# Patient Record
Sex: Female | Born: 1960
Health system: Southern US, Community
[De-identification: ages and names within clinical notes are randomized; demographics above are authoritative.]

## PROBLEM LIST (undated history)

## (undated) DIAGNOSIS — F419 Anxiety disorder, unspecified: Secondary | ICD-10-CM

## (undated) DIAGNOSIS — D219 Benign neoplasm of connective and other soft tissue, unspecified: Secondary | ICD-10-CM

## (undated) DIAGNOSIS — F329 Major depressive disorder, single episode, unspecified: Secondary | ICD-10-CM

## (undated) DIAGNOSIS — M1990 Unspecified osteoarthritis, unspecified site: Secondary | ICD-10-CM

## (undated) DIAGNOSIS — F32A Depression, unspecified: Secondary | ICD-10-CM

## (undated) DIAGNOSIS — R519 Headache, unspecified: Secondary | ICD-10-CM

## (undated) DIAGNOSIS — E669 Obesity, unspecified: Secondary | ICD-10-CM

## (undated) DIAGNOSIS — N189 Chronic kidney disease, unspecified: Secondary | ICD-10-CM

## (undated) DIAGNOSIS — E785 Hyperlipidemia, unspecified: Secondary | ICD-10-CM

## (undated) DIAGNOSIS — J45909 Unspecified asthma, uncomplicated: Secondary | ICD-10-CM

## (undated) DIAGNOSIS — K219 Gastro-esophageal reflux disease without esophagitis: Secondary | ICD-10-CM

## (undated) DIAGNOSIS — A599 Trichomoniasis, unspecified: Secondary | ICD-10-CM

## (undated) DIAGNOSIS — G629 Polyneuropathy, unspecified: Secondary | ICD-10-CM

## (undated) DIAGNOSIS — R109 Unspecified abdominal pain: Secondary | ICD-10-CM

## (undated) DIAGNOSIS — K589 Irritable bowel syndrome without diarrhea: Secondary | ICD-10-CM

## (undated) DIAGNOSIS — A749 Chlamydial infection, unspecified: Secondary | ICD-10-CM

## (undated) DIAGNOSIS — D126 Benign neoplasm of colon, unspecified: Secondary | ICD-10-CM

## (undated) DIAGNOSIS — E119 Type 2 diabetes mellitus without complications: Secondary | ICD-10-CM

## (undated) HISTORY — DX: Unspecified asthma, uncomplicated: J45.909

## (undated) HISTORY — PX: TUBAL LIGATION: SHX77

## (undated) HISTORY — DX: Gastro-esophageal reflux disease without esophagitis: K21.9

## (undated) HISTORY — DX: Morbid (severe) obesity due to excess calories: E66.01

## (undated) HISTORY — DX: Obesity, unspecified: E66.9

## (undated) HISTORY — DX: Irritable bowel syndrome, unspecified: K58.9

## (undated) HISTORY — DX: Hyperlipidemia, unspecified: E78.5

## (undated) HISTORY — DX: Trichomoniasis, unspecified: A59.9

## (undated) HISTORY — DX: Unspecified osteoarthritis, unspecified site: M19.90

## (undated) HISTORY — PX: UPPER GASTROINTESTINAL ENDOSCOPY: SHX188

## (undated) HISTORY — DX: Benign neoplasm of colon, unspecified: D12.6

## (undated) HISTORY — DX: Chlamydial infection, unspecified: A74.9

## (undated) HISTORY — DX: Unspecified abdominal pain: R10.9

---
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
## 1988-03-24 HISTORY — PX: COLPOSCOPY: SHX161

## 1997-03-24 DIAGNOSIS — K7689 Other specified diseases of liver: Secondary | ICD-10-CM

## 1997-03-24 HISTORY — DX: Other specified diseases of liver: K76.89

## 1998-01-24 ENCOUNTER — Inpatient Hospital Stay (HOSPITAL_COMMUNITY): Admission: AD | Admit: 1998-01-24 | Discharge: 1998-01-26 | Payer: Self-pay | Admitting: Specialist

## 1998-03-15 ENCOUNTER — Ambulatory Visit (HOSPITAL_COMMUNITY): Admission: RE | Admit: 1998-03-15 | Discharge: 1998-03-15 | Payer: Self-pay | Admitting: Gastroenterology

## 1998-03-15 ENCOUNTER — Encounter: Payer: Self-pay | Admitting: Gastroenterology

## 1998-06-27 ENCOUNTER — Other Ambulatory Visit: Admission: RE | Admit: 1998-06-27 | Discharge: 1998-06-27 | Payer: Self-pay | Admitting: Obstetrics and Gynecology

## 1998-07-19 ENCOUNTER — Encounter: Payer: Self-pay | Admitting: Gastroenterology

## 1998-07-19 ENCOUNTER — Ambulatory Visit (HOSPITAL_COMMUNITY): Admission: RE | Admit: 1998-07-19 | Discharge: 1998-07-19 | Payer: Self-pay | Admitting: Gastroenterology

## 1999-03-11 ENCOUNTER — Ambulatory Visit (HOSPITAL_COMMUNITY): Admission: RE | Admit: 1999-03-11 | Discharge: 1999-03-11 | Payer: Self-pay | Admitting: Gastroenterology

## 1999-03-11 ENCOUNTER — Encounter: Payer: Self-pay | Admitting: Gastroenterology

## 2000-04-13 ENCOUNTER — Ambulatory Visit (HOSPITAL_COMMUNITY): Admission: RE | Admit: 2000-04-13 | Discharge: 2000-04-13 | Payer: Self-pay | Admitting: Gastroenterology

## 2000-04-13 ENCOUNTER — Encounter: Payer: Self-pay | Admitting: Gastroenterology

## 2000-06-29 ENCOUNTER — Other Ambulatory Visit: Admission: RE | Admit: 2000-06-29 | Discharge: 2000-06-29 | Payer: Self-pay | Admitting: Obstetrics and Gynecology

## 2001-11-23 ENCOUNTER — Other Ambulatory Visit: Admission: RE | Admit: 2001-11-23 | Discharge: 2001-11-23 | Payer: Self-pay | Admitting: Obstetrics and Gynecology

## 2001-12-21 ENCOUNTER — Encounter: Payer: Self-pay | Admitting: Obstetrics and Gynecology

## 2001-12-21 ENCOUNTER — Ambulatory Visit (HOSPITAL_COMMUNITY): Admission: RE | Admit: 2001-12-21 | Discharge: 2001-12-21 | Payer: Self-pay | Admitting: Obstetrics and Gynecology

## 2002-08-02 ENCOUNTER — Ambulatory Visit: Admission: RE | Admit: 2002-08-02 | Discharge: 2002-08-02 | Payer: Self-pay | Admitting: Gastroenterology

## 2002-08-02 ENCOUNTER — Encounter: Payer: Self-pay | Admitting: Gastroenterology

## 2002-08-02 DIAGNOSIS — K769 Liver disease, unspecified: Secondary | ICD-10-CM | POA: Insufficient documentation

## 2004-01-17 ENCOUNTER — Ambulatory Visit: Payer: Self-pay | Admitting: Internal Medicine

## 2004-01-29 ENCOUNTER — Ambulatory Visit: Payer: Self-pay | Admitting: Internal Medicine

## 2004-02-27 ENCOUNTER — Ambulatory Visit: Payer: Self-pay | Admitting: Internal Medicine

## 2004-03-04 ENCOUNTER — Ambulatory Visit: Payer: Self-pay | Admitting: *Deleted

## 2004-04-04 ENCOUNTER — Ambulatory Visit: Payer: Self-pay | Admitting: Internal Medicine

## 2004-04-29 ENCOUNTER — Ambulatory Visit: Payer: Self-pay | Admitting: Internal Medicine

## 2004-05-08 ENCOUNTER — Ambulatory Visit: Payer: Self-pay | Admitting: Internal Medicine

## 2004-05-13 ENCOUNTER — Ambulatory Visit: Payer: Self-pay | Admitting: Internal Medicine

## 2004-05-20 ENCOUNTER — Ambulatory Visit: Payer: Self-pay | Admitting: Internal Medicine

## 2004-07-16 ENCOUNTER — Ambulatory Visit: Payer: Self-pay | Admitting: Internal Medicine

## 2004-10-23 ENCOUNTER — Ambulatory Visit: Payer: Self-pay | Admitting: Internal Medicine

## 2004-12-06 ENCOUNTER — Ambulatory Visit (HOSPITAL_COMMUNITY): Admission: RE | Admit: 2004-12-06 | Discharge: 2004-12-06 | Payer: Self-pay | Admitting: Internal Medicine

## 2005-01-30 ENCOUNTER — Ambulatory Visit: Payer: Self-pay | Admitting: Internal Medicine

## 2005-04-22 ENCOUNTER — Emergency Department (HOSPITAL_COMMUNITY): Admission: EM | Admit: 2005-04-22 | Discharge: 2005-04-22 | Payer: Self-pay | Admitting: Family Medicine

## 2005-07-08 ENCOUNTER — Encounter: Admission: RE | Admit: 2005-07-08 | Discharge: 2005-07-08 | Payer: Self-pay | Admitting: Occupational Medicine

## 2005-08-01 ENCOUNTER — Ambulatory Visit: Payer: Self-pay | Admitting: Internal Medicine

## 2005-08-12 ENCOUNTER — Ambulatory Visit: Payer: Self-pay | Admitting: Gastroenterology

## 2005-09-01 ENCOUNTER — Encounter (INDEPENDENT_AMBULATORY_CARE_PROVIDER_SITE_OTHER): Payer: Self-pay | Admitting: Internal Medicine

## 2005-09-01 ENCOUNTER — Ambulatory Visit: Payer: Self-pay | Admitting: Internal Medicine

## 2005-09-01 DIAGNOSIS — E7800 Pure hypercholesterolemia, unspecified: Secondary | ICD-10-CM | POA: Insufficient documentation

## 2005-09-01 LAB — CONVERTED CEMR LAB
Cholesterol: 202 mg/dL
Creatinine, Ser: 0.88 mg/dL
HDL: 65 mg/dL
Hemoglobin: 12.9 g/dL
LDL Cholesterol: 120 mg/dL
Triglycerides: 85 mg/dL

## 2005-09-17 ENCOUNTER — Ambulatory Visit: Payer: Self-pay | Admitting: Gastroenterology

## 2005-09-19 ENCOUNTER — Encounter: Payer: Self-pay | Admitting: Internal Medicine

## 2005-09-19 ENCOUNTER — Other Ambulatory Visit: Admission: RE | Admit: 2005-09-19 | Discharge: 2005-09-19 | Payer: Self-pay | Admitting: Internal Medicine

## 2005-09-19 ENCOUNTER — Ambulatory Visit: Payer: Self-pay | Admitting: Internal Medicine

## 2005-09-19 ENCOUNTER — Encounter (INDEPENDENT_AMBULATORY_CARE_PROVIDER_SITE_OTHER): Payer: Self-pay | Admitting: Internal Medicine

## 2005-09-19 LAB — CONVERTED CEMR LAB: Pap Smear: NORMAL

## 2005-10-13 ENCOUNTER — Ambulatory Visit: Payer: Self-pay | Admitting: Internal Medicine

## 2006-02-27 ENCOUNTER — Ambulatory Visit: Payer: Self-pay | Admitting: Internal Medicine

## 2006-05-02 ENCOUNTER — Emergency Department (HOSPITAL_COMMUNITY): Admission: EM | Admit: 2006-05-02 | Discharge: 2006-05-02 | Payer: Self-pay | Admitting: Family Medicine

## 2006-05-04 ENCOUNTER — Ambulatory Visit: Payer: Self-pay | Admitting: Internal Medicine

## 2006-08-25 ENCOUNTER — Ambulatory Visit: Payer: Self-pay | Admitting: Internal Medicine

## 2006-10-29 ENCOUNTER — Encounter (INDEPENDENT_AMBULATORY_CARE_PROVIDER_SITE_OTHER): Payer: Self-pay | Admitting: Internal Medicine

## 2006-10-29 DIAGNOSIS — F329 Major depressive disorder, single episode, unspecified: Secondary | ICD-10-CM | POA: Insufficient documentation

## 2006-10-29 DIAGNOSIS — F32A Depression, unspecified: Secondary | ICD-10-CM | POA: Insufficient documentation

## 2006-10-29 DIAGNOSIS — J309 Allergic rhinitis, unspecified: Secondary | ICD-10-CM | POA: Insufficient documentation

## 2006-10-29 DIAGNOSIS — K589 Irritable bowel syndrome without diarrhea: Secondary | ICD-10-CM | POA: Insufficient documentation

## 2006-10-29 DIAGNOSIS — F419 Anxiety disorder, unspecified: Secondary | ICD-10-CM | POA: Insufficient documentation

## 2006-10-29 DIAGNOSIS — F3289 Other specified depressive episodes: Secondary | ICD-10-CM | POA: Insufficient documentation

## 2006-11-30 ENCOUNTER — Telehealth (INDEPENDENT_AMBULATORY_CARE_PROVIDER_SITE_OTHER): Payer: Self-pay | Admitting: *Deleted

## 2006-12-09 ENCOUNTER — Encounter (INDEPENDENT_AMBULATORY_CARE_PROVIDER_SITE_OTHER): Payer: Self-pay | Admitting: *Deleted

## 2007-01-12 ENCOUNTER — Telehealth (INDEPENDENT_AMBULATORY_CARE_PROVIDER_SITE_OTHER): Payer: Self-pay | Admitting: *Deleted

## 2007-01-18 ENCOUNTER — Encounter (INDEPENDENT_AMBULATORY_CARE_PROVIDER_SITE_OTHER): Payer: Self-pay | Admitting: Internal Medicine

## 2007-02-02 ENCOUNTER — Ambulatory Visit: Payer: Self-pay | Admitting: Nurse Practitioner

## 2007-07-19 ENCOUNTER — Telehealth (INDEPENDENT_AMBULATORY_CARE_PROVIDER_SITE_OTHER): Payer: Self-pay | Admitting: Internal Medicine

## 2007-07-20 ENCOUNTER — Emergency Department (HOSPITAL_COMMUNITY): Admission: EM | Admit: 2007-07-20 | Discharge: 2007-07-20 | Payer: Self-pay | Admitting: Family Medicine

## 2007-07-27 ENCOUNTER — Ambulatory Visit: Payer: Self-pay | Admitting: Internal Medicine

## 2007-07-27 DIAGNOSIS — F319 Bipolar disorder, unspecified: Secondary | ICD-10-CM | POA: Insufficient documentation

## 2007-07-30 ENCOUNTER — Encounter (INDEPENDENT_AMBULATORY_CARE_PROVIDER_SITE_OTHER): Payer: Self-pay | Admitting: Internal Medicine

## 2007-08-03 ENCOUNTER — Telehealth (INDEPENDENT_AMBULATORY_CARE_PROVIDER_SITE_OTHER): Payer: Self-pay | Admitting: Internal Medicine

## 2007-09-03 ENCOUNTER — Ambulatory Visit: Payer: Self-pay | Admitting: Internal Medicine

## 2007-09-21 ENCOUNTER — Ambulatory Visit: Payer: Self-pay | Admitting: Internal Medicine

## 2008-08-30 ENCOUNTER — Ambulatory Visit: Payer: Self-pay | Admitting: Family Medicine

## 2008-08-30 DIAGNOSIS — K219 Gastro-esophageal reflux disease without esophagitis: Secondary | ICD-10-CM | POA: Insufficient documentation

## 2008-08-30 DIAGNOSIS — R42 Dizziness and giddiness: Secondary | ICD-10-CM | POA: Insufficient documentation

## 2008-10-13 ENCOUNTER — Ambulatory Visit: Payer: Self-pay | Admitting: Internal Medicine

## 2008-10-26 ENCOUNTER — Ambulatory Visit: Payer: Self-pay | Admitting: Internal Medicine

## 2008-10-26 LAB — CONVERTED CEMR LAB
ALT: <8 units/L (ref 0–35)
AST: 11 units/L (ref 0–37)
Albumin: 4.1 g/dL (ref 3.5–5.2)
Alkaline Phosphatase: 57 units/L (ref 39–117)
BUN: 13 mg/dL (ref 6–23)
Basophils Absolute: 0 10*3/uL (ref 0.0–0.1)
Basophils Relative: 0 % (ref 0–1)
CO2: 23 meq/L (ref 19–32)
Calcium: 9 mg/dL (ref 8.4–10.5)
Chloride: 104 meq/L (ref 96–112)
Cholesterol: 200 mg/dL (ref 0–200)
Creatinine, Ser: 0.77 mg/dL (ref 0.40–1.20)
Eosinophils Absolute: 0.1 10*3/uL (ref 0.0–0.7)
Eosinophils Relative: 2 % (ref 0–5)
Glucose, Bld: 99 mg/dL (ref 70–99)
HCT: 37.3 % (ref 36.0–46.0)
HDL: 61 mg/dL (ref >39)
Hemoglobin: 11.7 g/dL — ABNORMAL LOW (ref 12.0–15.0)
LDL Cholesterol: 119 mg/dL — ABNORMAL HIGH (ref 0–99)
Lymphocytes Relative: 46 % (ref 12–46)
Lymphs Abs: 2.3 10*3/uL (ref 0.7–4.0)
MCHC: 31.4 g/dL (ref 30.0–36.0)
MCV: 78.2 fL (ref 78.0–100.0)
Monocytes Absolute: 0.3 10*3/uL (ref 0.1–1.0)
Monocytes Relative: 5 % (ref 3–12)
Neutro Abs: 2.3 10*3/uL (ref 1.7–7.7)
Neutrophils Relative %: 47 % (ref 43–77)
Platelets: 233 10*3/uL (ref 150–400)
Potassium: 4 meq/L (ref 3.5–5.3)
RBC: 4.77 M/uL (ref 3.87–5.11)
RDW: 15.4 % (ref 11.5–15.5)
Sodium: 139 meq/L (ref 135–145)
TSH: 1.472 microintl units/mL (ref 0.350–4.500)
Total Bilirubin: 0.2 mg/dL — ABNORMAL LOW (ref 0.3–1.2)
Total CHOL/HDL Ratio: 3.3
Total Protein: 7.1 g/dL (ref 6.0–8.3)
Triglycerides: 98 mg/dL (ref <150)
VLDL: 20 mg/dL (ref 0–40)
WBC: 4.9 10*3/uL (ref 4.0–10.5)

## 2008-11-16 ENCOUNTER — Encounter (INDEPENDENT_AMBULATORY_CARE_PROVIDER_SITE_OTHER): Payer: Self-pay | Admitting: Internal Medicine

## 2008-11-16 DIAGNOSIS — D649 Anemia, unspecified: Secondary | ICD-10-CM | POA: Insufficient documentation

## 2009-02-06 ENCOUNTER — Ambulatory Visit: Payer: Self-pay | Admitting: Internal Medicine

## 2009-02-06 ENCOUNTER — Encounter (INDEPENDENT_AMBULATORY_CARE_PROVIDER_SITE_OTHER): Payer: Self-pay | Admitting: Internal Medicine

## 2009-02-06 DIAGNOSIS — G4700 Insomnia, unspecified: Secondary | ICD-10-CM | POA: Insufficient documentation

## 2009-02-06 DIAGNOSIS — A5901 Trichomonal vulvovaginitis: Secondary | ICD-10-CM | POA: Insufficient documentation

## 2009-02-06 LAB — CONVERTED CEMR LAB
Basophils Absolute: 0 10*3/uL (ref 0.0–0.1)
Basophils Relative: 1 % (ref 0–1)
Bilirubin Urine: NEGATIVE
Blood in Urine, dipstick: NEGATIVE
Chlamydia, DNA Probe: NEGATIVE
Eosinophils Absolute: 0.1 10*3/uL (ref 0.0–0.7)
Eosinophils Relative: 1 % (ref 0–5)
GC Probe Amp, Genital: NEGATIVE
Glucose, Urine, Semiquant: NEGATIVE
HCT: 37.9 % (ref 36.0–46.0)
Hemoglobin: 12.3 g/dL (ref 12.0–15.0)
Ketones, urine, test strip: NEGATIVE
Lymphocytes Relative: 46 % (ref 12–46)
Lymphs Abs: 3 10*3/uL (ref 0.7–4.0)
MCHC: 32.5 g/dL (ref 30.0–36.0)
MCV: 76.3 fL — ABNORMAL LOW (ref 78.0–100.0)
Monocytes Absolute: 0.5 10*3/uL (ref 0.1–1.0)
Monocytes Relative: 8 % (ref 3–12)
Neutro Abs: 2.9 10*3/uL (ref 1.7–7.7)
Neutrophils Relative %: 45 % (ref 43–77)
Nitrite: NEGATIVE
Platelets: 275 10*3/uL (ref 150–400)
RBC: 4.97 M/uL (ref 3.87–5.11)
RDW: 15.5 % (ref 11.5–15.5)
Rapid HIV Screen: NEGATIVE
Specific Gravity, Urine: 1.01
Urobilinogen, UA: 0.2
WBC: 6.4 10*3/uL (ref 4.0–10.5)
pH: 5.5

## 2009-02-17 ENCOUNTER — Emergency Department (HOSPITAL_COMMUNITY): Admission: EM | Admit: 2009-02-17 | Discharge: 2009-02-17 | Payer: Self-pay | Admitting: Emergency Medicine

## 2009-03-05 ENCOUNTER — Encounter (INDEPENDENT_AMBULATORY_CARE_PROVIDER_SITE_OTHER): Payer: Self-pay | Admitting: Internal Medicine

## 2009-03-08 ENCOUNTER — Telehealth (INDEPENDENT_AMBULATORY_CARE_PROVIDER_SITE_OTHER): Payer: Self-pay | Admitting: Internal Medicine

## 2009-03-19 ENCOUNTER — Telehealth (INDEPENDENT_AMBULATORY_CARE_PROVIDER_SITE_OTHER): Payer: Self-pay | Admitting: Internal Medicine

## 2009-06-19 ENCOUNTER — Encounter (INDEPENDENT_AMBULATORY_CARE_PROVIDER_SITE_OTHER): Payer: Self-pay | Admitting: Internal Medicine

## 2009-07-20 ENCOUNTER — Ambulatory Visit: Payer: Self-pay | Admitting: Internal Medicine

## 2009-07-20 ENCOUNTER — Telehealth (INDEPENDENT_AMBULATORY_CARE_PROVIDER_SITE_OTHER): Payer: Self-pay | Admitting: Internal Medicine

## 2009-07-20 DIAGNOSIS — R599 Enlarged lymph nodes, unspecified: Secondary | ICD-10-CM | POA: Insufficient documentation

## 2009-07-20 DIAGNOSIS — M25569 Pain in unspecified knee: Secondary | ICD-10-CM | POA: Insufficient documentation

## 2009-07-20 DIAGNOSIS — H6980 Other specified disorders of Eustachian tube, unspecified ear: Secondary | ICD-10-CM | POA: Insufficient documentation

## 2009-07-31 ENCOUNTER — Ambulatory Visit (HOSPITAL_COMMUNITY): Admission: RE | Admit: 2009-07-31 | Discharge: 2009-07-31 | Payer: Self-pay | Admitting: Internal Medicine

## 2009-08-06 ENCOUNTER — Encounter (INDEPENDENT_AMBULATORY_CARE_PROVIDER_SITE_OTHER): Payer: Self-pay | Admitting: Internal Medicine

## 2009-08-06 ENCOUNTER — Telehealth (INDEPENDENT_AMBULATORY_CARE_PROVIDER_SITE_OTHER): Payer: Self-pay | Admitting: Internal Medicine

## 2009-08-08 ENCOUNTER — Encounter (INDEPENDENT_AMBULATORY_CARE_PROVIDER_SITE_OTHER): Payer: Self-pay | Admitting: *Deleted

## 2009-08-15 ENCOUNTER — Telehealth (INDEPENDENT_AMBULATORY_CARE_PROVIDER_SITE_OTHER): Payer: Self-pay | Admitting: Internal Medicine

## 2009-10-19 ENCOUNTER — Encounter (INDEPENDENT_AMBULATORY_CARE_PROVIDER_SITE_OTHER): Payer: Self-pay | Admitting: Internal Medicine

## 2009-11-01 ENCOUNTER — Encounter (INDEPENDENT_AMBULATORY_CARE_PROVIDER_SITE_OTHER): Payer: Self-pay | Admitting: Internal Medicine

## 2009-12-20 ENCOUNTER — Telehealth (INDEPENDENT_AMBULATORY_CARE_PROVIDER_SITE_OTHER): Payer: Self-pay | Admitting: Internal Medicine

## 2010-01-04 ENCOUNTER — Ambulatory Visit: Payer: Self-pay | Admitting: Internal Medicine

## 2010-01-04 DIAGNOSIS — J019 Acute sinusitis, unspecified: Secondary | ICD-10-CM | POA: Insufficient documentation

## 2010-01-04 LAB — CONVERTED CEMR LAB
BUN: 11 mg/dL (ref 6–23)
CO2: 28 meq/L (ref 19–32)
Calcium: 9.8 mg/dL (ref 8.4–10.5)
Chloride: 103 meq/L (ref 96–112)
Creatinine, Ser: 0.85 mg/dL (ref 0.40–1.20)
Glucose, Bld: 106 mg/dL — ABNORMAL HIGH (ref 70–99)
Potassium: 4.4 meq/L (ref 3.5–5.3)
Sodium: 140 meq/L (ref 135–145)

## 2010-01-11 ENCOUNTER — Ambulatory Visit: Payer: Self-pay | Admitting: Internal Medicine

## 2010-01-11 ENCOUNTER — Telehealth (INDEPENDENT_AMBULATORY_CARE_PROVIDER_SITE_OTHER): Payer: Self-pay | Admitting: Internal Medicine

## 2010-01-11 DIAGNOSIS — L293 Anogenital pruritus, unspecified: Secondary | ICD-10-CM | POA: Insufficient documentation

## 2010-01-11 LAB — CONVERTED CEMR LAB
Bilirubin Urine: NEGATIVE
Blood in Urine, dipstick: NEGATIVE
Chlamydia, DNA Probe: NEGATIVE
GC Probe Amp, Genital: NEGATIVE
Glucose, Urine, Semiquant: NEGATIVE
KOH Prep: NEGATIVE
Nitrite: NEGATIVE
Specific Gravity, Urine: >=1.03
Urobilinogen, UA: 0.2
WBC Urine, dipstick: NEGATIVE
Whiff Test: NEGATIVE
pH: 5.5

## 2010-01-26 ENCOUNTER — Encounter (INDEPENDENT_AMBULATORY_CARE_PROVIDER_SITE_OTHER): Payer: Self-pay | Admitting: Internal Medicine

## 2010-02-11 ENCOUNTER — Ambulatory Visit: Payer: Self-pay | Admitting: Internal Medicine

## 2010-02-18 ENCOUNTER — Ambulatory Visit: Payer: Self-pay | Admitting: Obstetrics & Gynecology

## 2010-02-18 LAB — CONVERTED CEMR LAB
TSH: 2.231 microintl units/mL (ref 0.350–4.500)
Trich, Wet Prep: NONE SEEN

## 2010-03-01 ENCOUNTER — Ambulatory Visit: Payer: Self-pay | Admitting: Nurse Practitioner

## 2010-03-13 ENCOUNTER — Emergency Department (HOSPITAL_COMMUNITY)
Admission: EM | Admit: 2010-03-13 | Discharge: 2010-03-13 | Payer: Self-pay | Source: Home / Self Care | Admitting: Emergency Medicine

## 2010-03-15 ENCOUNTER — Ambulatory Visit (HOSPITAL_COMMUNITY)
Admission: RE | Admit: 2010-03-15 | Discharge: 2010-03-15 | Payer: Self-pay | Source: Home / Self Care | Attending: Family Medicine | Admitting: Family Medicine

## 2010-03-25 ENCOUNTER — Ambulatory Visit (HOSPITAL_COMMUNITY)
Admission: RE | Admit: 2010-03-25 | Discharge: 2010-03-25 | Payer: Self-pay | Source: Home / Self Care | Attending: Obstetrics & Gynecology | Admitting: Obstetrics & Gynecology

## 2010-04-04 ENCOUNTER — Observation Stay (HOSPITAL_COMMUNITY)
Admission: EM | Admit: 2010-04-04 | Discharge: 2010-04-04 | Payer: Self-pay | Source: Home / Self Care | Admitting: Emergency Medicine

## 2010-04-04 ENCOUNTER — Encounter (INDEPENDENT_AMBULATORY_CARE_PROVIDER_SITE_OTHER): Payer: Self-pay | Admitting: Emergency Medicine

## 2010-04-08 LAB — HEPATIC FUNCTION PANEL
ALT: 10 U/L (ref 0–35)
AST: 21 U/L (ref 0–37)
Albumin: 3.7 g/dL (ref 3.5–5.2)
Alkaline Phosphatase: 58 U/L (ref 39–117)
Bilirubin, Direct: 0.2 mg/dL (ref 0.0–0.3)
Indirect Bilirubin: 0.4 mg/dL (ref 0.3–0.9)
Total Bilirubin: 0.6 mg/dL (ref 0.3–1.2)
Total Protein: 7.3 g/dL (ref 6.0–8.3)

## 2010-04-08 LAB — POCT CARDIAC MARKERS
CKMB, poc: <1 ng/mL — ABNORMAL LOW (ref 1.0–8.0)
Myoglobin, poc: 70 ng/mL (ref 12–200)
Troponin i, poc: <0.05 ng/mL (ref 0.00–0.09)

## 2010-04-08 LAB — BASIC METABOLIC PANEL
BUN: 6 mg/dL (ref 6–23)
CO2: 26 mEq/L (ref 19–32)
Calcium: 8.9 mg/dL (ref 8.4–10.5)
Chloride: 102 mEq/L (ref 96–112)
Creatinine, Ser: 0.93 mg/dL (ref 0.4–1.2)
GFR calc Af Amer: >60 mL/min (ref >60)
GFR calc non Af Amer: >60 mL/min (ref >60)
Glucose, Bld: 106 mg/dL — ABNORMAL HIGH (ref 70–99)
Potassium: 3.8 mEq/L (ref 3.5–5.1)
Sodium: 138 mEq/L (ref 135–145)

## 2010-04-08 LAB — CBC
HCT: 37.1 % (ref 36.0–46.0)
Hemoglobin: 12.3 g/dL (ref 12.0–15.0)
MCH: 25.7 pg — ABNORMAL LOW (ref 26.0–34.0)
MCHC: 33.2 g/dL (ref 30.0–36.0)
MCV: 77.6 fL — ABNORMAL LOW (ref 78.0–100.0)
Platelets: 250 10*3/uL (ref 150–400)
RBC: 4.78 MIL/uL (ref 3.87–5.11)
RDW: 14.3 % (ref 11.5–15.5)
WBC: 8.2 10*3/uL (ref 4.0–10.5)

## 2010-04-08 LAB — CK TOTAL AND CKMB (NOT AT ARMC)
CK, MB: 0.7 ng/mL (ref 0.3–4.0)
CK, MB: 0.7 ng/mL (ref 0.3–4.0)
Relative Index: INVALID (ref 0.0–2.5)
Relative Index: INVALID (ref 0.0–2.5)
Total CK: 64 U/L (ref 7–177)
Total CK: 65 U/L (ref 7–177)

## 2010-04-08 LAB — LIPASE, BLOOD: Lipase: 27 U/L (ref 11–59)

## 2010-04-08 LAB — TROPONIN I
Troponin I: 0.02 ng/mL (ref 0.00–0.06)
Troponin I: 0.03 ng/mL (ref 0.00–0.06)

## 2010-04-15 ENCOUNTER — Encounter: Payer: Self-pay | Admitting: Internal Medicine

## 2010-04-23 NOTE — Letter (Signed)
Summary: Generic Letter  [facility]  [address] [place], [place] [address]   Phone: [phone]  Fax: [phone]    08/08/2009  [name] [address] [place], [place]  [address]  Dear Ms. Bruening,  We have been unable to reach you by telephone.  In order to set up an appointment with the ENT, we need you to provide a working telephone number to our referral coordinator.    Please call Candi Leash at [phone].  Thank you for your attention to this matter.  Sincerely,   Dutch Quint RN

## 2010-04-23 NOTE — Assessment & Plan Note (Signed)
Summary: CPP EXAM///GK   Vital Signs:  Patient profile:   50 year old female Height:      69 inches Weight:      201 pounds BMI:     29.79 Temp:     97.8 degrees F oral Pulse rate:   101 / minute Pulse rhythm:   regular Resp:     22 per minute BP sitting:   115 / 83  (left arm) Cuff size:   regular  Vitals Entered By: Arm[place] Shannon (October 13, 2008 2:23 PM) CC: CPP....pt is concerned about dizzy spells she have been having..... Is Patient Diabetic? No Pain Assessment Patient in pain? no       Does patient need assistance? Functional Status Self care Ambulation Normal   CC:  CPP....pt is concerned about dizzy spells she have been having.....Marland Kitchen  History of Present Illness: 50 yo female here for CPP.   Concerns: 1.  Currently menstruating and flow is heavy.  2.  Concern for Diabetes.  Significant family history. Pt. states thirsty and dizzy a lot.    3.  Fibroids--having heavy periods--wonders if dizziness could be from this as well.  4.  Depression:  Hasn't come to terms with probable need to take meds for rest of life.  Discussed many health concerns need chronic medication.    Allergies (verified): No Known Drug Allergies  Past History:  Past Medical History: Reviewed history from 10/29/2006 and no changes required. HYPERCHOLESTEROLEMIA, MILD (ICD-272.0) DISORDER, LIVER NOS (ICD-573.9) ALLERGIC RHINITIS (ICD-477.9) IRRITABLE BOWEL SYNDROME (ICD-564.1) ANXIETY STATE NOS (ICD-300.00) DISORDER, DEPRESSIVE NEC (ICD-311)  Past Surgical History: Reviewed history from 10/29/2006 and no changes required. 1.  1989  Colposcopy 2.. 08/2005  Colonoscopy  Family History: Mother, died 32:  unknown primary adenocarcinoma--peritoneal metastases.  Hypertension, DM, PE Father, 83:  DM, Unknown brain tumor--benign Three 1/2 Sisters:  1 with DM, 3 with fibroids, 1 with Hypertension.  All younger. Son, 26:  Healthy Daughter, 18:  Healthy  Social History: Divorced Lives  at home alone. Daughter at [employer] Currently unemployed--in school on line--psychology. Tobacco Use:  none. Alcohol Use:  rare. Drug Use:  none  Review of Systems General:  Energy fair.. Eyes:  Bifocals/reading glasses. ENT:  Denies decreased hearing. CV:  Denies chest pain or discomfort and palpitations. Resp:  Complains of shortness of breath; Climbing stairs.Marland Kitchen GI:  Denies bloody stools and dark tarry stools; Lots of gas.. GU:  Complains of urinary frequency; denies discharge; periods always heavy. MS:  Sharp pain lateral upper thighs for years.. Derm:  Denies lesion(s) and rash. Neuro:  Denies weakness; Left hand gets numb recently--on computer a lot. Psych:  See HPI--followed by psych.  Physical Exam  General:  Well-developed,well-nourished,in no acute distress; alert,appropriate and cooperative throughout examination Head:  Normocephalic and atraumatic without obvious abnormalities. No apparent alopecia or balding. Eyes:  No corneal or conjunctival inflammation noted. EOMI. Perrla. Funduscopic exam benign, without hemorrhages, exudates or papilledema. Vision grossly normal. Ears:  External ear exam shows no significant lesions or deformities.  Otoscopic examination reveals clear canals, tympanic membranes are intact bilaterally without bulging, retraction, inflammation or discharge. Hearing is grossly normal bilaterally. Nose:  External nasal examination shows no deformity or inflammation. Nasal mucosa are pink and moist without lesions or exudates. Mouth:  Oral mucosa and oropharynx without lesions or exudates.  Teeth in good repair. Neck:  No deformities, masses, or tenderness noted. Breasts:  No mass, nodules, thickening, tenderness, bulging, retraction, inflamation, nipple discharge or skin changes  noted.   Lungs:  Normal respiratory effort, chest expands symmetrically. Lungs are clear to auscultation, no crackles or wheezes. Heart:  Normal rate and regular rhythm. S1  and S2 normal without gallop, murmur, click, rub or other extra sounds. Abdomen:  Bowel sounds positive,abdomen soft and non-tender without masses, organomegaly or hernias noted. Rectal:  To return for genital/rectal exam Genitalia:  To return for genital/rectal exam--currently menstruating heavily Msk:  No deformity or scoliosis noted of thoracic or lumbar spine.   Pulses:  R and L carotid,radial,femoral,dorsalis pedis and posterior tibial pulses are full and equal bilaterally Extremities:  No clubbing, cyanosis, edema, or deformity noted with normal full range of motion of all joints.   Neurologic:  No cranial nerve deficits noted. Station and gait are normal. Plantar reflexes are down-going bilaterally. DTRs are symmetrical throughout. Sensory, motor and coordinative functions appear intact. Skin:  Intact without suspicious lesions or rashes Cervical Nodes:  No lymphadenopathy noted Axillary Nodes:  No palpable lymphadenopathy Inguinal Nodes:  No significant adenopathy Psych:  Cognition and judgment appear intact. Alert and cooperative with normal attention span and concentration. No apparent delusions, illusions, hallucinations   Impression & Recommendations:  Problem # 1:  ROUTINE GYNECOLOGICAL EXAMINATION (ICD-V72.31) Schedule mammogram To return for pap/pelvic/rectal exam--heavy menstruation today.  Problem # 2:  HEALTH MAINTENANCE EXAM (ICD-V70.0) Guaiac Cards x 3 -- to return in 2 weeks  Problem # 3:  POSSIBLE EXERCISE INDUCED ASTHMA (ICD-493.81) Try albuterol MDI with dyspnea after exercise and also to try before--to call results. The following medications were removed from the medication list:    Singulair 10 Mg Tabs (Montelukast sodium) .Marland Kitchen... 1 tab by mouth daily Her updated medication list for this problem includes:    Proventil Hfa 108 (90 Base) Mcg/act Aers (Albuterol sulfate) .Marland Kitchen... 2 puffs every 4 hours as needed for dyspnea  Complete Medication List: 1)  Nasacort Aq  55 Mcg/act Aers (Triamcinolone acetonide(nasal)) .... 2 spray each nostril daily 2)  Protonix 40 Mg Tbec (Pantoprazole sodium) .Marland Kitchen.. 1 tab by mouth daily 3)  Antivert 25 Mg Tabs (Meclizine hcl) .... Take one half pill every 6 hours as needed for dizziness 4)  Cymbalta 60 Mg Cpep (Duloxetine hcl) .Marland Kitchen.. 1 cap with 30 mg cap daily 5)  Cymbalta 30 Mg Cpep (Duloxetine hcl) .Marland Kitchen.. 1 cap daily with 60 mg cap [facility] 6)  Clonazepam 0.5 Mg Tabs (Clonazepam) .Marland Kitchen.. 1 tab by mouth two times a day as needed anxiety--[facility] 7)  Proventil Hfa 108 (90 Base) Mcg/act Aers (Albuterol sulfate) .... 2 puffs every 4 hours as needed for dyspnea  Patient Instructions: 1)  Pap smear only with Dr. Delrae Alfred in next month 2)  Fasting lab appt. at least 2 days prior to Pap smear appt--FLP, CMET, CBC, TSH. 3)  Bring in Guaiac cards to lab appt.  Preventive Care Screening  Prior Values:    Pap Smear:  Normal (09/19/2005)    Colonoscopy:  [facility] - Dr. Claudette Head  Normal exam (09/17/2005)    Last Tetanus Booster:  Td (08/01/2005)     Last Mammogram:  2004-normal LMP:  started 10/11/08--still menstruating Osteoprevention:  One serving daily.  No exercise.   Guaiac Cards:  distant past.  Never positive.   Prescriptions: PROVENTIL HFA 108 (90 BASE) MCG/ACT AERS (ALBUTEROL SULFATE) 2 puffs every 4 hours as needed for dyspnea  #1 x 0   Entered and Authorized by:   Julieanne Manson MD   Signed by:   Julieanne Manson MD on  10/13/2008   Method used:   Print then Give to Patient   RxID:   [id number] NASACORT AQ 55 MCG/ACT  AERS (TRIAMCINOLONE ACETONIDE(NASAL)) 2 spray each nostril daily  #1 x 11   Entered and Authorized by:   Julieanne Manson MD   Signed by:   Julieanne Manson MD on 10/13/2008   Method used:   Print then Give to Patient   RxID:   [id number]

## 2010-04-23 NOTE — Progress Notes (Signed)
   Phone Note Call from Patient Call back at [facility] Phone [phone]   Summary of Call: The pt wants to know if the provider can give her som samplses of cymbalta (depression med) because the [facility] won't be open until tomorrow. Dejha King MD Initial call taken by: Manon Hilding,  March 19, 2009 11:34 AM  Follow-up for Phone Call        Pt aware we do not have samples and will just get her med filled tomorrow. Follow-up by: Vesta Mixer CMA,  March 19, 2009 11:48 AM

## 2010-04-23 NOTE — Assessment & Plan Note (Signed)
Summary: PAP ONLY//GK   Vital Signs:  Patient profile:   50 year old female Menstrual status:  regular LMP:     01/27/2009 Weight:      201.7 pounds Temp:     97.1 degrees F oral Pulse rate:   88 / minute Pulse rhythm:   regular Resp:     20 per minute BP sitting:   130 / 90  (left arm) Cuff size:   regular  Vitals Entered By: Gaylyn Cheers RN (February 06, 2009 11:56 AM) CC: pt here for PAP Is Patient Diabetic? No Pain Assessment Patient in pain? no       Does patient need assistance? Functional Status Self care Ambulation Normal Comments also take OTC Sleep aid has been prescribed something by mental health but as not gotten it filled yet LMP (date): 01/27/2009 LMP - Character: normal     Menstrual Status regular Enter LMP: 01/27/2009 Last PAP Result Normal   CC:  pt here for PAP.  History of Present Illness: 1.  Insomnia:  Has problems initiating and staying asleep.  Using generic "Sleep Aide" from [employer].   Was given Rx for Rozerem from [facility].  Does not have a set bedtime or wake time.  2.  Here today to complete physical--needs pap and pelvic.  Still needs to complete stool cards--has another packet.  No problems with periods.  Still regular--is having some hot flashes and night sweats.  Not interested in ERT.   3.  Pt. brings up that her mother died of what sounds like peritoneal adenocarinomatosis with unknown primary.  She wanted to know if she should get a CA 125.  Mother had normal ovaries on CT per pt's report, the description of which apparently was brought in to office in 2007--note from Dr. Reche Dixon stating path could have been consistent with breast, lung, ovarian or colon source.  Pt. has had a colonoscopy since and was told to follow up in 2012 with Dr. Russella Dar.  She still has not gone for Mammogram--cancelled the appt. we set up for her and then cancelled the remake.   Allergies (verified): No Known Drug Allergies  Physical Exam   Rectal:  No external abnormalities noted. Normal sphincter tone. No rectal masses or tenderness.  Heme negative light brown stool. Genitalia:  Pelvic Exam:        External: normal female genitalia without lesions or masses        Vagina: Inflammation of vaginal mucosa with frothy yellow discharge.        Cervix: Mildly inflammed without other lesions        Adnexa: normal bimanual exam without masses or fullness        Uterus: normal by palpation        Pap smear: performed   Impression & Recommendations:  Problem # 1:  INSOMNIA (ICD-780.52) Long discussion regarding better sleep hygiene, exercise, sunlight exposure--getting some sort of schedule to her day. To stop watching TV so much as well-especially at night. Encouraged her to try Rozerem  Problem # 2:  ROUTINE GYNECOLOGICAL EXAMINATION (ICD-V72.31) Discussed pt's concern for ovarian cancer.  She had an MRI of abdomen and pelvis in 2007 for other reasons that did not show any abnormality of her ovaries.  Discussed to continue having exams and how important getting her mammogram and getting stool cards in is as well.   Orders: T-Syphilis Test (RPR) [phone]) T-Pap Smear, Thin Prep (09811) Pap Smear, Thin Prep ( Collection of) (B1478) KOH/  WET Mount [id number]) T-HIV Antibody  (Reflex) [id number]) T- GC Chlamydia (93235)  Problem # 3:  TRICHOMONAL VAGINITIS (ICD-131.01) Metronidazole 2 g by mouth --to notify partner--pt. states she had not been with someone for over a month  Problem # 4:  Preventive Health Care (ICD-V70.0) Flu vaccine  Problem # 5:  ANEMIA, MILD (ICD-285.9)  Her updated medication list for this problem includes:    Ferrous Sulfate 325 (65 Fe) Mg Tabs (Ferrous sulfate) .Marland Kitchen... 1 tab by mouth daily  Orders: T-CBC w/Diff ([id number])  Complete Medication List: 1)  Nasacort Aq 55 Mcg/act Aers (Triamcinolone acetonide(nasal)) .... 2 spray each nostril daily 2)  Aciphex 20 Mg Tbec (Rabeprazole sodium) .Marland Kitchen.. 1  tab by mouth 1/2 hour before morning meal on empty stomach 3)  Antivert 25 Mg Tabs (Meclizine hcl) .... Take one half pill every 6 hours as needed for dizziness 4)  Cymbalta 60 Mg Cpep (Duloxetine hcl) .Marland Kitchen.. 1 cap with 30 mg cap daily 5)  Cymbalta 30 Mg Cpep (Duloxetine hcl) .Marland Kitchen.. 1 cap daily with 60 mg cap [facility] 6)  Clonazepam 0.5 Mg Tabs (Clonazepam) .Marland Kitchen.. 1 tab by mouth two times a day as needed anxiety--[facility] 7)  Proventil Hfa 108 (90 Base) Mcg/act Aers (Albuterol sulfate) .... 2 puffs every 4 hours as needed for dyspnea 8)  Ferrous Sulfate 325 (65 Fe) Mg Tabs (Ferrous sulfate) .Marland Kitchen.. 1 tab by mouth daily 9)  Metronidazole 500 Mg Tabs (Metronidazole) .... 4 tabs by mouth for one dose  Other Orders: Flu Vaccine 49yrs + (70623) Admin 1st Vaccine (76283) Admin 1st Vaccine [facility]) [phone])  Patient Instructions: 1)  Get stool cards in and Mammogram done. Prescriptions: METRONIDAZOLE 500 MG TABS (METRONIDAZOLE) 4 tabs by mouth for one dose  #4 x 0   Entered and Authorized by:   Julieanne Manson MD   Signed by:   Julieanne Manson MD on 02/06/2009   Method used:   Electronically to        [facility] [address] (retail)       [phone]       [place], [place]  [address]       Ph: [phone]       Fax: [phone]   RxID:   [id number]    Influenza Vaccine    Vaccine Type: Fluvax 3+    Site: left deltoid    Mfr: Sanofi Pasteur    Dose: 0.5 ml    Route: IM    Given by: Gaylyn Cheers RN    Exp. Date: 09/20/2008    Lot #: [id number]    VIS given: 10/15/06 version given February 06, 2009.  Flu Vaccine Consent Questions    Do you have a history of severe allergic reactions to this vaccine? no    Any prior history of allergic reactions to egg and/or gelatin? no    Do you have a sensitivity to the preservative Thimersol? no    Do you have a past history of Guillan-Barre Syndrome? no    Do you currently have an acute febrile  illness? no    Have you ever had a severe reaction to latex? no    Vaccine information given and explained to patient? no    Are you currently pregnant? no   Laboratory Results   Urine Tests  Date/Time Received: February 06, 2009 12:41 PM  Date/Time Reported: February 06, 2009 12:41 PM   Routine Urinalysis   Glucose: negative   (  Normal Range: Negative) Bilirubin: negative   (Normal Range: Negative) Ketone: negative   (Normal Range: Negative) Spec. Gravity: 1.010   (Normal Range: 1.003-1.035) Blood: negative   (Normal Range: Negative) pH: 5.5   (Normal Range: 5.0-8.0) Protein: trace   (Normal Range: Negative) Urobilinogen: 0.2   (Normal Range: 0-1) Nitrite: negative   (Normal Range: Negative) Leukocyte Esterace: trace   (Normal Range: Negative)      Other Tests  Rapid HIV: negative       Preventive Care Screening  Colonoscopy:    Next Due:  09/2010  Last Flu Shot:    Date:  02/06/2009    Results:  Fluvax 3+    Appended Document: PAP ONLY//GK     Allergies: No Known Drug Allergies   Complete Medication List: 1)  Nasacort Aq 55 Mcg/act Aers (Triamcinolone acetonide(nasal)) .... 2 spray each nostril daily 2)  Aciphex 20 Mg Tbec (Rabeprazole sodium) .Marland Kitchen.. 1 tab by mouth 1/2 hour before morning meal on empty stomach 3)  Antivert 25 Mg Tabs (Meclizine hcl) .... Take one half pill every 6 hours as needed for dizziness 4)  Cymbalta 60 Mg Cpep (Duloxetine hcl) .Marland Kitchen.. 1 cap with 30 mg cap daily 5)  Cymbalta 30 Mg Cpep (Duloxetine hcl) .Marland Kitchen.. 1 cap daily with 60 mg cap [facility] 6)  Clonazepam 0.5 Mg Tabs (Clonazepam) .Marland Kitchen.. 1 tab by mouth two times a day as needed anxiety--[facility] 7)  Proventil Hfa 108 (90 Base) Mcg/act Aers (Albuterol sulfate) .... 2 puffs every 4 hours as needed for dyspnea 8)  Ferrous Sulfate 325 (65 Fe) Mg Tabs (Ferrous sulfate) .Marland Kitchen.. 1 tab by mouth daily 9)  Metronidazole 500 Mg Tabs (Metronidazole) .... 4 tabs by mouth for one dose    Laboratory Results    Wet Mount/KOH Source: vaginal WBC/hpf: 5-10 Bacteria/hpf: 1+ Clue cells/hpf: few  Positive whiff Yeast/hpf: none Trichomonas/hpf: many

## 2010-04-23 NOTE — Letter (Signed)
Summary: LETTER/FROM PT//CONCERNING CARE  LETTER/FROM PT//CONCERNING CARE   Imported By: Arta Bruce 06/19/2009 11:59:56  _____________________________________________________________________  External Attachment:    Type:   Image     Comment:   External Document  Appended Document: LETTER/FROM PT//CONCERNING CARE/  hemmoccult results     Clinical Lists Changes  Medications: Rx of NASACORT AQ 55 MCG/ACT  AERS (TRIAMCINOLONE ACETONIDE(NASAL)) 2 spray each nostril daily;  #1 x 11;  Signed;  Entered by: Julieanne Manson MD;  Authorized by: Julieanne Manson MD;  Method used: Faxed to [facility], [address], [place], [place]  [address], Ph: [phone], Fax: [phone] Rx of ACIPHEX 20 MG TBEC (RABEPRAZOLE SODIUM) 1 tab by mouth 1/2 hour before morning meal on empty stomach;  #30 x 11;  Signed;  Entered by: Julieanne Manson MD;  Authorized by: Julieanne Manson MD;  Method used: Faxed to [facility], [address], [place], [place]  [address], Ph: [phone], Fax: [phone] Rx of FERROUS SULFATE 325 (65 FE) MG TABS (FERROUS SULFATE) 1 tab by mouth daily;  #30 x 11;  Signed;  Entered by: Julieanne Manson MD;  Authorized by: Julieanne Manson MD;  Method used: Faxed to [facility], [address] [place], [place]  [address], Ph: [phone], Fax: [phone] Observations: Added new observation of HEMOCCULT 2: negative (06/19/2009 17:48) Added new observation of HEMOCCULT 3: negative (06/19/2009 17:48) Added new observation of HEMOCCULT 1: negative (06/19/2009 17:48)  Called and spoke with pt.  Still has not rescheduled mammogram--having difficulty finding the time to go.  Apparently has rescheduled more than once.  Julieanne Manson MD  July 02, 2009 12:40 PM  Prescriptions: FERROUS SULFATE 325 (65 FE) MG TABS (FERROUS SULFATE) 1 tab by  mouth daily  #30 x 11   Entered and Authorized by:   Julieanne Manson MD   Signed by:   Julieanne Manson MD on 07/02/2009   Method used:   Faxed to ...       [facility] (retail)       [address] [place], [place]  [address]       Ph: [phone]       Fax: [phone]   RxID:   [id number] ACIPHEX 20 MG TBEC (RABEPRAZOLE SODIUM) 1 tab by mouth 1/2 hour before morning meal on empty stomach  #30 x 11   Entered and Authorized by:   Julieanne Manson MD   Signed by:   Julieanne Manson MD on 07/02/2009   Method used:   Faxed to ...       [facility] (retail)       [address] [place], [place]  [address]       Ph: [phone]       Fax: [phone]   RxID:   [id number] NASACORT AQ 55 MCG/ACT  AERS (TRIAMCINOLONE ACETONIDE(NASAL)) 2 spray each nostril daily  #1 x 11   Entered and Authorized by:   Julieanne Manson MD   Signed by:   Julieanne Manson MD on 07/02/2009   Method used:   Faxed to ...       [facility] (retail)       [address] [place], [place]  [address]       Ph: [phone]  Fax: [phone]   RxID:   [id number]    Laboratory Results  Date/Time Received: June 19, 2009 5:49 PM   Stool - Occult Blood Hemmoccult #1: negative Date: 06/19/2009 Hemoccult #2: negative Date: 06/19/2009 Hemoccult #3: negative Date: 06/19/2009

## 2010-04-23 NOTE — Assessment & Plan Note (Signed)
Summary: panic attacks /tmm   Vital Signs:  Patient Profile:   50 Years Old Female LMP:     07/08/2007 Height:     69 inches Weight:      201 pounds Temp:     97.9 degrees F oral Pulse rate:   84 / minute Pulse rhythm:   regular Resp:     20 per minute BP sitting:   130 / 76  (right arm) Cuff size:   regular  Pt. in pain?   no    Location:   head  Vitals Entered By: Verdis Prime SMA (Jul 27, 2007 3:07 PM)  Menstrual History: LMP (date): 07/08/2007 LMP - Character: normal              Is Patient Diabetic? No  Does patient need assistance? Functional Status Self care Ambulation Normal     Chief Complaint:  pt went to Urgent care last Tuesday, printed off report, pt would like to be checked for diabeties because her sister was just diagnoised, pt been experiencing panic attacks, and bad mood swings, pt hates her job and dreads going there, dreads going home nothing makes her happy, and .  History of Present Illness: 1.  Depression for 12 years.  Now getting in way of work and family.  Therapist--Kathy  Kirshner, through [employer],  feels she needs to go up on dose of Effexor.    Has been seeing therapist since last fall, though had several months without transportation, so did not go during that time.  Bad mood swings at work.  Last time saw a psychiatrist was in 2003--Dr. [name].  Did well with her.  Unable to find a psychiatrist that carries Medicaid.  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    Has been on Prozac and Wellbutrin in the past.  Has been on Wellbutrin in combination with Effexor or Prozac in past.  Seemed to work well for a while, but thinks she stopped WellbutrinHaving problems with anxiety attacks, but also wants to sleep all the time, despite adequate sleep.Does have episodes where is up and can go days without sleep.  Has had multiple episodes with big  plans that fall through--often involving money.  Denies active suicidal ideation.    Current Allergies: No known allergies       Physical Exam  General:     Makes eye contact.  Depressed appearing mood however.    Impression & Recommendations:  Problem # 1:  BIPOLAR DISORDER UNSPECIFIED (ICD-296.80) Referral to Darliss Ridgel work into schedule this Friday. Needs to get set up with [facility] most likely and to continue with counseling. To follow  up with me in 2 months. Orders: Psychiatric Referral (Psych)   Complete Medication List: 1)  Effexor Xr 75 Mg Cp24 (Venlafaxine hcl) .... 3 tablets by mouth daily for mood 2)  Hyoscyamine Sulfate 0.375 Mg Tb12 (Hyoscyamine sulfate) .Marland Kitchen.. 1 tab by mouth two times a day prn 3)  Allegra-d 12 Hour 60-120 Mg Tb12 (Fexofenadine-pseudoephedrine) .Marland Kitchen.. 1 tab by mouth two times a day as needed allergies 4)  Nasacort Aq 55 Mcg/act Aers (Triamcinolone acetonide(nasal)) .... 2 spray each nostril daily 5)  Singulair 10 Mg Tabs (Montelukast sodium) .Marland Kitchen.. 1 tab by mouth daily 6)  Protonix 40 Mg Tbec (Pantoprazole sodium) .Marland Kitchen.. 1 tab by mouth daily   Patient Instructions: 1)  Appt. with Aquilla Solian this Friday--book for 8:30 a.m., but  pt. to come at 9:30 (This per Ms. Alessandra Bevels)   ]  Appended Document: panic attacks /tmm Tiffany Pt. requested a glucose level at end of visit and I asked for a fingerstick--did that get done?  Appended Document: cbg  Laboratory Results   Blood Tests     CBG Random:: 104mg /dL

## 2010-04-23 NOTE — Letter (Signed)
Summary: *Referral Letter  [facility]  [address] [place], [place] [address]   Phone: [phone]  Fax: [phone]    07/20/2009  Thank you in advance for agreeing to see my patient:  Imaan Padgett [address] [place], [place]  [address]  Phone: [phone]  Reason for Referral: Chronic problems for past 2 years with ear plugging and popping.  Have tried allergy medication--not sure how consistently she was using meds.  Have switched them today to Xyzal and continues on Nasocort.  Has been on Singulair as well.  Does not feel any of the meds have helped with ear symptoms.  Procedures Requested: Evaluation for chronic eustachian tube dysfunction.  Current Medical Problems: 1)  KNEE PAIN, LEFT (ICD-719.46) 2)  EUSTACHIAN TUBE DYSFUNCTION, BILATERAL (ICD-381.81) 3)  LYMPHADENOPATHY (ICD-785.6) 4)  TRICHOMONAL VAGINITIS (ICD-131.01) 5)  INSOMNIA (ICD-780.52) 6)  ANEMIA, MILD (ICD-285.9) 7)  POSSIBLE EXERCISE INDUCED ASTHMA (ICD-493.81) 8)  HEALTH MAINTENANCE EXAM (ICD-V70.0) 9)  ROUTINE GYNECOLOGICAL EXAMINATION (ICD-V72.31) 10)  DIZZINESS (ICD-780.4) 11)  GERD (ICD-530.81) 12)  BIPOLAR DISORDER UNSPECIFIED (ICD-296.80) 13)  HYPERCHOLESTEROLEMIA, MILD (ICD-272.0) 14)  DISORDER, LIVER NOS (ICD-573.9) 15)  ALLERGIC RHINITIS (ICD-477.9) 16)  IRRITABLE BOWEL SYNDROME (ICD-564.1) 17)  ANXIETY STATE NOS (ICD-300.00) 18)  DISORDER, DEPRESSIVE NEC (ICD-311)   Current Medications: 1)  NASACORT AQ 55 MCG/ACT  AERS (TRIAMCINOLONE ACETONIDE(NASAL)) 2 spray each nostril daily 2)  ACIPHEX 20 MG TBEC (RABEPRAZOLE SODIUM) 1 tab by mouth 1/2 hour before morning meal on empty stomach 3)  ANTIVERT 25 MG TABS (MECLIZINE HCL) take one half pill every 6 hours as needed for dizziness 4)  CYMBALTA 60 MG CPEP (DULOXETINE HCL) 1 cap with 30 mg cap daily 5)  CYMBALTA 30 MG CPEP (DULOXETINE HCL) 1 cap daily with 60 mg cap [facility] 6)  CLONAZEPAM 0.5 MG  TABS (CLONAZEPAM) 1 tab by mouth two times a day as needed anxiety--[facility] 7)  PROVENTIL HFA 108 (90 BASE) MCG/ACT AERS (ALBUTEROL SULFATE) 2 puffs every 4 hours as needed for dyspnea 8)  FERROUS SULFATE 325 (65 FE) MG TABS (FERROUS SULFATE) 1 tab by mouth daily 9)  XYZAL 5 MG TABS (LEVOCETIRIZINE DIHYDROCHLORIDE) 1 tab by mouth daily 10)  DICLOFENAC POTASSIUM 50 MG TABS (DICLOFENAC POTASSIUM) 1 tab by mouth two times a day as needed knee pain   Past Medical History: 1)  HYPERCHOLESTEROLEMIA, MILD (ICD-272.0) 2)  DISORDER, LIVER NOS (ICD-573.9) 3)  ALLERGIC RHINITIS (ICD-477.9) 4)  IRRITABLE BOWEL SYNDROME (ICD-564.1) 5)  ANXIETY STATE NOS (ICD-300.00) 6)  DISORDER, DEPRESSIVE NEC (ICD-311)   Prior History of Blood Transfusions:   Pertinent Labs:    Thank you again for agreeing to see our patient; please contact us if you have any further questions or need additional information.  Sincerely,  Julieanne Manson MD

## 2010-04-23 NOTE — Progress Notes (Signed)
Summary: gyn referral  Phone Note Outgoing Call   Summary of Call: Nora--referral to Gynecology Initial call taken by: Julieanne Manson MD,  January 11, 2010 12:09 PM  Follow-up for Phone Call        I SEND A REFERRAL TO [facility] WAITING FOR AN APPT .Marland KitchenCheryll Dessert  January 11, 2010 12:57 PM *PT HAVE AN APPT 02-13-10 @ 2:15PM  LVM TO PT TO CALL ME BACK  Follow-up by: Cheryll Dessert,  January 14, 2010 8:37 AM

## 2010-04-23 NOTE — Assessment & Plan Note (Signed)
Summary: Depression  Medications Added EFFEXOR XR 75 MG  CP24 (VENLAFAXINE HCL) 3 tablets by mouth daily for mood        Vital Signs:  Patient Profile:   50 Years Old Female Height:     69 inches Weight:      200 pounds BMI:     29.64 BSA:     2.07 Temp:     98.0 degrees F oral Pulse rate:   84 / minute Pulse rhythm:   regular Resp:     22 per minute BP sitting:   138 / 82  (right arm)  Pt. in pain?   no                  Chief Complaint:  discuss depression medication and pt has been doubling doses / disability.  History of Present Illness:  Pt into the office for f/u.  She is usually a pt of Dr. Delrae Alfred. Pt notes that a couple of weeks ago she completed her supply of meds.  She is taking Effexor 150mg .  Pt admits that she has increased the effexor 300mg  daily.  She felt like she need to increase her dose because she felt like stressors of life were getting too hard.  Pt started a new job on last month but is is more stressfull.  Pt admits she has crying spells.  she is withdrawing from things that she likes.  She isolates herself.     She did have a Rx for Xanax but she did not get if filled. (chart notes that pt was only to get this filled once) she has a 86 year old daughter and her mood is affecting her child. Pt states that [facility] is [facility].  She has an appointment with [facility] [place]. She notes that their office needs authorization before she can be seen.  Pt has not been in any couseling in over 1 1/2 years.  She notes that her daughter is in grief couseling mourning the loss of her grandmother (the pts mother).   Current Allergies: No known allergies      Review of Systems  General      Denies chills, fatigue, and fever.  Eyes      Denies blurring, discharge, double vision, eye irritation, eye pain, halos, itching, light sensitivity, red eye, vision loss-1 eye, and vision loss-both eyes.  ENT      Denies decreased  hearing, difficulty swallowing, ear discharge, earache, hoarseness, nasal congestion, nosebleeds, postnasal drainage, ringing in ears, sinus pressure, and sore throat.  CV      Denies chest pain or discomfort, fatigue, and shortness of breath with exertion.  Resp      Denies cough and shortness of breath.  GI      Denies abdominal pain, nausea, and vomiting.  GU      Denies abnormal vaginal bleeding, decreased libido, discharge, dysuria, genital sores, hematuria, incontinence, nocturia, urinary frequency, and urinary hesitancy.  MS      Denies joint pain, joint redness, joint swelling, loss of strength, low back pain, mid back pain, muscle aches, muscle , cramps, muscle weakness, stiffness, and thoracic pain.  Neuro      depression  Endo      Denies cold intolerance, excessive hunger, excessive thirst, excessive urination, heat intolerance, polyuria, and weight change.   Physical Exam  General:     alert and well-developed.   Head:     normocephalic.  Eyes:     pupils round.   Ears:     R ear normal and L ear normal.   Mouth:     good dentition.   Neck:     supple.   Lungs:     normal respiratory effort, no intercostal retractions, no accessory muscle use, and normal breath sounds.   Heart:     normal rate, regular rhythm, no murmur, no gallop, and no rub.   Abdomen:     soft, non-tender, normal bowel sounds, and no distention.   Msk:     normal ROM.   Neurologic:     alert & oriented X3.   Skin:     color normal.   Psych:     Oriented X3.      Impression & Recommendations:  Problem # 1:  DISORDER, DEPRESSIVE NEC (ICD-311) Advised pt not to adjust meds without discussing with provider first.  She should take maximum dose of Effexor XL 225mg  daily.  Advised if this is still not effective her provider may consider adding another medication. Pt has made contact with [facility] [place]. this provider will call their office to give authorization as she  states it is needed before she can be seen. Her updated medication list for this problem includes:    Effexor Xr 75 Mg Cp24 (Venlafaxine hcl) .Marland KitchenMarland KitchenMarland KitchenMarland Kitchen 3 tablets by mouth daily for mood   Complete Medication List: 1)  Effexor Xr 75 Mg Cp24 (Venlafaxine hcl) .... 3 tablets by mouth daily for mood 2)  Hyoscyamine Sulfate 0.375 Mg Tb12 (Hyoscyamine sulfate) .Marland Kitchen.. 1 tab by mouth two times a day prn 3)  Allegra-d 12 Hour 60-120 Mg Tb12 (Fexofenadine-pseudoephedrine) .Marland Kitchen.. 1 tab by mouth two times a day as needed allergies 4)  Nasacort Aq 55 Mcg/act Aers (Triamcinolone acetonide(nasal)) .... 2 spray each nostril daily 5)  Singulair 10 Mg Tabs (Montelukast sodium) .Marland Kitchen.. 1 tab by mouth daily 6)  Protonix 40 Mg Tbec (Pantoprazole sodium) .Marland Kitchen.. 1 tab by mouth daily   Patient Instructions: 1)  Follow up in 4-6 weeks with Dr. Delrae Alfred.    Prescriptions: EFFEXOR XR 75 MG  CP24 (VENLAFAXINE HCL) 3 tablets by mouth daily for mood  #90 x 3   Entered and Authorized by:   Lehman Prom FNP   Signed by:   Lehman Prom FNP on 02/02/2007   Method used:   Print then Give to Patient   RxID:   [id number]  ]

## 2010-04-23 NOTE — Letter (Signed)
Summary: *HSN Results Follow up  [facility]  [address] [place], [place] [address]   Phone: [phone]  Fax: [phone]      08/06/2009   Gardiner Ramus A Appleyard [address] [place], [place]  [address]   Dear  Ms. Mayeli Benning,                            ____S.Drinkard,FNP   ____D. Gore,FNP       ____B. McPherson,MD   ____V. Rankins,MD    __X__E. Delpha Perko,MD    ____N. Daphine Deutscher, FNP  ____D. Reche Dixon, MD    ____K. Philipp Deputy, MD    ____Other     This letter is to inform you that your recent test(s):  _______Pap Smear    _______Lab Test     ___X____X-ray    ___X____ is within acceptable limits  _______ requires a medication change  _______ requires a follow-up lab visit  _______ requires a follow-up visit with your provider   Comments:  Xray of knee was normal.  Let me know if continues to cause problems       _________________________________________________________ If you have any questions, please contact our office                     Sincerely,  Julieanne Manson MD [facility]

## 2010-04-23 NOTE — Letter (Signed)
Summary: faxed requested labs  faxed requested labs   Imported By: Arta Bruce 10/19/2009 14:52:23  _____________________________________________________________________  External Attachment:    Type:   Image     Comment:   External Document

## 2010-04-23 NOTE — Assessment & Plan Note (Signed)
Summary: DIZZINESS/PAIN IN CHEST///KT   Vital Signs:  Patient profile:   50 year old female Height:      69 inches Weight:      202 pounds BMI:     29.94 BSA:     2.08 Temp:     98.6 degrees F Pulse rate:   66 / minute Pulse rhythm:   regular Resp:     20 per minute BP sitting:   110 / 80  (right arm) Cuff size:   regular CC: pt having dizzey spells.....[name] pain her chest and around under her right arm... having abdomen pain... x 2weeks...[name] pt thinks she has a uti... pt has taken garlic pill.  pt been having discharge and burning.... Would like to be tested for DM Is Patient Diabetic? No Pain Assessment Patient in pain? yes     Location: chest Intensity: 6 Type: sharp  Does patient need assistance? Ambulation Normal Comments pt needs a refill on her singular, and her Nasocort pt was taken off of her Effexor by [facility]   CC:  pt having dizzey spells.....[name] pain her chest and around under her right arm... having abdomen pain... x 2weeks...[name] pt thinks she has a uti... pt has taken garlic pill.  pt been having discharge and burning.... Would like to be tested for DM.  History of Present Illness: Pt c/o 2 week dizziness, which she says is not new, but has hasn't had a recent episode.  Worst was in 1999 when she had "vertigo" and had to have "special meds" which she knows this isn't is not bad.  This time developed when she was in [employer] working on a paper because she started school, (on [employer]) studying psychology.   She tried to go get her hair done for her daughter's graduation, but was too dizzy to go for this appointment.  No nausea or vomiting with these episodes.  Says the episodes last from minutes to hours.  She has not changed any of her standard routines. She drove herself here today. No change in appetite, eating "more if anything".  Pt thinks it could a side effect of Cymbalta although she has been on Cymbalta for over a year.  There is no pattern to  symptoms related to movement.  Ongoing pain in upper anterior chest which radiates to back of shoulder.  This stays constant.  H/O IBS not on Protonix for awhile because of losing Medicaid.  Burps alot.    Breakfast:  Vilma Meckel breakfast croissant with sausaige/egg/cheese; juice(minute maid) she adds water because it is too sweet.  Lunch: Fast Food   Habits & Providers  Alcohol-Tobacco-Diet     Alcohol drinks/day: 0     Tobacco Status: never  Current Medications (verified): 1)  Effexor Xr 75 Mg  Cp24 (Venlafaxine Hcl) .... 3 Tablets By Mouth Daily For Mood 2)  Hyoscyamine Sulfate 0.375 Mg  Tb12 (Hyoscyamine Sulfate) .[name].. 1 Tab By Mouth Two Times A Day Prn 3)  Allegra-D 12 Hour 60-120 Mg  Tb12 (Fexofenadine-Pseudoephedrine) .[name].. 1 Tab By Mouth Two Times A Day As Needed Allergies 4)  Nasacort Aq 55 Mcg/act  Aers (Triamcinolone Acetonide(Nasal)) .... 2 Spray Each Nostril Daily 5)  Singulair 10 Mg  Tabs (Montelukast Sodium) .[name].. 1 Tab By Mouth Daily 6)  Protonix 40 Mg  Tbec (Pantoprazole Sodium) .[name].. 1 Tab By Mouth Daily  Allergies (verified): No Known Drug Allergies  Social History: Smoking Status:  never  Review of Systems  The patient complains of weight gain and chest pain.  The patient denies fever, weight loss, dyspnea on exertion, peripheral edema, melena, muscle weakness, and difficulty walking.    Physical Exam  General:  alert, well-developed, well-nourished, and well-hydrated.  alert, well-developed, well-nourished, and well-hydrated.   Lungs:  normal respiratory effort, no accessory muscle use, and normal breath sounds.   Heart:  normal rate and regular rhythm.   Abdomen:  soft, non-tender, normal bowel sounds, no distention, no masses, no guarding, no rigidity, and no rebound tenderness.   Extremities:  No clubbing, cyanosis, edema, or deformity noted with normal full range of motion of all joints.   Neurologic:  alert & oriented X3, sensation intact to pinprick,  gait normal, and DTRs symmetrical and normal.   Skin:  turgor normal and color normal.   Psych:  Oriented X3 and flat affect., good eye contact     Impression & Recommendations:  Problem # 1:  DIZZINESS (ICD-780.4) Assessment Unchanged  Her updated medication list for this problem includes:    Antivert 25 Mg Tabs (Meclizine hcl) .[name]... Take one half pill every 6 hours as needed for dizziness  Problem # 2:  GERD (ICD-530.81)  Her updated medication list for this problem includes:    Hyoscyamine Sulfate 0.375 Mg Tb12 (Hyoscyamine sulfate) .[name]... 1 tab by mouth two times a day prn    Protonix 40 Mg Tbec (Pantoprazole sodium) .[name]... 1 tab by mouth daily  Orders: UA Dipstick W/ Micro (manual) ([address])  Complete Medication List: 1)  Effexor Xr 75 Mg Cp24 (Venlafaxine hcl) .... 3 tablets by mouth daily for mood 2)  Hyoscyamine Sulfate 0.375 Mg Tb12 (Hyoscyamine sulfate) .[name].. 1 tab by mouth two times a day prn 3)  Allegra-d 12 Hour 60-120 Mg Tb12 (Fexofenadine-pseudoephedrine) .[name].. 1 tab by mouth two times a day as needed allergies 4)  Nasacort Aq 55 Mcg/act Aers (Triamcinolone acetonide(nasal)) .... 2 spray each nostril daily 5)  Singulair 10 Mg Tabs (Montelukast sodium) .[name].. 1 tab by mouth daily 6)  Protonix 40 Mg Tbec (Pantoprazole sodium) .[name].. 1 tab by mouth daily 7)  Antivert 25 Mg Tabs (Meclizine hcl) .... Take one half pill every 6 hours as needed for dizziness  Patient Instructions: 1)  Schedule a Cpp and fasting labs 2)  Restart Omeprazole 20 mg two times a day or Protonix 40 mg daily.Reviewed diet changes, exercise and wt loss. Also restart Antivert 25mg , one half pill every 6 hours for dizziness. Caution on sedation with other meds. Prescriptions: ANTIVERT 25 MG TABS (MECLIZINE HCL) take one half pill every 6 hours as needed for dizziness  #30 x 0   Entered and Authorized by:   Fannie Knee Mikki Ziff   Signed by:   Fannie Knee Monike Bragdon on 08/30/2008   Method used:   Print then Give to Patient    RxID:   [id number] PROTONIX 40 MG  TBEC (PANTOPRAZOLE SODIUM) 1 tab by mouth daily  #0 x 0   Entered and Authorized by:   Fannie Knee Latorsha Curling   Signed by:   Fannie Knee Giavanni Zeitlin on 08/30/2008   Method used:   Print then Give to Patient   RxID:   [id number]

## 2010-04-23 NOTE — Assessment & Plan Note (Signed)
Summary: KNOT BEHIND RIGHT EAR//GK   Vital Signs:  Patient profile:   50 year old female Menstrual status:  regular Weight:      201.4 pounds BMI:     29.85 Temp:     97.9 degrees F Pulse rate:   109 / minute Pulse rhythm:   regular Resp:     18 per minute BP sitting:   115 / 78  (left arm) Cuff size:   large  Vitals Entered By: Vesta Mixer CMA (July 20, 2009 10:22 AM) CC: Knot behind right ear and c/o of fluid on her left knee Pain Assessment Patient in pain? yes     Location: ear Intensity: 3  Does patient need assistance? Ambulation Normal   CC:  Knot behind right ear and c/o of fluid on her left knee.  History of Present Illness: 1.  Knot behind right ear:  noted 1 1/2 months ago.  May be a bit tender when palpate it.  Had another knot at one point in left nuchal area.  2.  Ear plugging:  Has been on allergy meds--not clear how consistent she was with taking them, but feels she did take them regularly and did not seem to make a difference. Previously on Nasocort, Allegra D and Singulair.  Currently only using Nasocort.  States her right ear in particular aches when she has a phone pressed against it.    3.  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  Still with a knot on the knee.  Pain not so bad now and swelling gone as well.  Did try heat on knee at night.    Allergies (verified): No Known Drug Allergies  Physical Exam  Head:  No scalp lesions. No periauricular adenopathy or masses Eyes:  No corneal or conjunctival inflammation noted. EOMI. Perrla. Funduscopic exam benign, without hemorrhages, exudates or papilledema. Vision grossly normal. Ears:  TMs dull bilaterally Nose:  MIld nasal mucosal swelling. Mouth:  Posterior pharynx difficult to see as pt. cannot get base of tongue down.  No oral lesions Lungs:  Normal respiratory effort, chest expands  symmetrically. Lungs are clear to auscultation, no crackles or wheezes. Heart:  Normal rate and regular rhythm. S1 and S2 normal without gallop, murmur, click, rub or other extra sounds. Extremities:  Full ROM of bilateral knees.  No effusion or erythema noted on left.  Hypertrophic changes bilaterally.  Possibly mild discomfort with compression of patella against anterior joint.  No crepitation.  No joint line tenderness.  No tenderness or laxity of cruciate or collateral ligaments. Cervical Nodes:  No lymphadenopathy noted--anterior or posterior Axillary Nodes:  No palpable lymphadenopathy   Impression & Recommendations:  Problem # 1:  LYMPHADENOPATHY (ICD-785.6)  Suspect had an enlarged reactive lymph node previously--no findings currently  Problem # 2:  EUSTACHIAN TUBE DYSFUNCTION, BILATERAL (ICD-381.81)  Send to ENT--suspect having allergies that are just not well controlled, but pt. interested in ENT evaluation. Will try another round of Nasocort and switch to Xyzal.  Orders: ENT Referral (ENT)  Problem # 3:  KNEE PAIN, LEFT (ICD-719.46) Left knee films with sunrise view Her updated medication list for this problem includes:    Diclofenac Potassium 50 Mg Tabs (Diclofenac potassium) .Marland Kitchen... 1 tab by mouth two times a day as needed knee pain  Complete Medication List: 1)  Nasacort Aq 55 Mcg/act Aers (Triamcinolone acetonide(nasal)) .... 2 spray each  nostril daily 2)  Aciphex 20 Mg Tbec (Rabeprazole sodium) .Marland Kitchen.. 1 tab by mouth 1/2 hour before morning meal on empty stomach 3)  Antivert 25 Mg Tabs (Meclizine hcl) .... Take one half pill every 6 hours as needed for dizziness 4)  Cymbalta 60 Mg Cpep (Duloxetine hcl) .Marland Kitchen.. 1 cap with 30 mg cap daily 5)  Cymbalta 30 Mg Cpep (Duloxetine hcl) .Marland Kitchen.. 1 cap daily with 60 mg cap [facility] 6)  Clonazepam 0.5 Mg Tabs (Clonazepam) .Marland Kitchen.. 1 tab by mouth two times a day as needed anxiety--[facility] 7)  Proventil Hfa 108 (90 Base) Mcg/act  Aers (Albuterol sulfate) .... 2 puffs every 4 hours as needed for dyspnea 8)  Ferrous Sulfate 325 (65 Fe) Mg Tabs (Ferrous sulfate) .Marland Kitchen.. 1 tab by mouth daily 9)  Xyzal 5 Mg Tabs (Levocetirizine dihydrochloride) .Marland Kitchen.. 1 tab by mouth daily 10)  Diclofenac Potassium 50 Mg Tabs (Diclofenac potassium) .Marland Kitchen.. 1 tab by mouth two times a day as needed knee pain  Other Orders: Diagnostic X-Ray/Fluoroscopy (Diagnostic X-Ray/Flu)  Patient Instructions: 1)  Take Diclofenac with food--do not use other antiinflammatory meds with it. Prescriptions: DICLOFENAC POTASSIUM 50 MG TABS (DICLOFENAC POTASSIUM) 1 tab by mouth two times a day as needed knee pain  #60 x 4   Entered and Authorized by:   Julieanne Manson MD   Signed by:   Julieanne Manson MD on 07/20/2009   Method used:   Electronically to        [facility] [phone]* (retail)       [address]       [place], [place]  [address]       Ph: [phone]       Fax: [phone]   RxID:   [id number] XYZAL 5 MG TABS (LEVOCETIRIZINE DIHYDROCHLORIDE) 1 tab by mouth daily  #30 x 11   Entered and Authorized by:   Julieanne Manson MD   Signed by:   Julieanne Manson MD on 07/20/2009   Method used:   Faxed to ...       [facility] (retail)       [address] [place], [place]  [address]       Ph: [phone]       Fax: [phone]   RxID:   [id number]

## 2010-04-23 NOTE — Progress Notes (Signed)
Summary: FOLLOWING UP ABOUT HER ENT REF.  Phone Note Call from Patient Call back at Home Phone [phone]   Reason for Call: Referral Summary of Call: Darielys Giglia PT. [place] CALLED AND SAYS THAT SHE HAS HER ORANGE CARD AGAIN, AND WANTS [name] TO GO AHEAD AND DO HER ENT REFERRAL. Initial call taken by: Leodis Rains,  December 20, 2009 12:36 PM  Follow-up for Phone Call        Nora--see orders back in May as well as letter--please restart ENT referral Follow-up by: Julieanne Manson MD,  January 04, 2010 9:11 AM  Additional Follow-up for Phone Call Additional follow up Details #1::        I SEND THE REFERRAL TO [facility] WAITING FOR AN APPT .Marland KitchenCheryll Dessert  January 04, 2010 4:55 PM

## 2010-04-23 NOTE — Progress Notes (Signed)
Summary: ENT referral - letter re phone numbers  Phone Note From Other Clinic   Caller: Referral Coordinator Summary of Call: Pt's number is not working.If Pt should contact the office, Please have her to contact me regarding her ENT referral Initial call taken by: Candi Leash,  Aug 06, 2009 8:45 AM  Follow-up for Phone Call        Please send a letter to pt. --need a phone number to contact her Follow-up by: Julieanne Manson MD,  Aug 08, 2009 8:36 AM  Additional Follow-up for Phone Call Additional follow up Details #1::        Letter sent to patient.   Additional Follow-up by: Dutch Quint RN,  Aug 08, 2009 10:04 AM

## 2010-04-23 NOTE — Miscellaneous (Signed)
Summary: VIP  Patient: [name] Note: All result statuses are Final unless otherwise noted.  Tests: (1) VIP (Medications)   LLIMPORTMEDS              "Result Below..."       RESULT: SINGULAIR TABS 10 MG*TAKE ONE (1) TABLET EACH DAY*04/23/2006*Last Refill: 11/13/2006*[id number]*******   LLIMPORTMEDS              "Result Below..."       RESULT: RHINOCORT AQUA SUSP 32 MCG/ACT*SPRAY 2 PUFFS IN EACH NOSTRIL DAILY*05/29/2006*Last Refill: Mick.[email or web address]*******   LLIMPORTMEDS              "Result Below..."       RESULT: EFFEXOR XR CP24 150 MG*TAKE ONE (1) CAPSULE EACH DAY*08/28/2006*Last Refill: 12/14/2006*[id number]*******   LLIMPORTMEDS              "Result Below..."       RESULT: DICYCLOMINE HCL CAPS 10 MG*TAKE 1 CAPSULE BY MOUTH 2 TIMES DAILY AS NEEDED FOR ABDOMINAL PAIN*12/15/2006*Last Refill: ZOXWRUE*[id number]*******   LLIMPORTMEDS              "Result Below..."       RESULT: COMPOUND LIQUID*TAKE 1 TO 2 TEASPOONSFULS BY MOUTH EVERY 4 TO 6 HOURS AS NEEDED  MINTOX / HYDRAMINE 1:1 MIX*05/04/2006*Last Refill: Unknown********   LLIMPORTALLS              NKDA***  Note: An exclamation mark (!) indicates a result that was not dispersed into the flowsheet. Document Creation Date: 01/21/2007 2:58 PM _______________________________________________________________________  (1) Order result status: Final Collection or observation date-time: 12/09/2006 Requested date-time: 12/09/2006 Receipt date-time:  Reported date-time: 12/09/2006 Referring Physician:   Ordering Physician:   Specimen Source:  Source: [place] Order Number:  Lab site:

## 2010-04-23 NOTE — Assessment & Plan Note (Signed)
Summary: VAGINAL ITCHING////KT   Vital Signs:  Patient profile:   50 year old female Menstrual status:  regular LMP:     12/22/2009 Weight:      213.8 pounds Temp:     97.9 degrees F Pulse rate:   110 / minute Resp:     20 per minute BP sitting:   106 / 74  (left arm) Cuff size:   regular  Vitals Entered By: Michelle Nasuti (January 11, 2010 10:46 AM) CC: C/O VAGINAL ITCHING AND BURNING. BURNING WITH URINATION.DENIES VAG D/C OR ODOR X 2 WEEKS.WOULD LIKE TO REVIEW XRAY RESULTS AS PT HAS JOINT PAIN Pain Assessment Patient in pain? yes     Location: ALL OVER/ JOINTS Intensity: 5  Does patient need assistance? Functional Status Self care Ambulation Normal LMP (date): 12/22/2009 LMP - Character: normal     Enter LMP: 12/22/2009 Last PAP Result NEGATIVE FOR INTRAEPITHELIAL LESIONS OR MALIGNANCY.   CC:  C/O VAGINAL ITCHING AND BURNING. BURNING WITH URINATION.DENIES VAG D/C OR ODOR X 2 WEEKS.WOULD LIKE TO REVIEW XRAY RESULTS AS PT HAS JOINT PAIN.  History of Present Illness: 1.  Sinusitis:  better.  Still with sense of cough coming from chest.  Phlegm is clear.    2.  Vaginal itching and burning when  urinates.  Has not noted any discharge.  Just finished amoxicillin followed by Zithromax.  Had unprotected sex on 10/10.  Symptoms started after.  Intercourse with someone she knows fairly well.  She has not asked if he has any symptoms.  3.  Left knee discomfort:  wearing a neoprene sleeve and helps.  Discussed that xray was normal back in May.    Allergies: No Known Drug Allergies  Physical Exam  General:  NAD Eyes:  face and periorbital area no longer puffy Lungs:  Normal respiratory effort, chest expands symmetrically. Lungs are clear to auscultation, no crackles or wheezes. Heart:  Normal rate and regular rhythm. S1 and S2 normal without gallop, murmur, click, rub or other extra sounds. Genitalia:  Left labial cyst-2 cm in length. Thin grey white discharge, mild odor.   No underlying mucosal lesions or inflammation   Impression & Recommendations:  Problem # 1:  SINUSITIS, ACUTE (ICD-461.9) IMproved The following medications were removed from the medication list:    Azithromycin 250 Mg Tabs (Azithromycin) .Marland Kitchen... 2 tabs by mouth today, then 1 tab by mouth daily for 4 more days Her updated medication list for this problem includes:    Nasacort Aq 55 Mcg/act Aers (Triamcinolone acetonide(nasal)) .Marland Kitchen... 2 spray each nostril daily    Metronidazole 500 Mg Tabs (Metronidazole) .Marland Kitchen... 1 tab by mouth two times a day  Problem # 2:  KNEE PAIN, LEFT (ICD-719.46) Discussed quadricep strengthening exercises Her updated medication list for this problem includes:    Diclofenac Potassium 50 Mg Tabs (Diclofenac potassium) .Marland Kitchen... 1 tab by mouth two times a day as needed knee pain  Problem # 3:  VAGINAL PRURITUS (ICD-698.1) Appears to be BV Orders: KOH/ WET Mount [phone]) T- GC Chlamydia (52841)  Problem # 4:  LEFT LABIAL CYST (ICD-V13.3) Meant to do this earlier in year--she would like removed Orders: Gynecologic Referral (Gyn)  Complete Medication List: 1)  Nasacort Aq 55 Mcg/act Aers (Triamcinolone acetonide(nasal)) .... 2 spray each nostril daily 2)  Aciphex 20 Mg Tbec (Rabeprazole sodium) .Marland Kitchen.. 1 tab by mouth 1/2 hour before morning meal on empty stomach 3)  Antivert 25 Mg Tabs (Meclizine hcl) .... Take one half pill every 6  hours as needed for dizziness 4)  Cymbalta 60 Mg Cpep (Duloxetine hcl) .Marland Kitchen.. 1 cap with 30 mg cap daily 5)  Cymbalta 30 Mg Cpep (Duloxetine hcl) .Marland Kitchen.. 1 cap daily with 60 mg cap [facility] 6)  Clonazepam 0.5 Mg Tabs (Clonazepam) .Marland Kitchen.. 1 tab by mouth two times a day as needed anxiety--[facility] 7)  Proventil Hfa 108 (90 Base) Mcg/act Aers (Albuterol sulfate) .... 2 puffs every 4 hours as needed for dyspnea 8)  Ferrous Sulfate 325 (65 Fe) Mg Tabs (Ferrous sulfate) .Marland Kitchen.. 1 tab by mouth daily 9)  Xyzal 5 Mg Tabs (Levocetirizine  dihydrochloride) .Marland Kitchen.. 1 tab by mouth daily 10)  Diclofenac Potassium 50 Mg Tabs (Diclofenac potassium) .Marland Kitchen.. 1 tab by mouth two times a day as needed knee pain 11)  Metronidazole 500 Mg Tabs (Metronidazole) .Marland Kitchen.. 1 tab by mouth two times a day  Patient Instructions: 1)  Call if symptoms do not improve Prescriptions: METRONIDAZOLE 500 MG TABS (METRONIDAZOLE) 1 tab by mouth two times a day  #14 x 0   Entered and Authorized by:   Julieanne Manson MD   Signed by:   Julieanne Manson MD on 01/11/2010   Method used:   Faxed to ...       [facility] (retail)       [address] [place], [place]  [address]       Ph: [phone]       Fax: [phone]   RxID:   [id number]    Orders Added: 1)  Gynecologic Referral [Gyn] 2)  Est. Patient Level III [99213] 3)  KOH/ WET Mount [87210] 4)  T- GC Chlamydia [84132]    Laboratory Results   Urine Tests  Date/Time Received: January 11, 2010 Date/Time Reported: 11:39 AM  Routine Urinalysis   Color: lt. yellow Appearance: Clear Glucose: negative   (Normal Range: Negative) Bilirubin: negative   (Normal Range: Negative) Ketone: trace (5)   (Normal Range: Negative) Spec. Gravity: >=1.030   (Normal Range: 1.003-1.035) Blood: negative   (Normal Range: Negative) pH: 5.5   (Normal Range: 5.0-8.0) Protein: trace   (Normal Range: Negative) Urobilinogen: 0.2   (Normal Range: 0-1) Nitrite: negative   (Normal Range: Negative) Leukocyte Esterace: negative   (Normal Range: Negative)      Wet Mount/KOH Source: vaginal WBC/hpf: 1-5 Bacteria/hpf: 2+ Clue cells/hpf: many  Negative whiff Yeast/hpf: none Trichomonas/hpf: none

## 2010-04-23 NOTE — Letter (Signed)
Summary: *HSN Results Follow up  [facility]  [address] [place], [place] [address]   Phone: [phone]  Fax: [phone]      01/26/2010   [name] [address] [place], [place]  [address]   Dear  [name],                            ____S.Drinkard,FNP   ____D. Gore,FNP       ____B. McPherson,MD   ____V. Rankins,MD    __X__E. Yerik Zeringue,MD    ____N. Daphine Deutscher, FNP  ____D. Reche Dixon, MD    ____K. Philipp Deputy, MD    ____Other     This letter is to inform you that your recent test(s):  _______Pap Smear    ____X___Lab Test     _______X-ray    ____X__ is within acceptable limits  _______ requires a medication change  _______ requires a follow-up lab visit  _______ requires a follow-up visit with your provider   Comments:  Testing for gonorrhea and chlamydia negative at last visit (you do not have these based on our labs)       _________________________________________________________ If you have any questions, please contact our office                     Sincerely,  Julieanne Manson MD [facility]

## 2010-04-23 NOTE — Letter (Signed)
Summary: MAILED REQUESTED RECORDS TO [facility]  MAILED REQUESTED RECORDS TO [facility]   Imported By: Arta Bruce 07/20/2009 12:05:35  _____________________________________________________________________  External Attachment:    Type:   Image     Comment:   External Document

## 2010-04-23 NOTE — Letter (Signed)
Summary: FAXED REQUESTED RECORDS TO THE [facility]  FAXED REQUESTED RECORDS TO THE [facility]   Imported By: Arta Bruce 11/01/2009 12:26:38  _____________________________________________________________________  External Attachment:    Type:   Image     Comment:   External Document

## 2010-04-23 NOTE — Progress Notes (Signed)
Summary: ENT referral   Phone Note Outgoing Call   Call placed by: Julieanne Manson MD,  July 20, 2009 11:29 AM Summary of Call: Debra--see order and letter for ENT Initial call taken by: Julieanne Manson MD,  July 20, 2009 11:29 AM

## 2010-04-23 NOTE — Letter (Signed)
Summary: Lipid Letter  [facility]  [address] [place], [place] [address]   Phone: [phone]  Fax: [phone]    11/16/2008  Glenice Ciccone [address] [place], [place]  [address]  Dear Gardiner Ramus:  We have carefully reviewed your last lipid profile from 10/26/2008 and the results are noted below with a summary of recommendations for lipid management.    Cholesterol:       200     Goal: <200   HDL "good" Cholesterol:   61     Goal: >45   LDL "bad" Cholesterol:   119     Goal: <100   Triglycerides:       98     Goal: <150    Cholesterol is generally okay--good cholesterol is especially at a good level--that will protect your heart.  You are a bit anemic--please make sure you get your stool cards in as soon as possible.  Suspect your anemia is from your heavy periods.  Recommend taking iron once daily to replace this and will call that into your pharmacy for you to pick up.      TLC Diet (Therapeutic Lifestyle Change): Saturated Fats & Transfatty acids should be kept < 7% of total calories ***Reduce Saturated Fats Polyunstaurated Fat can be up to 10% of total calories Monounsaturated Fat Fat can be up to 20% of total calories Total Fat should be no greater than 25-35% of total calories Carbohydrates should be 50-60% of total calories Protein should be approximately 15% of total calories Fiber should be at least 20-30 grams a day ***Increased fiber may help lower LDL Total Cholesterol should be < 200mg /day Consider adding plant stanol/sterols to diet (example: Benacol spread) ***A higher intake of unsaturated fat may reduce Triglycerides and Increase HDL    Adjunctive Measures (may lower LIPIDS and reduce risk of Heart Attack) include: Aerobic Exercise (20-30 minutes 3-4 times a week) Limit Alcohol Consumption Weight Reduction Aspirin 75-81 mg a day by mouth (if not allergic or contraindicated) Dietary Fiber 20-30 grams a day by mouth    Current Medications: 1)    Nasacort Aq 55 Mcg/act  Aers (Triamcinolone acetonide(nasal)) .... 2 spray each nostril daily 2)    Protonix 40 Mg  Tbec (Pantoprazole sodium) .Marland Kitchen.. 1 tab by mouth daily 3)    Antivert 25 Mg Tabs (Meclizine hcl) .... Take one half pill every 6 hours as needed for dizziness 4)    Cymbalta 60 Mg Cpep (Duloxetine hcl) .Marland Kitchen.. 1 cap with 30 mg cap daily 5)    Cymbalta 30 Mg Cpep (Duloxetine hcl) .Marland Kitchen.. 1 cap daily with 60 mg cap [facility] 6)    Clonazepam 0.5 Mg Tabs (Clonazepam) .Marland Kitchen.. 1 tab by mouth two times a day as needed anxiety--[facility] 7)    Proventil Hfa 108 (90 Base) Mcg/act Aers (Albuterol sulfate) .... 2 puffs every 4 hours as needed for dyspnea  If you have any questions, please call. We appreciate being able to work with you.   Sincerely,    [facility] Julieanne Manson MD

## 2010-04-23 NOTE — Assessment & Plan Note (Signed)
Summary: BEEN SICK,EARS PROBLEMS//MC   Vital Signs:  Patient profile:   50 year old female Menstrual status:  regular Height:      69 inches Weight:      215 pounds BMI:     31.86 Temp:     98.2 degrees F oral Pulse rate:   88 / minute Pulse rhythm:   regular Resp:     18 per minute BP sitting:   118 / 72  (left arm) Cuff size:   large  Vitals Entered By: [place] Shannon (January 04, 2010 2:22 PM) CC: pt is here because she has been sick.... pt started taking amoxcillin.... pt says she has congestion, and head sinuses.... pt says today she doesn't have an appetite.. Is Patient Diabetic? No Pain Assessment Patient in pain? no       Does patient need assistance? Functional Status Self care Ambulation Normal   CC:  pt is here because she has been sick.... pt started taking amoxcillin.... pt says she has congestion and and head sinuses.... pt says today she doesn't have an appetite...  History of Present Illness: 1.  2 weeks ago, developed sore throat and head congestion.  Used Thera flu and Catering manager.  Continued to cough up thick yellow mucous, lot of drainage down throat.  Found an old bottle of Amoxicillin belonging to her daughter last Friday.  Taking 875 mg two times a day.  Still does not feel great.   Still having sense of chest congestion, still with some yellow mucous production.  Basically after 7 days of treatment, mildly better.  Ears continue to ache, neck aches.  No fever.  Current Medications (verified): 1)  Nasacort Aq 55 Mcg/act  Aers (Triamcinolone Acetonide(Nasal)) .... 2 Spray Each Nostril Daily 2)  Aciphex 20 Mg Tbec (Rabeprazole Sodium) .Marland Kitchen.. 1 Tab By Mouth 1/2 Hour Before Morning Meal On Empty Stomach 3)  Antivert 25 Mg Tabs (Meclizine Hcl) .... Take One Half Pill Every 6 Hours As Needed For Dizziness 4)  Cymbalta 60 Mg Cpep (Duloxetine Hcl) .Marland Kitchen.. 1 Cap With 30 Mg Cap Daily 5)  Cymbalta 30 Mg Cpep (Duloxetine Hcl) .Marland Kitchen.. 1 Cap Daily With 60 Mg Cap [facility] 6)  Clonazepam 0.5 Mg Tabs (Clonazepam) .Marland Kitchen.. 1 Tab By Mouth Two Times A Day As Needed Anxiety--[facility] 7)  Proventil Hfa 108 (90 Base) Mcg/act Aers (Albuterol Sulfate) .... 2 Puffs Every 4 Hours As Needed For Dyspnea 8)  Ferrous Sulfate 325 (65 Fe) Mg Tabs (Ferrous Sulfate) .Marland Kitchen.. 1 Tab By Mouth Daily 9)  Xyzal 5 Mg Tabs (Levocetirizine Dihydrochloride) .Marland Kitchen.. 1 Tab By Mouth Daily 10)  Diclofenac Potassium 50 Mg Tabs (Diclofenac Potassium) .Marland Kitchen.. 1 Tab By Mouth Two Times A Day As Needed Knee Pain  Allergies (verified): No Known Drug Allergies  Physical Exam  General:  Face appears puffy Head:  Tender over maxillary sinuses Eyes:  No corneal or conjunctival inflammation noted. EOMI. Perrla. Funduscopic exam benign, without hemorrhages, exudates or papilledema. Vision grossly normal. Ears:  L TM with fluid behind,  R TM appears pearly gray Nose:  mucosal swelling and clear discharge Mouth:  pharynx pink and moist.   Neck:  No deformities, masses, or tenderness noted. Lungs:  Normal respiratory effort, chest expands symmetrically. Lungs are clear to auscultation, no crackles or wheezes. Heart:  Normal rate and regular rhythm. S1 and S2 normal without gallop, murmur, click, rub or other extra sounds.   Impression & Recommendations:  Problem # 1:  SINUSITIS, ACUTE (  ICD-461.9) To stop Amoxicillin--bottle thrown out. Her updated medication list for this problem includes:    Nasacort Aq 55 Mcg/act Aers (Triamcinolone acetonide(nasal)) .Marland Kitchen... 2 spray each nostril daily    Azithromycin 250 Mg Tabs (Azithromycin) .Marland Kitchen... 2 tabs by mouth today, then 1 tab by mouth daily for 4 more days  Problem # 2:  EUSTACHIAN TUBE DYSFUNCTION, BILATERAL (ICD-381.81) Send to ENT as continues to be a problem  Problem # 3:  ENCOUNTER FOR LONG-TERM USE OF OTHER MEDICATIONS (ICD-V58.69) Needs labs faxed to [phone]--[facility] Orders: T-Basic Metabolic Panel [id number]) T-Lipid Profile  [phone])  Complete Medication List: 1)  Nasacort Aq 55 Mcg/act Aers (Triamcinolone acetonide(nasal)) .... 2 spray each nostril daily 2)  Aciphex 20 Mg Tbec (Rabeprazole sodium) .Marland Kitchen.. 1 tab by mouth 1/2 hour before morning meal on empty stomach 3)  Antivert 25 Mg Tabs (Meclizine hcl) .... Take one half pill every 6 hours as needed for dizziness 4)  Cymbalta 60 Mg Cpep (Duloxetine hcl) .Marland Kitchen.. 1 cap with 30 mg cap daily 5)  Cymbalta 30 Mg Cpep (Duloxetine hcl) .Marland Kitchen.. 1 cap daily with 60 mg cap [facility] 6)  Clonazepam 0.5 Mg Tabs (Clonazepam) .Marland Kitchen.. 1 tab by mouth two times a day as needed anxiety--[facility] 7)  Proventil Hfa 108 (90 Base) Mcg/act Aers (Albuterol sulfate) .... 2 puffs every 4 hours as needed for dyspnea 8)  Ferrous Sulfate 325 (65 Fe) Mg Tabs (Ferrous sulfate) .Marland Kitchen.. 1 tab by mouth daily 9)  Xyzal 5 Mg Tabs (Levocetirizine dihydrochloride) .Marland Kitchen.. 1 tab by mouth daily 10)  Diclofenac Potassium 50 Mg Tabs (Diclofenac potassium) .Marland Kitchen.. 1 tab by mouth two times a day as needed knee pain 11)  Azithromycin 250 Mg Tabs (Azithromycin) .... 2 tabs by mouth today, then 1 tab by mouth daily for 4 more days  Patient Instructions: 1)  Nasal saline as needed  2)  Use your allergy meds regularly Prescriptions: AZITHROMYCIN 250 MG TABS (AZITHROMYCIN) 2 tabs by mouth today, then 1 tab by mouth daily for 4 more days  #6 x 0   Entered and Authorized by:   Julieanne Manson MD   Signed by:   Julieanne Manson MD on 01/04/2010   Method used:   Faxed to ...       [facility] (retail)       [address] [place], [place]  [address]       Ph: [phone]       Fax: [phone]   RxID:   [id number]   Appended Document: Ileene Musa PROBLEMS//MC    Clinical Lists Changes  Observations: Added new observation of INSTRUCTIONS: Saline nasal spray as needed Use allergy meds regularly Stop at front to get set up for ENT appt.  (01/04/2010 15:17)         Patient Instructions: 1)  Saline nasal spray as needed 2)  Use allergy meds regularly 3)  Stop at front to get set up for ENT appt.

## 2010-04-23 NOTE — Progress Notes (Signed)
Summary: REFILL   Phone Note Call from Patient Call back at Home Phone [phone]   Reason for Call: Refill Medication Summary of Call: Webber Michiels PT. MS Fulg[place]i SAYS THAT HER ZYRTEC WAS CALLED INTO [place] [facility], BUT SHE DIDN'T PICK IT UP ON TIME, SO SHE WAS TOLD THAT WE WOULD HAVE TO RECALL IT IN. Initial call taken by: Leodis Rains,  Aug 15, 2009 2:57 PM  Follow-up for Phone Call        Pt did not get rx that was sent in April so refaxed to [facility] pharmacy. Unable to tell pt as the phone number we have is not valid. Follow-up by: Vesta Mixer CMA,  Aug 15, 2009 4:04 PM    Prescriptions: XYZAL 5 MG TABS (LEVOCETIRIZINE DIHYDROCHLORIDE) 1 tab by mouth daily  #30 x 11   Entered by:   Vesta Mixer CMA   Authorized by:   Julieanne Manson MD   Signed by:   Vesta Mixer CMA on 08/15/2009   Method used:   Faxed to ...       [facility] (retail)       [address] [place], [place]  [address]       Ph: [phone]       Fax: [phone]   RxID:   [id number]

## 2010-06-03 LAB — DIFFERENTIAL
Basophils Absolute: 0 10*3/uL (ref 0.0–0.1)
Basophils Relative: 0 % (ref 0–1)
Eosinophils Absolute: 0.2 10*3/uL (ref 0.0–0.7)
Eosinophils Relative: 2 % (ref 0–5)
Lymphocytes Relative: 40 % (ref 12–46)
Lymphs Abs: 3.1 10*3/uL (ref 0.7–4.0)
Monocytes Absolute: 0.5 10*3/uL (ref 0.1–1.0)
Monocytes Relative: 6 % (ref 3–12)
Neutro Abs: 4 10*3/uL (ref 1.7–7.7)
Neutrophils Relative %: 51 % (ref 43–77)

## 2010-06-03 LAB — CBC
HCT: 35.8 % — ABNORMAL LOW (ref 36.0–46.0)
HCT: 36.9 % (ref 36.0–46.0)
Hemoglobin: 12 g/dL (ref 12.0–15.0)
Hemoglobin: 12.2 g/dL (ref 12.0–15.0)
MCH: 26.4 pg (ref 26.0–34.0)
MCH: 25.2 pg — ABNORMAL LOW (ref 26.0–34.0)
MCHC: 33.5 g/dL (ref 30.0–36.0)
MCHC: 33.1 g/dL (ref 30.0–36.0)
MCV: 78.9 fL (ref 78.0–100.0)
MCV: 76.2 fL — ABNORMAL LOW (ref 78.0–100.0)
Platelets: 233 10*3/uL (ref 150–400)
Platelets: 225 10*3/uL (ref 150–400)
RBC: 4.54 MIL/uL (ref 3.87–5.11)
RBC: 4.84 MIL/uL (ref 3.87–5.11)
RDW: 14.2 % (ref 11.5–15.5)
RDW: 14.7 % (ref 11.5–15.5)
WBC: 7.8 10*3/uL (ref 4.0–10.5)
WBC: 7.6 10*3/uL (ref 4.0–10.5)

## 2010-06-03 LAB — POCT I-STAT, CHEM 8
BUN: 14 mg/dL (ref 6–23)
Calcium, Ion: 1.11 mmol/L — ABNORMAL LOW (ref 1.12–1.32)
Chloride: 104 mEq/L (ref 96–112)
Creatinine, Ser: 0.9 mg/dL (ref 0.4–1.2)
Glucose, Bld: 110 mg/dL — ABNORMAL HIGH (ref 70–99)
HCT: 37 % (ref 36.0–46.0)
Hemoglobin: 12.6 g/dL (ref 12.0–15.0)
Potassium: 3.9 mEq/L (ref 3.5–5.1)
Sodium: 139 mEq/L (ref 135–145)
TCO2: 27 mmol/L (ref 0–100)

## 2010-06-26 LAB — CBC
HCT: 39.1 % (ref 36.0–46.0)
Hemoglobin: 13.1 g/dL (ref 12.0–15.0)
MCHC: 33.5 g/dL (ref 30.0–36.0)
MCV: 76.2 fL — ABNORMAL LOW (ref 78.0–100.0)
Platelets: 242 10*3/uL (ref 150–400)
RBC: 5.14 MIL/uL — ABNORMAL HIGH (ref 3.87–5.11)
RDW: 15.5 % (ref 11.5–15.5)
WBC: 6.9 10*3/uL (ref 4.0–10.5)

## 2010-06-26 LAB — D-DIMER, QUANTITATIVE: D-Dimer, Quant: 0.36 ug/mL-FEU (ref 0.00–0.48)

## 2010-06-26 LAB — POCT CARDIAC MARKERS
CKMB, poc: <1 ng/mL — ABNORMAL LOW (ref 1.0–8.0)
Myoglobin, poc: 73.6 ng/mL (ref 12–200)
Troponin i, poc: <0.05 ng/mL (ref 0.00–0.09)

## 2010-06-26 LAB — BASIC METABOLIC PANEL
BUN: 7 mg/dL (ref 6–23)
CO2: 28 mEq/L (ref 19–32)
Calcium: 8.8 mg/dL (ref 8.4–10.5)
Chloride: 98 mEq/L (ref 96–112)
Creatinine, Ser: 0.73 mg/dL (ref 0.4–1.2)
GFR calc Af Amer: >60 mL/min (ref >60)
GFR calc non Af Amer: >60 mL/min (ref >60)
Glucose, Bld: 125 mg/dL — ABNORMAL HIGH (ref 70–99)
Potassium: 3.7 mEq/L (ref 3.5–5.1)
Sodium: 135 mEq/L (ref 135–145)

## 2010-06-26 LAB — DIFFERENTIAL
Basophils Absolute: 0 10*3/uL (ref 0.0–0.1)
Basophils Relative: 0 % (ref 0–1)
Eosinophils Absolute: 0 10*3/uL (ref 0.0–0.7)
Eosinophils Relative: 1 % (ref 0–5)
Lymphocytes Relative: 43 % (ref 12–46)
Lymphs Abs: 3 10*3/uL (ref 0.7–4.0)
Monocytes Absolute: 0.3 10*3/uL (ref 0.1–1.0)
Monocytes Relative: 4 % (ref 3–12)
Neutro Abs: 3.6 10*3/uL (ref 1.7–7.7)
Neutrophils Relative %: 52 % (ref 43–77)

## 2010-06-26 LAB — URINALYSIS, ROUTINE W REFLEX MICROSCOPIC
Bilirubin Urine: NEGATIVE
Glucose, UA: NEGATIVE mg/dL
Hgb urine dipstick: NEGATIVE
Ketones, ur: NEGATIVE mg/dL
Nitrite: NEGATIVE
Protein, ur: NEGATIVE mg/dL
Specific Gravity, Urine: 1.019 (ref 1.005–1.030)
Urobilinogen, UA: 0.2 mg/dL (ref 0.0–1.0)
pH: 6 (ref 5.0–8.0)

## 2010-08-27 ENCOUNTER — Encounter: Payer: Self-pay | Admitting: Gastroenterology

## 2010-11-04 ENCOUNTER — Inpatient Hospital Stay (INDEPENDENT_AMBULATORY_CARE_PROVIDER_SITE_OTHER)
Admission: RE | Admit: 2010-11-04 | Discharge: 2010-11-04 | Disposition: A | Discharge status: Home / Self Care | Payer: Self-pay | Source: Ambulatory Visit | Attending: Family Medicine | Admitting: Family Medicine

## 2010-11-04 DIAGNOSIS — J069 Acute upper respiratory infection, unspecified: Secondary | ICD-10-CM

## 2010-11-22 ENCOUNTER — Emergency Department (HOSPITAL_COMMUNITY): Payer: Self-pay

## 2010-11-22 ENCOUNTER — Emergency Department (HOSPITAL_COMMUNITY)
Admission: EM | Admit: 2010-11-22 | Discharge: 2010-11-22 | Disposition: A | Discharge status: Home / Self Care | Payer: Self-pay | Attending: Emergency Medicine | Admitting: Emergency Medicine

## 2010-11-22 DIAGNOSIS — F3289 Other specified depressive episodes: Secondary | ICD-10-CM | POA: Insufficient documentation

## 2010-11-22 DIAGNOSIS — IMO0001 Reserved for inherently not codable concepts without codable children: Secondary | ICD-10-CM | POA: Insufficient documentation

## 2010-11-22 DIAGNOSIS — R059 Cough, unspecified: Secondary | ICD-10-CM | POA: Insufficient documentation

## 2010-11-22 DIAGNOSIS — R5381 Other malaise: Secondary | ICD-10-CM | POA: Insufficient documentation

## 2010-11-22 DIAGNOSIS — R631 Polydipsia: Secondary | ICD-10-CM | POA: Insufficient documentation

## 2010-11-22 DIAGNOSIS — R634 Abnormal weight loss: Secondary | ICD-10-CM | POA: Insufficient documentation

## 2010-11-22 DIAGNOSIS — R5383 Other fatigue: Secondary | ICD-10-CM | POA: Insufficient documentation

## 2010-11-22 DIAGNOSIS — J40 Bronchitis, not specified as acute or chronic: Secondary | ICD-10-CM | POA: Insufficient documentation

## 2010-11-22 DIAGNOSIS — R630 Anorexia: Secondary | ICD-10-CM | POA: Insufficient documentation

## 2010-11-22 DIAGNOSIS — F329 Major depressive disorder, single episode, unspecified: Secondary | ICD-10-CM | POA: Insufficient documentation

## 2010-11-22 DIAGNOSIS — R05 Cough: Secondary | ICD-10-CM | POA: Insufficient documentation

## 2010-11-22 DIAGNOSIS — E669 Obesity, unspecified: Secondary | ICD-10-CM | POA: Insufficient documentation

## 2010-11-22 DIAGNOSIS — R3589 Other polyuria: Secondary | ICD-10-CM | POA: Insufficient documentation

## 2010-11-22 DIAGNOSIS — R358 Other polyuria: Secondary | ICD-10-CM | POA: Insufficient documentation

## 2010-11-22 DIAGNOSIS — F411 Generalized anxiety disorder: Secondary | ICD-10-CM | POA: Insufficient documentation

## 2010-11-22 LAB — URINALYSIS, ROUTINE W REFLEX MICROSCOPIC
Bilirubin Urine: NEGATIVE
Glucose, UA: NEGATIVE mg/dL
Hgb urine dipstick: NEGATIVE
Ketones, ur: NEGATIVE mg/dL
Nitrite: NEGATIVE
Protein, ur: NEGATIVE mg/dL
Specific Gravity, Urine: 1.025 (ref 1.005–1.030)
Urobilinogen, UA: 0.2 mg/dL (ref 0.0–1.0)
pH: 5.5 (ref 5.0–8.0)

## 2010-11-22 LAB — URINE MICROSCOPIC-ADD ON

## 2010-11-22 LAB — DIFFERENTIAL
Basophils Absolute: 0 10*3/uL (ref 0.0–0.1)
Basophils Relative: 1 % (ref 0–1)
Eosinophils Absolute: 0.2 10*3/uL (ref 0.0–0.7)
Eosinophils Relative: 3 % (ref 0–5)
Lymphocytes Relative: 44 % (ref 12–46)
Lymphs Abs: 3.3 10*3/uL (ref 0.7–4.0)
Monocytes Absolute: 0.6 10*3/uL (ref 0.1–1.0)
Monocytes Relative: 8 % (ref 3–12)
Neutro Abs: 3.4 10*3/uL (ref 1.7–7.7)
Neutrophils Relative %: 45 % (ref 43–77)

## 2010-11-22 LAB — POCT I-STAT, CHEM 8
BUN: 9 mg/dL (ref 6–23)
Calcium, Ion: 1.23 mmol/L (ref 1.12–1.32)
Chloride: 106 mEq/L (ref 96–112)
Creatinine, Ser: 0.9 mg/dL (ref 0.50–1.10)
Glucose, Bld: 79 mg/dL (ref 70–99)
HCT: 39 % (ref 36.0–46.0)
Hemoglobin: 13.3 g/dL (ref 12.0–15.0)
Potassium: 3.7 mEq/L (ref 3.5–5.1)
Sodium: 140 mEq/L (ref 135–145)
TCO2: 25 mmol/L (ref 0–100)

## 2010-11-22 LAB — CBC
HCT: 36.8 % (ref 36.0–46.0)
Hemoglobin: 12.5 g/dL (ref 12.0–15.0)
MCH: 25.8 pg — ABNORMAL LOW (ref 26.0–34.0)
MCHC: 34 g/dL (ref 30.0–36.0)
MCV: 75.9 fL — ABNORMAL LOW (ref 78.0–100.0)
Platelets: 213 10*3/uL (ref 150–400)
RBC: 4.85 MIL/uL (ref 3.87–5.11)
RDW: 14.1 % (ref 11.5–15.5)
WBC: 7.6 10*3/uL (ref 4.0–10.5)

## 2010-12-07 ENCOUNTER — Inpatient Hospital Stay (HOSPITAL_COMMUNITY): Payer: Self-pay

## 2010-12-07 ENCOUNTER — Inpatient Hospital Stay (HOSPITAL_COMMUNITY)
Admission: AD | Admit: 2010-12-07 | Discharge: 2010-12-07 | Disposition: A | Discharge status: Other Acute Inpatient Hospital | Payer: Self-pay | Source: Ambulatory Visit | Attending: Obstetrics and Gynecology | Admitting: Obstetrics and Gynecology

## 2010-12-07 ENCOUNTER — Encounter (HOSPITAL_COMMUNITY): Payer: Self-pay

## 2010-12-07 ENCOUNTER — Inpatient Hospital Stay (HOSPITAL_COMMUNITY)
Admission: EM | Admit: 2010-12-07 | Discharge: 2010-12-16 | DRG: 885 | Disposition: A | Discharge status: Home / Self Care | Payer: Self-pay | Source: Other Acute Inpatient Hospital | Attending: Psychiatry | Admitting: Psychiatry

## 2010-12-07 DIAGNOSIS — F3289 Other specified depressive episodes: Secondary | ICD-10-CM | POA: Insufficient documentation

## 2010-12-07 DIAGNOSIS — Z818 Family history of other mental and behavioral disorders: Secondary | ICD-10-CM

## 2010-12-07 DIAGNOSIS — L293 Anogenital pruritus, unspecified: Secondary | ICD-10-CM | POA: Insufficient documentation

## 2010-12-07 DIAGNOSIS — D259 Leiomyoma of uterus, unspecified: Secondary | ICD-10-CM | POA: Insufficient documentation

## 2010-12-07 DIAGNOSIS — N949 Unspecified condition associated with female genital organs and menstrual cycle: Secondary | ICD-10-CM | POA: Insufficient documentation

## 2010-12-07 DIAGNOSIS — Z79899 Other long term (current) drug therapy: Secondary | ICD-10-CM

## 2010-12-07 DIAGNOSIS — R102 Pelvic and perineal pain: Secondary | ICD-10-CM

## 2010-12-07 DIAGNOSIS — F418 Other specified anxiety disorders: Secondary | ICD-10-CM

## 2010-12-07 DIAGNOSIS — F329 Major depressive disorder, single episode, unspecified: Secondary | ICD-10-CM | POA: Insufficient documentation

## 2010-12-07 DIAGNOSIS — F339 Major depressive disorder, recurrent, unspecified: Principal | ICD-10-CM

## 2010-12-07 DIAGNOSIS — Z560 Unemployment, unspecified: Secondary | ICD-10-CM

## 2010-12-07 HISTORY — DX: Depression, unspecified: F32.A

## 2010-12-07 HISTORY — DX: Benign neoplasm of connective and other soft tissue, unspecified: D21.9

## 2010-12-07 HISTORY — DX: Major depressive disorder, single episode, unspecified: F32.9

## 2010-12-07 HISTORY — DX: Anxiety disorder, unspecified: F41.9

## 2010-12-07 LAB — URINALYSIS, ROUTINE W REFLEX MICROSCOPIC
Bilirubin Urine: NEGATIVE
Glucose, UA: NEGATIVE mg/dL
Hgb urine dipstick: NEGATIVE
Ketones, ur: NEGATIVE mg/dL
Nitrite: NEGATIVE
Protein, ur: NEGATIVE mg/dL
Specific Gravity, Urine: 1.015 (ref 1.005–1.030)
Urobilinogen, UA: 0.2 mg/dL (ref 0.0–1.0)
pH: 7 (ref 5.0–8.0)

## 2010-12-07 LAB — URINE MICROSCOPIC-ADD ON

## 2010-12-07 LAB — WET PREP, GENITAL
Trich, Wet Prep: NONE SEEN
Yeast Wet Prep HPF POC: NONE SEEN

## 2010-12-07 LAB — POCT PREGNANCY, URINE: Preg Test, Ur: NEGATIVE

## 2010-12-07 MED ORDER — KETOROLAC TROMETHAMINE 60 MG/2ML IM SOLN: 60.0000 mg | Freq: Once | INTRAMUSCULAR | Status: DC

## 2010-12-07 MED ORDER — KETOROLAC TROMETHAMINE 60 MG/2ML IM SOLN
60.0000 mg | Freq: Once | INTRAMUSCULAR | Status: AC
  Administered 2010-12-07: 60 mg via INTRAMUSCULAR
  Filled 2010-12-07: qty 2

## 2010-12-07 MED ORDER — OXYCODONE-ACETAMINOPHEN 5-325 MG PO TABS
2.0000 | ORAL_TABLET | Freq: Once | ORAL | Status: AC
  Administered 2010-12-07: 2 via ORAL
  Filled 2010-12-07: qty 2

## 2010-12-07 MED ORDER — ONDANSETRON 8 MG PO TBDP
8.0000 mg | ORAL_TABLET | Freq: Once | ORAL | Status: AC
  Administered 2010-12-07: 8 mg via ORAL
  Filled 2010-12-07: qty 1

## 2010-12-07 MED ORDER — NYSTATIN-TRIAMCINOLONE 100000-0.1 UNIT/GM-% EX OINT
TOPICAL_OINTMENT | Freq: Two times a day (BID) | CUTANEOUS | Status: DC
  Filled 2010-12-07: qty 15
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
## 2010-12-07 NOTE — Progress Notes (Signed)
ACT team paged at [phone] per provider request.

## 2010-12-07 NOTE — Progress Notes (Signed)
ACT team returned call & states she will come see this patient when she's done with her current patient in a "couple of hours".

## 2010-12-07 NOTE — Progress Notes (Signed)
Patient has fibroids having pelvic pain, feels like may have UTI, had period last week LMP 11/25/10 stopped bleeding on 11/29/10, then started cramping and bleeding again, crying spells, depression.

## 2010-12-08 DIAGNOSIS — F339 Major depressive disorder, recurrent, unspecified: Secondary | ICD-10-CM

## 2010-12-08 DIAGNOSIS — F411 Generalized anxiety disorder: Secondary | ICD-10-CM

## 2010-12-08 LAB — COMPREHENSIVE METABOLIC PANEL
ALT: 10 U/L (ref 0–35)
AST: 14 U/L (ref 0–37)
Albumin: 3.7 g/dL (ref 3.5–5.2)
Alkaline Phosphatase: 63 U/L (ref 39–117)
BUN: 11 mg/dL (ref 6–23)
CO2: 27 mEq/L (ref 19–32)
Calcium: 9.4 mg/dL (ref 8.4–10.5)
Chloride: 101 mEq/L (ref 96–112)
Creatinine, Ser: 0.78 mg/dL (ref 0.50–1.10)
GFR calc Af Amer: >60 mL/min (ref >60)
GFR calc non Af Amer: >60 mL/min (ref >60)
Glucose, Bld: 114 mg/dL — ABNORMAL HIGH (ref 70–99)
Potassium: 3.7 mEq/L (ref 3.5–5.1)
Sodium: 139 mEq/L (ref 135–145)
Total Bilirubin: 0.1 mg/dL — ABNORMAL LOW (ref 0.3–1.2)
Total Protein: 7.7 g/dL (ref 6.0–8.3)

## 2010-12-08 LAB — CBC
HCT: 37.6 % (ref 36.0–46.0)
Hemoglobin: 12.5 g/dL (ref 12.0–15.0)
MCH: 25.7 pg — ABNORMAL LOW (ref 26.0–34.0)
MCHC: 33.2 g/dL (ref 30.0–36.0)
MCV: 77.2 fL — ABNORMAL LOW (ref 78.0–100.0)
Platelets: 239 10*3/uL (ref 150–400)
RBC: 4.87 MIL/uL (ref 3.87–5.11)
RDW: 14.7 % (ref 11.5–15.5)
WBC: 7.4 10*3/uL (ref 4.0–10.5)

## 2010-12-08 LAB — DIFFERENTIAL
Basophils Absolute: 0 10*3/uL (ref 0.0–0.1)
Basophils Relative: 0 % (ref 0–1)
Eosinophils Absolute: 0.1 10*3/uL (ref 0.0–0.7)
Eosinophils Relative: 2 % (ref 0–5)
Lymphocytes Relative: 38 % (ref 12–46)
Lymphs Abs: 2.8 10*3/uL (ref 0.7–4.0)
Monocytes Absolute: 0.5 10*3/uL (ref 0.1–1.0)
Monocytes Relative: 7 % (ref 3–12)
Neutro Abs: 4 10*3/uL (ref 1.7–7.7)
Neutrophils Relative %: 54 % (ref 43–77)

## 2010-12-08 LAB — TSH: TSH: 1.945 u[IU]/mL (ref 0.350–4.500)

## 2010-12-08 LAB — LITHIUM LEVEL: Lithium Lvl: 0.27 mEq/L — ABNORMAL LOW (ref 0.80–1.40)

## 2010-12-09 ENCOUNTER — Encounter (HOSPITAL_COMMUNITY): Payer: Self-pay

## 2010-12-09 ENCOUNTER — Ambulatory Visit (HOSPITAL_COMMUNITY)
Admit: 2010-12-09 | Discharge: 2010-12-09 | Disposition: A | Discharge status: Home / Self Care | Payer: Self-pay | Attending: Psychiatry | Admitting: Psychiatry

## 2010-12-09 DIAGNOSIS — M25559 Pain in unspecified hip: Secondary | ICD-10-CM | POA: Insufficient documentation

## 2010-12-09 DIAGNOSIS — N949 Unspecified condition associated with female genital organs and menstrual cycle: Secondary | ICD-10-CM | POA: Insufficient documentation

## 2010-12-09 LAB — GC/CHLAMYDIA PROBE AMP, GENITAL
Chlamydia, DNA Probe: NEGATIVE
GC Probe Amp, Genital: NEGATIVE

## 2010-12-09 LAB — T4, FREE: Free T4: 0.69 ng/dL — ABNORMAL LOW (ref 0.80–1.80)

## 2010-12-09 LAB — T3, FREE: T3, Free: 2.4 pg/mL (ref 2.3–4.2)

## 2010-12-09 LAB — LITHIUM LEVEL: Lithium Lvl: 0.39 mEq/L — ABNORMAL LOW (ref 0.80–1.40)

## 2010-12-09 LAB — TSH: TSH: 1.824 u[IU]/mL (ref 0.350–4.500)

## 2010-12-09 MED ORDER — IOHEXOL 300 MG/ML  SOLN
100.0000 mL | Freq: Once | INTRAMUSCULAR | Status: AC | PRN
  Administered 2010-12-09: 100 mL via INTRAVENOUS

## 2010-12-09 NOTE — Assessment & Plan Note (Signed)
NAMECAYLYNN, [name] NO.:  [id number]  MEDICAL RECORD NO.:  [id number]  LOCATION:  0500                          FACILITY:  [facility]  PHYSICIAN:  Franchot Gallo, MD     DATE OF BIRTH:  Dec 11, 1960  DATE OF ADMISSION:  12/07/2010 DATE OF DISCHARGE:                      PSYCHIATRIC ADMISSION ASSESSMENT   IDENTIFYING INFORMATION:  This is a 50 year old female who was voluntarily admitted on December 07, 2010.  HISTORY OF PRESENT ILLNESS:  The patient was a transfer from [facility] after she had presented there for abdominal pain.  She was reporting feeling as she was going to have a "nervous breakdown."  She had an ultrasound and exam which showed that she had multiple uterine fibroids.  She states that she has been having episodes of crying, racing thoughts and endorsing problems with her sleep.  She also reports one of her stressors is her financial issues, missing her daughter who is currently at college and unemployment.  PAST PSYCHIATRIC HISTORY:  The patient was hospitalized years ago for depression.  She has a client of [facility] and being treated for ADHD, was given a diagnosis of major depressive disorder, severe, several years ago.  She was hospitalized in [facility] in February for suicidal thoughts and homicidal ideation.  She has been on Cymbalta in the past but reports that she had gotten hostile on this medication.  SOCIAL HISTORY:  This is of 50 year old divorced female.  She has two adult children.  She lives in [place].  She lives alone.  She has been unemployed since 2011, where she worked at [employer] and states that she was fired due to violent behavior.  FAMILY HISTORY:  First cousin with a history bipolar disorder.  Reports her grandfather with history of being institutionalized.  Alcohol and drug history denies.  PRIMARY CARE PROVIDER:  [facility].  MEDICAL PROBLEMS:  Multiple uterine fibroids.  MEDICATIONS: 1.  Prozac 40 mg daily. 2. Lithium carbonate 300 mg in the morning, 600 in the evening. 3. Seroquel 25 mg at bedtime. 4. Hydroxyzine 25 mg one at bedtime p.r.n. 5. Vitamin B complex daily.  The patient has was on Klonopin prior but was stopped by the [facility] and she stopped taking her Adderall on her own.  DRUG ALLERGIES:  No known allergies.  PHYSICAL EXAMINATION:  GENERAL:  This is a middle-aged female, appears well-nourished in no distress.  She offers no complaints at this time. Her TSH is 1.945.  Her lithium level was subtherapeutic at 0.27.  Urine pregnancy test was negative.  Urinalysis shows small leukocytes.  MENTAL STATUS EXAM:  The patient is fully alert and cooperative with good eye contact.  She is at this time, dressed in hospital attire.  Her speech is clear, normal pace and tone, not rapid, not pressured.  Her mood is depressed, she appears somewhat sad.  She denies any suicidal or homicidal thoughts, wants to get back on her medications.  She is having futuristic goals already.  She shows no signs of any psychotic symptoms. Her cognitive functioning intact.  Judgment and insight appear to be good.  IMPRESSION:  Axis I:  Major depressive disorder recurrent. Axis II:  Deferred. Axis III:  History of uterine fibroids. Axis IV:  Problems with occupation, economic issues, psychosocial problems, having poor support. Axis V:  Current is 35-40.  PLAN:  Is to resume her Prozac and her lithium, reinforce med compliance.  Case manager to obtain her follow-up and will also contact her support group for concerns and safety issues.  Her tentative length of stay at this time is 3-5 days.     Landry Corporal, N.P.   ______________________________ Franchot Gallo, MD    JO/MEDQ  D:  12/08/2010  T:  12/08/2010  Job:  [id number]  Electronically Signed by Limmie Patricia.P. on 12/09/2010 09:30:29 AM Electronically Signed by Franchot Gallo MD on 12/09/2010 47:82:95 PM

## 2010-12-12 NOTE — ED Provider Notes (Signed)
Agree with above note.  [name] 12/12/2010 9:53 PM   

## 2010-12-14 LAB — URINALYSIS, ROUTINE W REFLEX MICROSCOPIC
Bilirubin Urine: NEGATIVE
Glucose, UA: NEGATIVE mg/dL
Hgb urine dipstick: NEGATIVE
Ketones, ur: NEGATIVE mg/dL
Nitrite: NEGATIVE
Protein, ur: NEGATIVE mg/dL
Specific Gravity, Urine: 1.009 (ref 1.005–1.030)
Urobilinogen, UA: 0.2 mg/dL (ref 0.0–1.0)
pH: 7 (ref 5.0–8.0)

## 2010-12-14 LAB — LITHIUM LEVEL: Lithium Lvl: 0.91 mEq/L (ref 0.80–1.40)

## 2010-12-14 LAB — URINE MICROSCOPIC-ADD ON

## 2010-12-17 LAB — POCT URINALYSIS DIP (DEVICE)
Bilirubin Urine: NEGATIVE
Glucose, UA: NEGATIVE
Hgb urine dipstick: NEGATIVE
Ketones, ur: NEGATIVE
Nitrite: NEGATIVE
Operator id: 239701
Protein, ur: NEGATIVE
Specific Gravity, Urine: 1.025
Urobilinogen, UA: 0.2
pH: 5.5

## 2010-12-26 NOTE — Discharge Summary (Signed)
[name], MORRISETTE NO.:  [id number]  MEDICAL RECORD NO.:  [id number]  LOCATION:  0501                          FACILITY:  [facility]  PHYSICIAN:  Franchot Gallo, MD     DATE OF BIRTH:  1960-05-01  DATE OF ADMISSION:  12/07/2010 DATE OF DISCHARGE:  12/16/2010                              DISCHARGE SUMMARY   REASON FOR ADMISSION:  This is a 50 year old female that was a transfer from [facility] after she had presented there for abdominal pain, reporting that she felt like she was going to have a "nervous breakdown."  They had done an ultrasound which showed multiple uterine fibroids.  The patient was having episodes of crying, racing thoughts, and endorsing problems with sleep and having stressors with finances and missing her daughter.  FINAL IMPRESSION:  AXIS I:  Major depressive disorder recurrent.  AXIS II: No diagnosis.  AXIS III: History of uterine fibroids.  AXIS IV: Unemployment, financial stressors.  AXIS V: GAF at discharge 60-65.  PERTINENT LABS:  A lithium level was subtherapeutic at 0.27.  Urine pregnancy test was negative.  TSH is 1.95.  SIGNIFICANT FINDINGS:  The patient was admitted to the adult milieu for safety and stabilization.  We resumed her Prozac and lithium, reinforced med compliance.  The patient was reporting sleep was improved with receiving Vistaril.  She still had some nausea and rating her depression at 10 on a scale of 1-10.  We increased her Seroquel for mood stabilization.  The patient was active in attending groups but presented with a flat affect and depressed mood.  She shared her loss of her mother and children and how it affected her.  She continued to report racing thoughts but having no auditory hallucinations, having no medication side effects, rating her hopelessness at 10 on a scale of 1- 10.  We increased her lithium to 600 mg b.i.d. for mood stabilization and had Klonopin available for anxiety.  She reported  problems with her sleep and appetite but reporting no suicidal or homicidal thoughts.  Her hopelessness was getting better, rating it a 5 on a scale of 1-10.  We had Rozerem available for sleep and increased her Prozac to lessen her depressive symptoms.  She was beginning to feel better, having improving insight.  The patient was reporting frequent awakenings and sleep, so we discontinued her Rozerem and had Ambien for sleep.  Her depression was improving, rating it a 2.  Anxiety was rated a 5 on a scale of 1-10. Her lithium level was at 0.91.  On the day of discharge, the patient was improving.  She had processed her thoughts and feelings about her depression, identified her unemployment and isolation as a part of her mood.  She was planning to attend support groups with others.  She understood her personal risk factors for suicide and was able to discuss her resources and plan if she was to get into a crisis again.  She denied any suicidal or homicidal thoughts and having no thoughts of self-harm.  She had a safety plan and would call the crisis hotline if thoughts were to reoccur.  DISCHARGE MEDICATIONS: 1. Albuterol 2 puffs as needed. 2. Abilify 5  mg at bedtime. 3. Klonopin 0.5 mg b.i.d. 4. Prozac 20 mg taking 3 daily. 5. Lithium carbonate 300 mg, taking 2 b.i.d. with meals. 6. Protonix 40 mg daily. 7. Ambien for sleep. 8. Aspirin 81 mg daily. 9. Nabumetone 500 mg for pain and 500 mg b.i.d.  Her discharge follow-up was with [facility] at [phone].  The patient also had to have the ACT team.  The patient was to follow up with [facility] for continued complaints of pelvic pain.     Landry Corporal, N.P.   ______________________________ Franchot Gallo, MD    JO/MEDQ  D:  12/24/2010  T:  12/24/2010  Job:  [id number]  Electronically Signed by Limmie PatriciaP. on 12/25/2010 09:42:27 AM Electronically Signed by Franchot Gallo MD on 12/26/2010 08:25:25 AM

## 2011-01-06 ENCOUNTER — Encounter: Payer: Self-pay | Admitting: Obstetrics and Gynecology

## 2011-01-09 ENCOUNTER — Ambulatory Visit: Payer: Self-pay | Admitting: Obstetrics & Gynecology

## 2011-08-20 ENCOUNTER — Encounter (HOSPITAL_COMMUNITY): Payer: Self-pay | Admitting: Emergency Medicine

## 2011-08-20 ENCOUNTER — Emergency Department (HOSPITAL_COMMUNITY)
Admission: EM | Admit: 2011-08-20 | Discharge: 2011-08-20 | Disposition: A | Discharge status: Home / Self Care | Payer: Self-pay | Attending: Emergency Medicine | Admitting: Emergency Medicine

## 2011-08-20 DIAGNOSIS — F319 Bipolar disorder, unspecified: Secondary | ICD-10-CM

## 2011-08-20 DIAGNOSIS — F329 Major depressive disorder, single episode, unspecified: Secondary | ICD-10-CM

## 2011-08-20 DIAGNOSIS — F3130 Bipolar disorder, current episode depressed, mild or moderate severity, unspecified: Secondary | ICD-10-CM | POA: Insufficient documentation

## 2011-08-20 DIAGNOSIS — F32A Depression, unspecified: Secondary | ICD-10-CM

## 2011-08-20 LAB — COMPREHENSIVE METABOLIC PANEL
ALT: 8 U/L (ref 0–35)
AST: 19 U/L (ref 0–37)
Albumin: 3.8 g/dL (ref 3.5–5.2)
Alkaline Phosphatase: 54 U/L (ref 39–117)
BUN: 9 mg/dL (ref 6–23)
CO2: 25 mEq/L (ref 19–32)
Calcium: 9.9 mg/dL (ref 8.4–10.5)
Chloride: 102 mEq/L (ref 96–112)
Creatinine, Ser: 0.77 mg/dL (ref 0.50–1.10)
GFR calc Af Amer: >90 mL/min (ref >90)
GFR calc non Af Amer: >90 mL/min (ref >90)
Glucose, Bld: 76 mg/dL (ref 70–99)
Potassium: 3.7 mEq/L (ref 3.5–5.1)
Sodium: 138 mEq/L (ref 135–145)
Total Bilirubin: 0.2 mg/dL — ABNORMAL LOW (ref 0.3–1.2)
Total Protein: 7.9 g/dL (ref 6.0–8.3)

## 2011-08-20 LAB — DIFFERENTIAL
Basophils Absolute: 0 10*3/uL (ref 0.0–0.1)
Basophils Relative: 0 % (ref 0–1)
Eosinophils Absolute: 0.1 10*3/uL (ref 0.0–0.7)
Eosinophils Relative: 2 % (ref 0–5)
Lymphocytes Relative: 36 % (ref 12–46)
Lymphs Abs: 2.6 10*3/uL (ref 0.7–4.0)
Monocytes Absolute: 0.5 10*3/uL (ref 0.1–1.0)
Monocytes Relative: 7 % (ref 3–12)
Neutro Abs: 3.9 10*3/uL (ref 1.7–7.7)
Neutrophils Relative %: 55 % (ref 43–77)

## 2011-08-20 LAB — CBC
HCT: 38.4 % (ref 36.0–46.0)
Hemoglobin: 12.7 g/dL (ref 12.0–15.0)
MCH: 25.8 pg — ABNORMAL LOW (ref 26.0–34.0)
MCHC: 33.1 g/dL (ref 30.0–36.0)
MCV: 78 fL (ref 78.0–100.0)
Platelets: 239 10*3/uL (ref 150–400)
RBC: 4.92 MIL/uL (ref 3.87–5.11)
RDW: 14.3 % (ref 11.5–15.5)
WBC: 7.1 10*3/uL (ref 4.0–10.5)

## 2011-08-20 LAB — RAPID URINE DRUG SCREEN, HOSP PERFORMED
Amphetamines: NOT DETECTED
Barbiturates: NOT DETECTED
Benzodiazepines: NOT DETECTED
Cocaine: NOT DETECTED
Opiates: NOT DETECTED
Tetrahydrocannabinol: NOT DETECTED

## 2011-08-20 LAB — LITHIUM LEVEL: Lithium Lvl: 0.86 mEq/L (ref 0.80–1.40)

## 2011-08-20 MED ORDER — FLUOXETINE HCL 20 MG PO TABS: 40.0000 mg | ORAL_TABLET | Freq: Every day | ORAL | Status: DC

## 2011-08-20 NOTE — ED Provider Notes (Cosign Needed)
History   This chart was scribed for Flint Melter, MD by Brooks Sailors. The patient was seen in room STRE8/STRE8. Patient's care was started at 1143.   CSN: [id number]  Arrival date & time 08/20/11  1143   First MD Initiated Contact with Patient 08/20/11 1405      Chief Complaint  Patient presents with  . Depression    (Consider location/radiation/quality/duration/timing/severity/associated sxs/prior treatment) HPI  [name] is a 51 y.o. female who presents to the Emergency Department with depression. Pt says symptoms start right before patient starts menstrual cycle. Pt c/o constant anger, depression, and crying spells. Pt recently saw therapist one week ago. Pt notes she lashes out on anyone around her and no one specifically. Pt with history of Depression and anxiety. Pt says many times stress at work precedes they symptoms. Patient denies SI, but has cut her wrists in the past.  Wishes to have her hormonal levels checked. Next menstrual period will be around June 1st.   Past Medical History  Diagnosis Date  . Depression   . Anxiety   . Fibroid   . Chlamydia   . Trichomonas   . IBS (irritable bowel syndrome)   . Arthritis     Past Surgical History  Procedure Date  . No past surgeries surgery for abnormal vaginal bleeding  . Labial cyst removal     Family History  Problem Relation Age of Onset  . Diabetes Father   . Diabetes Mother   . Hypertension Mother   . Deep vein thrombosis Mother     History  Substance Use Topics  . Smoking status: Never Smoker   . Smokeless tobacco: Not on file  . Alcohol Use: No    OB History    Grav Para Term Preterm Abortions TAB SAB Ect Mult Living   4 2 0 0 2 0 2   2      Review of Systems  All other systems reviewed and are negative.    Allergies  Review of patient's allergies indicates no known allergies.  Home Medications   Current Outpatient Rx  Name Route Sig Dispense Refill  . ASPIRIN 81 MG PO  TABS Oral Take 81 mg by mouth daily.      Marland Kitchen CALCIUM CARBONATE-VITAMIN D 500-200 MG-UNIT PO TABS Oral Take 1 tablet by mouth daily.    Marland Kitchen CLONAZEPAM 0.5 MG PO TABS Oral Take 0.5 mg by mouth 3 (three) times daily as needed. For anxiety.    . IBUPROFEN 200 MG PO CAPS Oral Take 2 capsules by mouth daily as needed. For pain    . GERITOL TONIC PO LIQD Oral Take 15 mLs by mouth daily. For energy    . LITHIUM CARBONATE 300 MG PO CAPS Oral Take 600 mg by mouth 2 (two) times daily with a meal.     . MELATONIN 3 MG PO TABS Oral Take 1 tablet by mouth 3 times/day as needed-between meals & bedtime. For insomnia.    Marland Kitchen VITAMIN A 40981 UNITS PO CAPS Oral Take 10,000 Units by mouth daily as needed. As needed    . VITAMIN B-12 500 MCG PO TABS Oral Take 500 mcg by mouth daily.      Marland Kitchen ZOLPIDEM TARTRATE 5 MG PO TABS Oral Take 5 mg by mouth at bedtime as needed. For sleep    . FLUOXETINE HCL 20 MG PO TABS Oral Take 2 tablets (40 mg total) by mouth daily. 60 tablet 3  BP 119/82  Pulse 100  Temp(Src) 99 F (37.2 C) (Oral)  Resp 16  Ht 5\' 6"  (1.676 m)  Wt 210 lb (95.255 kg)  BMI 33.89 kg/m2  SpO2 97%  LMP 07/26/2011  Physical Exam  Nursing note and vitals reviewed. Constitutional: She is oriented to person, place, and time. She appears well-developed and well-nourished.  HENT:  Head: Normocephalic and atraumatic.  Eyes: Conjunctivae and EOM are normal. Pupils are equal, round, and reactive to light.  Neck: Normal range of motion and phonation normal. Neck supple.  Cardiovascular: Normal rate, regular rhythm and intact distal pulses.   Pulmonary/Chest: Effort normal and breath sounds normal. She exhibits no tenderness.  Abdominal: Soft. She exhibits no distension. There is no tenderness. There is no guarding.  Musculoskeletal: Normal range of motion.  Neurological: She is alert and oriented to person, place, and time. She has normal strength. She exhibits normal muscle tone.  Skin: Skin is warm and dry.    Psychiatric: Judgment and thought content normal.       Depressed and anxious.     ED Course  Procedures (including critical care time)  1422 Pt seen and evaluated, to have blood work done and litium   Telepsych consultation: Dr. Leretha Pol, felt that the patient could be discharged to home and followup with her regular psychiatrist, and crisis team. She also recommended increasing the Prozac to 40 mg each morning  Reevaluation, 19:42- patient is calm, and comfortable. She has eaten. She expressed no further concerns.   Labs Reviewed  CBC - Abnormal; Notable for the following:    MCH 25.8 (*)    All other components within normal limits  COMPREHENSIVE METABOLIC PANEL - Abnormal; Notable for the following:    Total Bilirubin 0.2 (*)    All other components within normal limits  DIFFERENTIAL  URINE RAPID DRUG SCREEN (HOSP PERFORMED)  LITHIUM LEVEL   No results found.   1. Depression   2. Bipolar disorder       MDM  Depression without suicidal ideation. No worrisome homicidal ideation. Patient is stable in emergency department. She has been evaluated by a psychiatrist to telemetry.Doubt metabolic instability, serious bacterial infection or impending vascular collapse; the patient is stable for discharge.   Plan: Home Medications- increase Prozac; Home Treatments- f/u Psychiatry as OP; Recommended follow up- Return prn   I personally performed the services described in this documentation, which was scribed in my presence. The recorded information has been reviewed and considered.     Flint Melter, MD 08/20/11 [phone]

## 2011-08-20 NOTE — ED Notes (Signed)
Patient ate 90% of meal.

## 2011-08-20 NOTE — Discharge Instructions (Signed)
Depression You have signs of depression. This is a common problem. It can occur at any age. It is often hard to recognize. People can suffer from depression and still have moments of enjoyment. Depression interferes with your basic ability to function in life. It upsets your relationships, sleep, eating, and work habits. CAUSES  Depression is believed to be caused by an imbalance in brain chemicals. It may be triggered by an unpleasant event. Relationship crises, a death in the family, financial worries, retirement, or other stressors are normal causes of depression. Depression may also start for no known reason. Other factors that may play a part include medical illnesses, some medicines, genetics, and alcohol or drug abuse. SYMPTOMS   Feeling unhappy or worthless.   Long-lasting (chronic) tiredness or worn-out feeling.   Self-destructive thoughts and actions.   Not being able to sleep or sleeping too much.   Eating more than usual or not eating at all.   Headaches or feeling anxious.   Trouble concentrating or making decisions.   Unexplained physical problems and substance abuse.  TREATMENT  Depression usually gets better with treatment. This can include:  Antidepressant medicines. It can take weeks before the proper dose is achieved and benefits are reached.   Talking with a therapist, clergyperson, counselor, or friend. These people can help you gain insight into your problem and regain control of your life.   Eating a good diet.   Getting regular physical exercise, such as walking for 30 minutes every day.   Not abusing alcohol or drugs.  Treating depression often takes 6 months or longer. This length of treatment is needed to keep symptoms from returning. Call your caregiver and arrange for follow-up care as suggested. SEEK IMMEDIATE MEDICAL CARE IF:   You start to have thoughts of hurting yourself or others.   Call your local emergency services (911 in [place]   Go to  your local medical emergency department.   Call the National Suicide Prevention Lifeline: 1-800-273-TALK 431-631-3875).  Document Released: 03/10/2005 Document Revised: 02/27/2011 Document Reviewed: 08/10/2009 [facility] Patient Information 2012 [place], [place].Manic Depression (Bipolar Disorder) Bipolar disorder is also known as manic depressive illness. It is when the brain does not function properly and causes shifts in a person's moods, energy and ability to function in everyday life. These shifts are different from the normal ups and downs that everyone experiences. Instead the shifts are severe. If this goes untreated, the person's life becomes more and more disorderly. People with this disorder can be treated can lead full and productive lives. This disorder must be managed throughout life.  SYMPTOMS   Bipolar disorder causes dramatic mood swings. These mood swings go in cycles. They cycle from extreme "highs" and irritable to deep "lows" of sadness and hopelessness.   Between the extreme moods, there are usually periods of normal mood.   Along with the mood shifts, the person will have severe changes in energy and behavior. The periods of "highs" and "lows" are called episodes of mania and depression.  Signs of mania:  Lots of energy, activity and restlessness.   Extreme "high" or good mood.   Extreme irritability.   Racing thoughts and talking very fast.   Jumping from one idea to another.   Not able to focus, easily distracted.   Little need to sleep.   Grand beliefs in one's abilities and powers.   Spending sprees.   Increased sexual drive. This can result in many sexual partners.   Poor judgment.  Abuse of drugs, particularly cocaine, alcohol, and sleeping medication.   Aggressive or provocative behavior.   A lasting period of behavior that is different from usual.   Denial that anything is wrong.  *A manic episode is identified if a "high" mood happens with  three or more of the other symptoms lasting most of the day, nearly everyday for a week or longer. If the mood is more irritable in nature, four additional symptoms must be present. Signs of depression:  Lasting feelings of sadness, anxiety, or empty mood.   Feelings of hopelessness with negative thoughts.   Feelings of guilt, worthlessness, or helplessness.   Loss of interest or pleasure in activities once enjoyed, including sex.   Feelings of fatigue or having less energy.   Trouble focusing, making decisions, remembering.   Feeling restless or irritable.   Sleeping too little or too much.   Change in eating with possible weight gain or loss.   Feeling ongoing pain that is not caused by physical illness or injury.   Thoughts of death or suicide or suicide attempts.  *A depressive episode is identified as having five or more of the above symptoms that last most of the day, nearly everyday for two weeks or longer. CAUSES   Research shows that there is no single cause for the disorder. Many factors act together to produce the illness.   This can be passed down from family (hereditary).   Environment may play a part.  TREATMENT   Long-term treatment is strongly recommended because bipolar disorder is a repeated illness. This disorder is better controlled if treatment is ongoing than if it is off and on.   A combination of medication and talk therapy is best for managing the disorder over time.   Medication.   Medication can be prescribed by a doctor that is an expert in treating mental disorders (psychiatrists). Medications known as "mood stabilizers" are usually prescribed to help control the illness. Other medications can be added when needed. These medicines usually treat episodes of mania or depression that break through despite the mood stabilizer.   Talk Therapy.   Along with medication, some forms of talk therapy are helpful in providing support, education and  guidance to people with the illness and their families. Studies show that this type of treatment increases mood stability, decreases need for hospitalization and improves how they function society.   Electroconvulsive Therapy (ECT).   In extreme situations where the above treatments do not work or work too slowly to relieve severe symptoms, ECT may be considered.  Document Released: 06/16/2000 Document Revised: 02/27/2011 Document Reviewed: 02/05/2007 [facility] Patient Information 2012 [place], [place].

## 2011-08-20 NOTE — BH Assessment (Signed)
Assessment Note   [name] is an 51 y.o. female that presented at the request of her PCP to address ongoing and worsening depressive symptoms.  Pt is currently being treated with Lithium and Prozac for her depression, but she is concerned that it is not working appropriately.  Pt is further concerned that she may be entering Menopause and that, additionally, may be causing her increased crying spells, irritability, isolating, and a decreased level of functioning.  Pt voices passive SI/HI at times (none currently) and feels that she is becoming increasingly anxious a/w problems at her job.  Pt does have a past inpatient psych history at [facility] and [facility] in 1999 and 2012 respectively, but denies current need for hospitalization.  Pt is followed by [facility] and [facility] [employer] [facility] for her depression and has recently been granted disability for same.  Pt is currently able to contract for safety, but remains tearful and worried about her current symptoms.  Axis I: Bipolar, mixed and Post Traumatic Stress Disorder Axis II: Deferred Axis III:  Past Medical History  Diagnosis Date  . Depression   . Anxiety   . Fibroid   . Chlamydia   . Trichomonas   . IBS (irritable bowel syndrome)   . Arthritis    Axis IV: occupational problems, other psychosocial or environmental problems, problems related to social environment and problems with primary support group Axis V: 31-40 impairment in reality testing  Past Medical History:  Past Medical History  Diagnosis Date  . Depression   . Anxiety   . Fibroid   . Chlamydia   . Trichomonas   . IBS (irritable bowel syndrome)   . Arthritis     Past Surgical History  Procedure Date  . No past surgeries surgery for abnormal vaginal bleeding  . Labial cyst removal     Family History:  Family History  Problem Relation Age of Onset  . Diabetes Father   . Diabetes Mother   . Hypertension Mother   . Deep vein thrombosis Mother     Social  History:  reports that she has never smoked. She does not have any smokeless tobacco history on file. She reports that she does not drink alcohol or use illicit drugs.  Additional Social History:  Alcohol / Drug Use Pain Medications: No Prescriptions: Yes Over the Counter: No History of alcohol / drug use?: No history of alcohol / drug abuse  CIWA: CIWA-Ar BP: 124/75 mmHg Pulse Rate: 105  COWS:    Allergies: No Known Allergies  Home Medications:  (Not in a hospital admission)  OB/GYN Status:  Patient's last menstrual period was 07/26/2011.  General Assessment Data Location of Assessment: [facility] ED Living Arrangements: Children Can pt return to current living arrangement?: Yes Admission Status: Voluntary Is patient capable of signing voluntary admission?: Yes Transfer from: Acute Hospital Referral Source: MD  Education Status Is patient currently in school?: No  Risk to self Suicidal Ideation: Yes-Currently Present Suicidal Intent: No-Not Currently/Within Last 6 Months Is patient at risk for suicide?: No Suicidal Plan?: No-Not Currently/Within Last 6 Months Access to Means: Yes Specify Access to Suicidal Means: pills and knives available What has been your use of drugs/alcohol within the last 12 months?: none noted Previous Attempts/Gestures: Yes How many times?: 2  Other Self Harm Risks: impulsive Triggers for Past Attempts: Other personal contacts;Hallucinations;Unpredictable Intentional Self Injurious Behavior: None Family Suicide History: No Recent stressful life event(s): Conflict (Comment);Turmoil (Comment) Persecutory voices/beliefs?: Yes Depression: Yes Depression Symptoms: Despondent;Insomnia;Tearfulness;Isolating;Fatigue;Guilt;Loss  of interest in usual pleasures;Feeling worthless/self pity;Feeling angry/irritable Substance abuse history and/or treatment for substance abuse?: No Suicide prevention information given to non-admitted patients: Not  applicable  Risk to Others Homicidal Ideation: No Thoughts of Harm to Others: No Current Homicidal Intent: No Current Homicidal Plan: No Access to Homicidal Means: No Identified Victim: none History of harm to others?: No Assessment of Violence: None Noted Violent Behavior Description: none noted Does patient have access to weapons?: No Criminal Charges Pending?: No Does patient have a court date: No  Psychosis Hallucinations: Auditory Delusions: Persecutory  Mental Status Report Appear/Hygiene: Improved Eye Contact: Good Motor Activity: Unremarkable Speech: Soft Level of Consciousness: Alert Mood: Depressed;Anxious;Ambivalent;Apathetic;Apprehensive;Despair;Empty;Helpless;Irritable;Preoccupied;Sad Affect: Anxious;Apathetic;Depressed;Inconsistent with thought content;Sad Anxiety Level: Moderate Thought Processes: Relevant Judgement: Impaired Orientation: Person;Place;Time;Situation Obsessive Compulsive Thoughts/Behaviors: Severe  Cognitive Functioning Concentration: Decreased Memory: Recent Impaired;Remote Impaired IQ: Average Insight: Fair Impulse Control: Poor Appetite: Poor Weight Loss: 2  Weight Gain: 0  Sleep: Increased Total Hours of Sleep: 16  Vegetative Symptoms: Staying in bed  ADLScreening [facility] Assessment Services) Patient's cognitive ability adequate to safely complete daily activities?: Yes Patient able to express need for assistance with ADLs?: Yes Independently performs ADLs?: Yes  Abuse/Neglect [facility]) Physical Abuse: Denies Verbal Abuse: Denies Sexual Abuse: Denies  Prior Inpatient Therapy Prior Inpatient Therapy: Yes Prior Therapy Dates: 1999; 2012 Prior Therapy Facilty/Provider(s): [facility] Reason for Treatment: depression and SI/ mania  Prior Outpatient Therapy Prior Outpatient Therapy: Yes Prior Therapy Dates: Currently Prior Therapy Facilty/Provider(s): [facility] Reason for Treatment: depression/BiPolar  ADL Screening (condition  at time of admission) Patient's cognitive ability adequate to safely complete daily activities?: Yes Patient able to express need for assistance with ADLs?: Yes Independently performs ADLs?: Yes       Abuse/Neglect Assessment (Assessment to be complete while patient is alone) Physical Abuse: Denies Verbal Abuse: Denies Sexual Abuse: Denies Exploitation of patient/patient's resources: Denies Self-Neglect: Denies Values / Beliefs Cultural Requests During Hospitalization: None Spiritual Requests During Hospitalization: None   Advance Directives (For Healthcare) Advance Directive: Patient does not have advance directive    Additional Information 1:1 In Past 12 Months?: No CIRT Risk: No Elopement Risk: No Does patient have medical clearance?: Yes     Disposition: Telepsych consult recommended for medication evaluation and further recommendations.  Disposition Disposition of Patient: Other dispositions Other disposition(s): To current provider;Other (Comment)  On Site Evaluation by:   Reviewed with Physician:     Angelica Ran 08/20/2011 4:08 PM

## 2011-08-20 NOTE — ED Notes (Signed)
Pt reports being irritable, having crying spells and depression since last weekend. Pt reports symptoms began usually 2 weeks before her menstrual cycle. Pt currently on different medications for depression but no relief. Pt does not have plan to harm herself but when she gets her crying spells she feels like she "does not want to be here anymore." When she gets irritable she "feels like she punching someone."

## 2011-08-20 NOTE — ED Notes (Signed)
Pt d/c home in NAD. Pt voiced understanding of d/c instructions and follow up care. Pt instructed on using tylenol and ibuprofen for menstrual cramping management.

## 2011-09-16 ENCOUNTER — Other Ambulatory Visit: Payer: Self-pay | Admitting: Internal Medicine

## 2011-10-10 ENCOUNTER — Encounter: Payer: Self-pay | Admitting: Gastroenterology

## 2011-11-27 ENCOUNTER — Encounter: Payer: Self-pay | Admitting: Gastroenterology

## 2011-12-10 ENCOUNTER — Encounter: Payer: Self-pay | Admitting: Gastroenterology

## 2011-12-11 ENCOUNTER — Encounter: Payer: Self-pay | Admitting: Gastroenterology

## 2012-01-08 ENCOUNTER — Encounter: Payer: Self-pay | Admitting: Gastroenterology

## 2012-01-08 ENCOUNTER — Ambulatory Visit (AMBULATORY_SURGERY_CENTER): Payer: Medicare Other | Admitting: *Deleted

## 2012-01-08 VITALS — Ht 67.5 in | Wt 214.7 lb

## 2012-01-08 DIAGNOSIS — Z800 Family history of malignant neoplasm of digestive organs: Secondary | ICD-10-CM

## 2012-01-08 DIAGNOSIS — Z1211 Encounter for screening for malignant neoplasm of colon: Secondary | ICD-10-CM

## 2012-01-08 MED ORDER — MOVIPREP 100 G PO SOLR: 1.0000 | Freq: Once | ORAL | Status: DC

## 2012-01-08 NOTE — Progress Notes (Signed)
Pt states she had ct scan last year that showed colon polyps but nothing removed. Ct scan stated diverticulosis but nothing stated about polyps ewm

## 2012-01-23 DIAGNOSIS — D126 Benign neoplasm of colon, unspecified: Secondary | ICD-10-CM

## 2012-01-23 HISTORY — DX: Benign neoplasm of colon, unspecified: D12.6

## 2012-01-27 ENCOUNTER — Ambulatory Visit (AMBULATORY_SURGERY_CENTER): Payer: Medicare Other | Admitting: Gastroenterology

## 2012-01-27 ENCOUNTER — Encounter: Payer: Self-pay | Admitting: Gastroenterology

## 2012-01-27 VITALS — BP 132/75 | HR 74 | Temp 98.2°F | Resp 18 | Ht 67.0 in | Wt 214.0 lb

## 2012-01-27 DIAGNOSIS — D126 Benign neoplasm of colon, unspecified: Secondary | ICD-10-CM

## 2012-01-27 DIAGNOSIS — Z1211 Encounter for screening for malignant neoplasm of colon: Secondary | ICD-10-CM

## 2012-01-27 MED ORDER — SODIUM CHLORIDE 0.9 % IV SOLN: 500.0000 mL | INTRAVENOUS | Status: DC

## 2012-01-27 NOTE — Op Note (Signed)
[facility] [address] [place], [address]   COLONOSCOPY PROCEDURE REPORT  PATIENT: [name], [name]  MR#: [id number] BIRTHDATE: Nov 05, 1960 , 51  yrs. old GENDER: Female ENDOSCOPIST: Meryl Dare, MD, [facility] PROCEDURE DATE:  01/27/2012 PROCEDURE:   Colonoscopy with biopsy ASA CLASS:   Class II INDICATIONS:GM with colon cancer, average risk patient for colorectal cancer. MEDICATIONS: MAC sedation, administered by CRNA and propofol (Diprivan) 250mg  IV DESCRIPTION OF PROCEDURE:   After the risks benefits and alternatives of the procedure were thoroughly explained, informed consent was obtained.  A digital rectal exam revealed no abnormalities of the rectum.   The LB CF-[id number]  endoscope was introduced through the anus and advanced to the cecum, which was identified by both the appendix and ileocecal valve. No adverse events experienced.   The quality of the prep was good, using MoviPrep  The instrument was then slowly withdrawn as the colon was fully examined.  COLON FINDINGS: A sessile polyp measuring 4 mm in size was found in the ascending colon.  A polypectomy was performed with cold forceps.  The resection was complete and the polyp tissue was completely retrieved.   Two sessile polyps measuring 3-4 mm in size were found in the transverse colon.  A polypectomy was performed with cold forceps.  The resection was complete and the polyp tissue was completely retrieved.   A sessile polyp measuring 5 mm in size was found in the descending colon.  A polypectomy was performed with cold forceps.  The resection was complete and the polyp tissue was completely retrieved.   The colon was otherwise normal.  There was no diverticulosis, inflammation, polyps or cancers unless previously stated.  Retroflexed views revealed small internal hemorrhoids. The time to cecum=1 minutes 51 seconds.  Withdrawal time=8 minutes 12 seconds.  The scope was withdrawn and  the procedure completed. COMPLICATIONS: There were no complications.  ENDOSCOPIC IMPRESSION: 1.   Sessile polyp in the ascending colon; polypectomy was performed with cold forceps 2.   Two sessile polyps in the transverse colon; polypectomy was performed with cold forceps 3.   Sessile polyp in the descending colon; polypectomy was performed with cold forceps 4.   Small internal hemorrhoids  RECOMMENDATIONS: 1.  Await pathology results 2.  Repeat colonoscopy in 5 years if polyp adenomatous; otherwise 10 years  eSigned:  Meryl Dare, MD, [facility] 01/27/2012 2:42 PM

## 2012-01-27 NOTE — Progress Notes (Signed)
NAC noted-VSS-A&R-tolerated procedure well. Anesthesia uneventful

## 2012-01-27 NOTE — Progress Notes (Signed)
Patient did not experience any of the following events: a burn prior to discharge; a fall within the facility; wrong site/side/patient/procedure/implant event; or a hospital transfer or hospital admission upon discharge from the facility. (G8907) Patient did not have preoperative order for IV antibiotic SSI prophylaxis. (G8918)  

## 2012-01-27 NOTE — Patient Instructions (Addendum)
YOU HAD AN ENDOSCOPIC PROCEDURE TODAY AT THE [facility]: Refer to the procedure report that was given to you for any specific questions about what was found during the examination.  If the procedure report does not answer your questions, please call your gastroenterologist to clarify.  If you requested that your care partner not be given the details of your procedure findings, then the procedure report has been included in a sealed envelope for you to review at your convenience later.  YOU SHOULD EXPECT: Some feelings of bloating in the abdomen. Passage of more gas than usual.  Walking can help get rid of the air that was put into your GI tract during the procedure and reduce the bloating. If you had a lower endoscopy (such as a colonoscopy or flexible sigmoidoscopy) you may notice spotting of blood in your stool or on the toilet paper. If you underwent a bowel prep for your procedure, then you may not have a normal bowel movement for a few days.  DIET: Your first meal following the procedure should be a light meal and then it is ok to progress to your normal diet.  A half-sandwich or bowl of soup is an example of a good first meal.  Heavy or fried foods are harder to digest and may make you feel nauseous or bloated.  Likewise meals heavy in dairy and vegetables can cause extra gas to form and this can also increase the bloating.  Drink plenty of fluids but you should avoid alcoholic beverages for 24 hours.  ACTIVITY: Your care partner should take you home directly after the procedure.  You should plan to take it easy, moving slowly for the rest of the day.  You can resume normal activity the day after the procedure however you should NOT DRIVE or use heavy machinery for 24 hours (because of the sedation medicines used during the test).    SYMPTOMS TO REPORT IMMEDIATELY: A gastroenterologist can be reached at any hour.  During normal business hours, 8:30 AM to 5:00 PM Monday through Friday,  call [phone].  After hours and on weekends, please call the GI answering service at [phone] who will take a message and have the physician on call contact you.   Following lower endoscopy (colonoscopy or flexible sigmoidoscopy):  Excessive amounts of blood in the stool  Significant tenderness or worsening of abdominal pains  Swelling of the abdomen that is new, acute  Fever of 100F or higher  Following upper endoscopy (EGD)  Vomiting of blood or coffee ground material  New chest pain or pain under the shoulder blades  Painful or persistently difficult swallowing  New shortness of breath  Fever of 100F or higher  Black, tarry-looking stools  FOLLOW UP: If any biopsies were taken you will be contacted by phone or by letter within the next 1-3 weeks.  Call your gastroenterologist if you have not heard about the biopsies in 3 weeks.  Our staff will call the home number listed on your records the next business day following your procedure to check on you and address any questions or concerns that you may have at that time regarding the information given to you following your procedure. This is a courtesy call and so if there is no answer at the home number and we have not heard from you through the emergency physician on call, we will assume that you have returned to your regular daily activities without incident.  SIGNATURES/CONFIDENTIALITY: You and/or your care   partner have signed paperwork which will be entered into your electronic medical record.  These signatures attest to the fact that that the information above on your After Visit Summary has been reviewed and is understood.  Full responsibility of the confidentiality of this discharge information lies with you and/or your care-partner.  

## 2012-01-28 ENCOUNTER — Telehealth: Payer: Self-pay | Admitting: *Deleted

## 2012-01-28 NOTE — Telephone Encounter (Signed)
  Follow up Call-  Call back number 01/27/2012  Post procedure Call Back phone  # [phone]  Permission to leave phone message Yes     Patient questions:  Do you have a fever, pain , or abdominal swelling? no Pain Score  0 *  Have you tolerated food without any problems? no  Have you been able to return to your normal activities? yes  Do you have any questions about your discharge instructions: Diet   no Medications  no Follow up visit  no  Do you have questions or concerns about your Care? no  Actions: * If pain score is 4 or above: No action needed, pain <4.

## 2012-02-02 ENCOUNTER — Encounter: Payer: Self-pay | Admitting: Gastroenterology

## 2012-05-28 ENCOUNTER — Ambulatory Visit: Payer: Medicare Other | Admitting: Cardiology

## 2012-05-31 ENCOUNTER — Encounter: Payer: Self-pay | Admitting: Cardiology

## 2012-05-31 ENCOUNTER — Ambulatory Visit (INDEPENDENT_AMBULATORY_CARE_PROVIDER_SITE_OTHER): Payer: Medicare Other | Admitting: Cardiology

## 2012-05-31 VITALS — BP 134/78 | HR 93 | Ht 66.5 in | Wt 227.6 lb

## 2012-05-31 DIAGNOSIS — E785 Hyperlipidemia, unspecified: Secondary | ICD-10-CM | POA: Insufficient documentation

## 2012-05-31 DIAGNOSIS — R0789 Other chest pain: Secondary | ICD-10-CM

## 2012-05-31 DIAGNOSIS — R079 Chest pain, unspecified: Secondary | ICD-10-CM

## 2012-05-31 NOTE — Patient Instructions (Addendum)
Your physician has requested that you have a lexiscan myoview. For further information please visit [email or web address]. Please follow instruction sheet, as given.  Your physician recommends that you continue on your current medications as directed. Please refer to the Current Medication list given to you today.  

## 2012-05-31 NOTE — Progress Notes (Signed)
[name] Date of Birth:  May 30, 1960 [facility] [address] [place], [place]  [address] [phone]         Fax   [phone]  History of Present Illness: This pleasant 52 year old [place] American woman is seen for the first time today.  She is seen at the request of Dr. Iran Ouch at family medicine at [facility].  The patient is seen because of concern over an abnormal electrocardiogram and the patient has been experiencing exertional chest tightness and shortness of breath.  The discomfort will sometimes radiate up to the left jaw and down the left arm.  The patient does not have any history of known heart problems.  She is not diabetic and she denies any history of high blood pressure.  However she is a former smoker and is exposed to secondhand smoke and she also has been told that her bad cholesterol runs high.  The patient does not get much aerobic exercise and her weight has been increasing.  She attributes some of the weight gain to her psychiatric drugs.  She is on disability because of bipolar illness and she is followed by psychiatry.  She is on lithium as well as antidepressants.  She was seen last week at family medicine at [facility] at which time an electrocardiogram was done which was stated to be abnormal.  We do not have a copy of the EKG.  However we repeated an electrocardiogram here today which is abnormal and shows moderate voltage criteria for LVH and shows nonspecific T-wave flattening. Her family history was positive for congestive heart failure in her maternal grandmother. The patient has smoked in the past but is not a current smoker although she states that she is exposed to a lot of secondhand smoke from friends. The patient denies any alcohol intake The patient currently is on disability.  In the past she has been a part-time substitute school bus driver.  Current Outpatient Prescriptions  Medication Sig Dispense Refill  . albuterol  (PROVENTIL HFA;VENTOLIN HFA) 108 (90 BASE) MCG/ACT inhaler Inhale 2 puffs into the lungs every 6 (six) hours as needed. Pt states she doesn't presently have an inhaler, uses very rarely      . aspirin 81 MG tablet Take 81 mg by mouth daily.        . calcium-vitamin D (OSCAL WITH D) 500-200 MG-UNIT per tablet Take 1 tablet by mouth daily.      . clonazePAM (KLONOPIN) 0.5 MG tablet Take 0.5 mg by mouth 3 (three) times daily as needed. For anxiety.      Marland Kitchen FLUoxetine (PROZAC) 20 MG capsule Take 40 mg by mouth daily.      . Ibuprofen (MIDOL) 200 MG CAPS Take 2 capsules by mouth daily as needed. For pain      . IRON PO Take 1 tablet by mouth daily.      . Iron-Vitamins (GERITOL) LIQD Take 15 mLs by mouth daily. For energy      . lithium carbonate 300 MG capsule Take 900 mg by mouth.      . Melatonin 3 MG TABS Take 1 tablet by mouth 3 times/day as needed-between meals & bedtime. For insomnia.      . mirtazapine (REMERON) 15 MG tablet Take 15 mg by mouth at bedtime.      Marland Kitchen omeprazole (PRILOSEC) 20 MG capsule Take 20 mg by mouth daily.      . promethazine (PHENERGAN) 25 MG tablet Take 25 mg by mouth  every 6 (six) hours as needed.      . vitamin B-12 (CYANOCOBALAMIN) 500 MCG tablet Take 500 mcg by mouth daily.        Marland Kitchen zolpidem (AMBIEN) 5 MG tablet Take 5 mg by mouth at bedtime as needed. For sleep      . FLUoxetine (PROZAC) 20 MG tablet Take 2 tablets (40 mg total) by mouth daily.  60 tablet  3   No current facility-administered medications for this visit.    No Known Allergies  Patient Active Problem List  Diagnosis  . TRICHOMONAL VAGINITIS  . HYPERCHOLESTEROLEMIA, MILD  . ANEMIA, MILD  . BIPOLAR DISORDER UNSPECIFIED  . ANXIETY STATE NOS  . DISORDER, DEPRESSIVE NEC  . EUSTACHIAN TUBE DYSFUNCTION, BILATERAL  . SINUSITIS, ACUTE  . ALLERGIC RHINITIS  . GERD  . IRRITABLE BOWEL SYNDROME  . DISORDER, LIVER NOS  . VAGINAL PRURITUS  . KNEE PAIN, LEFT  . DIZZINESS  . INSOMNIA  .  LYMPHADENOPATHY    History  Smoking status  . Never Smoker   Smokeless tobacco  . Never Used    History  Alcohol Use  . Yes    Comment: occ, not weekly    Family History  Problem Relation Age of Onset  . Diabetes Father   . Diabetes Mother   . Hypertension Mother   . Deep vein thrombosis Mother   . Colon cancer Neg Hx   . Rectal cancer Neg Hx   . Stomach cancer Neg Hx   . Cancer - Other Mother     peritoneal cancer    Review of Systems: Constitutional: no fever chills diaphoresis or fatigue or change in weight.  Head and neck: no hearing loss, no epistaxis, no photophobia or visual disturbance. Respiratory: No cough,  or wheezing.  Positive for exertional dyspnea Cardiovascular: No peripheral edema, palpitations.  Positive for exertional chest discomfort Gastrointestinal: No abdominal distention, no abdominal pain, no change in bowel habits hematochezia or melena. Genitourinary: No dysuria, no frequency, no urgency, no nocturia. Musculoskeletal:No arthralgias, no back pain, no gait disturbance or myalgias.  Positive for painful arthritis of the legs knees and hips  Neurological: No dizziness, no headaches, no numbness, no seizures, no syncope, no weakness, no tremors. Hematologic: No lymphadenopathy, no easy bruising. Psychiatric: No confusion, no hallucinations, no sleep disturbance.  Positive for history of bipolar illness    Physical Exam: Filed Vitals:   05/31/12 1539  BP: 134/78  Pulse: 93   the general appearance reveals a large middle-aged woman in no distress.The head and neck exam reveals pupils equal and reactive.  Extraocular movements are full.  There is no scleral icterus.  The mouth and pharynx are normal.  The neck is supple.  The carotids reveal no bruits.  The jugular venous pressure is normal.  The  thyroid is not enlarged.  There is no lymphadenopathy.  The chest is clear to percussion and auscultation.  There are no rales or rhonchi.  Expansion of  the chest is symmetrical.  The precordium is quiet.  The first heart sound is normal.  The second heart sound is physiologically split.  There is no murmur gallop rub or click.  There is no abnormal lift or heave.  The abdomen is soft and nontender.  The bowel sounds are normal.  The liver and spleen are not enlarged.  There are no abdominal masses.  There are no abdominal bruits.  Extremities reveal good pedal pulses.  There is no phlebitis or edema.  There is no cyanosis or clubbing.  Strength is normal and symmetrical in all extremities.  There is no lateralizing weakness.  There are no sensory deficits.  The skin is warm and dry.  There is no rash.  EKG shows normal sinus rhythm, voltage for LVH, nonspecific T-wave flattening.    Assessment / Plan: The patient has risk factors for ischemic heart disease including elevated cholesterol and exposure to tobacco smoke.  She is having symptoms of exertional chest tightness with left arm radiation as well as exertional dyspnea.  Her EKG is abnormal. Plan: We will have her return for a 2 day Lexa scan Myoview stress test.  She would not be able to walk well because of her arthritis of the knees and hips. No new medication prescribed but she will continue to take a daily aspirin.

## 2012-06-08 ENCOUNTER — Encounter (HOSPITAL_COMMUNITY): Payer: Medicare Other

## 2012-06-08 ENCOUNTER — Ambulatory Visit (HOSPITAL_COMMUNITY): Payer: Medicare Other | Attending: Cardiology | Admitting: Radiology

## 2012-06-08 VITALS — Ht 66.5 in | Wt 224.0 lb

## 2012-06-08 DIAGNOSIS — R079 Chest pain, unspecified: Secondary | ICD-10-CM

## 2012-06-08 DIAGNOSIS — R0602 Shortness of breath: Secondary | ICD-10-CM

## 2012-06-08 DIAGNOSIS — R0789 Other chest pain: Secondary | ICD-10-CM

## 2012-06-08 DIAGNOSIS — R55 Syncope and collapse: Secondary | ICD-10-CM | POA: Insufficient documentation

## 2012-06-08 DIAGNOSIS — R0989 Other specified symptoms and signs involving the circulatory and respiratory systems: Secondary | ICD-10-CM | POA: Insufficient documentation

## 2012-06-08 DIAGNOSIS — R0609 Other forms of dyspnea: Secondary | ICD-10-CM | POA: Insufficient documentation

## 2012-06-08 DIAGNOSIS — J45909 Unspecified asthma, uncomplicated: Secondary | ICD-10-CM | POA: Insufficient documentation

## 2012-06-08 MED ORDER — TECHNETIUM TC 99M SESTAMIBI GENERIC - CARDIOLITE
33.0000 | Freq: Once | INTRAVENOUS | Status: AC | PRN
  Administered 2012-06-08: 33 via INTRAVENOUS

## 2012-06-08 MED ORDER — REGADENOSON 0.4 MG/5ML IV SOLN
0.4000 mg | Freq: Once | INTRAVENOUS | Status: AC
  Administered 2012-06-08: 0.4 mg via INTRAVENOUS

## 2012-06-08 NOTE — Progress Notes (Signed)
[facility] SITE 3 NUCLEAR MED [address] [place], [place] [address] [phone]    Cardiology Nuclear Med Study  [name] is a 52 y.o. female     MRN : [id number]     DOB: 03/04/1961  Procedure Date: 06/08/2012  Nuclear Med Background Indication for Stress Test:  Evaluation for Ischemia and Abnormal EKG History:  No known prior history of CAD, Asthma, '12 Stress Echo: NL, EF=55% Cardiac Risk Factors: Lipids  Symptoms:Chest Tightness with/without exertion that radiated to (L) jaw and arm (last occurrence last week),   Diaphoresis, DOE, Fatigue, Fatigue with Exertion, Light-Headedness, Nausea, Near Syncope, SOB and Vomiting   Nuclear Pre-Procedure Caffeine/Decaff Intake:  None > 12 hrs NPO After: 11:00am   Lungs:  Clear, Proventil Inhaler 2 sprays prior to Lexiscan, O2 Sat: 95-97% on room air. IV 0.9% NS with Angio Cath:  22g  IV Site: R Hand x 1, tolerated well IV Started by:  Irean Hong, RN  Chest Size (in):  38 Cup Size: D  Height: 5' 6.5" (1.689 m)  Weight:  224 lb (101.606 kg)  BMI:  Body mass index is 35.62 kg/(m^2). Tech Comments:  No medication today    Nuclear Med Study 1 or 2 day study: 2 day  Stress Test Type:  Treadmill/Lexiscan  Reading MD: Olga Millers, MD  Order Authorizing Provider:  Cassell Clement, MD  Resting Radionuclide: Technetium 3m Sestamibi  Resting Radionuclide Dose: 33.0 mCi on 06/10/12   Stress Radionuclide:  Technetium 38m Sestamibi  Stress Radionuclide Dose: 33.0 mCi on 06/08/12           Stress Protocol Rest HR: 93 Stress HR: 127  Rest BP: 112/80 Stress BP: 129/69  Exercise Time (min): 2:00 METS: n/a   Predicted Max HR: 169 bpm % Max HR: 75.15 bpm Rate Pressure Product: 46962   Dose of Adenosine (mg):  n/a Dose of Lexiscan: 0.4 mg  Dose of Atropine (mg): n/a Dose of Dobutamine: n/a mcg/kg/min (at max HR)  Stress Test Technologist: Irean Hong, RN  Nuclear Technologist:  Domenic Polite, CNMT     Rest  Procedure:  Myocardial perfusion imaging was performed at rest 45 minutes following the intravenous administration of Technetium 5m Sestamibi. Rest ECG: NSR with nonspecific ST changes.  Stress Procedure:  The patient received IV Lexiscan 0.4 mg over 15-seconds with concurrent low level exercise and then Technetium 22m Sestamibi was injected at 30-seconds while the patient continued walking one more minute. The patient complained of SOB, lightheadedness, and denied chest pain. Quantitative spect images were obtained after a 45-minute delay. Stress ECG: No significant ST segment change suggestive of ischemia.  QPS Raw Data Images:  Acquisition technically good; normal left ventricular size. Stress Images:  Normal homogeneous uptake in all areas of the myocardium. Rest Images:  Normal homogeneous uptake in all areas of the myocardium. Subtraction (SDS):  No evidence of ischemia. Transient Ischemic Dilatation (Normal <1.22):  1.08 Lung/Heart Ratio (Normal <0.45):  0.47  Quantitative Gated Spect Images QGS EDV:  72 ml QGS ESV:  21 ml  Impression Exercise Capacity:  Lexiscan with low level exercise. BP Response:  Normal blood pressure response. Clinical Symptoms:  There is dyspnea. ECG Impression:  No significant ST segment change suggestive of ischemia. Comparison with Prior Nuclear Study: No previous nuclear study performed  Overall Impression:  Normal stress nuclear study.  LV Ejection Fraction: 72%.  LV Wall Motion:  NL LV Function; NL Wall Motion  Olga Millers

## 2012-06-09 ENCOUNTER — Telehealth: Payer: Self-pay | Admitting: Genetic Counselor

## 2012-06-09 NOTE — Telephone Encounter (Signed)
S/W PT IN RE TO APPT CHANGE TO 06/09 @ 10. NEW CALENDAR MAILED.

## 2012-06-10 ENCOUNTER — Encounter: Payer: Medicare Other | Admitting: Genetic Counselor

## 2012-06-10 ENCOUNTER — Other Ambulatory Visit: Payer: Medicare Other | Admitting: Lab

## 2012-06-10 ENCOUNTER — Ambulatory Visit (HOSPITAL_COMMUNITY): Payer: Medicare Other | Admitting: Radiology

## 2012-06-10 DIAGNOSIS — R0989 Other specified symptoms and signs involving the circulatory and respiratory systems: Secondary | ICD-10-CM

## 2012-06-10 MED ORDER — TECHNETIUM TC 99M SESTAMIBI GENERIC - CARDIOLITE
33.0000 | Freq: Once | INTRAVENOUS | Status: AC | PRN
  Administered 2012-06-10: 33 via INTRAVENOUS

## 2012-06-17 ENCOUNTER — Telehealth: Payer: Self-pay | Admitting: Cardiology

## 2012-06-17 NOTE — Telephone Encounter (Signed)
Tawana from PCP's office requesting copy of stress test due to referral of patient to [name].  Stress report routed to PCP and copy faxed to [facility] per request.

## 2012-06-17 NOTE — Telephone Encounter (Signed)
New Prob   Calling in to follow up on stress test results from a few weeks ago.   Fax to: [phone] attn: Kathie Rhodes

## 2012-06-24 ENCOUNTER — Other Ambulatory Visit (HOSPITAL_COMMUNITY): Payer: Self-pay | Admitting: Family Medicine

## 2012-06-24 DIAGNOSIS — Z1231 Encounter for screening mammogram for malignant neoplasm of breast: Secondary | ICD-10-CM

## 2012-06-30 ENCOUNTER — Ambulatory Visit (HOSPITAL_COMMUNITY)
Admission: RE | Admit: 2012-06-30 | Discharge: 2012-06-30 | Disposition: A | Discharge status: Home / Self Care | Payer: Medicare Other | Source: Ambulatory Visit | Attending: Family Medicine | Admitting: Family Medicine

## 2012-06-30 DIAGNOSIS — Z1231 Encounter for screening mammogram for malignant neoplasm of breast: Secondary | ICD-10-CM | POA: Insufficient documentation

## 2012-07-01 ENCOUNTER — Other Ambulatory Visit (INDEPENDENT_AMBULATORY_CARE_PROVIDER_SITE_OTHER): Payer: Self-pay | Admitting: Surgery

## 2012-07-01 ENCOUNTER — Other Ambulatory Visit: Payer: Self-pay | Admitting: Family Medicine

## 2012-07-01 DIAGNOSIS — R928 Other abnormal and inconclusive findings on diagnostic imaging of breast: Secondary | ICD-10-CM

## 2012-07-01 DIAGNOSIS — Z1231 Encounter for screening mammogram for malignant neoplasm of breast: Secondary | ICD-10-CM

## 2012-07-09 ENCOUNTER — Other Ambulatory Visit: Payer: Medicare Other

## 2012-07-22 ENCOUNTER — Ambulatory Visit
Admission: RE | Admit: 2012-07-22 | Discharge: 2012-07-22 | Disposition: A | Discharge status: Home / Self Care | Payer: Medicare Other | Source: Ambulatory Visit | Attending: Family Medicine | Admitting: Family Medicine

## 2012-07-22 DIAGNOSIS — R928 Other abnormal and inconclusive findings on diagnostic imaging of breast: Secondary | ICD-10-CM

## 2012-08-30 ENCOUNTER — Other Ambulatory Visit: Payer: Medicare Other | Admitting: Lab

## 2012-08-30 ENCOUNTER — Encounter: Payer: Medicare Other | Admitting: Genetic Counselor

## 2012-10-28 ENCOUNTER — Telehealth: Payer: Self-pay | Admitting: Cardiology

## 2012-10-28 DIAGNOSIS — R5383 Other fatigue: Secondary | ICD-10-CM

## 2012-10-28 DIAGNOSIS — G4700 Insomnia, unspecified: Secondary | ICD-10-CM

## 2012-10-28 NOTE — Telephone Encounter (Signed)
Pt returns call. Wanted to let Dr. Patty Sermons know that pcp would like him to do the referral for the sleep study  I will forward this to Dr. Patty Sermons for review  Mylo Red RN

## 2012-10-28 NOTE — Telephone Encounter (Signed)
New Prob  Pt states that at her last visit they discussed possibly doing a sleep study. She wants to know if this can be scheduled.

## 2012-10-28 NOTE — Telephone Encounter (Signed)
Yes okay to refer to [facility] for a sleep study.

## 2012-10-28 NOTE — Telephone Encounter (Signed)
Left pt a message to call back. 

## 2012-10-29 NOTE — Telephone Encounter (Signed)
No answer at home. No voicemail set up. I have placed order & will forward to Dr. Yevonne Pax nurse  Mylo Red RN

## 2012-10-29 NOTE — Telephone Encounter (Signed)
I advised patient that referral has been sent and that she should expect a call from pulmonary to schedule the study.  Patient verbalized understanding.

## 2012-12-03 ENCOUNTER — Ambulatory Visit (HOSPITAL_BASED_OUTPATIENT_CLINIC_OR_DEPARTMENT_OTHER): Payer: Medicare Other | Attending: Cardiology

## 2012-12-03 VITALS — Ht 67.0 in | Wt 228.0 lb

## 2012-12-03 DIAGNOSIS — G4700 Insomnia, unspecified: Secondary | ICD-10-CM

## 2012-12-03 DIAGNOSIS — G4733 Obstructive sleep apnea (adult) (pediatric): Secondary | ICD-10-CM

## 2012-12-03 DIAGNOSIS — R5383 Other fatigue: Secondary | ICD-10-CM

## 2012-12-03 DIAGNOSIS — G4710 Hypersomnia, unspecified: Secondary | ICD-10-CM | POA: Insufficient documentation

## 2012-12-03 DIAGNOSIS — R259 Unspecified abnormal involuntary movements: Secondary | ICD-10-CM | POA: Insufficient documentation

## 2012-12-06 ENCOUNTER — Other Ambulatory Visit (HOSPITAL_COMMUNITY): Payer: Self-pay | Admitting: Nurse Practitioner

## 2012-12-06 ENCOUNTER — Other Ambulatory Visit (HOSPITAL_COMMUNITY): Payer: Self-pay | Admitting: Internal Medicine

## 2012-12-06 DIAGNOSIS — D219 Benign neoplasm of connective and other soft tissue, unspecified: Secondary | ICD-10-CM

## 2012-12-09 ENCOUNTER — Ambulatory Visit (HOSPITAL_COMMUNITY)
Admission: RE | Admit: 2012-12-09 | Discharge: 2012-12-09 | Disposition: A | Discharge status: Home / Self Care | Payer: Medicare Other | Source: Ambulatory Visit | Attending: Nurse Practitioner | Admitting: Nurse Practitioner

## 2012-12-09 DIAGNOSIS — D251 Intramural leiomyoma of uterus: Secondary | ICD-10-CM | POA: Insufficient documentation

## 2012-12-09 DIAGNOSIS — N83209 Unspecified ovarian cyst, unspecified side: Secondary | ICD-10-CM | POA: Insufficient documentation

## 2012-12-09 DIAGNOSIS — G4730 Sleep apnea, unspecified: Secondary | ICD-10-CM

## 2012-12-09 DIAGNOSIS — D219 Benign neoplasm of connective and other soft tissue, unspecified: Secondary | ICD-10-CM

## 2012-12-10 NOTE — Procedures (Signed)
NAMEJANSEN, GOODPASTURE NO.:  [id number]  MEDICAL RECORD NO.:  [id number]          PATIENT TYPE:  OUT  LOCATION:  SLEEP CENTER                 FACILITY:  [facility]  PHYSICIAN:  Barbaraann Share, MD,FCCPDATE OF BIRTH:  08-16-1960  DATE OF STUDY:  12/03/2012                           NOCTURNAL POLYSOMNOGRAM  REFERRING PHYSICIAN:  Cassell Clement, M.D.  INDICATION FOR STUDY:  Hypersomnia with sleep apnea.  EPWORTH SLEEPINESS SCORE:  19.  MEDICATIONS:  SLEEP ARCHITECTURE:  The patient had a total sleep time of 383 minutes with no slow-wave sleep and only 39 minutes of REM.  Sleep onset latency was normal at 22 minutes, and REM onset was normal at 109 minutes. Sleep efficiency was normal at 93%.  RESPIRATORY DATA:  The patient was noted to have 3 central apneas as well as 10 obstructive hypopneas, giving her an apnea-hypopnea index of only 2 events per hour.  The events occurred primarily in the supine position, and there was moderate snoring noted throughout.  OXYGEN DATA:  There was O2 desaturation as low as 90% with the patient's obstructive events.  CARDIAC DATA:  Occasional PVC noted, but no clinically significant arrhythmias were seen.  MOVEMENT-PARASOMNIA:  The patient was found to have 223 leg movements with 4 per hour resulting in arousal or awakening.  There were no abnormal behaviors noted.  IMPRESSIONS-RECOMMENDATIONS: 1. Small numbers of obstructive events, which do not meet the AHI     criteria for the obstructive sleep apnea syndrome.  The patient     should be encouraged to work aggressively on weight loss, and can     try avoiding the supine position during sleep to help her snoring. 2. Occasional PVC noted, but no clinically significant arrhythmias     were seen. 3. Large numbers of periodic limb movements noted with what appears to     be significant sleep disruption.  This     may represent a primary movement disorder of sleep such as the    restless legs syndrome or periodic limb movement disorder.     Clinical correlation is suggested, as this can result in     significant sleep disruption.     Barbaraann Share, MD,FCCP Diplomate, American Board of Sleep Medicine    [facility]/MEDQ  D:  12/09/2012 14:58:37  T:  12/10/2012 03:50:39  Job:  [id number]

## 2013-01-27 ENCOUNTER — Other Ambulatory Visit: Payer: Self-pay

## 2013-03-29 ENCOUNTER — Telehealth: Payer: Self-pay | Admitting: Cardiology

## 2013-03-29 NOTE — Telephone Encounter (Signed)
No sleep apnea per  Dr. Mare Ferrari, advised patient

## 2013-03-29 NOTE — Telephone Encounter (Signed)
New message     Pt need results of sleep study done in sept 2014

## 2013-04-18 ENCOUNTER — Ambulatory Visit: Payer: Medicare Other | Admitting: Cardiology

## 2013-08-04 ENCOUNTER — Ambulatory Visit: Payer: Medicare Other | Attending: Internal Medicine | Admitting: *Deleted

## 2013-08-04 DIAGNOSIS — F331 Major depressive disorder, recurrent, moderate: Secondary | ICD-10-CM

## 2013-08-04 NOTE — Progress Notes (Signed)
LCSW met with patient who stated tat she has been dealing with depressive symptoms for nearly 20 years.  Patient stated that she has been in and out of treatment but is now seeking treatment with this LCSW at the recommendation of a acquaintance. LCSW began reviewing patient history and assigned patient to write the 'dream' in order to initiate ongoing treatment and care for managing depressive symptoms.  Patient also stated that she has her medication management done at [facility].  Christene Lye MSW, LCSW Duration 45 minutes

## 2013-08-11 ENCOUNTER — Ambulatory Visit: Payer: Medicare Other | Attending: Internal Medicine | Admitting: *Deleted

## 2013-08-11 DIAGNOSIS — F411 Generalized anxiety disorder: Secondary | ICD-10-CM

## 2013-08-11 NOTE — Progress Notes (Signed)
LCSW met with patient in order to work on depression and anxiety symptoms by identifying goals for next steps. Patient was open to processing and came prepared with how she desires to pursue her dreams.  Patient was clear that she had a desire to work with people in a problem solving capacity particularly for life needs.  LCSW worked with patient to develop a process that includes work and training in order to pursue this goal. Patient was engaged and identified that this process was very supportive and informative for her.  LCSW will follow in 1 week.   J  MSW, LCSW Duration 75 minutes 

## 2013-09-02 ENCOUNTER — Other Ambulatory Visit: Payer: Medicare Other

## 2013-09-05 ENCOUNTER — Other Ambulatory Visit: Payer: Medicare Other

## 2013-09-08 ENCOUNTER — Ambulatory Visit: Payer: Medicare Other | Attending: Internal Medicine | Admitting: *Deleted

## 2013-09-08 DIAGNOSIS — F331 Major depressive disorder, recurrent, moderate: Secondary | ICD-10-CM

## 2013-09-08 NOTE — Progress Notes (Signed)
LCSW met with patient in order to continue processing mood and emotions.  Patient stated that she has been concerned about her stressors with her daughter.  LCSW processed with patient the importance of following the process of launching her daughter by focusing on her daughter's successes.  LCSW encouraged the patient to accept this developmental process and increase her own self care. Patient identified a great deal of self awareness but still received from LCSW providing some support and direction with transition.  Next appointment in 1 week.  Christene Lye MSW, LCSW  Duration 60 minutes

## 2013-09-15 ENCOUNTER — Ambulatory Visit: Payer: Medicare Other | Attending: Internal Medicine | Admitting: *Deleted

## 2013-09-15 DIAGNOSIS — F3289 Other specified depressive episodes: Secondary | ICD-10-CM

## 2013-09-15 DIAGNOSIS — F329 Major depressive disorder, single episode, unspecified: Secondary | ICD-10-CM

## 2013-09-15 NOTE — Progress Notes (Signed)
LCSW met with patient in order to continue processing challenges with managing their mood and emotions.  LCSW assessed multiple areas of her life that she felt that she was dealing with major transitions and decisions.  LCSW assessed that patient has difficulty connecting with others in authentic relationship outside of serving others.  LCSW worked with patient to assess and evaluate these areas in order identify direction for the patient   LCSW will continue to follow with next appointment next week.  Christene Lye MSW, LCSW Duration 90 minutes

## 2013-09-22 ENCOUNTER — Ambulatory Visit: Payer: Medicare Other | Attending: Internal Medicine | Admitting: *Deleted

## 2013-09-22 DIAGNOSIS — F32A Depression, unspecified: Secondary | ICD-10-CM

## 2013-09-22 DIAGNOSIS — F329 Major depressive disorder, single episode, unspecified: Secondary | ICD-10-CM

## 2013-09-22 DIAGNOSIS — F3289 Other specified depressive episodes: Secondary | ICD-10-CM

## 2013-09-22 NOTE — Progress Notes (Signed)
LCSW met with patient in order to continue processing depressive symptoms.  Patient reviewed her previous week and level of frustration.  Patient identified that she has used her therapeutic tools in order to address frustrations that she has experienced over the past week.  LCSW celebrated these challenges in order to encourage patient to continue using her tools for ongoing success.  Patient identified that she continues to have depressive mood and crises that limit her ability to process and function but these events are decreasing.    Christene Lye MSW, LCSW Duration 60 minutes

## 2013-09-29 ENCOUNTER — Ambulatory Visit: Payer: Medicare Other | Attending: Internal Medicine | Admitting: *Deleted

## 2013-09-29 DIAGNOSIS — F331 Major depressive disorder, recurrent, moderate: Secondary | ICD-10-CM

## 2013-09-29 NOTE — Progress Notes (Signed)
LCSW met with patient who has been experiencing increasing symptoms of depression.  LCSW processed current challenges with depression and mood management.  Patient identified that she has not had many additional stressors lately but notices increased crying spells and increased days of low mood as well as difficulty in managing stressful situations.  LCSW supported patient in reframing some things about her self esteem and identify.  LCSW also encouraged patient to process this change in mood and emotions with psychiatrist and primary care physician.  LCSW is also requesting 2x weekly meetings in order to monitor mood.  Patient open to these changes for further support and treatment of depression.  Christene Lye MSW, LCSW Duration 75 minutes

## 2013-10-04 ENCOUNTER — Ambulatory Visit: Payer: Medicare Other | Attending: Internal Medicine | Admitting: *Deleted

## 2013-10-04 ENCOUNTER — Other Ambulatory Visit: Payer: Medicare Other

## 2013-10-04 DIAGNOSIS — F339 Major depressive disorder, recurrent, unspecified: Secondary | ICD-10-CM

## 2013-10-04 NOTE — Progress Notes (Signed)
LCSW met with patient in order to begin processing challenges with emotions and anxiety.  Patient's mood had noticeably decreased since last session.  Patient agreed.  LCSW made patient aware and encouraged patient to review with Psychiatrist to assess current medication.  LCSW began working on improving lethargy by reviewing challenges that prevent accomplishing tasks and by acknowledging current challenges.  LCSW informed patient that she should schedule in 1 week but could walk in if needed before then.  Christene Lye MSW, LCSW Duration 75 minutes

## 2013-10-11 ENCOUNTER — Ambulatory Visit: Payer: Medicare Other | Attending: Internal Medicine | Admitting: *Deleted

## 2013-10-11 DIAGNOSIS — F339 Major depressive disorder, recurrent, unspecified: Secondary | ICD-10-CM

## 2013-10-11 NOTE — Progress Notes (Signed)
LCSW met with patient in order to continue proceeing challenges with mood and emotions in order for ongoing management.  Patient stated that she was doing well and had opportunity to gain support from some other resources that was very helpful for her.  LCSW processed series of growth and progress through patient and outside examples .  Patient was open to this processing and identified that it was helpful for her.  LCSW will follow in 1 week.  Christene Lye MSW, LCSW Duration 60 minutes

## 2013-10-18 ENCOUNTER — Ambulatory Visit: Payer: Medicare Other | Attending: Internal Medicine | Admitting: *Deleted

## 2013-10-18 DIAGNOSIS — F339 Major depressive disorder, recurrent, unspecified: Secondary | ICD-10-CM

## 2013-10-18 NOTE — Progress Notes (Signed)
LCSW met with patient in order to process challenges with mood and emotions.  Patient stated that she had several challenges this week and has managed to maintain her focus.  LCSW encouraged progress and addressed the symptoms of depression that are persistant.  Patient continues to deal with depressed mood, anxiety and social withdrawal.  LCSW processed these challenges by helping patient shift her focus from the current challenges to reassessing her needs and resources.  Patient will follow in 1 week.  Christene Lye MSW, LCSW Duration 60 minutes

## 2013-10-24 ENCOUNTER — Other Ambulatory Visit: Payer: Medicare Other

## 2013-10-26 ENCOUNTER — Other Ambulatory Visit: Payer: Medicare Other

## 2013-10-27 ENCOUNTER — Ambulatory Visit: Payer: Medicare Other | Attending: Internal Medicine | Admitting: *Deleted

## 2013-10-27 DIAGNOSIS — F339 Major depressive disorder, recurrent, unspecified: Secondary | ICD-10-CM

## 2013-10-28 NOTE — Progress Notes (Signed)
LCSW met with patient in order to continue processing challenges with emotions. Patient identified that she has been making strides in setting boundaries with others in order to improve her self care.  Patient also identified specific progress and successes that she has made recently.  LCSW reviewed those successes in order to build and develop personal esteem and identify strengths that can be used in other areas of her life.  LCSW encouraged patient to use these strengths to review other goals that she identified.  Christene Lye MSW, LCSW Duration 60 minutes

## 2013-11-01 ENCOUNTER — Ambulatory Visit: Payer: Medicare Other | Attending: Internal Medicine | Admitting: *Deleted

## 2013-11-01 DIAGNOSIS — F331 Major depressive disorder, recurrent, moderate: Secondary | ICD-10-CM

## 2013-11-01 NOTE — Progress Notes (Signed)
LCSW met with patient in order to provide additional support and direction.  LCSW encouraged patient to begin each day with "Celebration Meditations" These are affirmations to provide encouragement for the day.  LCSW also reviewed goals and plans for goals as well as supported improved self care.  Patient identified that her mood had been decreasing over the weekend and LCSW processed challenges and medication adherence in order to support patient improving her mood.  LCSW also encouraged patient to move forward with goals and begin making plans for the year and not 1 month at a time.  LCSW will continue to follow.  Christene Lye MSW, LCSW Duration 75 minutes

## 2013-11-10 ENCOUNTER — Encounter: Payer: Self-pay | Admitting: Gastroenterology

## 2013-11-10 ENCOUNTER — Other Ambulatory Visit: Payer: Medicare Other

## 2013-11-16 ENCOUNTER — Other Ambulatory Visit: Payer: Medicare Other

## 2013-11-18 ENCOUNTER — Other Ambulatory Visit: Payer: Medicare Other

## 2013-12-02 ENCOUNTER — Ambulatory Visit: Payer: Medicare Other | Attending: Internal Medicine | Admitting: *Deleted

## 2013-12-02 DIAGNOSIS — F339 Major depressive disorder, recurrent, unspecified: Secondary | ICD-10-CM

## 2013-12-02 NOTE — Progress Notes (Signed)
LCSW met with patient after several weeks. Patient shared much of the challenges that she had experienced during this time.  LCSW use this opportunity to show patient how she had been using the tools she gained in treatment (boundary setting, self care, locus of control, etc). LCSW was able to celebrate patient's progress and encourage further growth.  LCSW continuing work with patient to build goals, planning, direction and pursue plans.  Patient continues to identify the lack of direction challenging her mood and emotions.  Patient will return in 1 week for follow up.  Christene Lye MSW, LCSW Duration 75 minutes

## 2013-12-12 ENCOUNTER — Ambulatory Visit: Payer: Medicare Other | Attending: Internal Medicine | Admitting: *Deleted

## 2013-12-12 DIAGNOSIS — F339 Major depressive disorder, recurrent, unspecified: Secondary | ICD-10-CM

## 2013-12-12 NOTE — Progress Notes (Signed)
LCSW met with patient in order to continue working on developing to to improve mood.  LCSW processed current challenges with managing mood with unsympathetic family.  LCSW processed these challenges in the context of faith and provided cbt modeled interventions to support patient mood and boundary setting.  Patient stated that she continues to have crying spells.  When therapist identified trend of crying spells patient stated that she stopped taking her remeron because of weight gain.  LCSW encouraged patient to resume medication and address these issues with psychiatrist at next appt in about 2 weeks.  LCSW also worked with patient in identifying self purpose, which was difficult for patient due to low self esteem.  Patient will return in 1 week for ongoing care.  [name] MSW, LCSW Duration 90 minutes

## 2013-12-19 ENCOUNTER — Ambulatory Visit: Payer: Medicare Other | Attending: Family Medicine | Admitting: *Deleted

## 2013-12-19 ENCOUNTER — Other Ambulatory Visit: Payer: Medicare Other

## 2013-12-19 DIAGNOSIS — F339 Major depressive disorder, recurrent, unspecified: Secondary | ICD-10-CM

## 2013-12-19 NOTE — Progress Notes (Signed)
Patient presented to LCSW in lower than normal mood.  Patient stated that she had been feeling very distressed lately and felt like her mood was declining and needed some additional support and treatment.  LCSW encouraged patient to consult with her psychiatrist about her medication after she shared in the last session that she had stopped taking it.  LCSW also encouraged patient to reframe her perspective on her supports, her relationship with her daughter, her current job situation, her daughter's situation and her self. Patient had difficulty acknowledging the positive things in her life and was practicing tunnel vision so LCSW had to remind patient of positive experiences and supports around her. Patient will return within 2 days for follow up care.  Christene Lye MSW,LCSW 105 minutes

## 2013-12-21 ENCOUNTER — Other Ambulatory Visit: Payer: Medicare Other

## 2013-12-23 ENCOUNTER — Ambulatory Visit: Payer: Medicare Other | Attending: Family Medicine | Admitting: *Deleted

## 2013-12-23 DIAGNOSIS — F339 Major depressive disorder, recurrent, unspecified: Secondary | ICD-10-CM

## 2013-12-23 NOTE — Progress Notes (Signed)
Patient presented and identified she was still experiencing low mood, crying spells, fatigue, and guilt.  Patient also identified ongoing mood swings and frustration with lack of gaol accomplishment. Patient expressed a lot of concern abou there daughter's recent move to [place].  LCSW observed patient crying spells and expression of guilt in relationship to her daughter's recent move. Patient does not take into account all of the areas that she has been helpful to her daughter but instead blames herself for the things going wrong in her daughter's life including her daughter's active choices. Patient continues to deal with a lot of guilt and frustration. Patient crying spells is likely connected to lack of consistency with antidepressant medications.  LCSW recommended that patient contact her psychiatrist in order to review medications.  Crying spells are contributing to low mood and crying spells.  L:CSW provided education about appropriate medication as well as reframing of poor self perception to improve mood.  LCSW also reconceptualized goal planning in order to increase opportunities for success.  Plan is for patient to return in 1 week. However, patient is expected to contact psychiatrist, develop goal plans for self and career and practice self care.  Christene Lye MSW, LCSW Duration 60 minutes

## 2014-01-02 ENCOUNTER — Other Ambulatory Visit: Payer: Medicare Other

## 2014-01-02 ENCOUNTER — Ambulatory Visit: Payer: Medicare Other | Attending: Internal Medicine | Admitting: *Deleted

## 2014-01-02 DIAGNOSIS — F339 Major depressive disorder, recurrent, unspecified: Secondary | ICD-10-CM

## 2014-01-02 NOTE — Progress Notes (Signed)
Patient arrived in crisis. Patient had no precipitating factors but was experiencing increased depressive symptoms.  LCSW assessed patient's current mood and patient had high scores on depression screening. LCSW processed depressive mood and anxiety that patient contributed to conflicts with daughter as well as other tensions.  LCSW processed life transitions and reframed emotional processing errors to improve mood. This process was helpful for patient.  LCSW encouraged patient to continue processing by writing more of her emotions out during the week. LCSW also processed with patient locus of control to reduce stress and anxiety. Patient will return in two days for follow up.  Christene Lye MSW, LCSW Duration 90 minutes

## 2014-01-04 ENCOUNTER — Ambulatory Visit: Payer: Medicare Other | Attending: Family Medicine | Admitting: *Deleted

## 2014-01-04 DIAGNOSIS — F339 Major depressive disorder, recurrent, unspecified: Secondary | ICD-10-CM

## 2014-01-04 NOTE — Progress Notes (Signed)
Patient presented in order for LCSW to reassess in reflection of patient distress earlier in the week.  Patient expressed that her depressive mood had improved and that she was feeling better.  Patient stated that she no longer has been having suicidal ideation and that she had picked up to maintain her medication.  LCSW processed this with patient to ensure ongoing medication compliance.  Patient stated that she continues to have concerns about 1 of her medications.  LCSW encouraged patient to not stop the medication until she has consulted with the psychiatric provider. LCSW also encouraged patient to begin creating plans and tasks and complete them in order to further assess and manage symptoms related to lethargy. Patient will return within 1 week for follow up  Christene Lye MSW, LCSW Duration 60 minutes

## 2014-01-23 ENCOUNTER — Encounter: Payer: Self-pay | Admitting: Cardiology

## 2014-01-27 ENCOUNTER — Other Ambulatory Visit: Payer: Medicare Other

## 2014-01-30 ENCOUNTER — Ambulatory Visit: Payer: Medicare Other | Attending: Internal Medicine | Admitting: *Deleted

## 2014-01-30 DIAGNOSIS — F339 Major depressive disorder, recurrent, unspecified: Secondary | ICD-10-CM

## 2014-01-30 NOTE — Progress Notes (Signed)
LCSW met with patient in order to continue processing challenges with depressed mood.  Patient identified that she was currently having increased anxiety due to several stressors  Patient stated that she was currently stressed about her job, finances, family and goals.  LCSW reframed job stressors into clearly definable decisions to be made.  LCSW also reframed decisions about family.  LCSW processed with patient stressors that she identified about her spiritual life and patient was able to clearly identify her next direction.  Patient could identify clearer direction in the other areas, but had not made a clear decision yet.  LCSW assigned patient to have some clearly identified goals and to move forward with processing within the next 7 days.    Christene Lye MSW, LCSW Duration 60 minutes.

## 2014-05-31 ENCOUNTER — Other Ambulatory Visit: Payer: Self-pay | Admitting: Obstetrics & Gynecology

## 2014-12-01 LAB — HM PAP SMEAR: HM Pap smear: NEGATIVE

## 2014-12-18 ENCOUNTER — Encounter: Payer: Self-pay | Admitting: Primary Care

## 2015-01-04 ENCOUNTER — Encounter: Payer: Self-pay | Admitting: Gastroenterology

## 2015-03-07 ENCOUNTER — Ambulatory Visit: Payer: Medicare Other | Admitting: Gastroenterology

## 2015-03-25 HISTORY — PX: OTHER SURGICAL HISTORY: SHX169

## 2015-05-09 ENCOUNTER — Telehealth: Payer: Self-pay | Admitting: Gastroenterology

## 2015-05-09 ENCOUNTER — Encounter: Payer: Self-pay | Admitting: Gastroenterology

## 2015-05-09 ENCOUNTER — Other Ambulatory Visit (INDEPENDENT_AMBULATORY_CARE_PROVIDER_SITE_OTHER): Payer: Medicare HMO

## 2015-05-09 ENCOUNTER — Ambulatory Visit (INDEPENDENT_AMBULATORY_CARE_PROVIDER_SITE_OTHER): Payer: Medicare HMO | Admitting: Gastroenterology

## 2015-05-09 VITALS — BP 120/80 | HR 84 | Ht 66.5 in | Wt 202.8 lb

## 2015-05-09 DIAGNOSIS — R1013 Epigastric pain: Secondary | ICD-10-CM

## 2015-05-09 DIAGNOSIS — R110 Nausea: Secondary | ICD-10-CM

## 2015-05-09 DIAGNOSIS — D509 Iron deficiency anemia, unspecified: Secondary | ICD-10-CM

## 2015-05-09 LAB — CBC WITH DIFFERENTIAL/PLATELET
Basophils Absolute: 0 10*3/uL (ref 0.0–0.1)
Basophils Relative: 0.5 % (ref 0.0–3.0)
Eosinophils Absolute: 0 10*3/uL (ref 0.0–0.7)
Eosinophils Relative: 1 % (ref 0.0–5.0)
HCT: 39.1 % (ref 36.0–46.0)
Hemoglobin: 13.1 g/dL (ref 12.0–15.0)
Lymphocytes Relative: 40.5 % (ref 12.0–46.0)
Lymphs Abs: 1.9 10*3/uL (ref 0.7–4.0)
MCHC: 33.6 g/dL (ref 30.0–36.0)
MCV: 76.7 fl — ABNORMAL LOW (ref 78.0–100.0)
Monocytes Absolute: 0.3 10*3/uL (ref 0.1–1.0)
Monocytes Relative: 6.4 % (ref 3.0–12.0)
Neutro Abs: 2.5 10*3/uL (ref 1.4–7.7)
Neutrophils Relative %: 51.6 % (ref 43.0–77.0)
Platelets: 223 10*3/uL (ref 150.0–400.0)
RBC: 5.09 Mil/uL (ref 3.87–5.11)
RDW: 14.4 % (ref 11.5–15.5)
WBC: 4.8 10*3/uL (ref 4.0–10.5)

## 2015-05-09 LAB — IBC PANEL
Iron: 99 ug/dL (ref 42–145)
Saturation Ratios: 24 % (ref 20.0–50.0)
Transferrin: 295 mg/dL (ref 212.0–360.0)

## 2015-05-09 LAB — FERRITIN: Ferritin: 37.5 ng/mL (ref 10.0–291.0)

## 2015-05-09 NOTE — Telephone Encounter (Signed)
See lab results for additional details.  

## 2015-05-09 NOTE — Progress Notes (Signed)
    History of Present Illness: This is a 55 year old referred by Kevan Ny, MD for the evaluation of epigastric pain, nausea, history of iron deficiency. She complains of fairly constant epigastric pain and frequent nausea. She takes omeprazole 40 mg daily and Zofran frequently as needed. She states she had iron deficiency anemia last year and was treated for several months. She brings blood work from May 2016 showing a normal hematocrit of 37.6 and low MCV of 75.4. Underwent colonoscopy in November 2013 showing for small adenomatous colon polyps. She underwent EGD in January 2002 which was normal. She was treated with dicyclomine which she said led to worsening abdominal pain should she discontinued it. She has constipation and occasional rectal discomfort when passing hard stools. Denies weight loss, diarrhea, change in stool caliber, melena, hematochezia, nausea, vomiting, dysphagia, chest pain.  Review of Systems: Pertinent positive and negative review of systems were noted in the above HPI section. All other review of systems were otherwise negative.  Current Medications, Allergies, Past Medical History, Past Surgical History, Family History and Social History were reviewed in [employer] record.  Physical Exam: General: Well developed, well nourished, no acute distress Head: Normocephalic and atraumatic Eyes:  sclerae anicteric, EOMI Ears: Normal auditory acuity Mouth: No deformity or lesions Neck: Supple, no masses or thyromegaly Lungs: Clear throughout to auscultation Heart: Regular rate and rhythm; no murmurs, rubs or bruits Abdomen: Soft, non tender and non distended. No masses, hepatosplenomegaly or hernias noted. Normal Bowel sounds Musculoskeletal: Symmetrical with no gross deformities  Skin: No lesions on visible extremities Pulses:  Normal pulses noted Extremities: No clubbing, cyanosis, edema or deformities noted Neurological: Alert oriented x 4, grossly  nonfocal Cervical Nodes:  No significant cervical adenopathy Inguinal Nodes: No significant inguinal adenopathy Psychological:  Alert and cooperative. Normal mood and affect  Assessment and Recommendations:  1. Epigastric pain, nausea, history of iron deficiency anemia. Intensify antireflux measures. Obtain CBC, iron, TIBC and ferritin today. Continue omeprazole 40 mg daily. Schedule EGD. The risks (including bleeding, perforation, infection, missed lesions, medication reactions and possible hospitalization or surgery if complications occur), benefits, and alternatives to endoscopy with possible biopsy and possible dilation were discussed with the patient and they consent to proceed.   2. Personal history of adenomatous colon polyps. Five-year interval surveillance colonoscopy recommended in November 2018.  cc: Kevan Ny, MD

## 2015-05-09 NOTE — Patient Instructions (Signed)
Your physician has requested that you go to the basement for  lab work before leaving today.  You have been scheduled for an endoscopy. Please follow written instructions given to you at your visit today. If you use inhalers (even only as needed), please bring them with you on the day of your procedure. Your physician has requested that you go to www.startemmi.com and enter the access code given to you at your visit today. This web site gives a general overview about your procedure. However, you should still follow specific instructions given to you by our office regarding your preparation for the procedure.  Thank you for choosing me and [facility].  Pricilla Riffle. Dagoberto Ligas., MD., Marval Regal  cc: Kevan Ny, MD

## 2015-06-26 ENCOUNTER — Ambulatory Visit (AMBULATORY_SURGERY_CENTER): Payer: Medicare HMO | Admitting: Gastroenterology

## 2015-06-26 ENCOUNTER — Encounter: Payer: Self-pay | Admitting: Gastroenterology

## 2015-06-26 VITALS — BP 115/81 | HR 96 | Temp 99.5°F | Resp 25 | Ht 66.0 in | Wt 202.0 lb

## 2015-06-26 DIAGNOSIS — R1013 Epigastric pain: Secondary | ICD-10-CM

## 2015-06-26 DIAGNOSIS — K2900 Acute gastritis without bleeding: Secondary | ICD-10-CM

## 2015-06-26 DIAGNOSIS — K2960 Other gastritis without bleeding: Secondary | ICD-10-CM

## 2015-06-26 DIAGNOSIS — K2950 Unspecified chronic gastritis without bleeding: Secondary | ICD-10-CM | POA: Diagnosis not present

## 2015-06-26 MED ORDER — SODIUM CHLORIDE 0.9 % IV SOLN: 500.0000 mL | INTRAVENOUS | Status: DC

## 2015-06-26 MED ORDER — OMEPRAZOLE 40 MG PO CPDR: 40.0000 mg | DELAYED_RELEASE_CAPSULE | Freq: Two times a day (BID) | ORAL | Status: DC

## 2015-06-26 NOTE — Progress Notes (Signed)
Called to room to assist during endoscopic procedure.  Patient ID and intended procedure confirmed with present staff. Received instructions for my participation in the procedure from the performing physician.  

## 2015-06-26 NOTE — Op Note (Signed)
[facility] Patient Name: [name] Procedure Date: 06/26/2015 3:20 PM MRN: [id number] Endoscopist: Ladene Artist , MD Age: 55 Referring MD:  Date of Birth: 05/06/1960 Gender: Female Procedure:                Upper GI endoscopy Indications:              Epigastric abdominal pain Medicines:                Monitored Anesthesia Care Procedure:                Pre-Anesthesia Assessment:                           - Prior to the procedure, a History and Physical                            was performed, and patient medications and                            allergies were reviewed. The patient's tolerance of                            previous anesthesia was also reviewed. The risks                            and benefits of the procedure and the sedation                            options and risks were discussed with the patient.                            All questions were answered, and informed consent                            was obtained. Prior Anticoagulants: The patient has                            taken no previous anticoagulant or antiplatelet                            agents. ASA Grade Assessment: II - A patient with                            mild systemic disease. After reviewing the risks                            and benefits, the patient was deemed in                            satisfactory condition to undergo the procedure.                           After obtaining informed consent, the endoscope was  passed under direct vision. Throughout the                            procedure, the patient's blood pressure, pulse, and                            oxygen saturations were monitored continuously. The                            Model GIF-HQ190 [id number] scope was introduced                            through the mouth, and advanced to the second part                            of duodenum. The upper GI endoscopy was                        accomplished without difficulty. The patient                            tolerated the procedure well. Scope In: Scope Out: Findings:      LA Grade A (one or more mucosal breaks less than 5 mm, not extending       between tops of 2 mucosal folds) esophagitis with no bleeding was found       in the distal esophagus.      The exam of the esophagus was otherwise normal.      Multiple localized, non-bleeding erosions were found in the gastric       antrum. There were no stigmata of recent bleeding. Biopsies were taken       with a cold forceps for histology.      The exam of the stomach was otherwise normal.      The cardia and gastric fundus were normal on retroflexion.      The duodenal bulb and second portion of the duodenum were normal. Complications:            No immediate complications. Estimated Blood Loss:     Estimated blood loss was minimal. Impression:               - LA Grade A reflux esophagitis.                           - Non-bleeding erosive antral gastropathy. Biopsied.                           - Normal duodenal bulb and second portion of the                            duodenum. Recommendation:           - Patient has a contact number available for                            emergencies. The signs and symptoms of potential  delayed complications were discussed with the                            patient. Return to normal activities tomorrow.                            Written discharge instructions were provided to the                            patient.                           - Resume previous diet with antireflux measures.                           - Await pathology                           - Avoid or at least minimize aspirin, ibuprofen,                            naproxen, or other non-steroidal anti-inflammatory                            drugs.                           - Use Prilosec (omeprazole) 40 mg PO BID ac,  1 year                            of refills, indefinitely. Ladene Artist, MD 06/26/2015 3:41:03 PM This report has been signed electronically. Number of Addenda: 0 Referring MD:      Juluis Mire, NP

## 2015-06-26 NOTE — Patient Instructions (Signed)
Discharge instructions given. Handout on gastritis. Resume previous medications. Prescription sent in to pharmacy. YOU HAD AN ENDOSCOPIC PROCEDURE TODAY AT [facility]:   Refer to the procedure report that was given to you for any specific questions about what was found during the examination.  If the procedure report does not answer your questions, please call your gastroenterologist to clarify.  If you requested that your care partner not be given the details of your procedure findings, then the procedure report has been included in a sealed envelope for you to review at your convenience later.  YOU SHOULD EXPECT: Some feelings of bloating in the abdomen. Passage of more gas than usual.  Walking can help get rid of the air that was put into your GI tract during the procedure and reduce the bloating. If you had a lower endoscopy (such as a colonoscopy or flexible sigmoidoscopy) you may notice spotting of blood in your stool or on the toilet paper. If you underwent a bowel prep for your procedure, you may not have a normal bowel movement for a few days.  Please Note:  You might notice some irritation and congestion in your nose or some drainage.  This is from the oxygen used during your procedure.  There is no need for concern and it should clear up in a day or so.  SYMPTOMS TO REPORT IMMEDIATELY:    Following upper endoscopy (EGD)  Vomiting of blood or coffee ground material  New chest pain or pain under the shoulder blades  Painful or persistently difficult swallowing  New shortness of breath  Fever of 100F or higher  Black, tarry-looking stools  For urgent or emergent issues, a gastroenterologist can be reached at any hour by calling [phone].   DIET: Your first meal following the procedure should be a small meal and then it is ok to progress to your normal diet. Heavy or fried foods are harder to digest and may make you feel nauseous or bloated.  Likewise, meals  heavy in dairy and vegetables can increase bloating.  Drink plenty of fluids but you should avoid alcoholic beverages for 24 hours.  ACTIVITY:  You should plan to take it easy for the rest of today and you should NOT DRIVE or use heavy machinery until tomorrow (because of the sedation medicines used during the test).    FOLLOW UP: Our staff will call the number listed on your records the next business day following your procedure to check on you and address any questions or concerns that you may have regarding the information given to you following your procedure. If we do not reach you, we will leave a message.  However, if you are feeling well and you are not experiencing any problems, there is no need to return our call.  We will assume that you have returned to your regular daily activities without incident.  If any biopsies were taken you will be contacted by phone or by letter within the next 1-3 weeks.  Please call us at [phone] if you have not heard about the biopsies in 3 weeks.    SIGNATURES/CONFIDENTIALITY: You and/or your care partner have signed paperwork which will be entered into your electronic medical record.  These signatures attest to the fact that that the information above on your After Visit Summary has been reviewed and is understood.  Full responsibility of the confidentiality of this discharge information lies with you and/or your care-partner.

## 2015-06-26 NOTE — Progress Notes (Signed)
Report to PACU, RN, vss, BBS= Clear.  

## 2015-06-27 ENCOUNTER — Telehealth: Payer: Self-pay | Admitting: *Deleted

## 2015-06-27 NOTE — Telephone Encounter (Signed)
  Follow up Call-  Call back number 06/26/2015  Post procedure Call Back phone  # [phone]  Permission to leave phone message Yes     Patient questions:  Do you have a fever, pain , or abdominal swelling? No. Pain Score  0 *  Have you tolerated food without any problems? Yes.    Have you been able to return to your normal activities? Yes.    Do you have any questions about your discharge instructions: Diet   No. Medications  No. Follow up visit  No.  Do you have questions or concerns about your Care? No.  Actions: * If pain score is 4 or above: No action needed, pain <4.  Pt c/o sore throat, encouraged her to use OTC products as needed. She verbalizes understanding and states she will fell better when she starts omeprazole.

## 2015-07-02 ENCOUNTER — Encounter: Payer: Self-pay | Admitting: Gastroenterology

## 2015-09-29 ENCOUNTER — Emergency Department (HOSPITAL_COMMUNITY)
Admission: EM | Admit: 2015-09-29 | Discharge: 2015-09-29 | Disposition: A | Discharge status: Home / Self Care | Payer: Medicare HMO | Attending: Emergency Medicine | Admitting: Emergency Medicine

## 2015-09-29 ENCOUNTER — Encounter (HOSPITAL_COMMUNITY): Payer: Self-pay | Admitting: Emergency Medicine

## 2015-09-29 ENCOUNTER — Emergency Department (HOSPITAL_COMMUNITY): Payer: Medicare HMO

## 2015-09-29 DIAGNOSIS — Z7982 Long term (current) use of aspirin: Secondary | ICD-10-CM | POA: Insufficient documentation

## 2015-09-29 DIAGNOSIS — R109 Unspecified abdominal pain: Secondary | ICD-10-CM | POA: Diagnosis present

## 2015-09-29 DIAGNOSIS — Z79899 Other long term (current) drug therapy: Secondary | ICD-10-CM | POA: Diagnosis not present

## 2015-09-29 DIAGNOSIS — J45909 Unspecified asthma, uncomplicated: Secondary | ICD-10-CM | POA: Insufficient documentation

## 2015-09-29 DIAGNOSIS — K529 Noninfective gastroenteritis and colitis, unspecified: Secondary | ICD-10-CM

## 2015-09-29 LAB — URINALYSIS, ROUTINE W REFLEX MICROSCOPIC
Bilirubin Urine: NEGATIVE
Glucose, UA: NEGATIVE mg/dL
Hgb urine dipstick: NEGATIVE
Ketones, ur: NEGATIVE mg/dL
Nitrite: NEGATIVE
Protein, ur: NEGATIVE mg/dL
Specific Gravity, Urine: 1.026 (ref 1.005–1.030)
pH: 5.5 (ref 5.0–8.0)

## 2015-09-29 LAB — CBC
HCT: 38.5 % (ref 36.0–46.0)
Hemoglobin: 12.5 g/dL (ref 12.0–15.0)
MCH: 25.2 pg — ABNORMAL LOW (ref 26.0–34.0)
MCHC: 32.5 g/dL (ref 30.0–36.0)
MCV: 77.6 fL — ABNORMAL LOW (ref 78.0–100.0)
Platelets: 191 10*3/uL (ref 150–400)
RBC: 4.96 MIL/uL (ref 3.87–5.11)
RDW: 14.3 % (ref 11.5–15.5)
WBC: 7.5 10*3/uL (ref 4.0–10.5)

## 2015-09-29 LAB — LIPASE, BLOOD: Lipase: 30 U/L (ref 11–51)

## 2015-09-29 LAB — COMPREHENSIVE METABOLIC PANEL
ALT: 16 U/L (ref 14–54)
AST: 21 U/L (ref 15–41)
Albumin: 3.7 g/dL (ref 3.5–5.0)
Alkaline Phosphatase: 72 U/L (ref 38–126)
Anion gap: 10 (ref 5–15)
BUN: 12 mg/dL (ref 6–20)
CO2: 22 mmol/L (ref 22–32)
Calcium: 9.6 mg/dL (ref 8.9–10.3)
Chloride: 105 mmol/L (ref 101–111)
Creatinine, Ser: 0.93 mg/dL (ref 0.44–1.00)
GFR calc Af Amer: >60 mL/min (ref >60)
GFR calc non Af Amer: >60 mL/min (ref >60)
Glucose, Bld: 123 mg/dL — ABNORMAL HIGH (ref 65–99)
Potassium: 3.7 mmol/L (ref 3.5–5.1)
Sodium: 137 mmol/L (ref 135–145)
Total Bilirubin: 0.3 mg/dL (ref 0.3–1.2)
Total Protein: 7.3 g/dL (ref 6.5–8.1)

## 2015-09-29 LAB — I-STAT CG4 LACTIC ACID, ED
Lactic Acid, Venous: 2.1 mmol/L (ref 0.5–1.9)
Lactic Acid, Venous: 0.92 mmol/L (ref 0.5–1.9)

## 2015-09-29 LAB — URINE MICROSCOPIC-ADD ON: RBC / HPF: NONE SEEN RBC/hpf (ref 0–5)

## 2015-09-29 LAB — I-STAT TROPONIN, ED: Troponin i, poc: 0 ng/mL (ref 0.00–0.08)

## 2015-09-29 LAB — TROPONIN I: Troponin I: <0.03 ng/mL (ref <0.03)

## 2015-09-29 MED ORDER — ONDANSETRON HCL 4 MG/2ML IJ SOLN
4.0000 mg | Freq: Once | INTRAMUSCULAR | Status: AC
  Administered 2015-09-29: 4 mg via INTRAVENOUS
  Filled 2015-09-29: qty 2

## 2015-09-29 MED ORDER — FAMOTIDINE IN NACL 20-0.9 MG/50ML-% IV SOLN
20.0000 mg | Freq: Once | INTRAVENOUS | Status: AC
  Administered 2015-09-29: 20 mg via INTRAVENOUS
  Filled 2015-09-29: qty 50

## 2015-09-29 MED ORDER — SODIUM CHLORIDE 0.9 % IV BOLUS (SEPSIS): 1000.0000 mL | Freq: Once | INTRAVENOUS | Status: DC

## 2015-09-29 MED ORDER — SODIUM CHLORIDE 0.9 % IV BOLUS (SEPSIS)
1000.0000 mL | Freq: Once | INTRAVENOUS | Status: AC
  Administered 2015-09-29: 1000 mL via INTRAVENOUS

## 2015-09-29 MED ORDER — ONDANSETRON 4 MG PO TBDP: 4.0000 mg | ORAL_TABLET | Freq: Three times a day (TID) | ORAL | Status: DC | PRN

## 2015-09-29 MED ORDER — KETOROLAC TROMETHAMINE 30 MG/ML IJ SOLN
30.0000 mg | Freq: Once | INTRAMUSCULAR | Status: AC
  Administered 2015-09-29: 30 mg via INTRAVENOUS
  Filled 2015-09-29: qty 1

## 2015-09-29 NOTE — Discharge Instructions (Signed)
Encourage adequate hydration, drink plenty of fluids. Take Zofran as needed for nausea. Follow up with your primary care doctor for reevaluation next week. Return to the ED if you experience fevers, chills, blood in vomit, blood in stool. Continue home omeprazole.

## 2015-09-29 NOTE — ED Notes (Signed)
Pt is in stable condition upon d/c and ambulates from ED. 

## 2015-09-29 NOTE — ED Notes (Signed)
Charge RN aware of lactic

## 2015-09-29 NOTE — ED Notes (Addendum)
C/o nausea since Thursday night with diarrhea.  Reports upper abd pain that radiates to back, epigastric pain, chills, belching, and vomiting since 9pm last night.

## 2015-09-29 NOTE — ED Provider Notes (Signed)
CSN: [id number]     Arrival date & time 09/29/15  [id number] History   First MD Initiated Contact with Patient 09/29/15 [phone]     Chief Complaint  Patient presents with  . Abdominal Pain  . Emesis     (Consider location/radiation/quality/duration/timing/severity/associated sxs/prior Treatment) HPI  CLYDIA DURFEY is a 55 y.o F with a pmhx of IBS, GERD who presents the ED today complaining of vomiting and diarrhea. Patient states that she frequently has nausea and diarrhea and is seeing a gastroenterologist for these symptoms. However, 2 days ago patient reports increase in feeling nausea. She took, omeprazole and Zofran without relief. Yesterday she ate pork skins and subsequently developed lower abdominal cramping and diarrhea. Abdominal cramping is relieved with her bowel movements. Patient then had 2 episodes of nonbloody, nonbilious emesis last night. When she woke up this morning she continued to have diarrhea and then had diffuse muscle aches. She was concerned she may be coming down with the flu. She reports associated chills but no fever. She denies any melena, hematochezia, hematemesis, dizziness. No sick contacts. Past Medical History  Diagnosis Date  . Depression   . Anxiety   . Fibroid   . Chlamydia   . Trichomonas   . IBS (irritable bowel syndrome)   . Arthritis   . Asthma     stress induced asthma  . Tubular adenoma of colon 01/2012  . GERD (gastroesophageal reflux disease)   . Hyperlipidemia   . Obesity    Past Surgical History  Procedure Laterality Date  . Labial cyst removal     Family History  Problem Relation Age of Onset  . Diabetes Father   . Diabetes Mother   . Hypertension Mother   . Deep vein thrombosis Mother   . Colon cancer Maternal Grandmother   . Rectal cancer Neg Hx   . Stomach cancer Neg Hx   . Cancer - Other Mother     peritoneal cancer  . Pancreatic cancer Neg Hx   . Kidney disease Neg Hx   . Liver disease Neg Hx   . Colon polyps Maternal  Grandmother    Social History  Substance Use Topics  . Smoking status: Never Smoker   . Smokeless tobacco: Never Used  . Alcohol Use: 0.0 oz/week    0 Standard drinks or equivalent per week     Comment: occ, not weekly   OB History    Gravida Para Term Preterm AB TAB SAB Ectopic Multiple Living   4 2 0 0 2 0 2   2     Review of Systems  All other systems reviewed and are negative.     Allergies  Review of patient's allergies indicates no known allergies.  Home Medications   Prior to Admission medications   Medication Sig Start Date End Date Taking? Authorizing Provider  albuterol (PROVENTIL HFA;VENTOLIN HFA) 108 (90 BASE) MCG/ACT inhaler Inhale 2 puffs into the lungs every 6 (six) hours as needed. Pt states she doesn't presently have an inhaler, uses very rarely    Historical Provider, MD  amphetamine-dextroamphetamine (ADDERALL) 30 MG tablet  06/19/15   Historical Provider, MD  aspirin 81 MG tablet Take 81 mg by mouth daily.      Historical Provider, MD  Black Cohosh 200 MG CAPS Take 1 capsule by mouth daily.    Historical Provider, MD  calcium-vitamin D (OSCAL WITH D) 500-200 MG-UNIT per tablet Take 1 tablet by mouth daily.    Historical Provider, MD  clonazePAM (KLONOPIN) 0.5 MG tablet Take 0.5 mg by mouth 3 (three) times daily as needed. For anxiety.    Historical Provider, MD  dicyclomine (BENTYL) 20 MG tablet Take 20 mg by mouth 3 (three) times daily before meals.    Historical Provider, MD  ferrous sulfate 325 (65 FE) MG tablet Take 325 mg by mouth daily with breakfast. Reported on 06/26/2015    Historical Provider, MD  FLUoxetine (PROZAC) 40 MG capsule  06/19/15   Historical Provider, MD  Ibuprofen (MIDOL) 200 MG CAPS Take 2 capsules by mouth daily as needed. For pain    Historical Provider, MD  meclizine (ANTIVERT) 25 MG tablet Take 25 mg by mouth 4 (four) times daily as needed for dizziness.    Historical Provider, MD  omeprazole (PRILOSEC) 40 MG capsule Take 1 capsule  (40 mg total) by mouth 2 (two) times daily. 06/26/15   Ladene Artist, MD  ondansetron (ZOFRAN) 4 MG tablet Take 4 mg by mouth 2 (two) times daily as needed for nausea or vomiting. Reported on 06/26/2015    Historical Provider, MD  vitamin B-12 (CYANOCOBALAMIN) 500 MCG tablet Take 500 mcg by mouth daily.      Historical Provider, MD  zolpidem (AMBIEN) 10 MG tablet Take 10 mg by mouth at bedtime as needed for sleep.    Historical Provider, MD   BP 115/74 mmHg  Pulse 115  Temp(Src) 99.5 F (37.5 C) (Oral)  Resp 22  SpO2 96%  LMP 06/23/2014 Physical Exam  Constitutional: She is oriented to person, place, and time. She appears well-developed and well-nourished. No distress.  HENT:  Head: Normocephalic and atraumatic.  Mouth/Throat: No oropharyngeal exudate.  Eyes: Conjunctivae and EOM are normal. Pupils are equal, round, and reactive to light. Right eye exhibits no discharge. Left eye exhibits no discharge. No scleral icterus.  Cardiovascular: Normal rate, regular rhythm, normal heart sounds and intact distal pulses.  Exam reveals no gallop and no friction rub.   No murmur heard. Pulmonary/Chest: Effort normal and breath sounds normal. No respiratory distress. She has no wheezes. She has no rales. She exhibits no tenderness.  Abdominal: Soft. She exhibits no distension. There is no tenderness. There is no guarding.  Musculoskeletal: Normal range of motion. She exhibits no edema.  Neurological: She is alert and oriented to person, place, and time.  Skin: Skin is warm and dry. No rash noted. She is not diaphoretic. No erythema. No pallor.  Psychiatric: She has a normal mood and affect. Her behavior is normal.  Nursing note and vitals reviewed.   ED Course  Procedures (including critical care time) Labs Review Labs Reviewed  COMPREHENSIVE METABOLIC PANEL - Abnormal; Notable for the following:    Glucose, Bld 123 (*)    All other components within normal limits  CBC - Abnormal; Notable for  the following:    MCV 77.6 (*)    MCH 25.2 (*)    All other components within normal limits  URINALYSIS, ROUTINE W REFLEX MICROSCOPIC (NOT AT [facility]) - Abnormal; Notable for the following:    APPearance CLOUDY (*)    Leukocytes, UA SMALL (*)    All other components within normal limits  URINE MICROSCOPIC-ADD ON - Abnormal; Notable for the following:    Squamous Epithelial / LPF 0-5 (*)    Bacteria, UA RARE (*)    Casts HYALINE CASTS (*)    All other components within normal limits  I-STAT CG4 LACTIC ACID, ED - Abnormal; Notable for the following:  Lactic Acid, Venous 2.10 (*)    All other components within normal limits  URINE CULTURE  LIPASE, BLOOD  TROPONIN I    Imaging Review Dg Chest 2 View  09/29/2015  CLINICAL DATA:  Acute onset of nausea, vomiting and dry cough. Headache and body aches. Initial encounter. EXAM: CHEST  2 VIEW COMPARISON:  Chest radiograph performed 11/22/2010 FINDINGS: The lungs are well-aerated and clear. There is no evidence of focal opacification, pleural effusion or pneumothorax. The heart is normal in size; the mediastinal contour is within normal limits. No acute osseous abnormalities are seen. IMPRESSION: No acute cardiopulmonary process seen. Electronically Signed   By: Garald Balding M.D.   On: 09/29/2015 06:50   I have personally reviewed and evaluated these images and lab results as part of my medical decision-making.   EKG Interpretation None      MDM   Final diagnoses:  Gastroenteritis    55 y.o F with a pmhx of IBS, GERD presents to the ED today c/o diarrhea and vomiting onset yesterday. Pt has hx of similar symptoms but states that today her symptoms became more frequent and she began experiencing diffuse body aches. Pt appears well in the ED, non-toxic and non-septic appearing. Initial HR is 115. No metabolic derrangement. UA negative for infection. No leukocytosis. Abd soft and NTTP. Lactic mildly elevated at 2.1, suspect this is secondary  to dehydration. Pt given 2L NS, zofran and toradol with significant symptomatic relief. Upon re-exam, HR now 99. No further episodes of diarrhea or emesis in the ED. Pt states that she feels much better and is ready to go home. Repeat lactic now 0.92. Symptoms likely related to gastroenteritis. Feel that pt is safe for discharge with PCP follow up nect week. Will give rx for Zofran. Pt is hemodynamically stable and ready for discharge. Return precautions outlined in patient discharge instructions.      Dondra Spry Mason, PA-C 09/29/15 1001  Merryl Hacker, MD 10/02/15 2256

## 2015-09-30 LAB — URINE CULTURE

## 2015-12-21 ENCOUNTER — Emergency Department (HOSPITAL_COMMUNITY)
Admission: EM | Admit: 2015-12-21 | Discharge: 2015-12-22 | Disposition: A | Discharge status: Home / Self Care | Payer: Medicare HMO | Attending: Emergency Medicine | Admitting: Emergency Medicine

## 2015-12-21 ENCOUNTER — Emergency Department (HOSPITAL_COMMUNITY): Payer: Medicare HMO

## 2015-12-21 ENCOUNTER — Encounter (HOSPITAL_COMMUNITY): Payer: Self-pay | Admitting: *Deleted

## 2015-12-21 DIAGNOSIS — Y999 Unspecified external cause status: Secondary | ICD-10-CM | POA: Insufficient documentation

## 2015-12-21 DIAGNOSIS — R42 Dizziness and giddiness: Secondary | ICD-10-CM | POA: Diagnosis not present

## 2015-12-21 DIAGNOSIS — Y939 Activity, unspecified: Secondary | ICD-10-CM | POA: Diagnosis not present

## 2015-12-21 DIAGNOSIS — R0602 Shortness of breath: Secondary | ICD-10-CM

## 2015-12-21 DIAGNOSIS — J45909 Unspecified asthma, uncomplicated: Secondary | ICD-10-CM | POA: Diagnosis not present

## 2015-12-21 DIAGNOSIS — Y92480 Sidewalk as the place of occurrence of the external cause: Secondary | ICD-10-CM | POA: Diagnosis not present

## 2015-12-21 DIAGNOSIS — Z7982 Long term (current) use of aspirin: Secondary | ICD-10-CM | POA: Diagnosis not present

## 2015-12-21 DIAGNOSIS — M79651 Pain in right thigh: Secondary | ICD-10-CM | POA: Diagnosis not present

## 2015-12-21 DIAGNOSIS — W010XXA Fall on same level from slipping, tripping and stumbling without subsequent striking against object, initial encounter: Secondary | ICD-10-CM | POA: Diagnosis not present

## 2015-12-21 DIAGNOSIS — R079 Chest pain, unspecified: Secondary | ICD-10-CM

## 2015-12-21 DIAGNOSIS — R072 Precordial pain: Secondary | ICD-10-CM | POA: Insufficient documentation

## 2015-12-21 LAB — CBC
HCT: 38.5 % (ref 36.0–46.0)
Hemoglobin: 12.2 g/dL (ref 12.0–15.0)
MCH: 25.2 pg — ABNORMAL LOW (ref 26.0–34.0)
MCHC: 31.7 g/dL (ref 30.0–36.0)
MCV: 79.5 fL (ref 78.0–100.0)
Platelets: 220 10*3/uL (ref 150–400)
RBC: 4.84 MIL/uL (ref 3.87–5.11)
RDW: 14.1 % (ref 11.5–15.5)
WBC: 6.7 10*3/uL (ref 4.0–10.5)

## 2015-12-21 LAB — BASIC METABOLIC PANEL
Anion gap: 8 (ref 5–15)
BUN: 7 mg/dL (ref 6–20)
CO2: 27 mmol/L (ref 22–32)
Calcium: 9.5 mg/dL (ref 8.9–10.3)
Chloride: 105 mmol/L (ref 101–111)
Creatinine, Ser: 0.96 mg/dL (ref 0.44–1.00)
GFR calc Af Amer: >60 mL/min (ref >60)
GFR calc non Af Amer: >60 mL/min (ref >60)
Glucose, Bld: 110 mg/dL — ABNORMAL HIGH (ref 65–99)
Potassium: 3.9 mmol/L (ref 3.5–5.1)
Sodium: 140 mmol/L (ref 135–145)

## 2015-12-21 LAB — VITAMIN B12: Vitamin B-12: 1254 pg/mL — ABNORMAL HIGH (ref 180–914)

## 2015-12-21 LAB — I-STAT TROPONIN, ED: Troponin i, poc: 0 ng/mL (ref 0.00–0.08)

## 2015-12-21 LAB — TROPONIN I: Troponin I: <0.03 ng/mL (ref <0.03)

## 2015-12-21 LAB — FOLATE: Folate: 19.7 ng/mL (ref >5.9)

## 2015-12-21 LAB — LIPASE, BLOOD: Lipase: 40 U/L (ref 11–51)

## 2015-12-21 MED ORDER — ASPIRIN 81 MG PO CHEW
324.0000 mg | CHEWABLE_TABLET | Freq: Once | ORAL | Status: AC
  Administered 2015-12-21: 324 mg via ORAL
  Filled 2015-12-21: qty 4

## 2015-12-21 MED ORDER — LIDOCAINE VISCOUS 2 % MT SOLN: 15.0000 mL | Freq: Once | OROMUCOSAL | Status: DC

## 2015-12-21 MED ORDER — SIMETHICONE 40 MG/0.6ML PO SUSP (UNIT DOSE)
40.0000 mg | Freq: Once | ORAL | Status: AC
  Administered 2015-12-21: 40 mg via ORAL
  Filled 2015-12-21: qty 0.6

## 2015-12-21 MED ORDER — IOPAMIDOL (ISOVUE-370) INJECTION 76%
INTRAVENOUS | Status: AC
  Administered 2015-12-21: 100 mL
  Filled 2015-12-21: qty 100

## 2015-12-21 MED ORDER — GI COCKTAIL ~~LOC~~
30.0000 mL | Freq: Once | ORAL | Status: AC
  Administered 2015-12-21: 30 mL via ORAL
  Filled 2015-12-21: qty 30

## 2015-12-21 MED ORDER — SUCRALFATE 1 G PO TABS: 1.0000 g | ORAL_TABLET | Freq: Three times a day (TID) | ORAL | 0 refills | Status: DC

## 2015-12-21 NOTE — ED Notes (Signed)
Patient stated that she fell at daughter school off sidewalk around [place]. Her right foot twisted off sidewalk and landed on left side. She said left side hurts along with left pinky finger.

## 2015-12-21 NOTE — ED Provider Notes (Signed)
[name] DEPT Provider Note  CSN: [id number] Arrival Date & Time: 12/21/15 @ 1621  History    Chief Complaint Chief Complaint  Patient presents with  . Chest Pain    HPI [name] is a 55 y.o. female. Patient endorses chest pain that has been present for 2 weeks. Associated and present with abdominal pain. Described as pressure & burning, located over the midsternum. Associated w/ belching. Worse w/ eating and laying down. Not exertional. No previous DVT, CAD, or PE. Family history of PE in mother and grandmother. Abdominal pain dull ache & burning in epigastrium. Also worse w/ food and laying flat. Pressure in chest relieved w/ standing. Has history of reflux, takes omeprazole.  No fevers or chills, or back pain. No urinary symptoms or VB/VB. No diaphoresis or sweating.  No vertigo or near syncope. Mechanical fall today when slipped off sidewalk. Pain per left ankle resolved however mild ache in mid left thigh and left small digit. No wounds. No HA or neck pain. No leg weakness or abnormal sensation. No previous CVA or TIA.  Past Medical & Surgical History    Past Medical History:  Diagnosis Date  . Anxiety   . Arthritis   . Asthma    stress induced asthma  . Chlamydia   . Depression   . Fibroid   . GERD (gastroesophageal reflux disease)   . Hyperlipidemia   . IBS (irritable bowel syndrome)   . Obesity   . Trichomonas   . Tubular adenoma of colon 01/2012   Patient Active Problem List   Diagnosis Date Noted  . Chest pain 05/31/2012  . Dyslipidemia 05/31/2012  . VAGINAL PRURITUS 01/11/2010  . SINUSITIS, ACUTE 01/04/2010  . EUSTACHIAN TUBE DYSFUNCTION, BILATERAL 07/20/2009  . KNEE PAIN, LEFT 07/20/2009  . LYMPHADENOPATHY 07/20/2009  . TRICHOMONAL VAGINITIS 02/06/2009  . INSOMNIA 02/06/2009  . ANEMIA, MILD 11/16/2008  . GERD 08/30/2008  . DIZZINESS 08/30/2008  . BIPOLAR DISORDER UNSPECIFIED 07/27/2007  . ANXIETY STATE NOS 10/29/2006  . DISORDER,  DEPRESSIVE NEC 10/29/2006  . ALLERGIC RHINITIS 10/29/2006  . IRRITABLE BOWEL SYNDROME 10/29/2006  . HYPERCHOLESTEROLEMIA, MILD 09/01/2005  . DISORDER, LIVER NOS 08/02/2002   Past Surgical History:  Procedure Laterality Date  . labial cyst removal      Family & Social History    Family History  Problem Relation Age of Onset  . Diabetes Father   . Diabetes Mother   . Hypertension Mother   . Deep vein thrombosis Mother   . Cancer - Other Mother     peritoneal cancer  . Colon cancer Maternal Grandmother   . Colon polyps Maternal Grandmother   . Rectal cancer Neg Hx   . Stomach cancer Neg Hx   . Pancreatic cancer Neg Hx   . Kidney disease Neg Hx   . Liver disease Neg Hx    Social History  Substance Use Topics  . Smoking status: Never Smoker  . Smokeless tobacco: Never Used  . Alcohol use 0.0 oz/week     Comment: occ, not weekly    Home Medications    Prior to Admission medications   Medication Sig Start Date End Date Taking? Authorizing Provider  albuterol (PROVENTIL HFA;VENTOLIN HFA) 108 (90 BASE) MCG/ACT inhaler Inhale 2 puffs into the lungs every 6 (six) hours as needed for wheezing.    Yes Historical Provider, MD  amphetamine-dextroamphetamine (ADDERALL) 30 MG tablet Take 45 mg by mouth daily.  06/19/15  Yes Historical Provider, MD  aspirin 81 MG  tablet Take 81 mg by mouth daily.     Yes Historical Provider, MD  b complex vitamins tablet Take 1 tablet by mouth daily.   Yes Historical Provider, MD  Cholecalciferol (VITAMIN D-3 PO) Take 1 capsule by mouth daily.   Yes Historical Provider, MD  clonazePAM (KLONOPIN) 0.5 MG tablet Take 0.5 mg by mouth 3 (three) times daily as needed. For anxiety.   Yes Historical Provider, MD  FLUoxetine (PROZAC) 40 MG capsule Take 20 mg by mouth 3 (three) times daily as needed (anxiety).  06/19/15  Yes Historical Provider, MD  Ibuprofen (MIDOL) 200 MG CAPS Take 2 capsules by mouth daily as needed. For pain   Yes Historical Provider, MD    meclizine (ANTIVERT) 25 MG tablet Take 25 mg by mouth 4 (four) times daily as needed for dizziness.   Yes Historical Provider, MD  omeprazole (PRILOSEC) 40 MG capsule Take 1 capsule (40 mg total) by mouth 2 (two) times daily. 06/26/15  Yes Ladene Artist, MD  ondansetron (ZOFRAN ODT) 4 MG disintegrating tablet Take 1 tablet (4 mg total) by mouth every 8 (eight) hours as needed for nausea or vomiting. 09/29/15  Yes Samantha Tripp Dowless, PA-C  ondansetron (ZOFRAN) 4 MG tablet Take 4 mg by mouth 2 (two) times daily as needed for nausea or vomiting. Reported on 09/29/2015   Yes Historical Provider, MD  zolpidem (AMBIEN) 10 MG tablet Take 10 mg by mouth at bedtime as needed for sleep.   Yes Historical Provider, MD  sucralfate (CARAFATE) 1 g tablet Take 1 tablet (1 g total) by mouth 4 (four) times daily -  with meals and at bedtime. 12/21/15 12/26/15  Voncille Lo, MD    Allergies    Review of patient's allergies indicates no known allergies.  I reviewed & agree with nursing's documentation on the patient's past medical, surgical, social & family histories as well as their allergies.  Review of Systems  Complete ROS obtained, and is negative except as stated in HPI.   Physical Exam  Updated Vital Signs BP 124/79   Pulse 84   Temp 98.1 F (36.7 C) (Oral)   Resp (!) 27   Ht 5\' 6"  (1.676 m)   Wt 94.8 kg   LMP 06/23/2014   SpO2 96%   BMI 33.73 kg/m  I have reviewed the triage vital signs and the nursing notes. Physical Exam CONST: Patient alert, well appearing, in no apparent distress.  EYES: PERRLA. EOMI. Conjunctiva w/o d/c. Lids AT w/o swelling.  ENMT: External Nares & Ears AT w/o swelling. Oropharynx patent. MM moist.  NECK: ROM full w/o rigidity. Trachea midline. JVD absent.  CVS: +S1/S2 w/o obvious murmur. Lower extremities w/o pitting edema.  RESP: Respiratory effort unlabored w/o retractions & accessory muscle use. BS clear bilaterally.  GI: Soft & ND. +BS x 4. TTP absent. Hernia  absent. Guarding & Rebound absent.  BACK: CVA TTP absent bilaterally.  SKIN: Skin warm & dry. Turgor good. No rash. No wound or laceration.  PSYCH: Alert. Oriented. Affect and mood appropriate.  NEURO: CN II-XII grossly intact. Motor exam symmetric w/ upper & lower extremities 5/5 bilaterally. Sensation grossly intact. DTR in lower extremities intact. Romberg, rapid alternating, FTN and HTS testing reveals no abnormalities.  MSK: Joints located & stable, w/o obvious dislocation & obvious deformity or crepitus absent w/ Cap refill < 2 sec. Peripheral pulses 2+ & equal in all extremities. Mild TTP over right thigh w/o groin pain or worsening of pain upon bearing  weight or ambulating.   ED Treatments & Results   Labs (only abnormal results are displayed) Labs Reviewed  BASIC METABOLIC PANEL - Abnormal; Notable for the following:       Result Value   Glucose, Bld 110 (*)    All other components within normal limits  CBC - Abnormal; Notable for the following:    MCH 25.2 (*)    All other components within normal limits  VITAMIN B12 - Abnormal; Notable for the following:    Vitamin B-12 1,254 (*)    All other components within normal limits  TROPONIN I  LIPASE, BLOOD  FOLATE  I-STAT TROPOININ, ED    EKG    EKG Interpretation  Date/Time:  Friday December 21 2015 16:33:16 EDT Ventricular Rate:  101 PR Interval:  140 QRS Duration: 84 QT Interval:  368 QTC Calculation: 477 R Axis:   10 Text Interpretation:  Sinus tachycardia Minimal voltage criteria for LVH, may be normal variant Borderline ECG Confirmed by [facility] MD, Corene Cornea [id number]) on 12/21/2015 9:26:49 PM       Radiology No results found.  Pertinent labs & imaging results that were available during my care of the patient were personally visualized by me and considered in my medical decision making, please see chart for details.  Procedures (including critical care time) Procedures  Medications Ordered in ED Medications    simethicone (MYLICON) 40 0000000 suspension 40 mg (40 mg Oral Given 12/21/15 2145)  aspirin chewable tablet 324 mg (324 mg Oral Given 12/21/15 2145)  gi cocktail (Maalox,Lidocaine,Donnatal) (30 mLs Oral Given 12/21/15 2145)  iopamidol (ISOVUE-370) 76 % injection (100 mLs  Contrast Given 12/21/15 2159)    Initial Impression & Assessment / Evaluation & Plan / ED Course & Final Dispo   Initial Impression & Assessment Patient presents w/ CP that does not sound typical or anginal in nature. No previous CAD. HEAR Clinical Decision Tool used for ACS Risk Stratification. Their Risk Score was 3. (A HEAR Score of 0-3 is Low, a Score of 4-8 is High). Patient at risk for PE given concern per the patient for heritable hypercoagulable disease in family. Therefore obtained screening labs and CTA Chest for PE. ASA 324 mg given. ECG shows sinus tachycardia. No acute STEMI or arrhythmia or Long QT or Preexcitation Syndrome.  Given patient has no known CAD, risk stratification of the patient's H&P, risk factors & ECG was performed. I also obtained serial cardiac troponin to further evaluate for ACS. In light of above, I believe their risk for ACS to be low. Therefore through shared decision making with the patient, will consider for discharge if troponin remain undetectable. Patient in agreement.   Abdominal exam benign. Per symptoms and recent endoscopy I have considered and estimate there is low risk for Pancreatitis, Appendicitis, Cholecystitis, Perforated Viscous, Aortic Dissection or PTX or PNA as the patient's presentation is not consistent.  Also fall appears to be mechanical, full Neuro testing reveals no deficits. I have considered and estimate there is low risk for CVA/TIA or GBS as the patient's presentation is not consistent. Did send folate and Vitamin B12 levels which will followed up by PCP, patient states understanding to do such if d/c'd. Do not have concern for acute traumatic fracture or dislocation  per endorsements and exam. No wounds.  Evaluation & Plan Foalte and Vitb12, Troponin x2 & CMP and CBC and Lipase all unremarkable. CXR and CTA chest show no acute processes or concern for PE.  Chart Review of their  Endoscopy 06/26/15 does show lesions and believe to be most likely. - LA Grade A (one or more mucosal breaks less than 5 mm, not extending between tops of 2 mucosal folds) esophagitis with no bleeding was found in the distal esophagus. - The exam of the esophagus was otherwise normal. - Multiple localized, non-bleeding erosions were found in the gastric antrum. There were no stigmata of recent bleeding. Biopsies were taken with a cold forceps for histology. - The exam of the stomach was otherwise normal. - The cardia and gastric fundus were normal on retroflexion. - The duodenal bulb and second portion of the duodenum were normal.  Final Dispo Reassessment of the patient reveals no further concerns. GI cocktail and simethicone have resolved symptoms. Provided carafate upon d/c as is on PPI.  ED Course in its entirety, care plan & clinical impressions w/ associated risks were reviewed w/ the patient. In light of the patient's reassuring evaluation above, I consider discharge disposition reasonable. They are in agreement. I gave my typical, strict return precautions in simple, non-technical language. We also discussed symptoms that are most concerning & would necessitate emergent return. I explicitly told them to immediately return to the ED if new symptoms develop, if worse, or for ANY concern. Treatments & follow up plan agreed upon & I confirmed all concerns & questions were addressed prior to discharge.  Follow Up Kerin Perna, NP [address] [place] [address] [phone]  Schedule an appointment as soon as possible for a visit in 2 days For symptomatic reassessment  [facility] [id number] [place] [phone] Go to  [place] IF WORSE   New Prescriptions Discharge Medication List as of 12/21/2015 11:37 PM    START taking these medications   Details  sucralfate (CARAFATE) 1 g tablet Take 1 tablet (1 g total) by mouth 4 (four) times daily -  with meals and at bedtime., Starting Fri 12/21/2015, Until Wed 12/26/2015, Print        Final Clinical Impressions(s) & ED Diagnoses   1. Chest pain, unspecified chest pain type   2. SOB (shortness of breath)   3. Dizzy spells    Patient care discussed with the attending physician, who oversaw their evaluation & treatment & voiced agreement. Note: This document was prepared using Dragon voice recognition software and may include unintentional dictation errors.  House Officer: Voncille Lo, MD, Emergency Medicine Resident.   Voncille Lo, MD 12/26/15 1059    Merrily Pew, MD 12/26/15 [phone]

## 2015-12-21 NOTE — ED Notes (Signed)
Pt understood dc material. NAD noted. 

## 2015-12-21 NOTE — ED Triage Notes (Signed)
The pt is c/o chest  Abdomen pain since yesterday  With dizziness and nausea burping a lot and diarrhea

## 2015-12-25 ENCOUNTER — Telehealth: Payer: Self-pay | Admitting: Gastroenterology

## 2015-12-25 NOTE — Telephone Encounter (Signed)
[name] called tonight.  She was in er 3 night ago with cp, sob, belching.  CT chest, EKG, bloodwork all normal. She was told it may be GI related.  She felt a bit better after gi cocktail.  Pains are back, seem to be in epigastrium, low chest.  Unsure what is causing them but she is not in acute distress.  NO nausea, vomiting, no fevers or chills.  She's been taking 800 ibuprofen daily for several weeks.  Perhaps that is causing dyspepsia, gastric irritation.  I recommended she stop the NSAIDs, continue her PPI daily and call the office in the morning about setting up follow up appt.

## 2015-12-26 ENCOUNTER — Telehealth: Payer: Self-pay | Admitting: Gastroenterology

## 2015-12-26 NOTE — Telephone Encounter (Signed)
Patient reports abdominal pain and weakness.  She reports that she only feels better after a BM.  She reports that she used two enema's last night and got some relief. She reports that she ate two cans of "butter beans" to help induce a BM.  She reports that she is feeling about the same as she was last night when she spoke with Dr. Ardis Hughs.  He has advised that she go to the ED.  She is scheduled to see Dr. Fuller Plan on Monday, but she feels she can't wait this long.  She is advised that she should proceed to the ED if she is that uncomfortable.  Patient will keep her appt for Monday with Dr. Fuller Plan

## 2015-12-31 ENCOUNTER — Ambulatory Visit (INDEPENDENT_AMBULATORY_CARE_PROVIDER_SITE_OTHER): Payer: Medicare HMO | Admitting: Gastroenterology

## 2015-12-31 ENCOUNTER — Encounter: Payer: Self-pay | Admitting: Gastroenterology

## 2015-12-31 VITALS — BP 128/70 | HR 74 | Ht 66.0 in | Wt 204.5 lb

## 2015-12-31 DIAGNOSIS — R1084 Generalized abdominal pain: Secondary | ICD-10-CM | POA: Diagnosis not present

## 2015-12-31 DIAGNOSIS — Z86010 Personal history of colonic polyps: Secondary | ICD-10-CM

## 2015-12-31 DIAGNOSIS — K2100 Gastro-esophageal reflux disease with esophagitis, without bleeding: Secondary | ICD-10-CM

## 2015-12-31 DIAGNOSIS — K5900 Constipation, unspecified: Secondary | ICD-10-CM

## 2015-12-31 NOTE — Patient Instructions (Addendum)
Start taking over the counter Miralax mixing 17 grams in 8 oz of water 1-2 x daily every day x 3 weeks.   If your symptoms have not improved in 3 weeks, then please call our office back.  High-Fiber Diet Fiber, also called dietary fiber, is a type of carbohydrate found in fruits, vegetables, whole grains, and beans. A high-fiber diet can have many health benefits. Your health care provider may recommend a high-fiber diet to help:  Prevent constipation. Fiber can make your bowel movements more regular.  Lower your cholesterol.  Relieve hemorrhoids, uncomplicated diverticulosis, or irritable bowel syndrome.  Prevent overeating as part of a weight-loss plan.  Prevent heart disease, type 2 diabetes, and certain cancers. WHAT IS MY PLAN? The recommended daily intake of fiber includes:  38 grams for men under age 62.  68 grams for men over age 59.  28 grams for women under age 27.  60 grams for women over age 49. You can get the recommended daily intake of dietary fiber by eating a variety of fruits, vegetables, grains, and beans. Your health care provider may also recommend a fiber supplement if it is not possible to get enough fiber through your diet. WHAT DO I NEED TO KNOW ABOUT A HIGH-FIBER DIET?  Fiber supplements have not been widely studied for their effectiveness, so it is better to get fiber through food sources.  Always check the fiber content on thenutrition facts label of any prepackaged food. Look for foods that contain at least 5 grams of fiber per serving.  Ask your dietitian if you have questions about specific foods that are related to your condition, especially if those foods are not listed in the following section.  Increase your daily fiber consumption gradually. Increasing your intake of dietary fiber too quickly may cause bloating, cramping, or gas.  Drink plenty of water. Water helps you to digest fiber. WHAT FOODS CAN I EAT? Grains Whole-grain breads.  Multigrain cereal. Oats and oatmeal. Brown rice. Barley. Bulgur wheat. [place]. Bran muffins. Popcorn. Rye wafer crackers. Vegetables Sweet potatoes. Spinach. Kale. Artichokes. Cabbage. Broccoli. Green peas. Carrots. Squash. Fruits Berries. Pears. Apples. Oranges. Avocados. Prunes and raisins. Dried figs. Meats and Other Protein Sources [place], kidney, pinto, and soy beans. Split peas. Lentils. Nuts and seeds. Dairy Fiber-fortified yogurt. Beverages Fiber-fortified soy milk. Fiber-fortified orange juice. Other Fiber bars. The items listed above may not be a complete list of recommended foods or beverages. Contact your dietitian for more options. WHAT FOODS ARE NOT RECOMMENDED? Grains White bread. Pasta made with refined flour. White rice. Vegetables Fried potatoes. Canned vegetables. Well-cooked vegetables.  Fruits Fruit juice. Cooked, strained fruit. Meats and Other Protein Sources Fatty cuts of meat. Fried Sales executive or fried fish. Dairy Milk. Yogurt. Cream cheese. Sour cream. Beverages Soft drinks. Other Cakes and pastries. Butter and oils. The items listed above may not be a complete list of foods and beverages to avoid. Contact your dietitian for more information. WHAT ARE SOME TIPS FOR INCLUDING HIGH-FIBER FOODS IN MY DIET?  Eat a wide variety of high-fiber foods.  Make sure that half of all grains consumed each day are whole grains.  Replace breads and cereals made from refined flour or white flour with whole-grain breads and cereals.  Replace white rice with brown rice, bulgur wheat, or millet.  Start the day with a breakfast that is high in fiber, such as a cereal that contains at least 5 grams of fiber per serving.  Use beans in place of  meat in soups, salads, or pasta.  Eat high-fiber snacks, such as berries, raw vegetables, nuts, or popcorn.   This information is not intended to replace advice given to you by your health care provider. Make sure you discuss any  questions you have with your health care provider.   Document Released: 03/10/2005 Document Revised: 03/31/2014 Document Reviewed: 08/23/2013 Elsevier Interactive Patient Education 2016 Elsevier Inc.   Normal BMI (Body Mass Index- based on height and weight) is between 19 and 25. Your BMI today is Body mass index is 33.01 kg/m. Marland Kitchen Please consider follow up  regarding your BMI with your Primary Care Provider.   Thank you for choosing me and [facility].  Pricilla Riffle. Dagoberto Ligas., MD., Marval Regal

## 2015-12-31 NOTE — Progress Notes (Signed)
    History of Present Illness: This is a 55 year old female who has had worsening problems with constipation, generalized abdominal pain, abdominal bloating and chest pain. She was evaluated in the ED and I have reviewed that evaluation. Blood work was unremarkable. Chest CT is below. She had been taking ibuprofen 800 mg daily for several weeks and we recommended that she stop this. She was given a GI cocktail and her symptoms temporarily improved. She mentioned problems with constipation at her last office visit with me in February. These problems have gradually worsened since then with significant straining, hard stools, incomplete evacuation, generalized abdominal pain and bloating. Colonoscopy in 01/2012 showed 4 small adenomatous colon polyps and internal hemorrhoids. She underwent EGD in 06/2015 showing LA class grade a esophagitis. Denies weight loss, diarrhea, change in stool caliber, melena, hematochezia, nausea, vomiting, dysphagia.  CT chest angiogram 12/21/2015 IMPRESSION: No evidence of significant pulmonary embolus. No evidence of active pulmonary disease.  Current Medications, Allergies, Past Medical History, Past Surgical History, Family History and Social History were reviewed in [employer] record.  Physical Exam: General: Well developed, well nourished, no acute distress Head: Normocephalic and atraumatic Eyes:  sclerae anicteric, EOMI Ears: Normal auditory acuity Mouth: No deformity or lesions Lungs: Clear throughout to auscultation Heart: Regular rate and rhythm; no murmurs, rubs or bruits Abdomen: Soft, mild epigastric tenderness and non distended. No masses, hepatosplenomegaly or hernias noted. Normal Bowel sounds Musculoskeletal: Symmetrical with no gross deformities  Pulses:  Normal pulses noted Extremities: No clubbing, cyanosis, edema or deformities noted Neurological: Alert oriented x 4, grossly nonfocal Psychological:  Alert and cooperative.  Normal mood and affect  Assessment and Recommendations:  1. Constipation with associated abdominal pain and abdominal bloating. High-fiber diet with adequate fluid intake. Begin MiraLAX once or twice daily titrated for adequate bowel movements. Consider abdominal/pelvic CT if symptoms do not improve with better mgmt of constipation. Consider Linzess, Amitza or Trulance if her symptoms are not adequately managed with Miralax.  2. Chest pain. Etiology unclear. Could be reflux related or NSAID induced gastritis. Continue omeprazole 40 mg twice daily and standard antireflux measures. A daily 81 mg aspirin is okay however she is advised avoid any additional NSAID products. May use Zantac and/or Tums for breakthrough GERD symptoms. Follow-up with PCP for further evaluation of chest pain if not resolved with these measures.  3. Personal history of adenomatous colon polyps. Five-year interval surveillance colonoscopy recommended in November 2018.

## 2016-02-05 ENCOUNTER — Other Ambulatory Visit: Payer: Self-pay | Admitting: Gastroenterology

## 2016-02-05 DIAGNOSIS — R1013 Epigastric pain: Secondary | ICD-10-CM

## 2016-02-05 DIAGNOSIS — K2960 Other gastritis without bleeding: Secondary | ICD-10-CM

## 2016-02-07 DIAGNOSIS — J343 Hypertrophy of nasal turbinates: Secondary | ICD-10-CM | POA: Insufficient documentation

## 2016-02-13 ENCOUNTER — Other Ambulatory Visit: Payer: Self-pay | Admitting: Obstetrics & Gynecology

## 2016-02-13 DIAGNOSIS — D259 Leiomyoma of uterus, unspecified: Secondary | ICD-10-CM

## 2016-02-20 ENCOUNTER — Inpatient Hospital Stay: Admission: RE | Admit: 2016-02-20 | Payer: Medicare HMO | Source: Ambulatory Visit

## 2016-02-27 ENCOUNTER — Other Ambulatory Visit (HOSPITAL_COMMUNITY): Payer: Self-pay | Admitting: Interventional Radiology

## 2016-02-27 ENCOUNTER — Ambulatory Visit
Admission: RE | Admit: 2016-02-27 | Discharge: 2016-02-27 | Disposition: A | Discharge status: Home / Self Care | Payer: Medicare HMO | Source: Ambulatory Visit | Attending: Obstetrics & Gynecology | Admitting: Obstetrics & Gynecology

## 2016-02-27 DIAGNOSIS — D259 Leiomyoma of uterus, unspecified: Secondary | ICD-10-CM

## 2016-02-27 DIAGNOSIS — D251 Intramural leiomyoma of uterus: Secondary | ICD-10-CM

## 2016-02-27 HISTORY — PX: IR GENERIC HISTORICAL: IMG1180011

## 2016-02-27 NOTE — Consult Note (Signed)
Chief Complaint: Patient was seen in consultation today for  Chief Complaint  Patient presents with  . Advice Only    Consult for [place]     at the request of [place]  Referring Physician(s): [place]  Supervising Physician: Arne Cleveland  Patient Status: [facility] outpt  History of Present Illness: [name] is a 55 y.o. female who presents for consultation regarding her uterine fibroids. She was diagnosed with fibroids A 16 years ago. She currently describes pelvic pain, back pain, urinary frequency and urgency, nocturia, stress incontinence, and nausea, change in stool habits, and other GI symptoms. She is G4 P2 post tubal ligation. She is perimenopausal/menopausal, her last perceived menstrual cycle 2 years ago. She's having no abnormal uterine bleeding. She's had no previous fibroid surgeries or therapies. Only gynecologic infections Chlamydia and vaginitis. Ultrasound examinations in September 2014 and January 2016 revealed multiple intramural fibroids.  Past Medical History:  Diagnosis Date  . Anxiety   . Arthritis   . Asthma    stress induced asthma  . Chlamydia   . Depression   . Fibroid   . GERD (gastroesophageal reflux disease)   . Hyperlipidemia   . IBS (irritable bowel syndrome)   . Obesity   . Trichomonas   . Tubular adenoma of colon 01/2012    Past Surgical History:  Procedure Laterality Date  . COLPOSCOPY  1990   results were normal  . labial cyst removal      Allergies: Patient has no known allergies.  Medications: Prior to Admission medications   Medication Sig Start Date End Date Taking? Authorizing Provider  albuterol (PROVENTIL HFA;VENTOLIN HFA) 108 (90 BASE) MCG/ACT inhaler Inhale 2 puffs into the lungs every 6 (six) hours as needed for wheezing.    Yes Historical Provider, MD  amphetamine-dextroamphetamine (ADDERALL) 30 MG tablet Take 45 mg by mouth daily.  06/19/15  Yes Historical Provider, MD  aspirin 81 MG tablet Take 81 mg  by mouth daily.     Yes Historical Provider, MD  b complex vitamins tablet Take 1 tablet by mouth daily.   Yes Historical Provider, MD  Cholecalciferol (VITAMIN D-3 PO) Take 1 capsule by mouth daily.   Yes Historical Provider, MD  clonazePAM (KLONOPIN) 0.5 MG tablet Take 0.5 mg by mouth 3 (three) times daily as needed. For anxiety.   Yes Historical Provider, MD  FLUoxetine (PROZAC) 40 MG capsule Take 20 mg by mouth 3 (three) times daily as needed (anxiety).  06/19/15  Yes Historical Provider, MD  Ibuprofen (MIDOL) 200 MG CAPS Take 2 capsules by mouth daily as needed. For pain   Yes Historical Provider, MD  meclizine (ANTIVERT) 25 MG tablet Take 25 mg by mouth 4 (four) times daily as needed for dizziness.   Yes Historical Provider, MD  Multiple Vitamins-Minerals (MULTIVITAMIN WITH MINERALS) tablet Take 1 tablet by mouth daily.   Yes Historical Provider, MD  omeprazole (PRILOSEC) 40 MG capsule TAKE 1 CAPSULE (40 MG TOTAL) BY MOUTH 2 (TWO) TIMES DAILY. 02/05/16  Yes Ladene Artist, MD  ondansetron (ZOFRAN ODT) 4 MG disintegrating tablet Take 1 tablet (4 mg total) by mouth every 8 (eight) hours as needed for nausea or vomiting. 09/29/15  Yes Samantha Tripp Dowless, PA-C  zolpidem (AMBIEN) 10 MG tablet Take 10 mg by mouth at bedtime as needed for sleep.   Yes Historical Provider, MD  ondansetron (ZOFRAN) 4 MG tablet Take 4 mg by mouth 2 (two) times daily as needed for nausea or vomiting.  Reported on 09/29/2015    Historical Provider, MD  sucralfate (CARAFATE) 1 g tablet Take 1 tablet (1 g total) by mouth 4 (four) times daily -  with meals and at bedtime. 12/21/15 12/26/15  Voncille Lo, MD     Family History  Problem Relation Age of Onset  . Diabetes Father   . Diabetes Mother   . Hypertension Mother   . Deep vein thrombosis Mother   . Cancer - Other Mother     peritoneal cancer  . Colon cancer Maternal Grandmother   . Colon polyps Maternal Grandmother   . Rectal cancer Neg Hx   . Stomach cancer Neg  Hx   . Pancreatic cancer Neg Hx   . Kidney disease Neg Hx   . Liver disease Neg Hx     Social History   Social History  . Marital status: Single    Spouse name: N/A  . Number of children: 2  . Years of education: N/A   Social History Main Topics  . Smoking status: Never Smoker  . Smokeless tobacco: Never Used  . Alcohol use 0.0 oz/week     Comment: occ, not weekly  . Drug use: No  . Sexual activity: Not on file   Other Topics Concern  . Not on file   Social History Narrative  . No narrative on file    ECOG Status: 0 - Asymptomatic  Review of Systems: A 12 point ROS discussed and pertinent positives are indicated in the HPI above.  All other systems are negative.  Review of Systems  Vital Signs: BP 118/75 (BP Location: Left Arm, Patient Position: Sitting, Cuff Size: Normal)   Pulse (!) 113   Temp 98.2 F (36.8 C) (Oral)   Resp 16   Ht 5\' 6"  (1.676 m)   Wt 215 lb (97.5 kg)   LMP 06/23/2014   SpO2 99%   BMI 34.70 kg/m   Physical Exam  Mallampati Score:     Imaging: TRANSABDOMINAL ULTRASOUND OF PELVIS  Technique:  Transabdominal ultrasound examination of the pelvis was performed including evaluation of the uterus, ovaries, adnexal regions, and pelvic cul-de-sac.  Comparison:  Ultrasound 12/07/2010.  CT 12/09/2010.  Findings:  Uterus:  The uterus is mildly anteverted.  The uterus measures 10.5 x 6.3 x 6.7 cm.  Multiple myometrial masses are seen consistent with intramural uterine leiomyomas.  The largest is in the area of junction of body and fundus and is intramural and measures 4.1 x 4.0 x 3.0 cm.  Adjacent to it is another fibroid measuring 2.7 x 2.3 x 2.5 cm.  Two small fibroids are seen in the body measuring 1.3 x 120 x 1.1 cm and 2.0 x 1.2 x 1.9 cm respectively.  Endometrium: Endometrial thickness is 8.5 mm.  No endometrial mass or fluid collection is identified.  Right ovary: Right ovary measures 2.8 x 2.4 x 2.3 cm.  There is  a small cyst which appears simple.  The cyst measures 2.8 x 2.4 x 2.3 cm.  Left ovary: The left ovary cannot be visualized.  Other Findings:  No free fluid was seen in the pelvis.  IMPRESSION: Multiple nodular myometrial masses are seen consistent with intramural uterine leiomyomas.  No endometrial abnormality is evident.  No right ovarian or right adnexal lesion is seen.  Left ovary cannot be visualized.  No left adnexal lesion was seen.  No free fluid was seen in the pelvis.   Original Report Authenticated By: Shanon Brow Call   Labs:  CBC:  Recent Labs  05/09/15 1119 09/29/15 0607 12/21/15 1631  WBC 4.8 7.5 6.7  HGB 13.1 12.5 12.2  HCT 39.1 38.5 38.5  PLT 223.0 191 220    COAGS: No results for input(s): INR, APTT in the last 8760 hours.  BMP:  Recent Labs  09/29/15 0607 12/21/15 1631  NA 137 140  K 3.7 3.9  CL 105 105  CO2 22 27  GLUCOSE 123* 110*  BUN 12 7  CALCIUM 9.6 9.5  CREATININE 0.93 0.96  GFRNONAA >60 >60  GFRAA >60 >60    LIVER FUNCTION TESTS:  Recent Labs  09/29/15 0607  BILITOT 0.3  AST 21  ALT 16  ALKPHOS 72  PROT 7.3  ALBUMIN 3.7    TUMOR MARKERS: No results for input(s): AFPTM, CEA, CA199, CHROMGRNA in the last 8760 hours.  Assessment and Plan:  My impression is that this patient has multiple presenting symptoms, many of which can be associated with uterine fibroids. She has known uterine fibroids. However, given her menopausal status I'm not sure that viable uterine fibroids are the etiology in this case. We discussed the pathophysiology of uterine fibroids. We discussed classic fibroid related symptoms including bleeding and bulk symptoms. We discussed treatment options including watchful waiting, fibroid embolization, and hysterectomy. We reviewed in detail the uterine fibroid embolization technique, anticipated benefits, time course of symptom resolution, possible risks and complications, and need for continued gynecologic  care.  I think it would be at reasonable at this point to obtain an MR pelvis with contrast as this would be required pre-UFE to exclude any pedunculated uterine fibroids on a narrow stalk. In this case the scan would also be useful to assess the extent of fibroid involution versus hypervascular viable fibroid disease, and reassess the overall uterine size compared to previous CT imaging of 2014 in order to better determine whether fibroid disease is likely to result in the bulk symptoms corresponding to some of her presenting symptoms. She seemed to understand, asked appropriate questions, and took notes. She was willing to proceed with outpatient MRI, and then once we see this  can discuss the next step. We'll go ahead and set this up at her convenience.  Thank you for this interesting consult.  I greatly enjoyed meeting [name] and look forward to participating in their care.  A copy of this report was sent to the requesting provider on this date.  Electronically Signed: Yamil Oelke III, DAYNE Jacquel Redditt 02/27/2016, 3:33 PM   I spent a total of  40 Minutes   in face to face in clinical consultation, greater than 50% of which was counseling/coordinating care for uterine fibroids.

## 2016-03-04 DIAGNOSIS — J3489 Other specified disorders of nose and nasal sinuses: Secondary | ICD-10-CM | POA: Insufficient documentation

## 2016-03-06 ENCOUNTER — Ambulatory Visit (HOSPITAL_COMMUNITY)
Admission: RE | Admit: 2016-03-06 | Discharge: 2016-03-06 | Disposition: A | Discharge status: Home / Self Care | Payer: Medicare HMO | Source: Ambulatory Visit | Attending: Interventional Radiology | Admitting: Interventional Radiology

## 2016-03-06 DIAGNOSIS — D251 Intramural leiomyoma of uterus: Secondary | ICD-10-CM | POA: Diagnosis present

## 2016-03-06 MED ORDER — GADOBENATE DIMEGLUMINE 529 MG/ML IV SOLN
20.0000 mL | Freq: Once | INTRAVENOUS | Status: AC | PRN
  Administered 2016-03-06: 20 mL via INTRAVENOUS

## 2016-03-10 ENCOUNTER — Other Ambulatory Visit: Payer: Self-pay | Admitting: Interventional Radiology

## 2016-03-10 DIAGNOSIS — D251 Intramural leiomyoma of uterus: Secondary | ICD-10-CM

## 2016-03-19 ENCOUNTER — Other Ambulatory Visit: Payer: Self-pay | Admitting: General Surgery

## 2016-03-21 ENCOUNTER — Ambulatory Visit (HOSPITAL_COMMUNITY)
Admission: RE | Admit: 2016-03-21 | Discharge: 2016-03-21 | Disposition: A | Discharge status: Home / Self Care | Payer: Medicare HMO | Source: Ambulatory Visit | Attending: Interventional Radiology | Admitting: Interventional Radiology

## 2016-03-21 ENCOUNTER — Observation Stay (HOSPITAL_COMMUNITY)
Admission: RE | Admit: 2016-03-21 | Discharge: 2016-03-22 | Disposition: A | Discharge status: Home / Self Care | Payer: Medicare HMO | Source: Ambulatory Visit | Attending: Interventional Radiology | Admitting: Interventional Radiology

## 2016-03-21 ENCOUNTER — Encounter (HOSPITAL_COMMUNITY): Payer: Self-pay

## 2016-03-21 DIAGNOSIS — D259 Leiomyoma of uterus, unspecified: Secondary | ICD-10-CM | POA: Diagnosis not present

## 2016-03-21 DIAGNOSIS — Z79899 Other long term (current) drug therapy: Secondary | ICD-10-CM | POA: Insufficient documentation

## 2016-03-21 DIAGNOSIS — D251 Intramural leiomyoma of uterus: Secondary | ICD-10-CM

## 2016-03-21 DIAGNOSIS — K219 Gastro-esophageal reflux disease without esophagitis: Secondary | ICD-10-CM | POA: Insufficient documentation

## 2016-03-21 DIAGNOSIS — J45909 Unspecified asthma, uncomplicated: Secondary | ICD-10-CM | POA: Diagnosis not present

## 2016-03-21 DIAGNOSIS — Z6832 Body mass index (BMI) 32.0-32.9, adult: Secondary | ICD-10-CM | POA: Insufficient documentation

## 2016-03-21 DIAGNOSIS — K5900 Constipation, unspecified: Secondary | ICD-10-CM | POA: Diagnosis not present

## 2016-03-21 DIAGNOSIS — F419 Anxiety disorder, unspecified: Secondary | ICD-10-CM | POA: Insufficient documentation

## 2016-03-21 DIAGNOSIS — D219 Benign neoplasm of connective and other soft tissue, unspecified: Secondary | ICD-10-CM

## 2016-03-21 DIAGNOSIS — F329 Major depressive disorder, single episode, unspecified: Secondary | ICD-10-CM | POA: Insufficient documentation

## 2016-03-21 DIAGNOSIS — E669 Obesity, unspecified: Secondary | ICD-10-CM | POA: Insufficient documentation

## 2016-03-21 DIAGNOSIS — Z7982 Long term (current) use of aspirin: Secondary | ICD-10-CM | POA: Diagnosis not present

## 2016-03-21 HISTORY — PX: IR GENERIC HISTORICAL: IMG1180011

## 2016-03-21 LAB — CBC WITH DIFFERENTIAL/PLATELET
Basophils Absolute: 0 10*3/uL (ref 0.0–0.1)
Basophils Relative: 0 %
Eosinophils Absolute: 0.1 10*3/uL (ref 0.0–0.7)
Eosinophils Relative: 2 %
HCT: 38.4 % (ref 36.0–46.0)
Hemoglobin: 12.9 g/dL (ref 12.0–15.0)
Lymphocytes Relative: 48 %
Lymphs Abs: 2.2 10*3/uL (ref 0.7–4.0)
MCH: 25.6 pg — ABNORMAL LOW (ref 26.0–34.0)
MCHC: 33.6 g/dL (ref 30.0–36.0)
MCV: 76.2 fL — ABNORMAL LOW (ref 78.0–100.0)
Monocytes Absolute: 0.3 10*3/uL (ref 0.1–1.0)
Monocytes Relative: 6 %
Neutro Abs: 2 10*3/uL (ref 1.7–7.7)
Neutrophils Relative %: 44 %
Platelets: 254 10*3/uL (ref 150–400)
RBC: 5.04 MIL/uL (ref 3.87–5.11)
RDW: 14.1 % (ref 11.5–15.5)
WBC: 4.5 10*3/uL (ref 4.0–10.5)

## 2016-03-21 LAB — PROTIME-INR
INR: 0.97
Prothrombin Time: 12.9 seconds (ref 11.4–15.2)

## 2016-03-21 LAB — BASIC METABOLIC PANEL
Anion gap: 10 (ref 5–15)
BUN: 12 mg/dL (ref 6–20)
CO2: 24 mmol/L (ref 22–32)
Calcium: 9.3 mg/dL (ref 8.9–10.3)
Chloride: 103 mmol/L (ref 101–111)
Creatinine, Ser: 0.67 mg/dL (ref 0.44–1.00)
GFR calc Af Amer: >60 mL/min (ref >60)
GFR calc non Af Amer: >60 mL/min (ref >60)
Glucose, Bld: 104 mg/dL — ABNORMAL HIGH (ref 65–99)
Potassium: 3.8 mmol/L (ref 3.5–5.1)
Sodium: 137 mmol/L (ref 135–145)

## 2016-03-21 LAB — HCG, SERUM, QUALITATIVE: Preg, Serum: NEGATIVE

## 2016-03-21 MED ORDER — SODIUM CHLORIDE 0.9 % IV SOLN
INTRAVENOUS | Status: DC
  Administered 2016-03-21 (×2): via INTRAVENOUS

## 2016-03-21 MED ORDER — DOCUSATE SODIUM 100 MG PO CAPS
100.0000 mg | ORAL_CAPSULE | Freq: Two times a day (BID) | ORAL | Status: DC
  Administered 2016-03-21 – 2016-03-22 (×2): 100 mg via ORAL
  Filled 2016-03-21 (×2): qty 1

## 2016-03-21 MED ORDER — PROMETHAZINE HCL 25 MG RE SUPP: 25.0000 mg | Freq: Three times a day (TID) | RECTAL | Status: DC | PRN

## 2016-03-21 MED ORDER — MIDAZOLAM HCL 2 MG/2ML IJ SOLN
INTRAMUSCULAR | Status: AC | PRN
  Administered 2016-03-21: 1 mg via INTRAVENOUS
  Administered 2016-03-21 (×2): 0.5 mg via INTRAVENOUS
  Administered 2016-03-21 (×3): 1 mg via INTRAVENOUS

## 2016-03-21 MED ORDER — MIDAZOLAM HCL 2 MG/2ML IJ SOLN
INTRAMUSCULAR | Status: AC
  Filled 2016-03-21: qty 6

## 2016-03-21 MED ORDER — IOPAMIDOL (ISOVUE-300) INJECTION 61%
INTRAVENOUS | Status: AC
  Administered 2016-03-21: 10 mL via INTRA_ARTERIAL
  Filled 2016-03-21: qty 50

## 2016-03-21 MED ORDER — LIDOCAINE HCL 1 % IJ SOLN
INTRAMUSCULAR | Status: AC
  Filled 2016-03-21: qty 20

## 2016-03-21 MED ORDER — ONDANSETRON HCL 4 MG/2ML IJ SOLN: 4.0000 mg | Freq: Four times a day (QID) | INTRAMUSCULAR | Status: DC | PRN

## 2016-03-21 MED ORDER — SODIUM CHLORIDE 0.9% FLUSH: 9.0000 mL | INTRAVENOUS | Status: DC | PRN

## 2016-03-21 MED ORDER — SODIUM CHLORIDE 0.9% FLUSH
3.0000 mL | Freq: Two times a day (BID) | INTRAVENOUS | Status: DC
  Administered 2016-03-22: 3 mL via INTRAVENOUS

## 2016-03-21 MED ORDER — FENTANYL CITRATE (PF) 100 MCG/2ML IJ SOLN
INTRAMUSCULAR | Status: AC | PRN
  Administered 2016-03-21 (×3): 50 ug via INTRAVENOUS

## 2016-03-21 MED ORDER — NALOXONE HCL 0.4 MG/ML IJ SOLN: 0.4000 mg | INTRAMUSCULAR | Status: DC | PRN

## 2016-03-21 MED ORDER — HYDROCODONE-ACETAMINOPHEN 5-325 MG PO TABS
1.0000 | ORAL_TABLET | ORAL | Status: DC | PRN
Start: 2016-03-21 — End: 2016-03-22
  Administered 2016-03-22: 1 via ORAL
  Filled 2016-03-21: qty 1

## 2016-03-21 MED ORDER — DIPHENHYDRAMINE HCL 12.5 MG/5ML PO ELIX
12.5000 mg | ORAL_SOLUTION | Freq: Four times a day (QID) | ORAL | Status: DC | PRN
  Filled 2016-03-21: qty 5

## 2016-03-21 MED ORDER — MECLIZINE HCL 25 MG PO TABS: 25.0000 mg | ORAL_TABLET | Freq: Four times a day (QID) | ORAL | Status: DC | PRN

## 2016-03-21 MED ORDER — ALBUTEROL SULFATE (2.5 MG/3ML) 0.083% IN NEBU: 2.5000 mg | INHALATION_SOLUTION | Freq: Four times a day (QID) | RESPIRATORY_TRACT | Status: DC | PRN

## 2016-03-21 MED ORDER — PANTOPRAZOLE SODIUM 40 MG PO TBEC
40.0000 mg | DELAYED_RELEASE_TABLET | Freq: Every day | ORAL | Status: DC
Start: 2016-03-22 — End: 2016-03-22
  Administered 2016-03-22: 40 mg via ORAL
  Filled 2016-03-21 (×2): qty 1

## 2016-03-21 MED ORDER — CEFAZOLIN SODIUM-DEXTROSE 2-4 GM/100ML-% IV SOLN
2.0000 g | INTRAVENOUS | Status: AC
  Administered 2016-03-21: 2 g via INTRAVENOUS
  Filled 2016-03-21: qty 100

## 2016-03-21 MED ORDER — ONDANSETRON HCL 4 MG/2ML IJ SOLN: 4.0000 mg | INTRAMUSCULAR | Status: DC

## 2016-03-21 MED ORDER — KETOROLAC TROMETHAMINE 30 MG/ML IJ SOLN
30.0000 mg | INTRAMUSCULAR | Status: AC
  Administered 2016-03-21: 30 mg via INTRAVENOUS
  Filled 2016-03-21: qty 1

## 2016-03-21 MED ORDER — DIPHENHYDRAMINE HCL 12.5 MG/5ML PO ELIX: 12.5000 mg | ORAL_SOLUTION | Freq: Four times a day (QID) | ORAL | Status: DC | PRN

## 2016-03-21 MED ORDER — SODIUM CHLORIDE 0.9 % IV SOLN: 250.0000 mL | INTRAVENOUS | Status: DC | PRN

## 2016-03-21 MED ORDER — IBUPROFEN 800 MG PO TABS
800.0000 mg | ORAL_TABLET | Freq: Four times a day (QID) | ORAL | Status: DC
  Administered 2016-03-21 – 2016-03-22 (×3): 800 mg via ORAL
  Filled 2016-03-21 (×3): qty 1

## 2016-03-21 MED ORDER — HYDROMORPHONE 1 MG/ML IV SOLN: INTRAVENOUS | Status: DC

## 2016-03-21 MED ORDER — FENTANYL CITRATE (PF) 100 MCG/2ML IJ SOLN
INTRAMUSCULAR | Status: DC
Start: 2016-03-21 — End: 2016-03-22
  Filled 2016-03-21: qty 6

## 2016-03-21 MED ORDER — AMPHETAMINE-DEXTROAMPHETAMINE 10 MG PO TABS: 45.0000 mg | ORAL_TABLET | Freq: Every day | ORAL | Status: DC

## 2016-03-21 MED ORDER — HYDROMORPHONE 1 MG/ML IV SOLN
INTRAVENOUS | Status: DC
  Administered 2016-03-21: 1.5 mg via INTRAVENOUS
  Administered 2016-03-21: 0.6 mg via INTRAVENOUS
  Filled 2016-03-21: qty 25

## 2016-03-21 MED ORDER — ZOLPIDEM TARTRATE 5 MG PO TABS
5.0000 mg | ORAL_TABLET | Freq: Every evening | ORAL | Status: DC | PRN
Start: 2016-03-21 — End: 2016-03-22

## 2016-03-21 MED ORDER — FLUTICASONE PROPIONATE 50 MCG/ACT NA SUSP
2.0000 | Freq: Every day | NASAL | Status: DC
  Filled 2016-03-21: qty 16

## 2016-03-21 MED ORDER — LIDOCAINE HCL 1 % IJ SOLN
INTRAMUSCULAR | Status: AC | PRN
  Administered 2016-03-21: 10 mL via INTRADERMAL

## 2016-03-21 MED ORDER — IOPAMIDOL (ISOVUE-370) INJECTION 76%
INTRAVENOUS | Status: AC
  Filled 2016-03-21: qty 100

## 2016-03-21 MED ORDER — DIPHENHYDRAMINE HCL 50 MG/ML IJ SOLN: 12.5000 mg | Freq: Four times a day (QID) | INTRAMUSCULAR | Status: DC | PRN

## 2016-03-21 MED ORDER — FLUOXETINE HCL 20 MG PO CAPS
40.0000 mg | ORAL_CAPSULE | Freq: Two times a day (BID) | ORAL | Status: DC
  Administered 2016-03-21 – 2016-03-22 (×2): 40 mg via ORAL
  Filled 2016-03-21 (×2): qty 2

## 2016-03-21 MED ORDER — IOPAMIDOL (ISOVUE-300) INJECTION 61%
50.0000 mL | Freq: Once | INTRAVENOUS | Status: AC | PRN
  Administered 2016-03-21: 10 mL via INTRA_ARTERIAL

## 2016-03-21 MED ORDER — PROMETHAZINE HCL 25 MG PO TABS
25.0000 mg | ORAL_TABLET | Freq: Three times a day (TID) | ORAL | Status: DC | PRN
  Administered 2016-03-21: 25 mg via ORAL
  Filled 2016-03-21: qty 1

## 2016-03-21 MED ORDER — CLONAZEPAM 0.5 MG PO TABS: 0.5000 mg | ORAL_TABLET | Freq: Three times a day (TID) | ORAL | Status: DC | PRN

## 2016-03-21 MED ORDER — SODIUM CHLORIDE 0.9% FLUSH: 3.0000 mL | INTRAVENOUS | Status: DC | PRN

## 2016-03-21 MED ORDER — IOPAMIDOL (ISOVUE-300) INJECTION 61%
100.0000 mL | Freq: Once | INTRAVENOUS | Status: AC | PRN
  Administered 2016-03-21: 60 mL via INTRA_ARTERIAL

## 2016-03-21 MED ORDER — SODIUM CHLORIDE 0.9 % IV SOLN
8.0000 mg | Freq: Once | INTRAVENOUS | Status: AC
  Administered 2016-03-21: 8 mg via INTRAVENOUS
  Filled 2016-03-21: qty 4

## 2016-03-21 NOTE — H&P (Signed)
Chief Complaint: fibroids  Referring Physician:Dr. Linda Hedges  Supervising Physician: Arne Cleveland  Patient Status: [facility] - Out-pt  HPI: [name] is an 55 y.o. female who saw Dr. Vernard Gambles a couple of weeks ago in the clinic concerning uterine fibroids.  She was diagnosed with fibroids 16 years ago.  She states she is menopausal and last had a cycle about a year and a half ago.  She is having pelvic pain, back pain, nausea, and urinary issues secondary to the size of her fibroids.  The patient has been referred to discuss uterine artery embolization to assist with this problem.  She presents today for this procedure.   Past Medical History:  Past Medical History:  Diagnosis Date  . Anxiety   . Arthritis   . Asthma    stress induced asthma  . Chlamydia   . Depression   . Fibroid   . GERD (gastroesophageal reflux disease)   . Hyperlipidemia   . IBS (irritable bowel syndrome)   . Obesity   . Trichomonas   . Tubular adenoma of colon 01/2012    Past Surgical History:  Past Surgical History:  Procedure Laterality Date  . COLPOSCOPY  1990   results were normal  . labial cyst removal      Family History:  Family History  Problem Relation Age of Onset  . Diabetes Father   . Diabetes Mother   . Hypertension Mother   . Deep vein thrombosis Mother   . Cancer - Other Mother     peritoneal cancer  . Colon cancer Maternal Grandmother   . Colon polyps Maternal Grandmother   . Rectal cancer Neg Hx   . Stomach cancer Neg Hx   . Pancreatic cancer Neg Hx   . Kidney disease Neg Hx   . Liver disease Neg Hx     Social History:  reports that she has never smoked. She has never used smokeless tobacco. She reports that she drinks alcohol. She reports that she does not use drugs.  Allergies: Not on File  Medications: Medications reviewed in epic  Please HPI for pertinent positives, otherwise complete 10 system ROS negative.  Mallampati Score: MD  Evaluation Airway: WNL Heart: WNL Abdomen: WNL Chest/ Lungs: WNL ASA  Classification: 2 Mallampati/Airway Score: Two  Physical Exam: BP (!) 137/91 (BP Location: Right Arm)   Pulse (!) 109   Temp 97.9 F (36.6 C) (Oral)   Resp 16   LMP 06/23/2014   SpO2 97%  There is no height or weight on file to calculate BMI. General: pleasant, obese black female who is laying in bed in NAD HEENT: head is normocephalic, atraumatic.  Sclera are noninjected.  PERRL.  Ears and nose without any masses or lesions.  Mouth is pink and moist Heart: regular rhythm, but slightly tachycardic.  Normal s1,s2. No obvious murmurs, gallops, or rubs noted.  Palpable radial and pedal pulses bilaterally Lungs: CTAB, no wheezes, rhonchi, or rales noted.  Respiratory effort nonlabored Abd: soft, NT, ND, +BS, no masses, hernias, or organomegaly Psych: A&Ox3 with an appropriate affect.   Labs: Pending  Imaging: No results found.  Assessment/Plan 1. Pelvic pain secondary to fibroids -we will plan to proceed today with [place]. -the patient is doing well and has no change in her medical history since she was recently seen in our clinic. -vitals reviewed -labs are pending -Risks and Benefits discussed with the patient including, but not limited to bleeding, infection, vascular injury or contrast induced  renal failure. All of the patient's questions were answered, patient is agreeable to proceed. Consent signed and in chart.   Thank you for this interesting consult.  I greatly enjoyed meeting [name] and look forward to participating in their care.  A copy of this report was sent to the requesting provider on this date.  Electronically Signed: Henreitta Cea 03/21/2016, 12:10 PM   I spent a total of    25 Minutes in face to face in clinical consultation, greater than 50% of which was counseling/coordinating care for fibroids

## 2016-03-21 NOTE — Addendum Note (Signed)
Encounter addended by: Leanne Lovely, RN on: 03/21/2016  1:26 PM<BR>    Actions taken: Narrator Event Log accessed, Order list changed

## 2016-03-21 NOTE — Progress Notes (Signed)
Patient ID: [name], female   DOB: 10-02-1960, 55 y.o.   MRN: [id number]   Patient is s/p [place] today with Dr. Vernard Gambles.  Patient is resting comfortably at time of visit. She endorses mild discomfort; she is using her PCA.   Foley catheter remains in place with clear, yellow output.   Groin site is nontender, soft, no bleeding.   Advance diet as tolerated.   Foley removal later tonight. Plan for d/c home tomorrow if stable.   Brynda Greathouse, MS RD PA-C 03/21/16 4:37 PM

## 2016-03-21 NOTE — Procedures (Signed)
Uterine Fibroid Arterial embolization No complication No blood loss. See complete dictation in [facility] PACS. Will admit overnight for PCA, observation.  

## 2016-03-22 ENCOUNTER — Other Ambulatory Visit: Payer: Self-pay | Admitting: Radiology

## 2016-03-22 DIAGNOSIS — D259 Leiomyoma of uterus, unspecified: Secondary | ICD-10-CM

## 2016-03-22 MED ORDER — AMPHETAMINE-DEXTROAMPHETAMINE 5 MG PO TABS: 7.5000 mg | ORAL_TABLET | ORAL | Status: DC

## 2016-03-22 NOTE — Discharge Summary (Signed)
Patient ID: [name] MRN: [id number] DOB/AGE: [birth year] 55 y.o.  Admit date: 03/21/2016 Discharge date: 03/22/2016  Supervising Physician: Arne Cleveland  Patient Status: [facility] - In-pt  Admission Diagnoses: Symptomatic uterine fibroids  Discharge Diagnoses: Symptomatic uterine fibroids, status post technically successful bilateral uterine artery embolization on 03/21/16 Active Problems:   Uterine leiomyoma  Past Medical History:  Diagnosis Date  . Anxiety   . Arthritis   . Asthma    stress induced asthma  . Chlamydia   . Depression   . Fibroid   . GERD (gastroesophageal reflux disease)   . Hyperlipidemia   . IBS (irritable bowel syndrome)   . Obesity   . Trichomonas   . Tubular adenoma of colon 01/2012   Past Surgical History:  Procedure Laterality Date  . colonscopy  2017  . COLPOSCOPY  1990   results were normal  . IR GENERIC HISTORICAL  03/21/2016   IR EMBO TUMOR ORGAN ISCHEMIA INFARCT INC GUIDE ROADMAPPING 03/21/2016 Arne Cleveland, MD [facility]-INTERV RAD  . IR GENERIC HISTORICAL  03/21/2016   IR US GUIDE VASC ACCESS RIGHT 03/21/2016 Arne Cleveland, MD [facility]  . IR GENERIC HISTORICAL  03/21/2016   IR ANGIOGRAM SELECTIVE EACH ADDITIONAL VESSEL 03/21/2016 Arne Cleveland, MD [facility]  . IR GENERIC HISTORICAL  03/21/2016   IR ANGIOGRAM SELECTIVE EACH ADDITIONAL VESSEL 03/21/2016 Arne Cleveland, MD [facility]  . IR GENERIC HISTORICAL  03/21/2016   IR ANGIOGRAM PELVIS SELECTIVE OR SUPRASELECTIVE 03/21/2016 Arne Cleveland, MD [facility]  . IR GENERIC HISTORICAL  03/21/2016   IR ANGIOGRAM PELVIS SELECTIVE OR SUPRASELECTIVE 03/21/2016 Arne Cleveland, MD [facility]  . labial cyst removal    . TUBAL LIGATION     20 years ago     Discharged Condition: good  Hospital Course: [name] is a 55 year old female with history of symptomatic uterine fibroids who was seen in consultation by Dr. Vernard Gambles on 02/27/16 to discuss treatment options.  She was deemed an appropriate candidate for bilateral uterine artery embolization. On 03/21/16 she underwent technically successful bilateral uterine artery embolization via IV conscious sedation. The procedure was performed without immediate complications and she was admitted for overnight observation for pain control. She was placed on a Dilaudid PCA pump. Overnight she did fairly well with exception of some mild intermittent pelvic cramping and occasional nausea. She was given antiemetics as needed. On the day of discharge she was stable. She was able to ambulate, void and tolerate her diet without significant difficulty. She was deemed stable for discharge at this time. She was given prescriptions for Norco 5/325, #30, no refills, 1-2 tablets every 4-6 hours as needed for moderate to severe pain, Zofran 8 mg, #20, no refills, 1 tablet twice daily as needed for nausea. Ibuprofen 600 mg, #20, no refills, 1 tablet every 6 hours for the next 5 days. Colace 100 mg, #20, no refills, 1 tablet twice daily as needed for constipation. She will resume her current home medications. She will be scheduled for follow-up with Dr. Vernard Gambles in the IR clinic in the next 3-4 weeks. She will continue current gynecologic care with Dr. Lynnette Caffey as scheduled. She was told to contact IR service with any additional questions or concerns.  Consults: none Significant Diagnostic Studies:  Results for orders placed or performed during the hospital encounter of [id number]  Basic metabolic panel  Result Value Ref Range   Sodium 137 135 - 145 mmol/L   Potassium 3.8 3.5 - 5.1 mmol/L   Chloride 103  101 - 111 mmol/L   CO2 24 22 - 32 mmol/L   Glucose, Bld 104 (H) 65 - 99 mg/dL   BUN 12 6 - 20 mg/dL   Creatinine, Ser 0.67 0.44 - 1.00 mg/dL   Calcium 9.3 8.9 - 10.3 mg/dL   GFR calc non Af Amer >60 >60 mL/min   GFR calc Af Amer >60 >60 mL/min   Anion gap 10 5 - 15  CBC with Differential/Platelet  Result Value Ref Range   WBC 4.5 4.0 -  10.5 K/uL   RBC 5.04 3.87 - 5.11 MIL/uL   Hemoglobin 12.9 12.0 - 15.0 g/dL   HCT 38.4 36.0 - 46.0 %   MCV 76.2 (L) 78.0 - 100.0 fL   MCH 25.6 (L) 26.0 - 34.0 pg   MCHC 33.6 30.0 - 36.0 g/dL   RDW 14.1 11.5 - 15.5 %   Platelets 254 150 - 400 K/uL   Neutrophils Relative % 44 %   Neutro Abs 2.0 1.7 - 7.7 K/uL   Lymphocytes Relative 48 %   Lymphs Abs 2.2 0.7 - 4.0 K/uL   Monocytes Relative 6 %   Monocytes Absolute 0.3 0.1 - 1.0 K/uL   Eosinophils Relative 2 %   Eosinophils Absolute 0.1 0.0 - 0.7 K/uL   Basophils Relative 0 %   Basophils Absolute 0.0 0.0 - 0.1 K/uL  hCG, serum, qualitative  Result Value Ref Range   Preg, Serum NEGATIVE NEGATIVE  Protime-INR  Result Value Ref Range   Prothrombin Time 12.9 11.4 - 15.2 seconds   INR 0.97      Treatments: Technically successful bilateral uterine artery embolization on 03/21/16 via IV conscious sedation  Discharge Exam: Blood pressure (!) 103/59, pulse 75, temperature 98 F (36.7 C), temperature source Oral, resp. rate 18, height 5\' 8"  (1.727 m), weight 215 lb (97.5 kg), last menstrual period 06/23/2014, SpO2 100 %. Patient awake, alert. Chest clear to auscultation bilaterally. Heart with regular rate and rhythm. Abdomen soft, obese, positive bowel sounds, currently nontender. Puncture site right groin soft, no hematoma, nontender; lower extremities with intact distal pulses, no significant edema.  Disposition: home  Discharge Instructions    Call MD for:  difficulty breathing, headache or visual disturbances    Complete by:  As directed    Call MD for:  extreme fatigue    Complete by:  As directed    Call MD for:  hives    Complete by:  As directed    Call MD for:  persistant dizziness or light-headedness    Complete by:  As directed    Call MD for:  persistant nausea and vomiting    Complete by:  As directed    Call MD for:  redness, tenderness, or signs of infection (pain, swelling, redness, odor or green/yellow discharge  around incision site)    Complete by:  As directed    Call MD for:  severe uncontrolled pain    Complete by:  As directed    Call MD for:  temperature >100.4    Complete by:  As directed    Change dressing (specify)    Complete by:  As directed    May change bandage over right groin puncture site daily and apply new Band-Aid to site for the next 2-3 days. May wash site with soap and water.   Diet - low sodium heart healthy    Complete by:  As directed    Driving Restrictions    Complete by:  As  directed    No driving for 48 hours   Increase activity slowly    Complete by:  As directed    Lifting restrictions    Complete by:  As directed    No heavy lifting for 3-4 days   May shower / Bathe    Complete by:  As directed    May walk up steps    Complete by:  As directed    Sexual Activity Restrictions    Complete by:  As directed    No sexual intercourse for 1 week     Allergies as of 03/22/2016   No Known Allergies     Medication List    TAKE these medications   albuterol 108 (90 Base) MCG/ACT inhaler Commonly known as:  PROVENTIL HFA;VENTOLIN HFA Inhale 2 puffs into the lungs every 6 (six) hours as needed for wheezing.   amphetamine-dextroamphetamine 30 MG tablet Commonly known as:  ADDERALL Take 7.5 mg by mouth daily as needed (adhd).   aspirin 81 MG tablet Take 81 mg by mouth daily.   b complex vitamins tablet Take 1 tablet by mouth daily.   Black Cohosh 40 MG Caps Take 40 mg by mouth daily.   clonazePAM 0.5 MG tablet Commonly known as:  KLONOPIN Take 0.5 mg by mouth 3 (three) times daily as needed. For anxiety.   FLUoxetine 40 MG capsule Commonly known as:  PROZAC Take 40 mg by mouth 2 (two) times daily.   fluticasone 50 MCG/ACT nasal spray Commonly known as:  FLONASE Place 2 sprays into both nostrils daily.   meclizine 25 MG tablet Commonly known as:  ANTIVERT Take 25 mg by mouth 4 (four) times daily as needed for dizziness.   Melatonin 5 MG  Caps Take 1 capsule by mouth at bedtime as needed (sleep).   MIDOL 200 MG Caps Generic drug:  Ibuprofen Take 2 capsules by mouth daily as needed. For pain   multivitamin with minerals tablet Take 1 tablet by mouth daily.   omeprazole 40 MG capsule Commonly known as:  PRILOSEC TAKE 1 CAPSULE (40 MG TOTAL) BY MOUTH 2 (TWO) TIMES DAILY.   ondansetron 4 MG disintegrating tablet Commonly known as:  ZOFRAN ODT Take 1 tablet (4 mg total) by mouth every 8 (eight) hours as needed for nausea or vomiting.   polyethylene glycol packet Commonly known as:  MIRALAX / GLYCOLAX Take 17 g by mouth daily.   sucralfate 1 g tablet Commonly known as:  CARAFATE Take 1 tablet (1 g total) by mouth 4 (four) times daily -  with meals and at bedtime.   VITAMIN D-3 PO Take 1 capsule by mouth daily.      Follow-up Information    HASSELL III, DAYNE DANIEL, MD Follow up.   Specialty:  Interventional Radiology Why:  Radiology will call you with follow up appointment with Dr. Vernard Gambles in the IR clinic in the next 3-4 weeks. Call [phone] or [phone] with any questions. Contact information: [address] [place] [address] [phone]        MORRIS, Whitfield, DO Follow up.   Specialty:  Obstetrics and Gynecology Why:  Continue follow-up with Dr. Lynnette Caffey as scheduled Contact information: [address] [place], [place] [address] [phone]            Electronically Signed: D. Rowe Robert 03/22/2016, 9:56 AM   I have spent less than 30 minutes discharging [name].

## 2016-03-22 NOTE — Discharge Instructions (Signed)
Uterine Artery Embolization for Fibroids, Care After Refer to this sheet in the next few weeks. These instructions provide you with information on caring for yourself after your procedure. Your health care provider may also give you more specific instructions. Your treatment has been planned according to current medical practices, but problems sometimes occur. Call your health care provider if you have any problems or questions after your procedure. WHAT TO EXPECT AFTER THE PROCEDURE After your procedure, it is typical to have cramping in the pelvis. You will be given pain medicine to control it. HOME CARE INSTRUCTIONS  Only take over-the-counter or prescription medicines for pain, discomfort, or fever as directed by your health care provider.  Do not take aspirin. It can cause bleeding.  Follow your health care provider's advice regarding medicines given to you, diet, activity, and when to begin sexual activity.  See your health care provider for follow-up care as directed. SEEK MEDICAL CARE IF:  You have a fever.  You have redness, swelling, and pain around your incision site.  You have pus draining from your incision.  You have a rash. SEEK IMMEDIATE MEDICAL CARE IF:  You have bleeding from your incision site.  You have difficulty breathing.  You have chest pain.  You have severe abdominal pain.  You have leg pain.  You become dizzy and faint. This information is not intended to replace advice given to you by your health care provider. Make sure you discuss any questions you have with your health care provider. Document Released: 12/29/2012 Document Reviewed: 12/29/2012 Elsevier Interactive Patient Education  2017 [employer].

## 2016-03-22 NOTE — Care Management Obs Status (Signed)
[place] NOTIFICATION   Patient Details  Name: [name] MRN: [id number] Date of Birth: Jun 01, 1960   Medicare Observation Status Notification Given:  Yes    Erenest Rasher, RN 03/22/2016, 12:13 PM

## 2016-03-22 NOTE — Progress Notes (Signed)
This shift pt's groin site remains soft and non tender with no bleeding. Strong pedal pulses. Pt has sat in chair for > 2 hours, then ambulated the hall approx 1000 feet with no complaints. Foley has been removed per MD order with an output of  475 within 3 hours. Will continue to monitor

## 2016-03-22 NOTE — Progress Notes (Signed)
Discussed with patient discharge instructions, she verbalized agreement and understanding.  Patient to go home with all belongings in private vehicle. 

## 2016-03-28 ENCOUNTER — Encounter: Payer: Self-pay | Admitting: Interventional Radiology

## 2016-04-03 ENCOUNTER — Ambulatory Visit
Admission: RE | Admit: 2016-04-03 | Discharge: 2016-04-03 | Disposition: A | Discharge status: Home / Self Care | Payer: Medicare HMO | Source: Ambulatory Visit | Attending: Radiology | Admitting: Radiology

## 2016-04-03 DIAGNOSIS — D259 Leiomyoma of uterus, unspecified: Secondary | ICD-10-CM

## 2016-04-03 HISTORY — PX: IR GENERIC HISTORICAL: IMG1180011

## 2016-04-03 NOTE — Progress Notes (Signed)
Patient ID: [name], female   DOB: 02/26/1961, 56 y.o.   MRN: [id number]       Chief Complaint: Patient was seen in consultation today for  Chief Complaint  Patient presents with  . Follow-up    1  mo follow up [place]     at the request of Allred,Darrell K  Referring Physician(s): Allred,Darrell K  History of Present Illness: [name] is a 56 y.o. female seen initially 02/27/2016  regarding her uterine fibroids. She was diagnosed with fibroids A 16 years ago. She described pelvic pain, back pain, urinary frequency and urgency, nocturia, stress incontinence, and nausea, change in stool habits, and other GI symptoms. She is G4 P2 post tubal ligation. She is perimenopausal/menopausal, her last perceived menstrual cycle 2 years ago. Was having no abnormal uterine bleeding. She's had no previous fibroid surgeries or therapies. Only gynecologic infections Chlamydia and vaginitis. Ultrasound examinations in September 2014 and January 2016 revealed multiple intramural fibroids. She underwent technically successful uterine fibroid embolization 03/21/2016.  Her post procedure course was marked by a moderate amount of pelvic discomfort, fairly well controlled with the prescribed ibuprofen and Norco. She is still having some pelvic and back pain similar to her initial presenting symptoms, and requested refill of her Norco. No uterine bleeding or tissue passage. No fever. Back to full activity without restriction.  Past Medical History:  Diagnosis Date  . Anxiety   . Arthritis   . Asthma    stress induced asthma  . Chlamydia   . Depression   . Fibroid   . GERD (gastroesophageal reflux disease)   . Hyperlipidemia   . IBS (irritable bowel syndrome)   . Obesity   . Trichomonas   . Tubular adenoma of colon 01/2012    Past Surgical History:  Procedure Laterality Date  . colonscopy  2017  . COLPOSCOPY  1990   results were normal  . IR GENERIC HISTORICAL  03/21/2016   IR EMBO  TUMOR ORGAN ISCHEMIA INFARCT INC GUIDE ROADMAPPING 03/21/2016 Arne Cleveland, MD [facility]  . IR GENERIC HISTORICAL  03/21/2016   IR US GUIDE VASC ACCESS RIGHT 03/21/2016 Arne Cleveland, MD [facility]  . IR GENERIC HISTORICAL  03/21/2016   IR ANGIOGRAM SELECTIVE EACH ADDITIONAL VESSEL 03/21/2016 Arne Cleveland, MD [facility]  . IR GENERIC HISTORICAL  03/21/2016   IR ANGIOGRAM SELECTIVE EACH ADDITIONAL VESSEL 03/21/2016 Arne Cleveland, MD [facility]  . IR GENERIC HISTORICAL  03/21/2016   IR ANGIOGRAM PELVIS SELECTIVE OR SUPRASELECTIVE 03/21/2016 Arne Cleveland, MD [facility]  . IR GENERIC HISTORICAL  03/21/2016   IR ANGIOGRAM PELVIS SELECTIVE OR SUPRASELECTIVE 03/21/2016 Arne Cleveland, MD [facility]  . IR GENERIC HISTORICAL  02/27/2016   IR RADIOLOGIST EVAL & MGMT 02/27/2016 Arne Cleveland, MD [facility]  . IR GENERIC HISTORICAL  04/03/2016   IR RADIOLOGIST EVAL & MGMT 04/03/2016 Arne Cleveland, MD [facility]  . labial cyst removal    . TUBAL LIGATION     20 years ago    Allergies: Patient has no known allergies.  Medications: Prior to Admission medications   Medication Sig Start Date End Date Taking? Authorizing Provider  albuterol (PROVENTIL HFA;VENTOLIN HFA) 108 (90 BASE) MCG/ACT inhaler Inhale 2 puffs into the lungs every 6 (six) hours as needed for wheezing.     Historical Provider, MD  amphetamine-dextroamphetamine (ADDERALL) 30 MG tablet Take 7.5-15 mg by mouth 2 (two) times daily. RX is for 30 mg tab but pt takes 1/4  to 1/2 tab BID instead all together 06/19/15   Historical Provider, MD  aspirin 81 MG tablet Take 81 mg by mouth daily.      Historical Provider, MD  b complex vitamins tablet Take 1 tablet by mouth daily.    Historical Provider, MD  Black Cohosh 40 MG CAPS Take 40 mg by mouth daily.    Historical Provider, MD  Cholecalciferol (VITAMIN D-3 PO) Take 1 capsule by mouth daily.    Historical Provider, MD  clonazePAM (KLONOPIN) 0.5 MG tablet  Take 0.5 mg by mouth 3 (three) times daily as needed. For anxiety.    Historical Provider, MD  FLUoxetine (PROZAC) 40 MG capsule Take 40 mg by mouth 2 (two) times daily.  06/19/15   Historical Provider, MD  fluticasone (FLONASE) 50 MCG/ACT nasal spray Place 2 sprays into both nostrils daily.    Historical Provider, MD  Ibuprofen (MIDOL) 200 MG CAPS Take 2 capsules by mouth daily as needed. For pain    Historical Provider, MD  meclizine (ANTIVERT) 25 MG tablet Take 25 mg by mouth 4 (four) times daily as needed for dizziness.    Historical Provider, MD  Melatonin 5 MG CAPS Take 1 capsule by mouth at bedtime as needed (sleep).  11/27/15   Historical Provider, MD  Multiple Vitamins-Minerals (MULTIVITAMIN WITH MINERALS) tablet Take 1 tablet by mouth daily.    Historical Provider, MD  omeprazole (PRILOSEC) 40 MG capsule TAKE 1 CAPSULE (40 MG TOTAL) BY MOUTH 2 (TWO) TIMES DAILY. 02/05/16   Ladene Artist, MD  ondansetron (ZOFRAN ODT) 4 MG disintegrating tablet Take 1 tablet (4 mg total) by mouth every 8 (eight) hours as needed for nausea or vomiting. 09/29/15   Samantha Tripp Dowless, PA-C  polyethylene glycol (MIRALAX / GLYCOLAX) packet Take 17 g by mouth daily.    Historical Provider, MD  sucralfate (CARAFATE) 1 g tablet Take 1 tablet (1 g total) by mouth 4 (four) times daily -  with meals and at bedtime. 12/21/15 12/26/15  Voncille Lo, MD     Family History  Problem Relation Age of Onset  . Diabetes Father   . Diabetes Mother   . Hypertension Mother   . Deep vein thrombosis Mother   . Cancer - Other Mother     peritoneal cancer  . Colon cancer Maternal Grandmother   . Colon polyps Maternal Grandmother   . Rectal cancer Neg Hx   . Stomach cancer Neg Hx   . Pancreatic cancer Neg Hx   . Kidney disease Neg Hx   . Liver disease Neg Hx     Social History   Social History  . Marital status: Single    Spouse name: N/A  . Number of children: 2  . Years of education: N/A   Social History Main  Topics  . Smoking status: Never Smoker  . Smokeless tobacco: Never Used  . Alcohol use 0.0 oz/week     Comment: occ, not weekly  . Drug use: No  . Sexual activity: Not Asked   Other Topics Concern  . None   Social History Narrative  . None    ECOG Status: 1 - Symptomatic but completely ambulatory  Review of Systems: A 12 point ROS discussed and pertinent positives are indicated in the HPI above.  All other systems are negative.  Review of Systems  Vital Signs: BP (!) 141/81 (BP Location: Left Arm, Patient Position: Sitting, Cuff Size: Normal)   Pulse (!) 101   Temp 98.1 F (  36.7 C) (Oral)   Resp 14   Ht 5\' 6"  (1.676 m)   Wt 215 lb (97.5 kg)   LMP 06/23/2014   SpO2 98%   BMI 34.70 kg/m   Physical Exam  Mallampati Score:     Imaging: Mr Pelvis W Wo Contrast  Result Date: 03/07/2016 CLINICAL DATA:  Chronic pelvic pain for 2 years. Fibroids. Preprocedure evaluation for uterine artery embolization. EXAM: MRI PELVIS WITHOUT AND WITH CONTRAST TECHNIQUE: Multiplanar multisequence MR imaging of the pelvis was performed both before and after administration of intravenous contrast. CONTRAST:  31mL MULTIHANCE GADOBENATE DIMEGLUMINE 529 MG/ML IV SOLN COMPARISON:  None. FINDINGS: Urinary Tract: No bladder abnormality identified. Bowel:  Unremarkable visualized pelvic bowel loops. Vascular/Lymphatic: No pathologically enlarged lymph nodes or other significant abnormality. Reproductive: Uterus: Measures 7.8 x 5.8 x 5.5 cm (volume = 130 cm^3). Dominant intramural fibroid is seen in the posterior fundus which measures 3.6 cm. A smaller fibroid in the mid uterine fundus has a submucosal component and measures 1.8 cm. Two other smaller fibroids in the left anterior and posterior uterine corpus measure 1.6 and 1.3 cm respectively. No abnormal endometrial thickening seen. Cervix is normal in appearance. Intracavitary fibroids:  None. Pedunculated fibroids: None. Fibroid contrast enhancement:  The 1.8 cm mid uterine fundus fibroid shows peripheral contrast enhancement and central degeneration. All the other fibroids show diffuse contrast enhancement. Right ovary:  Appears normal.  No mass identified. Left ovary:  Appears normal.  No mass identified. Other:  None. Musculoskeletal:  Unremarkable. IMPRESSION: Several small uterine fibroids, largest measuring 3.6 cm. No intracavitary or pedunculated fibroids identified. Normal appearance of both ovaries.  No adnexal mass identified. Electronically Signed   By: Earle Gell M.D.   On: 03/07/2016 08:14   Ir Angiogram Pelvis Selective Or Supraselective  Result Date: 03/21/2016 CLINICAL DATA:  Multiple uterine fibroids with bulk symptoms. See previous consultation. FLUOROSCOPY TIME:  13 minutes 12 second (17 92 mGy) EXAM: EXAM BILATERAL UTERINE ARTERY EMBOLIZATION TECHNIQUE: The procedure, risks, benefits, and alternatives were explained to the patient. Questions regarding the procedure were encouraged and answered. The patient understands and consents to the procedure. As antibiotic prophylaxis, cefazolin 2 g was ordered pre-procedure and administered intravenously within one hour of incision.An appropriate skin entry site was determined under fluoroscopy. Skin site was marked, prepped with Betadine, and draped in usual sterile fashion, and infiltrated locally with 1% lidocaine. Intravenous Fentanyl and Versed were administered as conscious sedation during continuous monitoring of the patient's level of consciousness and physiological / cardiorespiratory status by the radiology RN, with a total moderate sedation time of 46 minutes. Under real-time ultrasound guidance, the right common femoral artery was accessed with a micropuncture needle set using singlewall technique in a single pass. Ultrasound imaging documentation was saved. Micropuncture dilator exchanged over a Benson wire for a 5 [place] vascular sheath through which a C2 catheter was used to  selectively catheterize the left internal iliac artery for pelvic arteriography. A coaxial Progreat catheter was advanced with the Terumo wire and used to selectively catheterize the left uterine artery. The microcatheter tip was positioned in the distal horizontal segment. Selective arteriogram confirms appropriate positioning. Distal branches of the left uterine artery were embolized with 500-700 micron Embospheres. Embolization continued until near stasis of flow was achieved. Microcatheter was withdrawn and a followup selective left internal iliac arteriogram was obtained. A Waltman loop was then formed with the C2 catheter, and the right internal iliac artery was selectively catheterized. Again the Progreat catheter with  Terumo guidewire was coaxially advanced and used to selectively catheterize the right uterine artery. Confirmatory arteriogram was performed. Right uterine artery branches were embolized with 500-700 micron Embospheres to near stasis of flow. Two vials of Embospheres were utilized for the case. Microcatheter was withdrawn and a followup arteriogram of the right internal iliac artery was performed. C2 catheter removed. After confirmatory femoral angiogram, sheath removed and using the Exoseal device hemostasis was achieved. Patient tolerated the procedure well. COMPLICATIONS: COMPLICATIONS none IMPRESSION: 1. Technically successful bilateral uterine artery embolization using 500-700 micron Embospheres. Electronically Signed   By: Lucrezia Europe M.D.   On: 03/21/2016 14:56   Ir Angiogram Pelvis Selective Or Supraselective  Result Date: 03/21/2016 CLINICAL DATA:  Multiple uterine fibroids with bulk symptoms. See previous consultation. FLUOROSCOPY TIME:  13 minutes 12 second (17 92 mGy) EXAM: EXAM BILATERAL UTERINE ARTERY EMBOLIZATION TECHNIQUE: The procedure, risks, benefits, and alternatives were explained to the patient. Questions regarding the procedure were encouraged and answered. The  patient understands and consents to the procedure. As antibiotic prophylaxis, cefazolin 2 g was ordered pre-procedure and administered intravenously within one hour of incision.An appropriate skin entry site was determined under fluoroscopy. Skin site was marked, prepped with Betadine, and draped in usual sterile fashion, and infiltrated locally with 1% lidocaine. Intravenous Fentanyl and Versed were administered as conscious sedation during continuous monitoring of the patient's level of consciousness and physiological / cardiorespiratory status by the radiology RN, with a total moderate sedation time of 46 minutes. Under real-time ultrasound guidance, the right common femoral artery was accessed with a micropuncture needle set using singlewall technique in a single pass. Ultrasound imaging documentation was saved. Micropuncture dilator exchanged over a Benson wire for a 5 [place] vascular sheath through which a C2 catheter was used to selectively catheterize the left internal iliac artery for pelvic arteriography. A coaxial Progreat catheter was advanced with the Terumo wire and used to selectively catheterize the left uterine artery. The microcatheter tip was positioned in the distal horizontal segment. Selective arteriogram confirms appropriate positioning. Distal branches of the left uterine artery were embolized with 500-700 micron Embospheres. Embolization continued until near stasis of flow was achieved. Microcatheter was withdrawn and a followup selective left internal iliac arteriogram was obtained. A Waltman loop was then formed with the C2 catheter, and the right internal iliac artery was selectively catheterized. Again the Progreat catheter with Terumo guidewire was coaxially advanced and used to selectively catheterize the right uterine artery. Confirmatory arteriogram was performed. Right uterine artery branches were embolized with 500-700 micron Embospheres to near stasis of flow. Two vials of  Embospheres were utilized for the case. Microcatheter was withdrawn and a followup arteriogram of the right internal iliac artery was performed. C2 catheter removed. After confirmatory femoral angiogram, sheath removed and using the Exoseal device hemostasis was achieved. Patient tolerated the procedure well. COMPLICATIONS: COMPLICATIONS none IMPRESSION: 1. Technically successful bilateral uterine artery embolization using 500-700 micron Embospheres. Electronically Signed   By: Lucrezia Europe M.D.   On: 03/21/2016 14:56   Ir Angiogram Selective Each Additional Vessel  Result Date: 03/21/2016 CLINICAL DATA:  Multiple uterine fibroids with bulk symptoms. See previous consultation. FLUOROSCOPY TIME:  13 minutes 12 second (17 92 mGy) EXAM: EXAM BILATERAL UTERINE ARTERY EMBOLIZATION TECHNIQUE: The procedure, risks, benefits, and alternatives were explained to the patient. Questions regarding the procedure were encouraged and answered. The patient understands and consents to the procedure. As antibiotic prophylaxis, cefazolin 2 g was ordered pre-procedure and administered intravenously within one  hour of incision.An appropriate skin entry site was determined under fluoroscopy. Skin site was marked, prepped with Betadine, and draped in usual sterile fashion, and infiltrated locally with 1% lidocaine. Intravenous Fentanyl and Versed were administered as conscious sedation during continuous monitoring of the patient's level of consciousness and physiological / cardiorespiratory status by the radiology RN, with a total moderate sedation time of 46 minutes. Under real-time ultrasound guidance, the right common femoral artery was accessed with a micropuncture needle set using singlewall technique in a single pass. Ultrasound imaging documentation was saved. Micropuncture dilator exchanged over a Benson wire for a 5 [place] vascular sheath through which a C2 catheter was used to selectively catheterize the left internal iliac  artery for pelvic arteriography. A coaxial Progreat catheter was advanced with the Terumo wire and used to selectively catheterize the left uterine artery. The microcatheter tip was positioned in the distal horizontal segment. Selective arteriogram confirms appropriate positioning. Distal branches of the left uterine artery were embolized with 500-700 micron Embospheres. Embolization continued until near stasis of flow was achieved. Microcatheter was withdrawn and a followup selective left internal iliac arteriogram was obtained. A Waltman loop was then formed with the C2 catheter, and the right internal iliac artery was selectively catheterized. Again the Progreat catheter with Terumo guidewire was coaxially advanced and used to selectively catheterize the right uterine artery. Confirmatory arteriogram was performed. Right uterine artery branches were embolized with 500-700 micron Embospheres to near stasis of flow. Two vials of Embospheres were utilized for the case. Microcatheter was withdrawn and a followup arteriogram of the right internal iliac artery was performed. C2 catheter removed. After confirmatory femoral angiogram, sheath removed and using the Exoseal device hemostasis was achieved. Patient tolerated the procedure well. COMPLICATIONS: COMPLICATIONS none IMPRESSION: 1. Technically successful bilateral uterine artery embolization using 500-700 micron Embospheres. Electronically Signed   By: Lucrezia Europe M.D.   On: 03/21/2016 14:56   Ir Angiogram Selective Each Additional Vessel  Result Date: 03/21/2016 CLINICAL DATA:  Multiple uterine fibroids with bulk symptoms. See previous consultation. FLUOROSCOPY TIME:  13 minutes 12 second (17 92 mGy) EXAM: EXAM BILATERAL UTERINE ARTERY EMBOLIZATION TECHNIQUE: The procedure, risks, benefits, and alternatives were explained to the patient. Questions regarding the procedure were encouraged and answered. The patient understands and consents to the procedure. As  antibiotic prophylaxis, cefazolin 2 g was ordered pre-procedure and administered intravenously within one hour of incision.An appropriate skin entry site was determined under fluoroscopy. Skin site was marked, prepped with Betadine, and draped in usual sterile fashion, and infiltrated locally with 1% lidocaine. Intravenous Fentanyl and Versed were administered as conscious sedation during continuous monitoring of the patient's level of consciousness and physiological / cardiorespiratory status by the radiology RN, with a total moderate sedation time of 46 minutes. Under real-time ultrasound guidance, the right common femoral artery was accessed with a micropuncture needle set using singlewall technique in a single pass. Ultrasound imaging documentation was saved. Micropuncture dilator exchanged over a Benson wire for a 5 [place] vascular sheath through which a C2 catheter was used to selectively catheterize the left internal iliac artery for pelvic arteriography. A coaxial Progreat catheter was advanced with the Terumo wire and used to selectively catheterize the left uterine artery. The microcatheter tip was positioned in the distal horizontal segment. Selective arteriogram confirms appropriate positioning. Distal branches of the left uterine artery were embolized with 500-700 micron Embospheres. Embolization continued until near stasis of flow was achieved. Microcatheter was withdrawn and a followup selective left internal  iliac arteriogram was obtained. A Waltman loop was then formed with the C2 catheter, and the right internal iliac artery was selectively catheterized. Again the Progreat catheter with Terumo guidewire was coaxially advanced and used to selectively catheterize the right uterine artery. Confirmatory arteriogram was performed. Right uterine artery branches were embolized with 500-700 micron Embospheres to near stasis of flow. Two vials of Embospheres were utilized for the case. Microcatheter was  withdrawn and a followup arteriogram of the right internal iliac artery was performed. C2 catheter removed. After confirmatory femoral angiogram, sheath removed and using the Exoseal device hemostasis was achieved. Patient tolerated the procedure well. COMPLICATIONS: COMPLICATIONS none IMPRESSION: 1. Technically successful bilateral uterine artery embolization using 500-700 micron Embospheres. Electronically Signed   By: Lucrezia Europe M.D.   On: 03/21/2016 14:56   Ir US Guide Vasc Access Right  Result Date: 03/21/2016 CLINICAL DATA:  Multiple uterine fibroids with bulk symptoms. See previous consultation. FLUOROSCOPY TIME:  13 minutes 12 second (17 92 mGy) EXAM: EXAM BILATERAL UTERINE ARTERY EMBOLIZATION TECHNIQUE: The procedure, risks, benefits, and alternatives were explained to the patient. Questions regarding the procedure were encouraged and answered. The patient understands and consents to the procedure. As antibiotic prophylaxis, cefazolin 2 g was ordered pre-procedure and administered intravenously within one hour of incision.An appropriate skin entry site was determined under fluoroscopy. Skin site was marked, prepped with Betadine, and draped in usual sterile fashion, and infiltrated locally with 1% lidocaine. Intravenous Fentanyl and Versed were administered as conscious sedation during continuous monitoring of the patient's level of consciousness and physiological / cardiorespiratory status by the radiology RN, with a total moderate sedation time of 46 minutes. Under real-time ultrasound guidance, the right common femoral artery was accessed with a micropuncture needle set using singlewall technique in a single pass. Ultrasound imaging documentation was saved. Micropuncture dilator exchanged over a Benson wire for a 5 [place] vascular sheath through which a C2 catheter was used to selectively catheterize the left internal iliac artery for pelvic arteriography. A coaxial Progreat catheter was advanced  with the Terumo wire and used to selectively catheterize the left uterine artery. The microcatheter tip was positioned in the distal horizontal segment. Selective arteriogram confirms appropriate positioning. Distal branches of the left uterine artery were embolized with 500-700 micron Embospheres. Embolization continued until near stasis of flow was achieved. Microcatheter was withdrawn and a followup selective left internal iliac arteriogram was obtained. A Waltman loop was then formed with the C2 catheter, and the right internal iliac artery was selectively catheterized. Again the Progreat catheter with Terumo guidewire was coaxially advanced and used to selectively catheterize the right uterine artery. Confirmatory arteriogram was performed. Right uterine artery branches were embolized with 500-700 micron Embospheres to near stasis of flow. Two vials of Embospheres were utilized for the case. Microcatheter was withdrawn and a followup arteriogram of the right internal iliac artery was performed. C2 catheter removed. After confirmatory femoral angiogram, sheath removed and using the Exoseal device hemostasis was achieved. Patient tolerated the procedure well. COMPLICATIONS: COMPLICATIONS none IMPRESSION: 1. Technically successful bilateral uterine artery embolization using 500-700 micron Embospheres. Electronically Signed   By: Lucrezia Europe M.D.   On: 03/21/2016 14:56   Ir Embo Tumor Organ Ischemia Infarct Inc Guide Roadmapping  Result Date: 03/21/2016 CLINICAL DATA:  Multiple uterine fibroids with bulk symptoms. See previous consultation. FLUOROSCOPY TIME:  13 minutes 12 second (17 92 mGy) EXAM: EXAM BILATERAL UTERINE ARTERY EMBOLIZATION TECHNIQUE: The procedure, risks, benefits, and alternatives were explained to the  patient. Questions regarding the procedure were encouraged and answered. The patient understands and consents to the procedure. As antibiotic prophylaxis, cefazolin 2 g was ordered pre-procedure  and administered intravenously within one hour of incision.An appropriate skin entry site was determined under fluoroscopy. Skin site was marked, prepped with Betadine, and draped in usual sterile fashion, and infiltrated locally with 1% lidocaine. Intravenous Fentanyl and Versed were administered as conscious sedation during continuous monitoring of the patient's level of consciousness and physiological / cardiorespiratory status by the radiology RN, with a total moderate sedation time of 46 minutes. Under real-time ultrasound guidance, the right common femoral artery was accessed with a micropuncture needle set using singlewall technique in a single pass. Ultrasound imaging documentation was saved. Micropuncture dilator exchanged over a Benson wire for a 5 [place] vascular sheath through which a C2 catheter was used to selectively catheterize the left internal iliac artery for pelvic arteriography. A coaxial Progreat catheter was advanced with the Terumo wire and used to selectively catheterize the left uterine artery. The microcatheter tip was positioned in the distal horizontal segment. Selective arteriogram confirms appropriate positioning. Distal branches of the left uterine artery were embolized with 500-700 micron Embospheres. Embolization continued until near stasis of flow was achieved. Microcatheter was withdrawn and a followup selective left internal iliac arteriogram was obtained. A Waltman loop was then formed with the C2 catheter, and the right internal iliac artery was selectively catheterized. Again the Progreat catheter with Terumo guidewire was coaxially advanced and used to selectively catheterize the right uterine artery. Confirmatory arteriogram was performed. Right uterine artery branches were embolized with 500-700 micron Embospheres to near stasis of flow. Two vials of Embospheres were utilized for the case. Microcatheter was withdrawn and a followup arteriogram of the right internal iliac  artery was performed. C2 catheter removed. After confirmatory femoral angiogram, sheath removed and using the Exoseal device hemostasis was achieved. Patient tolerated the procedure well. COMPLICATIONS: COMPLICATIONS none IMPRESSION: 1. Technically successful bilateral uterine artery embolization using 500-700 micron Embospheres. Electronically Signed   By: Lucrezia Europe M.D.   On: 03/21/2016 14:56   Ir Radiologist Eval & Mgmt  Result Date: 04/03/2016 Please refer to "Notes" to see consult details.   Labs:  CBC:  Recent Labs  05/09/15 1119 09/29/15 0607 12/21/15 1631 03/21/16 1200  WBC 4.8 7.5 6.7 4.5  HGB 13.1 12.5 12.2 12.9  HCT 39.1 38.5 38.5 38.4  PLT 223.0 191 220 254    COAGS:  Recent Labs  03/21/16 1200  INR 0.97    BMP:  Recent Labs  09/29/15 0607 12/21/15 1631 03/21/16 1200  NA 137 140 137  K 3.7 3.9 3.8  CL 105 105 103  CO2 22 27 24   GLUCOSE 123* 110* 104*  BUN 12 7 12   CALCIUM 9.6 9.5 9.3  CREATININE 0.93 0.96 0.67  GFRNONAA >60 >60 >60  GFRAA >60 >60 >60    LIVER FUNCTION TESTS:  Recent Labs  09/29/15 0607  BILITOT 0.3  AST 21  ALT 16  ALKPHOS 72  PROT 7.3  ALBUMIN 3.7    TUMOR MARKERS: No results for input(s): AFPTM, CEA, CA199, CHROMGRNA in the last 8760 hours.  Assessment and Plan:  My impression is that she has done well thus far post uterine fibroid embolization with fairly typical postembolization syndrome, moderately well controlled. We again reviewed the pathophysiology of uterine fibroids. We reviewed the time course of fibroid involution over 3-6 months with the expectation of improvement in bulk symptoms over that  time. I emphasize the need to contact me right away if she had any tissue passage. She probably won't have any uterine bleeding as she had not had a menstrual cycle for quite some time preprocedure. She seemed to understand. She had her questions answered. She is frustrated by the persistent pain. I don't think that her  residual symptoms require continued Norco; I gave her a prescription for naproxen 225 mg 1-2 by mouth twice a day when necessary pain.  We'll plan to get an MRI at 6 month mark, and see her back in clinic at that time. She knows to call with any interval questions or problems.  Thank you for this interesting consult.  I greatly enjoyed following up with  and look forward to participating in their care.  A copy of this report was sent to the requesting provider on this date.  Electronically Signed: Roxana Lai III, DAYNE Kihanna Kamiya 04/03/2016, 3:03 PM   I spent a total of    40 Minutes in face to face in clinical consultation, greater than 50% of which was counseling/coordinating care for  uterine fibroids.

## 2016-06-04 ENCOUNTER — Telehealth: Payer: Self-pay | Admitting: Radiology

## 2016-06-04 NOTE — Telephone Encounter (Signed)
Left message for 3 mo post [place] call for status update.  Requested that patient call back for questions or concerns.  6 mo follow up appointment to be scheduled for late June 2018.  Shaquala Broeker Riki Rusk, RN 06/04/2016 5:04 PM

## 2016-06-22 ENCOUNTER — Ambulatory Visit (HOSPITAL_COMMUNITY)
Admission: EM | Admit: 2016-06-22 | Discharge: 2016-06-22 | Disposition: A | Discharge status: Home / Self Care | Payer: Medicare HMO | Attending: Family Medicine | Admitting: Family Medicine

## 2016-06-22 ENCOUNTER — Encounter (HOSPITAL_COMMUNITY): Payer: Self-pay | Admitting: Emergency Medicine

## 2016-06-22 DIAGNOSIS — R05 Cough: Secondary | ICD-10-CM

## 2016-06-22 DIAGNOSIS — J208 Acute bronchitis due to other specified organisms: Secondary | ICD-10-CM | POA: Diagnosis not present

## 2016-06-22 DIAGNOSIS — R059 Cough, unspecified: Secondary | ICD-10-CM

## 2016-06-22 DIAGNOSIS — K5909 Other constipation: Secondary | ICD-10-CM

## 2016-06-22 MED ORDER — METHYLPREDNISOLONE 4 MG PO TBPK: ORAL_TABLET | ORAL | 0 refills | Status: DC

## 2016-06-22 MED ORDER — ALBUTEROL SULFATE (2.5 MG/3ML) 0.083% IN NEBU
2.5000 mg | INHALATION_SOLUTION | Freq: Once | RESPIRATORY_TRACT | Status: AC
  Administered 2016-06-22: 2.5 mg via RESPIRATORY_TRACT

## 2016-06-22 MED ORDER — ALBUTEROL SULFATE (2.5 MG/3ML) 0.083% IN NEBU
INHALATION_SOLUTION | RESPIRATORY_TRACT | Status: AC
  Filled 2016-06-22: qty 3

## 2016-06-22 MED ORDER — MAGNESIUM CITRATE PO SOLN: 1.0000 | Freq: Once | ORAL | 1 refills | Status: AC

## 2016-06-22 MED ORDER — ALBUTEROL SULFATE HFA 108 (90 BASE) MCG/ACT IN AERS: 2.0000 | INHALATION_SPRAY | Freq: Once | RESPIRATORY_TRACT | Status: DC

## 2016-06-22 MED ORDER — BENZONATATE 100 MG PO CAPS: 100.0000 mg | ORAL_CAPSULE | Freq: Three times a day (TID) | ORAL | 0 refills | Status: DC

## 2016-06-22 MED ORDER — AZITHROMYCIN 250 MG PO TABS
250.0000 mg | ORAL_TABLET | Freq: Every day | ORAL | 0 refills | Status: DC
Start: 2016-06-22 — End: 2016-09-19

## 2016-06-22 NOTE — ED Triage Notes (Signed)
The patient presented to the [facility] with a complaint of a cough, abdominal cramping and diarrhea.

## 2016-06-22 NOTE — ED Provider Notes (Signed)
CSN: [id number]     Arrival date & time 06/22/16  1454 History   First MD Initiated Contact with Patient 06/22/16 1659     Chief Complaint  Patient presents with  . Cough  . Abdominal Pain   (Consider location/radiation/quality/duration/timing/severity/associated sxs/prior Treatment) Patient c/o constipation, cough, and abdominal cramping.  She c/o wheezing and cough.   The history is provided by the patient.  Cough  Cough characteristics:  Productive Sputum characteristics:  White Severity:  Mild Onset quality:  Sudden Duration:  1 week Timing:  Constant Progression:  Worsening Chronicity:  New Smoker: no   Relieved by:  Nothing Worsened by:  Nothing Abdominal Pain  Associated symptoms: cough     Past Medical History:  Diagnosis Date  . Anxiety   . Arthritis   . Asthma    stress induced asthma  . Chlamydia   . Depression   . Fibroid   . GERD (gastroesophageal reflux disease)   . Hyperlipidemia   . IBS (irritable bowel syndrome)   . Obesity   . Trichomonas   . Tubular adenoma of colon 01/2012   Past Surgical History:  Procedure Laterality Date  . colonscopy  2017  . COLPOSCOPY  1990   results were normal  . IR GENERIC HISTORICAL  03/21/2016   IR EMBO TUMOR ORGAN ISCHEMIA INFARCT INC GUIDE ROADMAPPING 03/21/2016 Arne Cleveland, MD [facility]  . IR GENERIC HISTORICAL  03/21/2016   IR US GUIDE VASC ACCESS RIGHT 03/21/2016 Arne Cleveland, MD [facility]  . IR GENERIC HISTORICAL  03/21/2016   IR ANGIOGRAM SELECTIVE EACH ADDITIONAL VESSEL 03/21/2016 Arne Cleveland, MD [facility]  . IR GENERIC HISTORICAL  03/21/2016   IR ANGIOGRAM SELECTIVE EACH ADDITIONAL VESSEL 03/21/2016 Arne Cleveland, MD [facility]  . IR GENERIC HISTORICAL  03/21/2016   IR ANGIOGRAM PELVIS SELECTIVE OR SUPRASELECTIVE 03/21/2016 Arne Cleveland, MD [facility]  . IR GENERIC HISTORICAL  03/21/2016   IR ANGIOGRAM PELVIS SELECTIVE OR SUPRASELECTIVE 03/21/2016 Arne Cleveland, MD  [facility]  . IR GENERIC HISTORICAL  02/27/2016   IR RADIOLOGIST EVAL & MGMT 02/27/2016 Arne Cleveland, MD [facility]  . IR GENERIC HISTORICAL  04/03/2016   IR RADIOLOGIST EVAL & MGMT 04/03/2016 Arne Cleveland, MD [facility]  . labial cyst removal    . TUBAL LIGATION     20 years ago   Family History  Problem Relation Age of Onset  . Diabetes Father   . Diabetes Mother   . Hypertension Mother   . Deep vein thrombosis Mother   . Cancer - Other Mother     peritoneal cancer  . Colon cancer Maternal Grandmother   . Colon polyps Maternal Grandmother   . Rectal cancer Neg Hx   . Stomach cancer Neg Hx   . Pancreatic cancer Neg Hx   . Kidney disease Neg Hx   . Liver disease Neg Hx    Social History  Substance Use Topics  . Smoking status: Never Smoker  . Smokeless tobacco: Never Used  . Alcohol use 0.0 oz/week     Comment: occ, not weekly   OB History    Gravida Para Term Preterm AB Living   4 2 0 0 2 2   SAB TAB Ectopic Multiple Live Births   2 0           Review of Systems  Constitutional: Negative.   HENT: Negative.   Eyes: Negative.   Respiratory: Positive for cough.   Cardiovascular: Negative.  Gastrointestinal: Positive for abdominal pain.  Endocrine: Negative.   Genitourinary: Negative.   Musculoskeletal: Negative.   Allergic/Immunologic: Negative.   Neurological: Negative.     Allergies  Patient has no known allergies.  Home Medications   Prior to Admission medications   Medication Sig Start Date End Date Taking? Authorizing Provider  albuterol (PROVENTIL HFA;VENTOLIN HFA) 108 (90 BASE) MCG/ACT inhaler Inhale 2 puffs into the lungs every 6 (six) hours as needed for wheezing.     Historical Provider, MD  amphetamine-dextroamphetamine (ADDERALL) 30 MG tablet Take 7.5-15 mg by mouth 2 (two) times daily. RX is for 30 mg tab but pt takes 1/4 to 1/2 tab BID instead all together 06/19/15   Historical Provider, MD  aspirin 81 MG tablet Take 81 mg by  mouth daily.      Historical Provider, MD  azithromycin (ZITHROMAX) 250 MG tablet Take 1 tablet (250 mg total) by mouth daily. Take first 2 tablets together, then 1 every day until finished. 06/22/16   [name], FNP  b complex vitamins tablet Take 1 tablet by mouth daily.    Historical Provider, MD  benzonatate (TESSALON) 100 MG capsule Take 1 capsule (100 mg total) by mouth every 8 (eight) hours. 06/22/16   [name], FNP  Black Cohosh 40 MG CAPS Take 40 mg by mouth daily.    Historical Provider, MD  Cholecalciferol (VITAMIN D-3 PO) Take 1 capsule by mouth daily.    Historical Provider, MD  clonazePAM (KLONOPIN) 0.5 MG tablet Take 0.5 mg by mouth 3 (three) times daily as needed. For anxiety.    Historical Provider, MD  FLUoxetine (PROZAC) 40 MG capsule Take 40 mg by mouth 2 (two) times daily.  06/19/15   Historical Provider, MD  fluticasone (FLONASE) 50 MCG/ACT nasal spray Place 2 sprays into both nostrils daily.    Historical Provider, MD  Ibuprofen (MIDOL) 200 MG CAPS Take 2 capsules by mouth daily as needed. For pain    Historical Provider, MD  magnesium citrate SOLN Take 296 mLs (1 Bottle total) by mouth once. 06/22/16 06/22/16  [name], FNP  meclizine (ANTIVERT) 25 MG tablet Take 25 mg by mouth 4 (four) times daily as needed for dizziness.    Historical Provider, MD  Melatonin 5 MG CAPS Take 1 capsule by mouth at bedtime as needed (sleep).  11/27/15   Historical Provider, MD  methylPREDNISolone (MEDROL DOSEPAK) 4 MG TBPK tablet Take 6-5-4-3-2-1 po qd 06/22/16   [name], FNP  Multiple Vitamins-Minerals (MULTIVITAMIN WITH MINERALS) tablet Take 1 tablet by mouth daily.    Historical Provider, MD  omeprazole (PRILOSEC) 40 MG capsule TAKE 1 CAPSULE (40 MG TOTAL) BY MOUTH 2 (TWO) TIMES DAILY. 02/05/16   Ladene Artist, MD  ondansetron (ZOFRAN ODT) 4 MG disintegrating tablet Take 1 tablet (4 mg total) by mouth every 8 (eight) hours as needed for nausea or vomiting. 09/29/15    Samantha Tripp Dowless, PA-C  polyethylene glycol (MIRALAX / GLYCOLAX) packet Take 17 g by mouth daily.    Historical Provider, MD  sucralfate (CARAFATE) 1 g tablet Take 1 tablet (1 g total) by mouth 4 (four) times daily -  with meals and at bedtime. 12/21/15 12/26/15  Voncille Lo, MD   Meds Ordered and Administered this Visit   Medications  albuterol (PROVENTIL) (2.5 MG/3ML) 0.083% nebulizer solution 2.5 mg (2.5 mg Nebulization Given 06/22/16 1554)    BP 137/86 (BP Location: Left Arm)   Pulse (!) 108  Temp 98.5 F (36.9 C) (Oral)   Resp 20   LMP 06/23/2014   SpO2 96%  No data found.   Physical Exam  Constitutional: She is oriented to person, place, and time. She appears well-developed and well-nourished.  HENT:  Head: Normocephalic and atraumatic.  Eyes: Conjunctivae and EOM are normal. Pupils are equal, round, and reactive to light.  Neck: Normal range of motion. Neck supple.  Cardiovascular: Normal rate, regular rhythm and normal heart sounds.   Pulmonary/Chest: Effort normal and breath sounds normal.  Neurological: She is oriented to person, place, and time.  Nursing note and vitals reviewed.   Urgent Care Course     Procedures (including critical care time)  Labs Review Labs Reviewed - No data to display  Imaging Review No results found.   Visual Acuity Review  Right Eye Distance:   Left Eye Distance:   Bilateral Distance:    Right Eye Near:   Left Eye Near:    Bilateral Near:         MDM   1. Acute bronchitis due to other specified organisms   2. Cough   3. Other constipation    zpak Tessalon Medrol dose pack as directed  Neb treatment  Magnesium Citrate      [name], FNP 06/22/16 1718

## 2016-06-24 ENCOUNTER — Telehealth: Payer: Self-pay | Admitting: Gastroenterology

## 2016-06-25 ENCOUNTER — Ambulatory Visit (INDEPENDENT_AMBULATORY_CARE_PROVIDER_SITE_OTHER): Payer: Medicare HMO | Admitting: Physician Assistant

## 2016-06-25 ENCOUNTER — Other Ambulatory Visit (INDEPENDENT_AMBULATORY_CARE_PROVIDER_SITE_OTHER): Payer: Medicare HMO

## 2016-06-25 ENCOUNTER — Encounter: Payer: Self-pay | Admitting: Physician Assistant

## 2016-06-25 VITALS — BP 134/76 | HR 104 | Ht 66.75 in | Wt 224.5 lb

## 2016-06-25 DIAGNOSIS — R1013 Epigastric pain: Secondary | ICD-10-CM

## 2016-06-25 DIAGNOSIS — K648 Other hemorrhoids: Secondary | ICD-10-CM

## 2016-06-25 DIAGNOSIS — K219 Gastro-esophageal reflux disease without esophagitis: Secondary | ICD-10-CM

## 2016-06-25 DIAGNOSIS — R1084 Generalized abdominal pain: Secondary | ICD-10-CM | POA: Diagnosis not present

## 2016-06-25 DIAGNOSIS — K581 Irritable bowel syndrome with constipation: Secondary | ICD-10-CM

## 2016-06-25 DIAGNOSIS — Z86010 Personal history of colonic polyps: Secondary | ICD-10-CM | POA: Diagnosis not present

## 2016-06-25 LAB — IGA: IgA: 242 mg/dL (ref 68–378)

## 2016-06-25 MED ORDER — DEXLANSOPRAZOLE 60 MG PO CPDR: 60.0000 mg | DELAYED_RELEASE_CAPSULE | Freq: Every day | ORAL | 3 refills | Status: DC

## 2016-06-25 MED ORDER — HYOSCYAMINE SULFATE 0.125 MG SL SUBL
0.1250 mg | SUBLINGUAL_TABLET | Freq: Three times a day (TID) | SUBLINGUAL | 1 refills | Status: DC
Start: 2016-06-25 — End: 2016-09-19

## 2016-06-25 NOTE — Patient Instructions (Signed)
We have sent the following medications to your pharmacy for you to pick up at your convenience: Dexilant 60 mg daily   Hyoscyamine 0.125 mg three times a day 20 mins before meals  Use Miralax up to four times a day to produce a soft formed bowel movement.   Your physician has requested that you go to the basement forlab work before leaving today.

## 2016-06-25 NOTE — Progress Notes (Signed)
Reviewed and agree with initial management plan.  Jenkins Risdon T. Adira Limburg, MD FACG 

## 2016-06-25 NOTE — Telephone Encounter (Signed)
Patient with a 2 week history of constipation.  She has a cold and has been taking cold medicine and this she believes is contributing to the constipation.  She has been taking Miralax and Mag Citrate with very little relief.  Eating a meal causes pain.  She will come in and see Ellouise Newer, [place] today at 1:15

## 2016-06-25 NOTE — Progress Notes (Signed)
Chief Complaint: Constipation, Abdominal Pain, Rectal Pressure, Bloating and reflux  HPI: [name] is a 56 year old African-American female with a past medical history of anxiety, depression, reflux, IBS and obesity as well as others as below, who regularly follows with Dr. Fuller Plan and presents to clinic today with a complaint of constipation, abdominal pain, rectal pressure, reflux and bloating.   Patient was last seen in clinic by Dr. Fuller Plan on 01/10/16. At that time she described constipation. Her last colonoscopy was 01/2012 and showed 4 small adenomatous colon polyps and internal hemorrhoids. She also had EGD 06/2015 showing LA class grade a esophagitis. She was started on MiraLAX once or twice a day. She was continued on her omeprazole 40 mg twice a day and advised to discontinue NSAIDs. Her repeat colonoscopy was recommended in November 2018.   Patient was recently seen in the urgent care center on 06/22/16 with a complaint of cough and abdominal pain. She was diagnosed with acute bronchitis and given a Z-Pak and Tessalon Perles as well as a Medrol Dosepak. She was also recommended to use Mag citrate for constipation.   Today, the patient tells me that about 3 weeks ago she developed an upper respiratory cough/chest cold. She was coughing so much that she started taking naproxen to help with the chest pain she had developed and became constipated. Along with this she developed an epigastric pain and had an increase in reflux. She tells me that the constipation initially started with "clay colored", pencil stools, and not a bowel movement every day. She also admits she was not taking her MiraLAX at that time. She continued with symptoms off associated with a left lower quadrant abdominal pain which radiated through to her back. Patient was eventually seen in the urgent care as above and took mag citrate yesterday morning. This resulted in a lot of diarrhea and "caused rectal pressure", she does note  that when she had a bowel movement her belly pain decreased.   As far as her reflux patient has continued  on omeprazole 40 mg twice a day but tells me that whenever she eats or drinks anything she develops epigastric pain and has a large amount of gas. She also becomes severely bloated and feels as though the upper part of her abdomen becomes "hard to touch". Patient has noted that certain foods tend to make this worse and believes that she may have an allergy to gluten. She discusses that she was on hyoscyamine in the past for some of this pain which helped her.   Patient also tells me that she has been using some hydrocodone for her left-sided abdominal pain and this has been helping.   Patient denies fever, chills, blood in her stool, melena, weight loss, fatigue, anorexia, nausea, vomiting or symptoms that awaken her at night.  Past Medical History:  Diagnosis Date  . Anxiety   . Arthritis   . Asthma    stress induced asthma  . Chlamydia   . Depression   . Fibroid   . GERD (gastroesophageal reflux disease)   . Hyperlipidemia   . IBS (irritable bowel syndrome)   . Obesity   . Trichomonas   . Tubular adenoma of colon 01/2012    Past Surgical History:  Procedure Laterality Date  . colonscopy  2017  . COLPOSCOPY  1990   results were normal  . IR GENERIC HISTORICAL  03/21/2016   IR EMBO TUMOR ORGAN ISCHEMIA INFARCT INC GUIDE ROADMAPPING 03/21/2016 Arne Cleveland, MD [facility]  .  IR GENERIC HISTORICAL  03/21/2016   IR US GUIDE VASC ACCESS RIGHT 03/21/2016 Arne Cleveland, MD [facility]  . IR GENERIC HISTORICAL  03/21/2016   IR ANGIOGRAM SELECTIVE EACH ADDITIONAL VESSEL 03/21/2016 Arne Cleveland, MD [facility]  . IR GENERIC HISTORICAL  03/21/2016   IR ANGIOGRAM SELECTIVE EACH ADDITIONAL VESSEL 03/21/2016 Arne Cleveland, MD [facility]  . IR GENERIC HISTORICAL  03/21/2016   IR ANGIOGRAM PELVIS SELECTIVE OR SUPRASELECTIVE 03/21/2016 Arne Cleveland, MD [facility]  . IR  GENERIC HISTORICAL  03/21/2016   IR ANGIOGRAM PELVIS SELECTIVE OR SUPRASELECTIVE 03/21/2016 Arne Cleveland, MD [facility]  . IR GENERIC HISTORICAL  02/27/2016   IR RADIOLOGIST EVAL & MGMT 02/27/2016 Arne Cleveland, MD [facility]  . IR GENERIC HISTORICAL  04/03/2016   IR RADIOLOGIST EVAL & MGMT 04/03/2016 Arne Cleveland, MD [facility]  . labial cyst removal    . TUBAL LIGATION     20 years ago    Current Outpatient Prescriptions  Medication Sig Dispense Refill  . albuterol (PROVENTIL HFA;VENTOLIN HFA) 108 (90 BASE) MCG/ACT inhaler Inhale 2 puffs into the lungs every 6 (six) hours as needed for wheezing.     Marland Kitchen amphetamine-dextroamphetamine (ADDERALL) 30 MG tablet Take 7.5-15 mg by mouth 2 (two) times daily. RX is for 30 mg tab but pt takes 1/4 to 1/2 tab BID instead all together    . aspirin 81 MG tablet Take 81 mg by mouth daily.      Marland Kitchen azithromycin (ZITHROMAX) 250 MG tablet Take 1 tablet (250 mg total) by mouth daily. Take first 2 tablets together, then 1 every day until finished. 6 tablet 0  . benzonatate (TESSALON) 100 MG capsule Take 1 capsule (100 mg total) by mouth every 8 (eight) hours. 21 capsule 0  . Black Cohosh 40 MG CAPS Take 40 mg by mouth daily.    . Cholecalciferol (VITAMIN D-3 PO) Take 1 capsule by mouth daily.    . clonazePAM (KLONOPIN) 0.5 MG tablet Take 0.5 mg by mouth 3 (three) times daily as needed. For anxiety.    . Cyanocobalamin (VITAMIN B-12 PO) Take 1 tablet by mouth daily.    Marland Kitchen FLUoxetine (PROZAC) 40 MG capsule Take 40 mg by mouth 2 (two) times daily.     . fluticasone (FLONASE) 50 MCG/ACT nasal spray Place 2 sprays into both nostrils daily.    . Ibuprofen (MIDOL) 200 MG CAPS Take 2 capsules by mouth daily as needed. For pain    . meclizine (ANTIVERT) 25 MG tablet Take 25 mg by mouth 4 (four) times daily as needed for dizziness.    . Melatonin 5 MG CAPS Take 1 capsule by mouth at bedtime as needed (sleep).     . methylPREDNISolone (MEDROL DOSEPAK) 4  MG TBPK tablet Take 6-5-4-3-2-1 po qd 21 tablet 0  . Multiple Vitamins-Minerals (MULTIVITAMIN WITH MINERALS) tablet Take 1 tablet by mouth daily.    Marland Kitchen omeprazole (PRILOSEC) 40 MG capsule TAKE 1 CAPSULE (40 MG TOTAL) BY MOUTH 2 (TWO) TIMES DAILY. 60 capsule 11  . polyethylene glycol (MIRALAX / GLYCOLAX) packet Take 17 g by mouth daily.    Marland Kitchen dexlansoprazole (DEXILANT) 60 MG capsule Take 1 capsule (60 mg total) by mouth daily. 30 capsule 3  . hyoscyamine (LEVSIN SL) 0.125 MG SL tablet Place 1 tablet (0.125 mg total) under the tongue 3 (three) times daily before meals. 90 tablet 1   No current facility-administered medications for this visit.    Facility-Administered Medications Ordered in  Other Visits  Medication Dose Route Frequency Provider Last Rate Last Dose  . diphenhydrAMINE (BENADRYL) injection 12.5 mg  12.5 mg Intravenous Q6H PRN Arne Cleveland, MD       Or  . diphenhydrAMINE (BENADRYL) 12.5 MG/5ML elixir 12.5 mg  12.5 mg Oral Q6H PRN Arne Cleveland, MD      . HYDROmorphone (DILAUDID) 1 mg/mL PCA injection   Intravenous Q4H Arne Cleveland, MD      . naloxone [facility]) injection 0.4 mg  0.4 mg Intravenous PRN Arne Cleveland, MD       And  . sodium chloride flush (NS) 0.9 % injection 9 mL  9 mL Intravenous PRN Arne Cleveland, MD      . ondansetron [facility]) injection 4 mg  4 mg Intravenous Q6H PRN Arne Cleveland, MD        Allergies as of 06/25/2016  . (No Known Allergies)    Family History  Problem Relation Age of Onset  . Diabetes Father   . Diabetes Mother   . Hypertension Mother   . Deep vein thrombosis Mother   . Cancer - Other Mother     peritoneal cancer  . Colon cancer Maternal Grandmother   . Colon polyps Maternal Grandmother   . Rectal cancer Neg Hx   . Stomach cancer Neg Hx   . Pancreatic cancer Neg Hx   . Kidney disease Neg Hx   . Liver disease Neg Hx     Social History   Social History  . Marital status: Single    Spouse name: N/A  . Number of children: 2    . Years of education: N/A   Occupational History  . Not on file.   Social History Main Topics  . Smoking status: Never Smoker  . Smokeless tobacco: Never Used  . Alcohol use 0.0 oz/week     Comment: occ, not weekly  . Drug use: No  . Sexual activity: Not on file   Other Topics Concern  . Not on file   Social History Narrative  . No narrative on file    Review of Systems:    Constitutional: No weight loss, fever or chills Skin: No rash  Cardiovascular: No chest pain Respiratory: No SOB Gastrointestinal: See HPI and otherwise negative Genitourinary: No dysuria Neurological: No headache Musculoskeletal: No new muscle or joint pain Hematologic: No bleeding  Psychiatric: Positive history of depression and anxiety   Physical Exam:  Vital signs: BP 134/76 (BP Location: Left Arm, Patient Position: Sitting, Cuff Size: Large)   Pulse (!) 104   Ht 5' 6.75" (1.695 m) Comment: height measured without shoes  Wt 224 lb 8 oz (101.8 kg)   LMP 06/23/2014   BMI 35.43 kg/m   Constitutional:   Pleasant Overweight African-American female appears to be in NAD, Well developed, Well nourished, alert and cooperative Head:  Normocephalic and atraumatic. Eyes:   PEERL, EOMI. No icterus. Conjunctiva pink. Ears:  Normal auditory acuity. Neck:  Supple Throat: Oral cavity and pharynx without inflammation, swelling or lesion.  Respiratory: Respirations even and unlabored. Lungs clear to auscultation bilaterally.   No wheezes, crackles, or rhonchi.  Cardiovascular: Normal S1, S2. No MRG. Regular rate and rhythm. No peripheral edema, cyanosis or pallor.  Gastrointestinal:  Soft, mild distention, mild epigastric TTP, moderate left-sided abdominal TTP into her back No rebound or guarding. Normal bowel sounds. No appreciable masses or hepatomegaly. Rectal:  External exam: Multiple external hemorrhoids, noninflamed Msk:  Symmetrical without gross deformities. Without edema, no deformity or  joint  abnormality.  Neurologic:  Alert and  oriented x4;  grossly normal neurologically.  Skin:   Dry and intact without significant lesions or rashes. Psychiatric: Demonstrates good judgement and reason without abnormal affect or behaviors.  No recent labs or imaging  Assessment: 1. IBS: Patient initially had diarrhea related to IBS, now with constipation. She does note an emotional trigger to her abdominal pain and symptoms, this is consistent with IBS and she has been off her regular regimen with MiraLAX due to recent upper respiratory tract infection, likely due to all the medications taken at that time and her viral illness she had an exacerbation of symptoms 2. Abdominal pain: Likely related to constipation above as this is left-sided and radiates through to her back, if no change after regulating her bowels, will recommend further imaging including a CT abdomen pelvis with contrast 3. GERD: with epigastric discomfort, increased recently, seems to be sensitive to certain foods including gluten, question celiac disease versus gastritis versus functional dyspepsia versus other 4. Personal history of adenomatous colon polyps: Due for surveillance colonoscopy in November of this year. 5. Internal Hemorrhoids: pt experiencing some rectal pressure with BM after having diarrhea from Mag Citrate usage yest  Plan: 1. Patient will be due for repeat colonoscopy in November of this year for surveillance of her adenomatous polyps 2. Prescribed Hyoscyamine 0.125 mg 3 times daily, 20-30 minutes before eating #90 with 1 refill, did discuss that this can cause constipation, she should back off of this if notices this side effect 3. Would recommend the patient discontinue use of pain medicines as this can perpetuate her constipation 4. Patient is recommended to restart her regimen of MiraLAX, discussed that she can take this up to 4 times a day titrated to one soft solid bowel movement per day, she does believe this  was working prior to her sickness and "getting off schedule" 5. Changed patient from Omeprazole to Dexilant 60 mg daily. Did discuss with the patient that if this is too expensive for her we can try AcipHex 30 mg twice a day 6. Patient requested celiac testing, ordered celiac labs 7. Internal hemorrhoids likely cause of rectal pressure, recommended BID Prep h suppositories x 1 week 8. Patient to follow in clinic in 3-4 weeks with me or Dr. Royston Cowper, PA-C [facility] 06/25/2016, 4:02 PM  Cc: Kerin Perna, NP

## 2016-06-26 LAB — TISSUE TRANSGLUTAMINASE, IGA: Tissue Transglutaminase Ab, IgA: 1 U/mL (ref <4)

## 2016-07-03 ENCOUNTER — Ambulatory Visit: Payer: Medicare HMO | Admitting: Gastroenterology

## 2016-07-16 ENCOUNTER — Ambulatory Visit: Payer: Medicare HMO | Admitting: Physician Assistant

## 2016-08-07 ENCOUNTER — Other Ambulatory Visit (HOSPITAL_COMMUNITY): Payer: Self-pay | Admitting: Interventional Radiology

## 2016-08-07 ENCOUNTER — Other Ambulatory Visit: Payer: Self-pay | Admitting: Radiology

## 2016-08-07 DIAGNOSIS — D259 Leiomyoma of uterus, unspecified: Secondary | ICD-10-CM

## 2016-08-25 ENCOUNTER — Telehealth: Payer: Self-pay | Admitting: Gastroenterology

## 2016-08-25 NOTE — Telephone Encounter (Signed)
The pt was advised to call the ordering MD that recommended that she have the MRI pelvis.  She will need to have them add on to their order or have a follow up with Dr Fuller Plan as recommended by Anderson Malta to discuss further imaging.  She was scheduled to see Dr Fuller Plan.

## 2016-08-26 ENCOUNTER — Ambulatory Visit
Admission: RE | Admit: 2016-08-26 | Discharge: 2016-08-26 | Disposition: A | Discharge status: Home / Self Care | Payer: Medicare HMO | Source: Ambulatory Visit | Attending: Interventional Radiology | Admitting: Interventional Radiology

## 2016-08-26 ENCOUNTER — Ambulatory Visit (HOSPITAL_COMMUNITY)
Admission: RE | Admit: 2016-08-26 | Discharge: 2016-08-26 | Disposition: A | Discharge status: Home / Self Care | Payer: Medicare HMO | Source: Ambulatory Visit | Attending: Interventional Radiology | Admitting: Interventional Radiology

## 2016-08-26 DIAGNOSIS — D259 Leiomyoma of uterus, unspecified: Secondary | ICD-10-CM

## 2016-08-26 HISTORY — PX: IR RADIOLOGIST EVAL & MGMT: IMG5224

## 2016-08-26 MED ORDER — GADOBENATE DIMEGLUMINE 529 MG/ML IV SOLN
20.0000 mL | Freq: Once | INTRAVENOUS | Status: AC | PRN
  Administered 2016-08-26: 20 mL via INTRAVENOUS

## 2016-08-26 NOTE — Progress Notes (Signed)
Referring Physician(s):  Dr. Linda Hedges  Chief Complaint: The patient is seen in follow up today s/p uterine artery embolization 03/21/2016  History of present illness: [name] is a 56 y.o. female seen initially 02/27/2016  regarding her uterine fibroids. She was diagnosed with fibroids 16 years ago. She described pelvic pain, back pain, urinary frequency and urgency, nocturia, stress incontinence, and nausea, change in stool habits, and other GI symptoms. She is G4 P2 post tubal ligation. She is perimenopausal/menopausal, her last perceived menstrual cycle 2 years ago with no abnormal bleeding.  Patient underwent successful uterine artery embolization 03/21/2016.  She was assess in IR clinic 1 month post-procedure where she reported moderate pelvic discomfort.  She was prescribed Naproxen.  She presents to IR clinic today for 6 month follow-up post-procedure.  She reports she has been doing well.  She continues to complain of largely GI-related symptoms of constipation with intermittent diarrhea, back pain, and rectal pressure. She reports she has not been taking much pain medication to help with constipation.  She denies fever, spotting or bleeding.  She continues with her activities as usual.   Past Medical History:  Diagnosis Date  . Anxiety   . Arthritis   . Asthma    stress induced asthma  . Chlamydia   . Depression   . Fibroid   . GERD (gastroesophageal reflux disease)   . Hyperlipidemia   . IBS (irritable bowel syndrome)   . Obesity   . Trichomonas   . Tubular adenoma of colon 01/2012    Past Surgical History:  Procedure Laterality Date  . colonscopy  2017  . COLPOSCOPY  1990   results were normal  . IR GENERIC HISTORICAL  03/21/2016   IR EMBO TUMOR ORGAN ISCHEMIA INFARCT INC GUIDE ROADMAPPING 03/21/2016 Monica Cleveland, MD [facility]  . IR GENERIC HISTORICAL  03/21/2016   IR US GUIDE VASC ACCESS RIGHT 03/21/2016 Monica Cleveland, MD [facility]  . IR  GENERIC HISTORICAL  03/21/2016   IR ANGIOGRAM SELECTIVE EACH ADDITIONAL VESSEL 03/21/2016 Monica Cleveland, MD [facility]  . IR GENERIC HISTORICAL  03/21/2016   IR ANGIOGRAM SELECTIVE EACH ADDITIONAL VESSEL 03/21/2016 Monica Cleveland, MD [facility]  . IR GENERIC HISTORICAL  03/21/2016   IR ANGIOGRAM PELVIS SELECTIVE OR SUPRASELECTIVE 03/21/2016 Monica Cleveland, MD [facility]  . IR GENERIC HISTORICAL  03/21/2016   IR ANGIOGRAM PELVIS SELECTIVE OR SUPRASELECTIVE 03/21/2016 Monica Cleveland, MD [facility]  . IR GENERIC HISTORICAL  02/27/2016   IR RADIOLOGIST EVAL & MGMT 02/27/2016 Monica Cleveland, MD [facility]  . IR GENERIC HISTORICAL  04/03/2016   IR RADIOLOGIST EVAL & MGMT 04/03/2016 Monica Cleveland, MD [facility]  . labial cyst removal    . TUBAL LIGATION     20 years ago    Allergies: Patient has no known allergies.  Medications: Prior to Admission medications   Medication Sig Start Date End Date Taking? Authorizing Provider  albuterol (PROVENTIL HFA;VENTOLIN HFA) 108 (90 BASE) MCG/ACT inhaler Inhale 2 puffs into the lungs every 6 (six) hours as needed for wheezing.     [provider]  amphetamine-dextroamphetamine (ADDERALL) 30 MG tablet Take 7.5-15 mg by mouth 2 (two) times daily. RX is for 30 mg tab but pt takes 1/4 to 1/2 tab BID instead all together 06/19/15   [provider]  aspirin 81 MG tablet Take 81 mg by mouth daily.      [provider]  azithromycin (ZITHROMAX) 250 MG tablet Take  1 tablet (250 mg total) by mouth daily. Take first 2 tablets together, then 1 every day until finished. 06/22/16   Monica Penner, FNP  benzonatate (TESSALON) 100 MG capsule Take 1 capsule (100 mg total) by mouth every 8 (eight) hours. 06/22/16   Monica Penner, FNP  Black Cohosh 40 MG CAPS Take 40 mg by mouth daily.    [provider]  Cholecalciferol (VITAMIN D-3 PO) Take 1 capsule by mouth daily.    [provider]  clonazePAM  (KLONOPIN) 0.5 MG tablet Take 0.5 mg by mouth 3 (three) times daily as needed. For anxiety.    [provider]  Cyanocobalamin (VITAMIN B-12 PO) Take 1 tablet by mouth daily.    [provider]  dexlansoprazole (DEXILANT) 60 MG capsule Take 1 capsule (60 mg total) by mouth daily. 06/25/16   Monica Erp, PA  FLUoxetine (PROZAC) 40 MG capsule Take 40 mg by mouth 2 (two) times daily.  06/19/15   [provider]  fluticasone (FLONASE) 50 MCG/ACT nasal spray Place 2 sprays into both nostrils daily.    [provider]  hyoscyamine (LEVSIN SL) 0.125 MG SL tablet Place 1 tablet (0.125 mg total) under the tongue 3 (three) times daily before meals. 06/25/16   Monica Erp, PA  Ibuprofen (MIDOL) 200 MG CAPS Take 2 capsules by mouth daily as needed. For pain    [provider]  meclizine (ANTIVERT) 25 MG tablet Take 25 mg by mouth 4 (four) times daily as needed for dizziness.    [provider]  Melatonin 5 MG CAPS Take 1 capsule by mouth at bedtime as needed (sleep).  11/27/15   [provider]  methylPREDNISolone (MEDROL DOSEPAK) 4 MG TBPK tablet Take 6-5-4-3-2-1 po qd 06/22/16   Monica Penner, FNP  Multiple Vitamins-Minerals (MULTIVITAMIN WITH MINERALS) tablet Take 1 tablet by mouth daily.    [provider]  omeprazole (PRILOSEC) 40 MG capsule TAKE 1 CAPSULE (40 MG TOTAL) BY MOUTH 2 (TWO) TIMES DAILY. 02/05/16   Monica Artist, MD  polyethylene glycol [facility] Floria Raveling) packet Take 17 g by mouth daily.    [provider]     Family History  Problem Relation Age of Onset  . Diabetes Father   . Diabetes Mother   . Hypertension Mother   . Deep vein thrombosis Mother   . Cancer - Other Mother        peritoneal cancer  . Colon cancer Maternal Grandmother   . Colon polyps Maternal Grandmother   . Rectal cancer Neg Hx   . Stomach cancer Neg Hx   . Pancreatic cancer Neg Hx   . Kidney disease Neg Hx   .  Liver disease Neg Hx     Social History   Social History  . Marital status: Single    Spouse name: N/A  . Number of children: 2  . Years of education: N/A   Social History Main Topics  . Smoking status: Never Smoker  . Smokeless tobacco: Never Used  . Alcohol use 0.0 oz/week     Comment: occ, not weekly  . Drug use: No  . Sexual activity: Not on file   Other Topics Concern  . Not on file   Social History Narrative  . No narrative on file     Vital Signs: BP 119/77   Pulse (!) 117   Temp 98.7 F (37.1 C) (Oral)   Resp 16   Ht 5\' 7"  (  1.702 m)   Wt 235 lb (106.6 kg)   LMP 06/23/2014   SpO2 96%   BMI 36.81 kg/m   Physical Exam  NAD, alert Heart: regular rate and rhythm Chest: lungs clear to ausculation Abdomen: non tender to palpation  Imaging: Mr Pelvis W Wo Contrast  Result Date: 08/26/2016 CLINICAL DATA:  56 year old female status post uterine artery embolization. Followup evaluation. EXAM: MRI PELVIS WITHOUT AND WITH CONTRAST TECHNIQUE: Multiplanar multisequence MR imaging of the pelvis was performed both before and after administration of intravenous contrast. CONTRAST:  68mL MULTIHANCE GADOBENATE DIMEGLUMINE 529 MG/ML IV SOLN COMPARISON:  Pelvic MRI 03/06/2016. FINDINGS: Urinary Tract: Distal ureters and urinary bladder are unremarkable in appearance. Bowel:  Visualized portions are unremarkable. Vascular/Lymphatic: No aneurysm identified in the visualized pelvic vasculature. No lymphadenopathy noted in the pelvis. Reproductive: Uterus is decreased in size compared to the prior study measuring 7.8 x 4.5 x 5.4 cm (volume equals 95 mL) on today's examination. 2 well-circumscribed lesions are noted on today's examination in the body and fundus of the uterus. The largest of these measures 3.3 x 2.9 cm, decreased compared to the prior study (previously 3.6 x 3.1 cm on 03/06/2016). The smaller lesion measures 2.0 x 0.9 cm. Both of these lesions now demonstrate no  significant internal enhancement on post gadolinium imaging, with only minimal residual peripheral enhancement. The other smaller lesions seen on the prior examination are difficult to identify, although there does appear to be a tiny 8 mm T2 hypointense lesion in the left side of the anterior uterine body (axial image 15 of series 10) which previously measured 17 mm. No new lesions are identified. Ovaries are unremarkable in appearance. Other: No significant volume of ascites noted in the visualized portions of the peritoneal cavity. Musculoskeletal: No aggressive osseous lesions are noted in the visualized portions of the skeleton. IMPRESSION: 1. Today's study demonstrates a positive response to therapy with decrease uterine size and decreased number and size of previously noted uterine lesions, compatible with resolving treated fibroids, as above. Electronically Signed   By: [name] M.D.   On: 08/26/2016 14:04    Labs:  CBC:  Recent Labs  09/29/15 0607 12/21/15 1631 03/21/16 1200  WBC 7.5 6.7 4.5  HGB 12.5 12.2 12.9  HCT 38.5 38.5 38.4  PLT 191 220 254    COAGS:  Recent Labs  03/21/16 1200  INR 0.97    BMP:  Recent Labs  09/29/15 0607 12/21/15 1631 03/21/16 1200  NA 137 140 137  K 3.7 3.9 3.8  CL 105 105 103  CO2 22 27 24   GLUCOSE 123* 110* 104*  BUN 12 7 12   CALCIUM 9.6 9.5 9.3  CREATININE 0.93 0.96 0.67  GFRNONAA >60 >60 >60  GFRAA >60 >60 >60    LIVER FUNCTION TESTS:  Recent Labs  09/29/15 0607  BILITOT 0.3  AST 21  ALT 16  ALKPHOS 72  PROT 7.3  ALBUMIN 3.7    Assessment: Symptomatic uterine fibroid Patient with history of IBS, constipation, and back pain, and bulk symptoms related to uterine fibroids underwent successful uterine artery embolization 03/21/2016.  Reviewed imaging with Dr. Vernard Gambles.  Fibroid has decreased in size and no longer enhances on imaging.  Patient reports she initially had some improvement around 1 month  post-procedure, but since then has largely had many of the same GI-related symptoms including constipation, diarrhea, rectal pressure, reflux, and back pain.  She does have regular care from her gastroenterologist and plans to have a  follow-up visit soon.  She denies abnormal bleeding or pelvic pressure. Will continue to see in follow-up.   Signed: Docia Barrier, PA 08/26/2016, 4:30 PM   Please refer to Dr. Vernard Gambles attestation of this note for management and plan.

## 2016-08-28 ENCOUNTER — Ambulatory Visit (HOSPITAL_COMMUNITY): Payer: Medicare HMO | Admitting: Licensed Clinical Social Worker

## 2016-09-03 ENCOUNTER — Encounter (HOSPITAL_COMMUNITY): Payer: Self-pay | Admitting: Emergency Medicine

## 2016-09-03 DIAGNOSIS — Z79899 Other long term (current) drug therapy: Secondary | ICD-10-CM | POA: Diagnosis not present

## 2016-09-03 DIAGNOSIS — R109 Unspecified abdominal pain: Secondary | ICD-10-CM | POA: Diagnosis present

## 2016-09-03 DIAGNOSIS — K582 Mixed irritable bowel syndrome: Secondary | ICD-10-CM | POA: Insufficient documentation

## 2016-09-03 DIAGNOSIS — Z7982 Long term (current) use of aspirin: Secondary | ICD-10-CM | POA: Diagnosis not present

## 2016-09-03 DIAGNOSIS — J45909 Unspecified asthma, uncomplicated: Secondary | ICD-10-CM | POA: Diagnosis not present

## 2016-09-03 LAB — COMPREHENSIVE METABOLIC PANEL
ALT: 20 U/L (ref 14–54)
AST: 25 U/L (ref 15–41)
Albumin: 4.3 g/dL (ref 3.5–5.0)
Alkaline Phosphatase: 78 U/L (ref 38–126)
Anion gap: 10 (ref 5–15)
BUN: 12 mg/dL (ref 6–20)
CO2: 29 mmol/L (ref 22–32)
Calcium: 9.7 mg/dL (ref 8.9–10.3)
Chloride: 102 mmol/L (ref 101–111)
Creatinine, Ser: 0.84 mg/dL (ref 0.44–1.00)
GFR calc Af Amer: >60 mL/min (ref >60)
GFR calc non Af Amer: >60 mL/min (ref >60)
Glucose, Bld: 110 mg/dL — ABNORMAL HIGH (ref 65–99)
Potassium: 3.8 mmol/L (ref 3.5–5.1)
Sodium: 141 mmol/L (ref 135–145)
Total Bilirubin: 0.4 mg/dL (ref 0.3–1.2)
Total Protein: 7.8 g/dL (ref 6.5–8.1)

## 2016-09-03 LAB — CBC
HCT: 39.3 % (ref 36.0–46.0)
Hemoglobin: 12.8 g/dL (ref 12.0–15.0)
MCH: 25.1 pg — ABNORMAL LOW (ref 26.0–34.0)
MCHC: 32.6 g/dL (ref 30.0–36.0)
MCV: 77.1 fL — ABNORMAL LOW (ref 78.0–100.0)
Platelets: 249 10*3/uL (ref 150–400)
RBC: 5.1 MIL/uL (ref 3.87–5.11)
RDW: 14.4 % (ref 11.5–15.5)
WBC: 6.5 10*3/uL (ref 4.0–10.5)

## 2016-09-03 LAB — URINALYSIS, ROUTINE W REFLEX MICROSCOPIC
Bilirubin Urine: NEGATIVE
Glucose, UA: NEGATIVE mg/dL
Hgb urine dipstick: NEGATIVE
Ketones, ur: NEGATIVE mg/dL
Nitrite: NEGATIVE
Protein, ur: NEGATIVE mg/dL
Specific Gravity, Urine: 1.023 (ref 1.005–1.030)
pH: 7 (ref 5.0–8.0)

## 2016-09-03 LAB — LIPASE, BLOOD: Lipase: 35 U/L (ref 11–51)

## 2016-09-03 MED ORDER — ONDANSETRON 4 MG PO TBDP
ORAL_TABLET | ORAL | Status: AC
  Filled 2016-09-03: qty 1

## 2016-09-03 MED ORDER — ONDANSETRON 4 MG PO TBDP
4.0000 mg | ORAL_TABLET | Freq: Once | ORAL | Status: AC | PRN
  Administered 2016-09-03: 4 mg via ORAL

## 2016-09-03 NOTE — ED Triage Notes (Signed)
Pt presents with long lasting abd pain since July 2017 after eating a zaxbys salad; pt states she is constipated and bloated and has IBS and nausea; has followed up with GI MD with no answer; pt also belching very loudly in triage, states this happens a lot

## 2016-09-04 ENCOUNTER — Emergency Department (HOSPITAL_COMMUNITY)
Admission: EM | Admit: 2016-09-04 | Discharge: 2016-09-04 | Disposition: A | Discharge status: Home / Self Care | Payer: Medicare HMO | Attending: Emergency Medicine | Admitting: Emergency Medicine

## 2016-09-04 ENCOUNTER — Emergency Department (HOSPITAL_COMMUNITY): Payer: Medicare HMO

## 2016-09-04 DIAGNOSIS — K582 Mixed irritable bowel syndrome: Secondary | ICD-10-CM

## 2016-09-04 MED ORDER — IOPAMIDOL (ISOVUE-300) INJECTION 61%
INTRAVENOUS | Status: AC
  Administered 2016-09-04: 100 mL
  Filled 2016-09-04: qty 100

## 2016-09-04 MED ORDER — GI COCKTAIL ~~LOC~~
30.0000 mL | Freq: Once | ORAL | Status: AC
  Administered 2016-09-04: 30 mL via ORAL
  Filled 2016-09-04: qty 30

## 2016-09-04 NOTE — ED Notes (Signed)
Pt stable, ambulatory, states understanding of discharge instructions 

## 2016-09-04 NOTE — ED Provider Notes (Signed)
[facility] DEPT Provider Note   CSN: [id number] Arrival date & time: 09/03/16  5945  By signing my name below, I, [name], attest that this documentation has been prepared under the direction and in the presence of Monica Etienne, DO . Electronically Signed: Evelene Clarke, Scribe. 09/04/2016. 12:57 AM.  History   Chief Complaint Chief Complaint  Patient presents with  . Abdominal Pain    The history is provided by the patient. No language interpreter was used.  Abdominal Pain   This is a chronic problem. The current episode started more than 1 week ago. The problem occurs constantly. The problem has been gradually worsening. The pain is moderate. Associated symptoms include diarrhea and constipation. Pertinent negatives include fever, nausea, vomiting, dysuria, headaches, arthralgias and myalgias. Past workup includes GI consult. Her past medical history is significant for irritable bowel syndrome.     HPI Comments:  [name] is a 56 y.o. female with a history of IBS and GERD, who presents to the Emergency Department complaining of intermittent episodes of abdominal pain x ~1 year since eating [employer] in 2017. She notes her pain has been constant x a few days. She reports associated diarrhea x 2 days and states she was constipated prior to that. She has taken milk of magnesia and miralax but states she still feels bloated. Pt notes recent increase in her omeprazole. She also used ginger root last night with mild relief.   [facility]- GI  Past Medical History:  Diagnosis Date  . Anxiety   . Arthritis   . Asthma    stress induced asthma  . Chlamydia   . Depression   . Fibroid   . GERD (gastroesophageal reflux disease)   . Hyperlipidemia   . IBS (irritable bowel syndrome)   . Obesity   . Trichomonas   . Tubular adenoma of colon 01/2012    Patient Active Problem List   Diagnosis Date Noted  . Uterine leiomyoma 03/21/2016  . Chest pain 05/31/2012  . Dyslipidemia  05/31/2012  . VAGINAL PRURITUS 01/11/2010  . SINUSITIS, ACUTE 01/04/2010  . EUSTACHIAN TUBE DYSFUNCTION, BILATERAL 07/20/2009  . KNEE PAIN, LEFT 07/20/2009  . LYMPHADENOPATHY 07/20/2009  . TRICHOMONAL VAGINITIS 02/06/2009  . INSOMNIA 02/06/2009  . ANEMIA, MILD 11/16/2008  . GERD 08/30/2008  . DIZZINESS 08/30/2008  . BIPOLAR DISORDER UNSPECIFIED 07/27/2007  . ANXIETY STATE NOS 10/29/2006  . DISORDER, DEPRESSIVE NEC 10/29/2006  . ALLERGIC RHINITIS 10/29/2006  . IRRITABLE BOWEL SYNDROME 10/29/2006  . HYPERCHOLESTEROLEMIA, MILD 09/01/2005  . DISORDER, LIVER NOS 08/02/2002    Past Surgical History:  Procedure Laterality Date  . colonscopy  2017  . COLPOSCOPY  1990   results were normal  . IR GENERIC HISTORICAL  03/21/2016   IR EMBO TUMOR ORGAN ISCHEMIA INFARCT INC GUIDE ROADMAPPING 03/21/2016 Arne Cleveland, MD [facility]  . IR GENERIC HISTORICAL  03/21/2016   IR US GUIDE VASC ACCESS RIGHT 03/21/2016 Arne Cleveland, MD [facility]  . IR GENERIC HISTORICAL  03/21/2016   IR ANGIOGRAM SELECTIVE EACH ADDITIONAL VESSEL 03/21/2016 Arne Cleveland, MD [facility]  . IR GENERIC HISTORICAL  03/21/2016   IR ANGIOGRAM SELECTIVE EACH ADDITIONAL VESSEL 03/21/2016 Arne Cleveland, MD [facility]  . IR GENERIC HISTORICAL  03/21/2016   IR ANGIOGRAM PELVIS SELECTIVE OR SUPRASELECTIVE 03/21/2016 Arne Cleveland, MD [facility]  . IR GENERIC HISTORICAL  03/21/2016   IR ANGIOGRAM PELVIS SELECTIVE OR SUPRASELECTIVE 03/21/2016 Arne Cleveland, MD [facility]  . IR GENERIC HISTORICAL  02/27/2016   IR  RADIOLOGIST EVAL & MGMT 02/27/2016 Arne Cleveland, MD [facility]  . IR GENERIC HISTORICAL  04/03/2016   IR RADIOLOGIST EVAL & MGMT 04/03/2016 Arne Cleveland, MD [facility]  . labial cyst removal    . TUBAL LIGATION     20 years ago    OB History    Gravida Para Term Preterm AB Living   4 2 0 0 2 2   SAB TAB Ectopic Multiple Live Births   2 0             Home Medications     Prior to Admission medications   Medication Sig Start Date End Date Taking? Authorizing Provider  albuterol (PROVENTIL HFA;VENTOLIN HFA) 108 (90 BASE) MCG/ACT inhaler Inhale 2 puffs into the lungs every 6 (six) hours as needed for wheezing.     [provider]  amphetamine-dextroamphetamine (ADDERALL) 30 MG tablet Take 7.5-15 mg by mouth 2 (two) times daily. RX is for 30 mg tab but pt takes 1/4 to 1/2 tab BID instead all together 06/19/15   [provider]  aspirin 81 MG tablet Take 81 mg by mouth daily.      [provider]  azithromycin (ZITHROMAX) 250 MG tablet Take 1 tablet (250 mg total) by mouth daily. Take first 2 tablets together, then 1 every day until finished. Patient not taking: Reported on 08/26/2016 06/22/16   [name], FNP  benzonatate (TESSALON) 100 MG capsule Take 1 capsule (100 mg total) by mouth every 8 (eight) hours. Patient not taking: Reported on 08/26/2016 06/22/16   [name], FNP  Black Cohosh 40 MG CAPS Take 40 mg by mouth daily.    [provider]  Cholecalciferol (VITAMIN D-3 PO) Take 1 capsule by mouth daily.    [provider]  clonazePAM (KLONOPIN) 0.5 MG tablet Take 0.5 mg by mouth 3 (three) times daily as needed. For anxiety.    [provider]  Cyanocobalamin (VITAMIN B-12 PO) Take 1 tablet by mouth daily.    [provider]  dexlansoprazole (DEXILANT) 60 MG capsule Take 1 capsule (60 mg total) by mouth daily. 06/25/16   [name], PA  FLUoxetine (PROZAC) 40 MG capsule Take 40 mg by mouth 2 (two) times daily.  06/19/15   [provider]  fluticasone (FLONASE) 50 MCG/ACT nasal spray Place 2 sprays into both nostrils daily.    [provider]  hyoscyamine (LEVSIN SL) 0.125 MG SL tablet Place 1 tablet (0.125 mg total) under the tongue 3 (three) times daily before meals. 06/25/16   [name], PA  Ibuprofen (MIDOL) 200 MG CAPS Take 2 capsules by mouth daily  as needed. For pain    [provider]  meclizine (ANTIVERT) 25 MG tablet Take 25 mg by mouth 4 (four) times daily as needed for dizziness.    [provider]  Melatonin 5 MG CAPS Take 1 capsule by mouth at bedtime as needed (sleep).  11/27/15   [provider]  methylPREDNISolone (MEDROL DOSEPAK) 4 MG TBPK tablet Take 6-5-4-3-2-1 po qd Patient not taking: Reported on 08/26/2016 06/22/16   [name], FNP  Multiple Vitamins-Minerals (MULTIVITAMIN WITH MINERALS) tablet Take 1 tablet by mouth daily.    [provider]  omeprazole (PRILOSEC) 40 MG capsule TAKE 1 CAPSULE (40 MG TOTAL) BY MOUTH 2 (TWO) TIMES DAILY. Patient not taking: Reported on 08/26/2016 02/05/16   Ladene Artist, MD  polyethylene glycol [facility] / Floria Raveling) packet Take [phone]  g by mouth daily.    [provider]    Family History Family History  Problem Relation Age of Onset  . Diabetes Father   . Diabetes Mother   . Hypertension Mother   . Deep vein thrombosis Mother   . Cancer - Other Mother        peritoneal cancer  . Colon cancer Maternal Grandmother   . Colon polyps Maternal Grandmother   . Rectal cancer Neg Hx   . Stomach cancer Neg Hx   . Pancreatic cancer Neg Hx   . Kidney disease Neg Hx   . Liver disease Neg Hx     Social History Social History  Substance Use Topics  . Smoking status: Never Smoker  . Smokeless tobacco: Never Used  . Alcohol use 0.0 oz/week     Comment: occ, not weekly     Allergies   Patient has no known allergies.   Review of Systems Review of Systems  Constitutional: Negative for chills and fever.  HENT: Negative for congestion and rhinorrhea.   Eyes: Negative for redness and visual disturbance.  Respiratory: Negative for shortness of breath and wheezing.   Cardiovascular: Negative for chest pain and palpitations.  Gastrointestinal: Positive for abdominal distention, abdominal pain, constipation and diarrhea. Negative for nausea and  vomiting.  Genitourinary: Negative for dysuria and urgency.  Musculoskeletal: Negative for arthralgias and myalgias.  Skin: Negative for pallor and wound.  Neurological: Negative for dizziness and headaches.     Physical Exam Updated Vital Signs BP 136/89   Pulse (!) 101   Temp 98.2 F (36.8 C) (Oral)   Resp 18   Ht 5\' 7"  (1.702 m)   Wt 106.6 kg (235 lb)   LMP 06/23/2014   SpO2 98%   BMI 36.81 kg/m   Physical Exam  Constitutional: She is oriented to person, place, and time. She appears well-developed. No distress.  Obese Well appearing nontoxic   HENT:  Head: Normocephalic and atraumatic.  Eyes: EOM are normal. Pupils are equal, round, and reactive to light.  Neck: Normal range of motion. Neck supple.  Cardiovascular: Normal rate and regular rhythm.  Exam reveals no gallop and no friction rub.   No murmur heard. Pulmonary/Chest: Effort normal. She has no wheezes. She has no rales.  Abdominal: Soft. She exhibits no distension. There is no tenderness.  No focal abdominal tenderess  Musculoskeletal: She exhibits no edema or tenderness.  Neurological: She is alert and oriented to person, place, and time.  Skin: Skin is warm and dry. She is not diaphoretic.  Psychiatric: She has a normal mood and affect. Her behavior is normal.  Nursing note and vitals reviewed.    ED Treatments / Results  DIAGNOSTIC STUDIES:  Oxygen Saturation is 98% on RA, normal by my interpretation.    COORDINATION OF CARE:  6:46 AM Discussed treatment plan with pt at bedside and pt agreed to plan.  Labs (all labs ordered are listed, but only abnormal results are displayed) Labs Reviewed  COMPREHENSIVE METABOLIC PANEL - Abnormal; Notable for the following:       Result Value   Glucose, Bld 110 (*)    All other components within normal limits  CBC - Abnormal; Notable for the following:    MCV 77.1 (*)    MCH 25.1 (*)    All other components within normal limits  URINALYSIS, ROUTINE W  REFLEX MICROSCOPIC - Abnormal; Notable for the following:    APPearance HAZY (*)    Leukocytes,  UA SMALL (*)    Bacteria, UA RARE (*)    Squamous Epithelial / LPF 0-5 (*)    All other components within normal limits  LIPASE, BLOOD    EKG  EKG Interpretation None       Radiology Ct Abdomen Pelvis W Contrast  Result Date: 09/04/2016 CLINICAL DATA:  Chronic generalized abdominal pain. Bloating and constipation. Nausea. Initial encounter. EXAM: CT ABDOMEN AND PELVIS WITH CONTRAST TECHNIQUE: Multidetector CT imaging of the abdomen and pelvis was performed using the standard protocol following bolus administration of intravenous contrast. CONTRAST:  169mL ISOVUE-300 IOPAMIDOL (ISOVUE-300) INJECTION 61% COMPARISON:  Pelvis MRI performed 08/26/2016, and CT of the abdomen and pelvis from 12/09/2010 FINDINGS: Lower chest: The visualized lung bases are grossly clear. The visualized portions of the mediastinum are unremarkable. Hepatobiliary: The liver is unremarkable in appearance. The gallbladder is unremarkable in appearance. The common bile duct remains normal in caliber. Pancreas: The pancreas is within normal limits. Spleen: The spleen is unremarkable in appearance. Adrenals/Urinary Tract: The adrenal glands are unremarkable in appearance. The kidneys are within normal limits. There is no evidence of hydronephrosis. No renal or ureteral stones are identified. No perinephric stranding is seen. Stomach/Bowel: The stomach is unremarkable in appearance. The small bowel is within normal limits. The appendix is normal in caliber, without evidence of appendicitis. The colon is unremarkable in appearance. Vascular/Lymphatic: The abdominal aorta is unremarkable in appearance. The inferior vena cava is grossly unremarkable. No retroperitoneal lymphadenopathy is seen. No pelvic sidewall lymphadenopathy is identified. Reproductive: The bladder is mildly distended and grossly unremarkable. A residual uterine fibroid  is again noted at the uterine fundus. The ovaries are relatively symmetric. No suspicious adnexal masses are seen. Other: No additional soft tissue abnormalities are seen. Musculoskeletal: No acute osseous abnormalities are identified. The visualized musculature is unremarkable in appearance. IMPRESSION: 1. No acute abnormality seen within the abdomen or pelvis. 2. Uterine fibroid again noted. Electronically Signed   By: Garald Balding M.D.   On: 09/04/2016 02:16    Procedures Procedures (including critical care time)  Medications Ordered in ED Medications  ondansetron (ZOFRAN-ODT) disintegrating tablet 4 mg (4 mg Oral Given 09/03/16 1948)  gi cocktail (Maalox,Lidocaine,Donnatal) (30 mLs Oral Given 09/04/16 0035)  iopamidol (ISOVUE-300) 61 % injection (100 mLs  Contrast Given 09/04/16 0142)     Initial Impression / Assessment and Plan / ED Course  I have reviewed the triage vital signs and the nursing notes.  Pertinent labs & imaging results that were available during my care of the patient were reviewed by me and considered in my medical decision making (see chart for details).     56 yo F With a chief complaint of chronic abdominal pain. This been going on for at least a year. She is seen a gastroenterologist for this. Has a known diagnosis of irritable bowel syndrome. She feels that the pain has gotten mildly worse and is associated with diarrhea. This is after she took milk of magnesia. The patient's lab work was unremarkable. I discussed her results with her. Discussed the limited utility of imaging. The patient was adamant that she needed an imaging study. She was frustrated that she had not yet gotten one as an outpatient through her gastroenterologist.   CT negative for Acute pathology. Suggested she follow-up with her gastric neurologist.  6:46 AM:  I have discussed the diagnosis/risks/treatment options with the patient and family and believe the pt to be eligible for discharge home to  follow-up with GI.  We also discussed returning to the ED immediately if new or worsening sx occur. We discussed the sx which are most concerning (e.g., sudden worsening pain, fever, inability to tolerate by mouth) that necessitate immediate return. Medications administered to the patient during their visit and any new prescriptions provided to the patient are listed below.  Medications given during this visit Medications  ondansetron (ZOFRAN-ODT) disintegrating tablet 4 mg (4 mg Oral Given 09/03/16 1948)  gi cocktail (Maalox,Lidocaine,Donnatal) (30 mLs Oral Given 09/04/16 0035)  iopamidol (ISOVUE-300) 61 % injection (100 mLs  Contrast Given 09/04/16 0142)     The patient appears reasonably screen and/or stabilized for discharge and I doubt any other medical condition or other [facility] requiring further screening, evaluation, or treatment in the ED at this time prior to discharge.    Final Clinical Impressions(s) / ED Diagnoses   Final diagnoses:  Irritable bowel syndrome with both constipation and diarrhea    New Prescriptions Discharge Medication List as of 09/04/2016  2:37 AM      I personally performed the services described in this documentation, which was scribed in my presence. The recorded information has been reviewed and is accurate.     Monica Etienne, DO 09/04/16 [phone]

## 2016-09-06 ENCOUNTER — Encounter (HOSPITAL_COMMUNITY): Payer: Self-pay | Admitting: Nurse Practitioner

## 2016-09-06 ENCOUNTER — Emergency Department (HOSPITAL_COMMUNITY): Payer: Medicare HMO

## 2016-09-06 ENCOUNTER — Emergency Department (HOSPITAL_COMMUNITY)
Admission: EM | Admit: 2016-09-06 | Discharge: 2016-09-06 | Disposition: A | Discharge status: Home / Self Care | Payer: Medicare HMO | Attending: Emergency Medicine | Admitting: Emergency Medicine

## 2016-09-06 DIAGNOSIS — R1084 Generalized abdominal pain: Secondary | ICD-10-CM | POA: Diagnosis not present

## 2016-09-06 DIAGNOSIS — J45909 Unspecified asthma, uncomplicated: Secondary | ICD-10-CM | POA: Diagnosis not present

## 2016-09-06 DIAGNOSIS — R42 Dizziness and giddiness: Secondary | ICD-10-CM | POA: Insufficient documentation

## 2016-09-06 DIAGNOSIS — Z7982 Long term (current) use of aspirin: Secondary | ICD-10-CM | POA: Diagnosis not present

## 2016-09-06 DIAGNOSIS — R079 Chest pain, unspecified: Secondary | ICD-10-CM

## 2016-09-06 DIAGNOSIS — R0789 Other chest pain: Secondary | ICD-10-CM | POA: Diagnosis not present

## 2016-09-06 DIAGNOSIS — Z79899 Other long term (current) drug therapy: Secondary | ICD-10-CM | POA: Diagnosis not present

## 2016-09-06 LAB — COMPREHENSIVE METABOLIC PANEL
ALT: 20 U/L (ref 14–54)
AST: 28 U/L (ref 15–41)
Albumin: 4.3 g/dL (ref 3.5–5.0)
Alkaline Phosphatase: 76 U/L (ref 38–126)
Anion gap: 11 (ref 5–15)
BUN: 8 mg/dL (ref 6–20)
CO2: 26 mmol/L (ref 22–32)
Calcium: 9.4 mg/dL (ref 8.9–10.3)
Chloride: 105 mmol/L (ref 101–111)
Creatinine, Ser: 0.83 mg/dL (ref 0.44–1.00)
GFR calc Af Amer: >60 mL/min (ref >60)
GFR calc non Af Amer: >60 mL/min (ref >60)
Glucose, Bld: 111 mg/dL — ABNORMAL HIGH (ref 65–99)
Potassium: 4 mmol/L (ref 3.5–5.1)
Sodium: 142 mmol/L (ref 135–145)
Total Bilirubin: 0.3 mg/dL (ref 0.3–1.2)
Total Protein: 7.8 g/dL (ref 6.5–8.1)

## 2016-09-06 LAB — CBC WITH DIFFERENTIAL/PLATELET
Basophils Absolute: 0 10*3/uL (ref 0.0–0.1)
Basophils Relative: 0 %
Eosinophils Absolute: 0.1 10*3/uL (ref 0.0–0.7)
Eosinophils Relative: 1 %
HCT: 38.7 % (ref 36.0–46.0)
Hemoglobin: 12.7 g/dL (ref 12.0–15.0)
Lymphocytes Relative: 53 %
Lymphs Abs: 3.4 10*3/uL (ref 0.7–4.0)
MCH: 25.2 pg — ABNORMAL LOW (ref 26.0–34.0)
MCHC: 32.8 g/dL (ref 30.0–36.0)
MCV: 76.8 fL — ABNORMAL LOW (ref 78.0–100.0)
Monocytes Absolute: 0.6 10*3/uL (ref 0.1–1.0)
Monocytes Relative: 9 %
Neutro Abs: 2.4 10*3/uL (ref 1.7–7.7)
Neutrophils Relative %: 37 %
Platelets: 229 10*3/uL (ref 150–400)
RBC: 5.04 MIL/uL (ref 3.87–5.11)
RDW: 14.8 % (ref 11.5–15.5)
WBC: 6.5 10*3/uL (ref 4.0–10.5)

## 2016-09-06 LAB — I-STAT TROPONIN, ED: Troponin i, poc: 0 ng/mL (ref 0.00–0.08)

## 2016-09-06 LAB — LIPASE, BLOOD: Lipase: 31 U/L (ref 11–51)

## 2016-09-06 MED ORDER — CLONAZEPAM 0.5 MG PO TABS: 0.5000 mg | ORAL_TABLET | Freq: Three times a day (TID) | ORAL | 0 refills | Status: DC | PRN

## 2016-09-06 MED ORDER — PROMETHAZINE HCL 25 MG PO TABS: 25.0000 mg | ORAL_TABLET | Freq: Four times a day (QID) | ORAL | 0 refills | Status: DC | PRN

## 2016-09-06 NOTE — ED Notes (Signed)
Patient verbalizes understanding of discharge instructions, home care and follow up care. Patient out of department at this time. 

## 2016-09-06 NOTE — ED Triage Notes (Signed)
Pt presents with multiple complaints but mostly c/o epigastric pain 10/10 endorsing an extensive GI hx.

## 2016-09-06 NOTE — ED Notes (Signed)
Patient states chest pain that started at 10pm 09/05/16 that radiates to upper back abdomin and pain into great toe.

## 2016-09-06 NOTE — ED Provider Notes (Signed)
[place] DEPT Provider Note   CSN: [id number] Arrival date & time: 09/06/16  0202     History   Chief Complaint Chief Complaint  Patient presents with  . Chest Pain  . Abdominal Pain    HPI [name] is a 56 y.o. female.  Patient with a history of IBS, GERD, asthma and anxiety presents with complaint of abdominal and chest pain with excessive flatulence and belching. She states the chest pain is right sided and sharp. No vomiting, fever, cough, SOB. She was seen on 09/04/16 in the emergency room for similar symptoms but returns because it now involves her chest. She reports her medications for IBS were changed approximately 2 months ago by GI but she does not feel she is under any greater control.    The history is provided by the patient. No language interpreter was used.  Chest Pain   Associated symptoms include abdominal pain and dizziness. Pertinent negatives include no fever, no shortness of breath and no vomiting.  Abdominal Pain   Pertinent negatives include fever and vomiting.    Past Medical History:  Diagnosis Date  . Anxiety   . Arthritis   . Asthma    stress induced asthma  . Chlamydia   . Depression   . Fibroid   . GERD (gastroesophageal reflux disease)   . Hyperlipidemia   . IBS (irritable bowel syndrome)   . Obesity   . Trichomonas   . Tubular adenoma of colon 01/2012    Patient Active Problem List   Diagnosis Date Noted  . Uterine leiomyoma 03/21/2016  . Chest pain 05/31/2012  . Dyslipidemia 05/31/2012  . VAGINAL PRURITUS 01/11/2010  . SINUSITIS, ACUTE 01/04/2010  . EUSTACHIAN TUBE DYSFUNCTION, BILATERAL 07/20/2009  . KNEE PAIN, LEFT 07/20/2009  . LYMPHADENOPATHY 07/20/2009  . TRICHOMONAL VAGINITIS 02/06/2009  . INSOMNIA 02/06/2009  . ANEMIA, MILD 11/16/2008  . GERD 08/30/2008  . DIZZINESS 08/30/2008  . BIPOLAR DISORDER UNSPECIFIED 07/27/2007  . ANXIETY STATE NOS 10/29/2006  . DISORDER, DEPRESSIVE NEC 10/29/2006  . ALLERGIC  RHINITIS 10/29/2006  . IRRITABLE BOWEL SYNDROME 10/29/2006  . HYPERCHOLESTEROLEMIA, MILD 09/01/2005  . DISORDER, LIVER NOS 08/02/2002    Past Surgical History:  Procedure Laterality Date  . colonscopy  2017  . COLPOSCOPY  1990   results were normal  . IR GENERIC HISTORICAL  03/21/2016   IR EMBO TUMOR ORGAN ISCHEMIA INFARCT INC GUIDE ROADMAPPING 03/21/2016 Arne Cleveland, MD [facility]  . IR GENERIC HISTORICAL  03/21/2016   IR US GUIDE VASC ACCESS RIGHT 03/21/2016 Arne Cleveland, MD [facility]  . IR GENERIC HISTORICAL  03/21/2016   IR ANGIOGRAM SELECTIVE EACH ADDITIONAL VESSEL 03/21/2016 Arne Cleveland, MD [facility]  . IR GENERIC HISTORICAL  03/21/2016   IR ANGIOGRAM SELECTIVE EACH ADDITIONAL VESSEL 03/21/2016 Arne Cleveland, MD [facility]  . IR GENERIC HISTORICAL  03/21/2016   IR ANGIOGRAM PELVIS SELECTIVE OR SUPRASELECTIVE 03/21/2016 Arne Cleveland, MD [facility]  . IR GENERIC HISTORICAL  03/21/2016   IR ANGIOGRAM PELVIS SELECTIVE OR SUPRASELECTIVE 03/21/2016 Arne Cleveland, MD [facility]  . IR GENERIC HISTORICAL  02/27/2016   IR RADIOLOGIST EVAL & MGMT 02/27/2016 Arne Cleveland, MD [facility]  . IR GENERIC HISTORICAL  04/03/2016   IR RADIOLOGIST EVAL & MGMT 04/03/2016 Arne Cleveland, MD [facility]  . labial cyst removal    . TUBAL LIGATION     20 years ago    OB History    Gravida Para Term Preterm AB Living  4 2 0 0 2 2   SAB TAB Ectopic Multiple Live Births   2 0             Home Medications    Prior to Admission medications   Medication Sig Start Date End Date Taking? Authorizing Provider  albuterol (PROVENTIL HFA;VENTOLIN HFA) 108 (90 BASE) MCG/ACT inhaler Inhale 2 puffs into the lungs every 6 (six) hours as needed for wheezing.    Yes [provider]  aspirin 81 MG tablet Take 81 mg by mouth daily.     Yes [provider]  Cholecalciferol (VITAMIN D-3 PO) Take 1 capsule by mouth daily.   Yes [provider]   clonazePAM (KLONOPIN) 0.5 MG tablet Take 0.5 mg by mouth 3 (three) times daily as needed for anxiety. For anxiety.   Yes [provider]  Cyanocobalamin (VITAMIN B-12 PO) Take 1 tablet by mouth daily.   Yes [provider]  dexlansoprazole (DEXILANT) 60 MG capsule Take 1 capsule (60 mg total) by mouth daily. Patient taking differently: Take 60 mg by mouth daily after breakfast.  06/25/16  Yes [name], PA  FLUoxetine (PROZAC) 40 MG capsule Take 40 mg by mouth 2 (two) times daily.  06/19/15  Yes [provider]  fluticasone (FLONASE) 50 MCG/ACT nasal spray Place 2 sprays into both nostrils daily.   Yes [provider]  hyoscyamine (LEVSIN SL) 0.125 MG SL tablet Place 1 tablet (0.125 mg total) under the tongue 3 (three) times daily before meals. 06/25/16  Yes [name], PA  Ibuprofen (MIDOL) 200 MG CAPS Take 400 capsules by mouth every 6 (six) hours as needed (pain). For pain    Yes [provider]  Melatonin 5 MG CAPS Take 5 mg by mouth at bedtime as needed (sleep).  11/27/15  Yes [provider]  azithromycin (ZITHROMAX) 250 MG tablet Take 1 tablet (250 mg total) by mouth daily. Take first 2 tablets together, then 1 every day until finished. Patient not taking: Reported on 08/26/2016 06/22/16   [name], FNP  benzonatate (TESSALON) 100 MG capsule Take 1 capsule (100 mg total) by mouth every 8 (eight) hours. Patient not taking: Reported on 08/26/2016 06/22/16   [name], FNP  methylPREDNISolone (MEDROL DOSEPAK) 4 MG TBPK tablet Take 6-5-4-3-2-1 po qd Patient not taking: Reported on 08/26/2016 06/22/16   [name], FNP  omeprazole (PRILOSEC) 40 MG capsule TAKE 1 CAPSULE (40 MG TOTAL) BY MOUTH 2 (TWO) TIMES DAILY. Patient not taking: Reported on 08/26/2016 02/05/16   Ladene Artist, MD    Family History Family History  Problem Relation Age of Onset  . Diabetes Father   . Diabetes Mother   . Hypertension  Mother   . Deep vein thrombosis Mother   . Cancer - Other Mother        peritoneal cancer  . Colon cancer Maternal Grandmother   . Colon polyps Maternal Grandmother   . Rectal cancer Neg Hx   . Stomach cancer Neg Hx   . Pancreatic cancer Neg Hx   . Kidney disease Neg Hx   . Liver disease Neg Hx     Social History Social History  Substance Use Topics  . Smoking status: Never Smoker  . Smokeless tobacco: Never Used  . Alcohol use 0.0 oz/week     Comment: occ, not weekly     Allergies   Patient has no known allergies.   Review of Systems Review of Systems  Constitutional: Negative for chills and fever.  HENT: Negative.   Respiratory: Negative.  Negative for shortness of breath.   Cardiovascular: Positive for chest pain.  Gastrointestinal: Positive for abdominal pain. Negative for vomiting.  Musculoskeletal: Negative.   Skin: Negative.   Neurological: Positive for dizziness. Negative for syncope.     Physical Exam Updated Vital Signs BP (!) 146/84 (BP Location: Right Arm)   Pulse 87   Resp 18   Ht 5\' 6"  (1.676 m)   Wt 106.6 kg (235 lb)   LMP 06/23/2014   SpO2 95%   BMI 37.93 kg/m   Physical Exam  Constitutional: She is oriented to person, place, and time. She appears well-developed and well-nourished.  HENT:  Head: Normocephalic.  Neck: Normal range of motion. Neck supple.  Cardiovascular: Normal rate and regular rhythm.   No murmur heard. Pulmonary/Chest: Effort normal and breath sounds normal. She has no wheezes. She has no rales. She exhibits no tenderness.  Abdominal: Soft. Bowel sounds are normal. She exhibits no distension. There is no tenderness. There is no rebound and no guarding.  Actively belching and flatulent.   Musculoskeletal: Normal range of motion.  Neurological: She is alert and oriented to person, place, and time.  Skin: Skin is warm and dry.  Psychiatric: She has a normal mood and affect.     ED Treatments / Results  Labs (all  labs ordered are listed, but only abnormal results are displayed) Labs Reviewed  CBC WITH DIFFERENTIAL/PLATELET - Abnormal; Notable for the following:       Result Value   MCV 76.8 (*)    MCH 25.2 (*)    All other components within normal limits  COMPREHENSIVE METABOLIC PANEL - Abnormal; Notable for the following:    Glucose, Bld 111 (*)    All other components within normal limits  LIPASE, BLOOD  I-STAT TROPOININ, ED   Results for orders placed or performed during the hospital encounter of 09/06/16  CBC with Differential  Result Value Ref Range   WBC 6.5 4.0 - 10.5 K/uL   RBC 5.04 3.87 - 5.11 MIL/uL   Hemoglobin 12.7 12.0 - 15.0 g/dL   HCT 38.7 36.0 - 46.0 %   MCV 76.8 (L) 78.0 - 100.0 fL   MCH 25.2 (L) 26.0 - 34.0 pg   MCHC 32.8 30.0 - 36.0 g/dL   RDW 14.8 11.5 - 15.5 %   Platelets 229 150 - 400 K/uL   Neutrophils Relative % 37 %   Neutro Abs 2.4 1.7 - 7.7 K/uL   Lymphocytes Relative 53 %   Lymphs Abs 3.4 0.7 - 4.0 K/uL   Monocytes Relative 9 %   Monocytes Absolute 0.6 0.1 - 1.0 K/uL   Eosinophils Relative 1 %   Eosinophils Absolute 0.1 0.0 - 0.7 K/uL   Basophils Relative 0 %   Basophils Absolute 0.0 0.0 - 0.1 K/uL  Comprehensive metabolic panel  Result Value Ref Range   Sodium 142 135 - 145 mmol/L   Potassium 4.0 3.5 - 5.1 mmol/L   Chloride 105 101 - 111 mmol/L   CO2 26 22 - 32 mmol/L   Glucose, Bld 111 (H) 65 - 99 mg/dL   BUN 8 6 - 20 mg/dL   Creatinine, Ser 0.83 0.44 - 1.00 mg/dL   Calcium 9.4 8.9 - 10.3 mg/dL   Total Protein 7.8 6.5 - 8.1 g/dL   Albumin 4.3 3.5 - 5.0 g/dL   AST 28 15 - 41 U/L   ALT 20  14 - 54 U/L   Alkaline Phosphatase 76 38 - 126 U/L   Total Bilirubin 0.3 0.3 - 1.2 mg/dL   GFR calc non Af Amer >60 >60 mL/min   GFR calc Af Amer >60 >60 mL/min   Anion gap 11 5 - 15  Lipase, blood  Result Value Ref Range   Lipase 31 11 - 51 U/L  I-stat troponin, ED  Result Value Ref Range   Troponin i, poc 0.00 0.00 - 0.08 ng/mL   Comment 3             EKG  EKG Interpretation None       Radiology Dg Chest 2 View  Result Date: 09/06/2016 CLINICAL DATA:  Acute onset of epigastric abdominal pain. Dizziness and lightheadedness. Generalized chest pain and shortness of breath. Belching. Initial encounter. EXAM: CHEST  2 VIEW COMPARISON:  Chest radiograph and CTA of the chest performed 12/21/2015 FINDINGS: The lungs are well-aerated. Minimal bibasilar atelectasis is noted. There is no evidence of pleural effusion or pneumothorax. The heart is normal in size; the mediastinal contour is within normal limits. No acute osseous abnormalities are seen. IMPRESSION: Minimal bibasilar atelectasis noted.  Lungs otherwise clear. Electronically Signed   By: Garald Balding M.D.   On: 09/06/2016 04:44    Procedures Procedures (including critical care time)  Medications Ordered in ED Medications - No data to display   Initial Impression / Assessment and Plan / ED Course  I have reviewed the triage vital signs and the nursing notes.  Pertinent labs & imaging results that were available during my care of the patient were reviewed by me and considered in my medical decision making (see chart for details).     Patient with history of IBS with symptoms of excessive gas, abdominal pain radiating to right chest. Symptoms have been present fo rthe past 3-4 days.   Labs are normal. VSS. She is well appearing. Chart reviewed. Labs from 09/04/16 are unremarkable. CT scan done at that time is also without acute abnormality.  Discussed close follow up with GI. She is felt stable for discharge home.    Final Clinical Impressions(s) / ED Diagnoses   Final diagnoses:  None   1. Abdominal pain 2. Nonspecific chest pain 3. History of IBS  New Prescriptions New Prescriptions   No medications on file     Charlann Lange, Hershal Coria 09/06/16 [place], [place], DO 09/06/16 [phone]

## 2016-09-06 NOTE — Discharge Instructions (Signed)
Follow up with your primary care and your GI doctors as scheduled. Return to the emergency department with any high fever, severe pain, or new concern. Continue your regular medications.

## 2016-09-06 NOTE — ED Notes (Signed)
Pt with hx of IBS c/o constipation, nausea, dizziness, right jaw pain, right shoulder pain, right arm pain. Pt states that she can pass gas and is frequently belching loudly in triage.

## 2016-09-19 ENCOUNTER — Encounter (HOSPITAL_COMMUNITY): Payer: Self-pay | Admitting: Emergency Medicine

## 2016-09-19 ENCOUNTER — Ambulatory Visit (HOSPITAL_COMMUNITY)
Admission: RE | Admit: 2016-09-19 | Discharge: 2016-09-19 | Disposition: A | Discharge status: Home / Self Care | Payer: Medicare HMO | Source: Home / Self Care | Attending: Psychiatry | Admitting: Psychiatry

## 2016-09-19 ENCOUNTER — Emergency Department (HOSPITAL_COMMUNITY)
Admission: EM | Admit: 2016-09-19 | Discharge: 2016-09-20 | Disposition: A | Discharge status: Home / Self Care | Payer: Medicare HMO | Attending: Emergency Medicine | Admitting: Emergency Medicine

## 2016-09-19 DIAGNOSIS — F331 Major depressive disorder, recurrent, moderate: Secondary | ICD-10-CM | POA: Diagnosis not present

## 2016-09-19 DIAGNOSIS — F419 Anxiety disorder, unspecified: Secondary | ICD-10-CM | POA: Diagnosis not present

## 2016-09-19 DIAGNOSIS — J45909 Unspecified asthma, uncomplicated: Secondary | ICD-10-CM | POA: Insufficient documentation

## 2016-09-19 DIAGNOSIS — R45851 Suicidal ideations: Secondary | ICD-10-CM | POA: Diagnosis not present

## 2016-09-19 DIAGNOSIS — F329 Major depressive disorder, single episode, unspecified: Secondary | ICD-10-CM | POA: Insufficient documentation

## 2016-09-19 DIAGNOSIS — F4322 Adjustment disorder with anxiety: Secondary | ICD-10-CM | POA: Diagnosis present

## 2016-09-19 DIAGNOSIS — F319 Bipolar disorder, unspecified: Secondary | ICD-10-CM | POA: Diagnosis present

## 2016-09-19 DIAGNOSIS — Z7982 Long term (current) use of aspirin: Secondary | ICD-10-CM | POA: Insufficient documentation

## 2016-09-19 LAB — CBC
HCT: 36.8 % (ref 36.0–46.0)
Hemoglobin: 12.4 g/dL (ref 12.0–15.0)
MCH: 25.7 pg — ABNORMAL LOW (ref 26.0–34.0)
MCHC: 33.7 g/dL (ref 30.0–36.0)
MCV: 76.2 fL — ABNORMAL LOW (ref 78.0–100.0)
Platelets: 225 10*3/uL (ref 150–400)
RBC: 4.83 MIL/uL (ref 3.87–5.11)
RDW: 14.4 % (ref 11.5–15.5)
WBC: 6.7 10*3/uL (ref 4.0–10.5)

## 2016-09-19 LAB — RAPID URINE DRUG SCREEN, HOSP PERFORMED
Amphetamines: NOT DETECTED
Barbiturates: NOT DETECTED
Benzodiazepines: NOT DETECTED
Cocaine: NOT DETECTED
Opiates: NOT DETECTED
Tetrahydrocannabinol: NOT DETECTED

## 2016-09-19 LAB — COMPREHENSIVE METABOLIC PANEL
ALT: 16 U/L (ref 14–54)
AST: 21 U/L (ref 15–41)
Albumin: 4.4 g/dL (ref 3.5–5.0)
Alkaline Phosphatase: 76 U/L (ref 38–126)
Anion gap: 9 (ref 5–15)
BUN: 9 mg/dL (ref 6–20)
CO2: 25 mmol/L (ref 22–32)
Calcium: 9.2 mg/dL (ref 8.9–10.3)
Chloride: 107 mmol/L (ref 101–111)
Creatinine, Ser: 0.85 mg/dL (ref 0.44–1.00)
GFR calc Af Amer: >60 mL/min (ref >60)
GFR calc non Af Amer: >60 mL/min (ref >60)
Glucose, Bld: 119 mg/dL — ABNORMAL HIGH (ref 65–99)
Potassium: 3.8 mmol/L (ref 3.5–5.1)
Sodium: 141 mmol/L (ref 135–145)
Total Bilirubin: 0.6 mg/dL (ref 0.3–1.2)
Total Protein: 7.8 g/dL (ref 6.5–8.1)

## 2016-09-19 LAB — SALICYLATE LEVEL: Salicylate Lvl: <7 mg/dL (ref 2.8–30.0)

## 2016-09-19 LAB — ACETAMINOPHEN LEVEL: Acetaminophen (Tylenol), Serum: <10 ug/mL — ABNORMAL LOW (ref 10–30)

## 2016-09-19 LAB — ETHANOL: Alcohol, Ethyl (B): <5 mg/dL (ref <5)

## 2016-09-19 MED ORDER — TRAZODONE HCL 100 MG PO TABS: 100.0000 mg | ORAL_TABLET | Freq: Every evening | ORAL | Status: DC | PRN

## 2016-09-19 MED ORDER — LORAZEPAM 1 MG PO TABS
1.0000 mg | ORAL_TABLET | Freq: Once | ORAL | Status: AC
  Administered 2016-09-19: 1 mg via ORAL
  Filled 2016-09-19: qty 1

## 2016-09-19 MED ORDER — HYDROXYZINE HCL 25 MG PO TABS
50.0000 mg | ORAL_TABLET | Freq: Four times a day (QID) | ORAL | Status: DC | PRN
  Administered 2016-09-19 – 2016-09-20 (×2): 50 mg via ORAL
  Filled 2016-09-19 (×2): qty 2

## 2016-09-19 NOTE — ED Notes (Signed)
Pt. Awake after vitals.

## 2016-09-19 NOTE — ED Notes (Signed)
Bed: [facility] Expected date:  Expected time:  Means of arrival:  Comments: 

## 2016-09-19 NOTE — ED Notes (Signed)
Report to include situation, background, assessment and recommendations from Edie RN. Patient sleeping, respirations regular and unlabored. Q15 minute rounds and security camera observation to continue.   

## 2016-09-19 NOTE — Consult Note (Signed)
  Pt is not to receive opiates or benzodiazepines, Pt is medication seeking. Pt's UDS was negative and BAL<5 on admission to the emergency room.    Jinny Blossom, NP-C 09-18-16   1539

## 2016-09-19 NOTE — ED Notes (Signed)
Hourly rounding reveals patient sleeping in room. No complaints, stable, in no acute distress. Q15 minute rounds and monitoring via Security Cameras to continue. 

## 2016-09-19 NOTE — ED Notes (Signed)
Hourly rounding reveals patient sleeping room. No complaints, stable, in no acute distress. Q15 minute rounds and monitoring via Security Cameras to continue. 

## 2016-09-19 NOTE — ED Notes (Signed)
Pt continues to be tearful in bed.  Pt was observed ambulating in the hallway to the bathroom with a steady gait.

## 2016-09-19 NOTE — ED Notes (Signed)
Pt's son, Zenia Resides: [phone]

## 2016-09-19 NOTE — H&P (Signed)
Behavioral Health Medical Screening Exam  [name] is an 56 y.o. female who presents with her [name], as a walk-in patient. Apparently patient presented to [facility] earlier, but left prior to checking-in. Patient states that she has not seen her psychiatrist since October, due to her medicaid being dropped. She states that her medicaid was recently reinstated, so she is trying to get started back on her medications. States that she is trying to get started back on clonazepam, but her primary care provider will not give it to her. States "I have been to the emergency room twice recently and they will not give me my medication either." Patient reported to TTS that she would like inpatient treatment, but when this writer asked her about inpatient treatment, she stated that she did not want inpatient treatment, she just wanted to get started back on her medications. Offered to send patient to [facility] for medical clearance and psychiatric evaluation. Patient continued to ask for medications. Informed that I could not provide her with medications and and then send her home. Patient became irate and stated that she did not understand why she just could not get her medications.  When this writer once again explained that I was not able to provide her with a prescription, she stormed out of the exam room and started screaming loudly to be let out. Moishe Spice, RN was able to calm patient and have her return to the exam room. Patient continued to refuse transfer to [facility] and continued to ruminate on restarting clonazepam. Patient states " I will be fine, just let me leave." Fiance, [name],  states that he feels the patient will be safe and that he plans to stay with her. Patient signed AMA form, which was witnessed by myself, and Leroy Sea, [place], [place].   Total Time spent with patient: 15 minutes  Psychiatric Specialty Exam: Physical Exam  Constitutional: She is oriented to person,  place, and time. She appears well-developed and well-nourished. No distress.  HENT:  Head: Normocephalic and atraumatic.  Right Ear: External ear normal.  Left Ear: External ear normal.  Eyes: Conjunctivae are normal. Right eye exhibits no discharge. Left eye exhibits no discharge. No scleral icterus.  Respiratory: Effort normal. No respiratory distress.  Musculoskeletal: Normal range of motion.  Neurological: She is alert and oriented to person, place, and time.  Skin: Skin is warm and dry. She is not diaphoretic.  Psychiatric: Her speech is normal. Her mood appears anxious. Her affect is angry. Her affect is not blunt. She is agitated and aggressive. She is not withdrawn and not actively hallucinating. Thought content is not paranoid and not delusional. She expresses impulsivity and inappropriate judgment. She exhibits a depressed mood. She expresses suicidal ideation. She expresses no homicidal ideation. She expresses no suicidal plans. She is attentive.    Review of Systems  Psychiatric/Behavioral: Positive for depression. Negative for hallucinations, memory loss, substance abuse and suicidal ideas. The patient is nervous/anxious and has insomnia.   All other systems reviewed and are negative.   Last menstrual period 06/23/2014.There is no height or weight on file to calculate BMI.  General Appearance: Casual and Well Groomed  Eye Contact:  Good  Speech:  Clear and Coherent and Normal Rate  Volume:  Normal  Mood:  Angry, Anxious, Depressed and Irritable  Affect:  Depressed and Full Range  Thought Process:  Coherent and Goal Directed  Orientation:  Full (Time, Place, and Person)  Thought Content:  Logical, Hallucinations: None  and Rumination  Suicidal Thoughts:  States "I am tired of going through all this  and wish I did not have to." Denies thoughts of suicidide, intent/plan.   Homicidal Thoughts:  No  Memory:  Immediate;   Good Recent;   Good Remote;   Good  Judgement:  Fair   Insight:  Fair  Psychomotor Activity:  Restlessness  Concentration: Concentration: Fair and Attention Span: Fair  Recall:  Good  Fund of Knowledge:Good  Language: Good  Akathisia:  NA  Handed:  Right  AIMS (if indicated):     Assets:  Communication Skills Desire for Improvement Financial Resources/Insurance Housing Intimacy Leisure Time Physical Health Transportation  Sleep:       Musculoskeletal: Strength & Muscle Tone: within normal limits Gait & Station: normal   Last menstrual period 06/23/2014.  Recommendations:  Based on my evaluation the patient does not appear to have an emergency medical condition.   Rozetta Nunnery, NP 09/19/2016, 3:16 AM

## 2016-09-19 NOTE — ED Notes (Signed)
Pt found to be moaning an writhing in bed.  This Probation officer informed Pt the EDP had signed up and would be in shortly.  Attempted to provide comfort measures.  Pt sts "I need something for my nerves.  I went over to [facility] this morning and they sent me over here, because they didn't have beds."  Also, Pt asking to have her estrogen checked.

## 2016-09-19 NOTE — BH Assessment (Addendum)
Assessment Note  [name] is an 56 y.o. female that presents this date with thoughts of self harm but no plan or intent. Patient states "I will say whatever to get what I need" because "I will show all of you." Patient will not elaborate on content of statement and is very agitated. Patient was seen earlier this date and assessed at [facility]. Patient at that time, was asking for medication/s to assist with her anxiety. Per that note earlier this date, "Patient reported being non-compliant with her medication, due to a lapse in her insurance, throughout the previous month. Patient reported attempts to received a prescription from her Primary Care Physician and while in the Emergency room asked for mood stabilizer and other medications, however being denied. Patient stated having S/I, however reported not having a plan or access to weapons. Patient reported no previous attempts at self harm or attempts. Patient denies any H/I or AVH.  Patient reported ongoing depression with symptoms to include: isolation, tearfulness, feelings of worthlessness, loss of interest, and irritability. Patient reported experiences with symptoms of anxiety, such as restlessness, fatigue, difficulty concentrating, irritability, muscle tension, and sleep problems. The patient states that she has been off of her medicines due to some insurance issues. She states that nothing seems make the condition better or worse.  She states that she has been crying uncontrollably. She states that she has not been able to eat or sleep over the last few days. Patient states that nothing seems make the condition better or worse". Patient was recommended to follow up with OP provider and at that time, did not meet inpatient criteria. Patient presented to [facility] a few hours later stating she was informed by [facility] to come to [facility] to "get medications". Patient stated she was not admitted at [facility] because she was informed there "were no beds." Patient is tearful  during assessment stating she "can't get any help" and is demanding "anxiety medications or she will hurt herself." Patient is vague in reference to plan and denies any previous attempts/gestures at self harm. Case was staffed with Romilda Garret NP who recommended patient be re-evaluated in the a.m. since she is expressing thoughts of self harm. Per Romilda Garret NP notes, patient is not to receive opiates or benzodiazepines.     Diagnosis: MDD recurrent without psychotic symptoms, GAD  Past Medical History:  Past Medical History:  Diagnosis Date  . Anxiety   . Arthritis   . Asthma    stress induced asthma  . Chlamydia   . Depression   . Fibroid   . GERD (gastroesophageal reflux disease)   . Hyperlipidemia   . IBS (irritable bowel syndrome)   . Obesity   . Trichomonas   . Tubular adenoma of colon 01/2012    Past Surgical History:  Procedure Laterality Date  . colonscopy  2017  . COLPOSCOPY  1990   results were normal  . IR GENERIC HISTORICAL  03/21/2016   IR EMBO TUMOR ORGAN ISCHEMIA INFARCT INC GUIDE ROADMAPPING 03/21/2016 Arne Cleveland, MD [facility]  . IR GENERIC HISTORICAL  03/21/2016   IR US GUIDE VASC ACCESS RIGHT 03/21/2016 Arne Cleveland, MD [facility]  . IR GENERIC HISTORICAL  03/21/2016   IR ANGIOGRAM SELECTIVE EACH ADDITIONAL VESSEL 03/21/2016 Arne Cleveland, MD [facility]  . IR GENERIC HISTORICAL  03/21/2016   IR ANGIOGRAM SELECTIVE EACH ADDITIONAL VESSEL 03/21/2016 Arne Cleveland, MD [facility]  . IR GENERIC HISTORICAL  03/21/2016   IR ANGIOGRAM PELVIS SELECTIVE OR SUPRASELECTIVE 03/21/2016  Arne Cleveland, MD [facility]  . IR GENERIC HISTORICAL  03/21/2016   IR ANGIOGRAM PELVIS SELECTIVE OR SUPRASELECTIVE 03/21/2016 Arne Cleveland, MD [facility]  . IR GENERIC HISTORICAL  02/27/2016   IR RADIOLOGIST EVAL & MGMT 02/27/2016 Arne Cleveland, MD [facility]  . IR GENERIC HISTORICAL  04/03/2016   IR RADIOLOGIST EVAL & MGMT 04/03/2016 Arne Cleveland, MD [facility]  . labial cyst removal    . TUBAL LIGATION     20 years ago    Family History:  Family History  Problem Relation Age of Onset  . Diabetes Father   . Diabetes Mother   . Hypertension Mother   . Deep vein thrombosis Mother   . Cancer - Other Mother        peritoneal cancer  . Colon cancer Maternal Grandmother   . Colon polyps Maternal Grandmother   . Rectal cancer Neg Hx   . Stomach cancer Neg Hx   . Pancreatic cancer Neg Hx   . Kidney disease Neg Hx   . Liver disease Neg Hx     Social History:  reports that she has never smoked. She has never used smokeless tobacco. She reports that she drinks alcohol. She reports that she does not use drugs.  Additional Social History:  Alcohol / Drug Use Pain Medications: Patient denies Prescriptions: Patient denies Over the Counter: Patient denies History of alcohol / drug use?: No history of alcohol / drug abuse Longest period of sobriety (when/how long): N/A  CIWA: CIWA-Ar BP: (!) 142/84 Pulse Rate: 92 COWS:    Allergies: No Known Allergies  Home Medications:  (Not in a hospital admission)  OB/GYN Status:  Patient's last menstrual period was 06/23/2014.  General Assessment Data [facility] of Assessment: [facility] TTS Assessment: In system Is this a Tele or Face-to-Face Assessment?: Face-to-Face Is this an Initial Assessment or a Re-assessment for this encounter?: Initial Assessment Marital status: Single Maiden name: N/A Is patient pregnant?: No Pregnancy Status: No Living Arrangements: Alone Can pt return to current living arrangement?: Yes Admission Status: Voluntary Is patient capable of signing voluntary admission?: Yes Referral Source: Self/Family/Friend Insurance type: Medicare  Medical Screening Exam ([place]) Medical Exam completed: Yes  Crisis Care Plan Living Arrangements: Alone Legal Guardian:  (NA) Name of Psychiatrist: None Name of Therapist: None  Education Status Is patient  currently in school?: No Current Grade:  (NA) Highest grade of school patient has completed:  (Some college) Name of school: N/A Contact person: N/A  Risk to self with the past 6 months Suicidal Ideation: Yes-Currently Present Has patient been a risk to self within the past 6 months prior to admission? : No Suicidal Intent: Yes-Currently Present Has patient had any suicidal intent within the past 6 months prior to admission? : No Is patient at risk for suicide?: No Suicidal Plan?: No Has patient had any suicidal plan within the past 6 months prior to admission? : No Access to Means: No What has been your use of drugs/alcohol within the last 12 months?:  (Pt denies) Previous Attempts/Gestures: No How many times?: 0 Other Self Harm Risks: Pt denies Triggers for Past Attempts: Unknown Intentional Self Injurious Behavior: None Family Suicide History: No Recent stressful life event(s): Other (Comment) (Excessive anxiety) Persecutory voices/beliefs?: No Depression: Yes Depression Symptoms: Feeling worthless/self pity Substance abuse history and/or treatment for substance abuse?: No Suicide prevention information given to non-admitted patients: Yes  Risk to Others within the past 6 months Homicidal Ideation:  No Does patient have any lifetime risk of violence toward others beyond the six months prior to admission? : No Thoughts of Harm to Others: No Current Homicidal Intent: No Current Homicidal Plan: No Access to Homicidal Means: No Identified Victim: NA History of harm to others?: No Assessment of Violence: None Noted Violent Behavior Description: NA Does patient have access to weapons?: No Criminal Charges Pending?: No Does patient have a court date: No Is patient on probation?: No  Psychosis Hallucinations: None noted Delusions: None noted  Mental Status Report Appearance/Hygiene: In scrubs Eye Contact: Fair Motor Activity: Freedom of movement Speech: Soft,  Slow Level of Consciousness: Crying Mood: Depressed Affect: Anxious Anxiety Level: Moderate Thought Processes: Coherent, Relevant Judgement: Unimpaired Orientation: Person, Place, Time Obsessive Compulsive Thoughts/Behaviors: None  Cognitive Functioning Concentration: Fair Memory: Recent Intact, Remote Intact IQ: Average Insight: Fair Impulse Control: Poor Appetite: Fair Weight Loss: 0 Weight Gain: 0 Sleep: Decreased Total Hours of Sleep: 5 Vegetative Symptoms: None  ADLScreening [facility] Assessment Services) Patient's cognitive ability adequate to safely complete daily activities?: Yes Patient able to express need for assistance with ADLs?: Yes Independently performs ADLs?: Yes (appropriate for developmental age)  Prior Inpatient Therapy Prior Inpatient Therapy: Yes Prior Therapy Dates: [facility], [facility] Prior Therapy Facilty/Provider(s): [facility], [facility] Reason for Treatment: MH issues  Prior Outpatient Therapy Prior Outpatient Therapy: No Prior Therapy Dates:  (NA) Prior Therapy Facilty/Provider(s):  (NA) Reason for Treatment:  (NA) Does patient have an ACCT team?: No Does patient have Intensive In-House Services?  : No Does patient have [facility] services? : No Does patient have P4CC services?: No  ADL Screening (condition at time of admission) Patient's cognitive ability adequate to safely complete daily activities?: Yes Is the patient deaf or have difficulty hearing?: No Does the patient have difficulty seeing, even when wearing glasses/contacts?: No Does the patient have difficulty concentrating, remembering, or making decisions?: No Patient able to express need for assistance with ADLs?: Yes Does the patient have difficulty dressing or bathing?: No Independently performs ADLs?: Yes (appropriate for developmental age) Does the patient have difficulty walking or climbing stairs?: No Weakness of Legs: None Weakness of Arms/Hands: None  Home Assistive Devices/Equipment Home  Assistive Devices/Equipment: None  Therapy Consults (therapy consults require a physician order) PT Evaluation Needed: No OT Evalulation Needed: No SLP Evaluation Needed: No Abuse/Neglect Assessment (Assessment to be complete while patient is alone) Physical Abuse: Denies Verbal Abuse: Denies Sexual Abuse: Denies Exploitation of patient/patient's resources: Denies Self-Neglect: Denies Values / Beliefs Cultural Requests During Hospitalization: None Spiritual Requests During Hospitalization: None Consults Spiritual Care Consult Needed: No Social Work Consult Needed: No Regulatory affairs officer (For Healthcare) Does Patient Have a Medical Advance Directive?: No Would patient like information on creating a medical advance directive?: No - Patient declined    Additional Information 1:1 In Past 12 Months?: No CIRT Risk: No Elopement Risk: No Does patient have medical clearance?: Yes     Disposition: Case was staffed with Romilda Garret NP who recommended patient be re-evaluated in the a.m. since she is expressing thoughts of self harm.      Disposition Initial Assessment Completed for this Encounter: Yes Disposition of Patient: Other dispositions Type of outpatient treatment: Adult Other disposition(s): Other (Comment) (Pt will be re-evaluated in the a.m.)  On Site Evaluation by:   Reviewed with Physician:    Mamie Nick 09/19/2016 4:19 PM

## 2016-09-19 NOTE — BH Assessment (Signed)
[place] Assessment Progress Note  Case was staffed with Romilda Garret NP who recommended patient be re-evaluated in the a.m. since she is expressing thoughts of self harm. Per Romilda Garret NP notes, patient is not to receive opiates or benzodiazepines.

## 2016-09-19 NOTE — ED Triage Notes (Signed)
Pt presents with anorexia and insomnia for past 2 days; off of medications for past month related to insurance issues. Pt verbalizes "hopelessness and guilt" but denies SI/HI or A/VH. Son at bedside verbalizes "just got off work and her friend called me because she was rolling on the floor and saying she wants to hurt herself."

## 2016-09-19 NOTE — ED Provider Notes (Signed)
[place] DEPT Provider Note   CSN: [id number] Arrival date & time: 09/19/16  3976     History   Chief Complaint Chief Complaint  Patient presents with  . Depression    HPI LATAMARA MELDER is a 56 y.o. female.  HPI Patient presents to the emergency department with Depression and suicidal ideation.  The patient states that she has been off of her medicines due to some insurance issues.  She states that nothing seems make the condition better or worse.  She states that she has been crying uncontrollably.  She states that she has not been able to eat or sleep over the last few days.  Patient states that nothing seems make the condition better or worse.  The patient denies chest pain, shortness of breath, headache,blurred vision, neck pain, fever, cough, weakness, numbness, dizziness, anorexia, edema, abdominal pain, nausea, vomiting, diarrhea, rash, back pain, dysuria, hematemesis, bloody stool, near syncope, or syncope. Past Medical History:  Diagnosis Date  . Anxiety   . Arthritis   . Asthma    stress induced asthma  . Chlamydia   . Depression   . Fibroid   . GERD (gastroesophageal reflux disease)   . Hyperlipidemia   . IBS (irritable bowel syndrome)   . Obesity   . Trichomonas   . Tubular adenoma of colon 01/2012    Patient Active Problem List   Diagnosis Date Noted  . Uterine leiomyoma 03/21/2016  . Chest pain 05/31/2012  . Dyslipidemia 05/31/2012  . VAGINAL PRURITUS 01/11/2010  . SINUSITIS, ACUTE 01/04/2010  . EUSTACHIAN TUBE DYSFUNCTION, BILATERAL 07/20/2009  . KNEE PAIN, LEFT 07/20/2009  . LYMPHADENOPATHY 07/20/2009  . TRICHOMONAL VAGINITIS 02/06/2009  . INSOMNIA 02/06/2009  . ANEMIA, MILD 11/16/2008  . GERD 08/30/2008  . DIZZINESS 08/30/2008  . BIPOLAR DISORDER UNSPECIFIED 07/27/2007  . ANXIETY STATE NOS 10/29/2006  . DISORDER, DEPRESSIVE NEC 10/29/2006  . ALLERGIC RHINITIS 10/29/2006  . IRRITABLE BOWEL SYNDROME 10/29/2006  . HYPERCHOLESTEROLEMIA,  MILD 09/01/2005  . DISORDER, LIVER NOS 08/02/2002    Past Surgical History:  Procedure Laterality Date  . colonscopy  2017  . COLPOSCOPY  1990   results were normal  . IR GENERIC HISTORICAL  03/21/2016   IR EMBO TUMOR ORGAN ISCHEMIA INFARCT INC GUIDE ROADMAPPING 03/21/2016 Arne Cleveland, MD [facility]  . IR GENERIC HISTORICAL  03/21/2016   IR US GUIDE VASC ACCESS RIGHT 03/21/2016 Arne Cleveland, MD [facility]  . IR GENERIC HISTORICAL  03/21/2016   IR ANGIOGRAM SELECTIVE EACH ADDITIONAL VESSEL 03/21/2016 Arne Cleveland, MD [facility]  . IR GENERIC HISTORICAL  03/21/2016   IR ANGIOGRAM SELECTIVE EACH ADDITIONAL VESSEL 03/21/2016 Arne Cleveland, MD [facility]  . IR GENERIC HISTORICAL  03/21/2016   IR ANGIOGRAM PELVIS SELECTIVE OR SUPRASELECTIVE 03/21/2016 Arne Cleveland, MD [facility]  . IR GENERIC HISTORICAL  03/21/2016   IR ANGIOGRAM PELVIS SELECTIVE OR SUPRASELECTIVE 03/21/2016 Arne Cleveland, MD [facility]  . IR GENERIC HISTORICAL  02/27/2016   IR RADIOLOGIST EVAL & MGMT 02/27/2016 Arne Cleveland, MD [facility]  . IR GENERIC HISTORICAL  04/03/2016   IR RADIOLOGIST EVAL & MGMT 04/03/2016 Arne Cleveland, MD [facility]  . labial cyst removal    . TUBAL LIGATION     20 years ago    OB History    Gravida Para Term Preterm AB Living   4 2 0 0 2 2   SAB TAB Ectopic Multiple Live Births   2 0  Home Medications    Prior to Admission medications   Medication Sig Start Date End Date Taking? Authorizing Provider  albuterol (PROVENTIL HFA;VENTOLIN HFA) 108 (90 BASE) MCG/ACT inhaler Inhale 2 puffs into the lungs every 6 (six) hours as needed for wheezing.     [provider]  aspirin 81 MG tablet Take 81 mg by mouth daily.      [provider]  azithromycin (ZITHROMAX) 250 MG tablet Take 1 tablet (250 mg total) by mouth daily. Take first 2 tablets together, then 1 every day until finished. Patient not taking: Reported on  08/26/2016 06/22/16   [name], FNP  benzonatate (TESSALON) 100 MG capsule Take 1 capsule (100 mg total) by mouth every 8 (eight) hours. Patient not taking: Reported on 08/26/2016 06/22/16   [name], FNP  Cholecalciferol (VITAMIN D-3 PO) Take 1 capsule by mouth daily.    [provider]  clonazePAM (KLONOPIN) 0.5 MG tablet Take 1 tablet (0.5 mg total) by mouth 3 (three) times daily as needed for anxiety. 09/06/16   Charlann Lange, PA-C  Cyanocobalamin (VITAMIN B-12 PO) Take 1 tablet by mouth daily.    [provider]  dexlansoprazole (DEXILANT) 60 MG capsule Take 1 capsule (60 mg total) by mouth daily. Patient taking differently: Take 60 mg by mouth daily after breakfast.  06/25/16   [name], PA  FLUoxetine (PROZAC) 40 MG capsule Take 40 mg by mouth 2 (two) times daily.  06/19/15   [provider]  fluticasone (FLONASE) 50 MCG/ACT nasal spray Place 2 sprays into both nostrils daily.    [provider]  hyoscyamine (LEVSIN SL) 0.125 MG SL tablet Place 1 tablet (0.125 mg total) under the tongue 3 (three) times daily before meals. 06/25/16   [name], PA  Ibuprofen (MIDOL) 200 MG CAPS Take 400 capsules by mouth every 6 (six) hours as needed (pain). For pain     [provider]  Melatonin 5 MG CAPS Take 5 mg by mouth at bedtime as needed (sleep).  11/27/15   [provider]  methylPREDNISolone (MEDROL DOSEPAK) 4 MG TBPK tablet Take 6-5-4-3-2-1 po qd Patient not taking: Reported on 08/26/2016 06/22/16   [name], FNP  omeprazole (PRILOSEC) 40 MG capsule TAKE 1 CAPSULE (40 MG TOTAL) BY MOUTH 2 (TWO) TIMES DAILY. Patient not taking: Reported on 08/26/2016 02/05/16   Ladene Artist, MD  promethazine (PHENERGAN) 25 MG tablet Take 1 tablet (25 mg total) by mouth every 6 (six) hours as needed for nausea or vomiting. 09/06/16   Charlann Lange, PA-C    Family History Family History  Problem Relation Age of Onset  .  Diabetes Father   . Diabetes Mother   . Hypertension Mother   . Deep vein thrombosis Mother   . Cancer - Other Mother        peritoneal cancer  . Colon cancer Maternal Grandmother   . Colon polyps Maternal Grandmother   . Rectal cancer Neg Hx   . Stomach cancer Neg Hx   . Pancreatic cancer Neg Hx   . Kidney disease Neg Hx   . Liver disease Neg Hx     Social History Social History  Substance Use Topics  . Smoking status: Never Smoker  . Smokeless tobacco: Never Used  . Alcohol use 0.0 oz/week     Comment: occ, not weekly     Allergies   Patient has no known allergies.   Review of Systems Review of  Systems All other systems negative except as documented in the HPI. All pertinent positives and negatives as reviewed in the HPI.  Physical Exam Updated Vital Signs BP (!) 142/84   Pulse 92   Temp 98.3 F (36.8 C) (Oral)   Resp 16   LMP 06/23/2014   SpO2 100%   Physical Exam  Constitutional: She is oriented to person, place, and time. She appears well-developed and well-nourished. No distress.  HENT:  Head: Normocephalic and atraumatic.  Mouth/Throat: Oropharynx is clear and moist.  Eyes: Pupils are equal, round, and reactive to light.  Neck: Normal range of motion. Neck supple.  Cardiovascular: Normal rate, regular rhythm and normal heart sounds.  Exam reveals no gallop and no friction rub.   No murmur heard. Pulmonary/Chest: Effort normal and breath sounds normal. No respiratory distress. She has no wheezes.  Abdominal: Soft. Bowel sounds are normal. She exhibits no distension. There is no tenderness.  Neurological: She is alert and oriented to person, place, and time. She exhibits normal muscle tone. Coordination normal.  Skin: Skin is warm and dry. Capillary refill takes less than 2 seconds. No rash noted. No erythema.  Psychiatric: Her behavior is normal. Her mood appears anxious. Her speech is not rapid and/or pressured and not slurred. She is not actively  hallucinating. Thought content is not paranoid and not delusional. She exhibits a depressed mood. She expresses no homicidal and no suicidal ideation. She expresses no suicidal plans and no homicidal plans.  Nursing note and vitals reviewed.    ED Treatments / Results  Labs (all labs ordered are listed, but only abnormal results are displayed) Labs Reviewed  COMPREHENSIVE METABOLIC PANEL - Abnormal; Notable for the following:       Result Value   Glucose, Bld 119 (*)    All other components within normal limits  ACETAMINOPHEN LEVEL - Abnormal; Notable for the following:    Acetaminophen (Tylenol), Serum <10 (*)    All other components within normal limits  CBC - Abnormal; Notable for the following:    MCV 76.2 (*)    MCH 25.7 (*)    All other components within normal limits  ETHANOL  SALICYLATE LEVEL  RAPID URINE DRUG SCREEN, HOSP PERFORMED    EKG  EKG Interpretation None       Radiology No results found.  Procedures Procedures (including critical care time)  Medications Ordered in ED Medications  LORazepam (ATIVAN) tablet 1 mg (not administered)  LORazepam (ATIVAN) tablet 1 mg (1 mg Oral Given 09/19/16 1022)     Initial Impression / Assessment and Plan / ED Course  I have reviewed the triage vital signs and the nursing notes.  Pertinent labs & imaging results that were available during my care of the patient were reviewed by me and considered in my medical decision making (see chart for details).     I feel that with the patient having suicidal ideation, she will need TTS assessment.   Final Clinical Impressions(s) / ED Diagnoses   Final diagnoses:  None    New Prescriptions New Prescriptions   No medications on file     Dalia Heading, Hershal Coria 09/19/16 [place], MD 09/26/16 [phone]

## 2016-09-19 NOTE — ED Notes (Signed)
Pt oriented to room and unit.  Pt is crying and will not make eye contacts. I am unable to see if she has tears or not.  She states she is here because she is off meds.  Pt contracts for safety.  15 minute checks and video monitoring in place.

## 2016-09-19 NOTE — BH Assessment (Addendum)
Tele Assessment Note   [name] is an 56 y.o.single female, who came into [facility], with her fiance, [name]. Patient reported being non-compliant with her medication, due to a lapse in her insurance, throughout the previous month.   Patient reported currently taking Lexipro and Prozac.  Patient reported attempts to received a prescription from her Primary Care Physician and while in an Emergency room for mood stabilizer and other medications, however being denied.  Patient stated having SIs, however reported not having a plan or access to weapons.  Patient reported no previous SIs, self-injurious behaviors, or attempts.  Patient denies HI/AVH/SA or access to weapons.  Patient reported experiences with depressive symptoms, such as fatigue, insomnia, isolation, tearfulness, feelings of worthlessness, loss of interest, and irritability.  Patient reported experiences with symptoms of anxiety, such as restlessness, fatigue, difficulty concentrating, irritability, muscle tension, and sleep problems.    Per Patient's Fiance, [name]: Patient appeared to be anxious throughout the previous month.  Patient experienced the death of an aunt during the previous week.  Patient was unable to sleep, resulting in her often pacing within the home.   Patient identified supportive factors from her fiance.  Patient reported being currently unemployed and receiving disability insurance.  Patient reported previous inpatient treatment at [place] (04/2010) [facility] (09/12).  Patient stated that she received no therapy services from providers due to the lapse in her insurance, however recently attempted to begin services after her insurance was re-instated.    During assessment, Patient was responsive and cooperative.  Patient was appropriately dressed.  Patient was oriented to the time, place, person, and situation.  Patient was unable to make eye-contact, due to laying her head on the table and  communicating.  Patient's speech was logical, coherent, and loud.  Patient appeared to be depressed, anxious, and sad. Patient reported wanting to receive inpatient treatment to obtain needed medications.     Diagnosis: Major Depressive Disorder, recurrent, moderate Generalized Anxiety Disorder  Past Medical History:  Past Medical History:  Diagnosis Date  . Anxiety   . Arthritis   . Asthma    stress induced asthma  . Chlamydia   . Depression   . Fibroid   . GERD (gastroesophageal reflux disease)   . Hyperlipidemia   . IBS (irritable bowel syndrome)   . Obesity   . Trichomonas   . Tubular adenoma of colon 01/2012    Past Surgical History:  Procedure Laterality Date  . colonscopy  2017  . COLPOSCOPY  1990   results were normal  . IR GENERIC HISTORICAL  03/21/2016   IR EMBO TUMOR ORGAN ISCHEMIA INFARCT INC GUIDE ROADMAPPING 03/21/2016 Arne Cleveland, MD [facility]  . IR GENERIC HISTORICAL  03/21/2016   IR US GUIDE VASC ACCESS RIGHT 03/21/2016 Arne Cleveland, MD [facility]  . IR GENERIC HISTORICAL  03/21/2016   IR ANGIOGRAM SELECTIVE EACH ADDITIONAL VESSEL 03/21/2016 Arne Cleveland, MD [facility]  . IR GENERIC HISTORICAL  03/21/2016   IR ANGIOGRAM SELECTIVE EACH ADDITIONAL VESSEL 03/21/2016 Arne Cleveland, MD [facility]  . IR GENERIC HISTORICAL  03/21/2016   IR ANGIOGRAM PELVIS SELECTIVE OR SUPRASELECTIVE 03/21/2016 Arne Cleveland, MD [facility]  . IR GENERIC HISTORICAL  03/21/2016   IR ANGIOGRAM PELVIS SELECTIVE OR SUPRASELECTIVE 03/21/2016 Arne Cleveland, MD [facility]  . IR GENERIC HISTORICAL  02/27/2016   IR RADIOLOGIST EVAL & MGMT 02/27/2016 Arne Cleveland, MD [facility]  . IR GENERIC HISTORICAL  04/03/2016   IR  RADIOLOGIST EVAL & MGMT 04/03/2016 Arne Cleveland, MD [facility]  . labial cyst removal    . TUBAL LIGATION     20 years ago    Family History:  Family History  Problem Relation Age of Onset  . Diabetes Father   . Diabetes  Mother   . Hypertension Mother   . Deep vein thrombosis Mother   . Cancer - Other Mother        peritoneal cancer  . Colon cancer Maternal Grandmother   . Colon polyps Maternal Grandmother   . Rectal cancer Neg Hx   . Stomach cancer Neg Hx   . Pancreatic cancer Neg Hx   . Kidney disease Neg Hx   . Liver disease Neg Hx     Social History:  reports that she has never smoked. She has never used smokeless tobacco. She reports that she drinks alcohol. She reports that she does not use drugs.  Additional Social History:  Alcohol / Drug Use Pain Medications: Patient denies Prescriptions: Patient denies Over the Counter: Patient denies History of alcohol / drug use?: No history of alcohol / drug abuse (Patient denies) Longest period of sobriety (when/how long): N/A  CIWA:   COWS:    PATIENT STRENGTHS: (choose at least two) Ability for insight Average or above average intelligence Capable of independent living Communication skills Motivation for treatment/growth Supportive family/friends  Allergies: No Known Allergies  Home Medications:  (Not in a hospital admission)  OB/GYN Status:  Patient's last menstrual period was 06/23/2014.  General Assessment Data Location of Assessment: [facility] TTS Assessment: In system Is this a Tele or Face-to-Face Assessment?: Face-to-Face Is this an Initial Assessment or a Re-assessment for this encounter?: Initial Assessment Marital status: Single Maiden name: N/A Is patient pregnant?: No Pregnancy Status: No Living Arrangements: Alone Can pt return to current living arrangement?: Yes Admission Status: Voluntary Is patient capable of signing voluntary admission?: Yes Referral Source: Self/Family/Friend Insurance type: Medicare  Medical Screening Exam ([place]) Medical Exam completed: Yes  Crisis Care Plan Living Arrangements: Alone Legal Guardian: Other: (Self) Name of Psychiatrist: None Name of Therapist:  None  Education Status Is patient currently in school?: No Current Grade: N/A Highest grade of school patient has completed: Some college Name of school: N/A Contact person: N/A  Risk to self with the past 6 months Suicidal Ideation: Yes-Currently Present Has patient been a risk to self within the past 6 months prior to admission? : No Suicidal Intent: Yes-Currently Present Has patient had any suicidal intent within the past 6 months prior to admission? : No Is patient at risk for suicide?: No Suicidal Plan?: No-Not Currently/Within Last 6 Months (Pt. denies) Has patient had any suicidal plan within the past 6 months prior to admission? : No Access to Means: No (Pt. denies) What has been your use of drugs/alcohol within the last 12 months?: Pt. denies Previous Attempts/Gestures: No (Patient denies) How many times?: 0 Other Self Harm Risks: Patient denies Triggers for Past Attempts: None known Intentional Self Injurious Behavior: None (Patient denies) Family Suicide History: No (Patient denies) Recent stressful life event(s): Other (Comment) (Death of aunt) Persecutory voices/beliefs?: No Depression: Yes Depression Symptoms: Despondent, Insomnia, Tearfulness, Isolating, Fatigue, Guilt, Loss of interest in usual pleasures, Feeling worthless/self pity, Feeling angry/irritable Substance abuse history and/or treatment for substance abuse?: No (Patient denies) Suicide prevention information given to non-admitted patients: Yes  Risk to Others within the past 6 months Homicidal Ideation: No (Patient denies) Does  patient have any lifetime risk of violence toward others beyond the six months prior to admission? : No (Patient denies) Thoughts of Harm to Others: No (Patient denies) Current Homicidal Intent: No (Patient denies) Current Homicidal Plan: No (Patient denies) Access to Homicidal Means: No (Patient denies) Identified Victim: None History of harm to others?: No Assessment of  Violence: On admission Violent Behavior Description: None Does patient have access to weapons?: No (Patient denies) Criminal Charges Pending?: No (Patient denies) Does patient have a court date: No (Patient denies) Is patient on probation?: No (Patient denies)  Psychosis Hallucinations: None noted (Patient denies) Delusions: None noted  Mental Status Report Appearance/Hygiene: Other (Comment) (Appropriate) Eye Contact: Poor Motor Activity: Freedom of movement, Restlessness Speech: Loud, Other (Comment), Logical/coherent Level of Consciousness: Alert, Crying, Irritable Mood: Depressed, Anxious, Sad Affect: Appropriate to circumstance, Depressed, Anxious Anxiety Level: Moderate Thought Processes: Coherent, Relevant, Circumstantial Judgement: Unimpaired Orientation: Person, Place, Time, Situation Obsessive Compulsive Thoughts/Behaviors: None  Cognitive Functioning Concentration: Fair Memory: Recent Intact, Remote Intact IQ: Average Insight: Fair Impulse Control: Good Appetite: Good Weight Loss: 0 Weight Gain: 0 Sleep: Decreased Total Hours of Sleep: 0 (Pt. reported not sleeping: Unable to provide total) Vegetative Symptoms: None  ADLScreening [facility] Assessment Services) Patient's cognitive ability adequate to safely complete daily activities?: Yes Patient able to express need for assistance with ADLs?: Yes Independently performs ADLs?: Yes (appropriate for developmental age)  Prior Inpatient Therapy Prior Inpatient Therapy: Yes Prior Therapy Dates: [place] [facility] - 02/2012[facility] - 11/2010 Prior Therapy Facilty/Provider(s): [place] [facility]; [facility] Reason for Treatment: Depression  Prior Outpatient Therapy Prior Outpatient Therapy: No Prior Therapy Dates: None Prior Therapy Facilty/Provider(s): None Reason for Treatment: None Does patient have an ACCT team?: No Does patient have Intensive In-House Services?  : No Does patient have [facility] services? : No Does  patient have P4[facility] services?: No  ADL Screening (condition at time of admission) Patient's cognitive ability adequate to safely complete daily activities?: Yes Is the patient deaf or have difficulty hearing?: No Does the patient have difficulty seeing, even when wearing glasses/contacts?: No Does the patient have difficulty concentrating, remembering, or making decisions?: No Patient able to express need for assistance with ADLs?: Yes Does the patient have difficulty dressing or bathing?: No Independently performs ADLs?: Yes (appropriate for developmental age) Does the patient have difficulty walking or climbing stairs?: No Weakness of Legs: None Weakness of Arms/Hands: None  Home Assistive Devices/Equipment Home Assistive Devices/Equipment: None    Abuse/Neglect Assessment (Assessment to be complete while patient is alone) Physical Abuse: Denies Verbal Abuse: Denies Sexual Abuse: Denies Exploitation of patient/patient's resources: Denies Self-Neglect: Denies     Regulatory affairs officer (For Healthcare) Does Patient Have a Medical Advance Directive?: No Would patient like information on creating a medical advance directive?: No - Patient declined    Additional Information 1:1 In Past 12 Months?: No CIRT Risk: No Elopement Risk: No Does patient have medical clearance?: Yes     Disposition:  Disposition Initial Assessment Completed for this Encounter: Yes Disposition of Patient: Outpatient treatment (Per Lindon Romp, NP) Type of outpatient treatment: Adult  Marcine Matar 09/19/2016 3:35 AM

## 2016-09-20 DIAGNOSIS — F4322 Adjustment disorder with anxiety: Secondary | ICD-10-CM | POA: Diagnosis present

## 2016-09-20 MED ORDER — HYDROXYZINE HCL 25 MG PO TABS: 25.0000 mg | ORAL_TABLET | Freq: Two times a day (BID) | ORAL | Status: DC

## 2016-09-20 MED ORDER — HYDROXYZINE HCL 25 MG PO TABS: 25.0000 mg | ORAL_TABLET | Freq: Two times a day (BID) | ORAL | 0 refills | Status: DC

## 2016-09-20 MED ORDER — TRAZODONE HCL 100 MG PO TABS: 100.0000 mg | ORAL_TABLET | Freq: Every day | ORAL | Status: DC

## 2016-09-20 NOTE — ED Notes (Signed)
Up to the bathroom 

## 2016-09-20 NOTE — ED Notes (Signed)
Hourly rounding reveals patient sleeping in room. No complaints, stable, in no acute distress. Q15 minute rounds and monitoring via Security Cameras to continue. 

## 2016-09-20 NOTE — ED Notes (Signed)
In room resting quielty, nad

## 2016-09-20 NOTE — ED Notes (Signed)
Pt talking w/ boyfriend, still tearful, calmer,  But still frustrated about not being able to find a provider to help with her medications.

## 2016-09-20 NOTE — ED Notes (Addendum)
Calm, talking w/ boyfriend.  Pt is aware that she is being dc'd.  Follow up discussed.

## 2016-09-20 NOTE — Discharge Instructions (Signed)
For your ongoing mental health needs, you are advised to follow up with [facility].  New and returning patients are seen at their walk-in clinic.  Walk-in hours are Monday - Friday from 8:00 am - 3:00 pm.  Walk-in patients are seen on a first come, first served basis.  Try to arrive as early as possible for he best chance of being seen the same day: ° °     [facility] °     [address] °     [place], [place] [address] °     [phone] °

## 2016-09-20 NOTE — BHH Suicide Risk Assessment (Cosign Needed)
Suicide Risk Assessment  Discharge Assessment   [facility] Discharge Suicide Risk Assessment   Principal Problem: Adjustment disorder with anxiety Discharge Diagnoses:  Patient Active Problem List   Diagnosis Date Noted  . BIPOLAR DISORDER UNSPECIFIED [F31.9] 07/27/2007    Priority: High  . Adjustment disorder with anxiety [F43.22] 09/20/2016  . Uterine leiomyoma [D25.9] 03/21/2016  . Chest pain [R07.9] 05/31/2012  . Dyslipidemia [E78.5] 05/31/2012  . VAGINAL PRURITUS [L29.3] 01/11/2010  . SINUSITIS, ACUTE [J01.90] 01/04/2010  . EUSTACHIAN TUBE DYSFUNCTION, BILATERAL [H69.80] 07/20/2009  . KNEE PAIN, LEFT [M25.569] 07/20/2009  . LYMPHADENOPATHY [R59.9] 07/20/2009  . TRICHOMONAL VAGINITIS [A59.01] 02/06/2009  . INSOMNIA [G47.00] 02/06/2009  . ANEMIA, MILD [D64.9] 11/16/2008  . GERD [K21.9] 08/30/2008  . DIZZINESS [R42] 08/30/2008  . ANXIETY STATE NOS [F41.1] 10/29/2006  . DISORDER, DEPRESSIVE NEC [F32.9] 10/29/2006  . ALLERGIC RHINITIS [J30.9] 10/29/2006  . IRRITABLE BOWEL SYNDROME [K58.9] 10/29/2006  . HYPERCHOLESTEROLEMIA, MILD [E78.00] 09/01/2005  . DISORDER, LIVER NOS [K76.9] 08/02/2002    Total Time spent with patient: 30 minutes    Musculoskeletal: Strength & Muscle Tone: within normal limits Gait & Station: normal Patient leans: N/A  Psychiatric Specialty Exam: Physical Exam  Nursing note and vitals reviewed.   ROS  Blood pressure 134/79, pulse 81, temperature 98.6 F (37 C), temperature source Oral, resp. rate 18, last menstrual period 06/23/2014, SpO2 98 %.There is no height or weight on file to calculate BMI.  General Appearance: Casual  Eye Contact:  Minimal  Speech:  Pressured  Volume:  Increased  Mood:  Anxious and Irritable  Affect:  Blunt and Labile  Thought Process:  Coherent  Orientation:  Full (Time, Place, and Person)  Thought Content:  Rumination  Suicidal Thoughts:  No  Homicidal Thoughts:  No  Memory:  Immediate;   Fair Recent;   Fair Remote;    Fair  Judgement:  Poor  Insight:  Shallow  Psychomotor Activity:  Normal  Concentration:  Concentration: Fair and Attention Span: Fair  Recall:  [employer] of Knowledge:  Fair  Language:  Fair  Akathisia:  No  Handed:  Right  AIMS (if indicated):     Assets:  Communication Skills Resilience  ADL's:  Intact  Cognition:  WNL  Sleep:       Mental Status Per Nursing Assessment::   On Admission:     Demographic Factors:  Low socioeconomic status  Loss Factors: Decline in physical health, Legal issues and Financial problems/change in socioeconomic status  Historical Factors: Impulsivity and substance abuse  Risk Reduction Factors:   Employed  Continued Clinical Symptoms:  Severe Anxiety and/or Agitation Alcohol/Substance Abuse/Dependencies  Cognitive Features That Contribute To Risk:  Closed-mindedness and Thought constriction (tunnel vision)    Suicide Risk:  Minimal: No identifiable suicidal ideation.  Patients presenting with no risk factors but with morbid ruminations; may be classified as minimal risk based on the severity of the depressive symptoms    Plan Of Care/Follow-up recommendations:  Activity:  as tol Diet:  as tol Other:  Referral followed up at [facility] [place] 09/20/2016, 12:09 PM

## 2016-09-20 NOTE — ED Notes (Addendum)
Written dc instructions and prescription reviewed with pt.  Pt encouraged to contact OP resources on Monday, and take her medications as directed.  Pt encouraged to return/seek treatment for return or changes in suicidal thoughts.  Pt verbalized understanding and reported she would do so.  Pt  Ambulatory to dc are w/o difficulty, belongings returned after leaving the area.  Pt's boyfriend is here to pick her up.

## 2016-09-20 NOTE — ED Notes (Signed)
Sleeping, easily aroused

## 2016-09-20 NOTE — ED Notes (Signed)
Hourly rounding reveals patient in sleeping room. No complaints, stable, in no acute distress. Q15 minute rounds and monitoring via Security Cameras to continue. 

## 2016-09-20 NOTE — Consult Note (Signed)
[facility] Face-to-Face Psychiatry Consult   Reason for Consult:  anxious, wanting Klonopin  Referring Physician:  EDP Patient Identification: [name] MRN:  [id number] Principal Diagnosis:  Diagnosis:   Patient Active Problem List   Diagnosis Date Noted  . BIPOLAR DISORDER UNSPECIFIED [F31.9] 07/27/2007    Priority: High  . Uterine leiomyoma [D25.9] 03/21/2016  . Chest pain [R07.9] 05/31/2012  . Dyslipidemia [E78.5] 05/31/2012  . VAGINAL PRURITUS [L29.3] 01/11/2010  . SINUSITIS, ACUTE [J01.90] 01/04/2010  . EUSTACHIAN TUBE DYSFUNCTION, BILATERAL [H69.80] 07/20/2009  . KNEE PAIN, LEFT [M25.569] 07/20/2009  . LYMPHADENOPATHY [R59.9] 07/20/2009  . TRICHOMONAL VAGINITIS [A59.01] 02/06/2009  . INSOMNIA [G47.00] 02/06/2009  . ANEMIA, MILD [D64.9] 11/16/2008  . GERD [K21.9] 08/30/2008  . DIZZINESS [R42] 08/30/2008  . ANXIETY STATE NOS [F41.1] 10/29/2006  . DISORDER, DEPRESSIVE NEC [F32.9] 10/29/2006  . ALLERGIC RHINITIS [J30.9] 10/29/2006  . IRRITABLE BOWEL SYNDROME [K58.9] 10/29/2006  . HYPERCHOLESTEROLEMIA, MILD [E78.00] 09/01/2005  . DISORDER, LIVER NOS [K76.9] 08/02/2002    Total Time spent with patient: 30 minutes  Subjective:   [name] is a 57 y.o. female patient admitted with hopelessness and expressing wanting to hurt herself if she does not get Clonazepam.  HPI:  [name] is an 56 y.o. female who presents with her [name], as a walk-in patient.  Background information includes that patient presented to [facility] earlier requesting for Benzodiazepine, but left prior to checking-in and was told that the [facility] was going to be able to give her anxiety.  Patient was seen this am, her fiancee in the room with her.  Patient reports that she was receiving Klonopin from Dr. Omelia Blackwater but has since been weaned off since she lost her Medicaid. She states that she has history of anxiety and depression that can only be treated by taking Klonopin. She reports  her attempts to get Klonopin from [facility] and [facility] with no success: ''they told me I am addicted to it''.   Patient states that her PCP would not give her Klonopin.  Additionally, she states that "I have been to the emergency room twice recently and they will not give me my medication either."  Patient reported to [facility] walk in before coming to the ED that she did not want to to be treated inpatient,and that she just wanted to be back on her Klonopin.  Patient continues to ask for medications in particular Klonopin.  Patient is irate and states that we are not helping her and that if she does not get her Klonopin she does not know what else to do.  Patient denied suicide thoughts or homicidal thoughts.  No paranoia c/o.  "I just want to be on Klonopin."  Past Psychiatric History: see HPI  Risk to Self: Suicidal Ideation: Not Currently  Suicidal Intent: Not Currently Present Is patient at risk for suicide?: No Suicidal Plan?: No Access to Means: No What has been your use of drugs/alcohol within the last 12 months?:  (Pt denies) How many times?: 0 Other Self Harm Risks: Pt denies Triggers for Past Attempts: Unknown Intentional Self Injurious Behavior: None Risk to Others: Homicidal Ideation: No Thoughts of Harm to Others: No Current Homicidal Intent: No Current Homicidal Plan: No Access to Homicidal Means: No Identified Victim: NA History of harm to others?: No Assessment of Violence: None Noted Violent Behavior Description: NA Does patient have access to weapons?: No Criminal Charges Pending?: No Does patient have a court date: No Prior Inpatient Therapy: Prior Inpatient  Therapy: Yes Prior Therapy Dates: [facility], [facility] Prior Therapy Facilty/Provider(s): [facility], [place] Reason for Treatment: MH issues Prior Outpatient Therapy: Prior Outpatient Therapy: No Prior Therapy Dates:  (NA) Prior Therapy Facilty/Provider(s):  (NA) Reason for Treatment:  (NA) Does patient have an ACCT team?:  No Does patient have Intensive In-House Services?  : No Does patient have [facility] services? : No Does patient have P4CC services?: No  Past Medical History:  Past Medical History:  Diagnosis Date  . Anxiety   . Arthritis   . Asthma    stress induced asthma  . Chlamydia   . Depression   . Fibroid   . GERD (gastroesophageal reflux disease)   . Hyperlipidemia   . IBS (irritable bowel syndrome)   . Obesity   . Trichomonas   . Tubular adenoma of colon 01/2012    Past Surgical History:  Procedure Laterality Date  . colonscopy  2017  . COLPOSCOPY  1990   results were normal  . IR GENERIC HISTORICAL  03/21/2016   IR EMBO TUMOR ORGAN ISCHEMIA INFARCT INC GUIDE ROADMAPPING 03/21/2016 Arne Cleveland, MD [facility]  . IR GENERIC HISTORICAL  03/21/2016   IR US GUIDE VASC ACCESS RIGHT 03/21/2016 Arne Cleveland, MD [facility]  . IR GENERIC HISTORICAL  03/21/2016   IR ANGIOGRAM SELECTIVE EACH ADDITIONAL VESSEL 03/21/2016 Arne Cleveland, MD [facility]  . IR GENERIC HISTORICAL  03/21/2016   IR ANGIOGRAM SELECTIVE EACH ADDITIONAL VESSEL 03/21/2016 Arne Cleveland, MD [facility]  . IR GENERIC HISTORICAL  03/21/2016   IR ANGIOGRAM PELVIS SELECTIVE OR SUPRASELECTIVE 03/21/2016 Arne Cleveland, MD [facility]  . IR GENERIC HISTORICAL  03/21/2016   IR ANGIOGRAM PELVIS SELECTIVE OR SUPRASELECTIVE 03/21/2016 Arne Cleveland, MD [facility]  . IR GENERIC HISTORICAL  02/27/2016   IR RADIOLOGIST EVAL & MGMT 02/27/2016 Arne Cleveland, MD [facility]  . IR GENERIC HISTORICAL  04/03/2016   IR RADIOLOGIST EVAL & MGMT 04/03/2016 Arne Cleveland, MD [facility]  . labial cyst removal    . TUBAL LIGATION     20 years ago   Family History:  Family History  Problem Relation Age of Onset  . Diabetes Father   . Diabetes Mother   . Hypertension Mother   . Deep vein thrombosis Mother   . Cancer - Other Mother        peritoneal cancer  . Colon cancer Maternal Grandmother   . Colon  polyps Maternal Grandmother   . Rectal cancer Neg Hx   . Stomach cancer Neg Hx   . Pancreatic cancer Neg Hx   . Kidney disease Neg Hx   . Liver disease Neg Hx    Family Psychiatric  History: see HPI Social History:  History  Alcohol Use  . 0.0 oz/week    Comment: occ, not weekly     History  Drug Use No    Social History   Social History  . Marital status: Single    Spouse name: N/A  . Number of children: 2  . Years of education: N/A   Social History Main Topics  . Smoking status: Never Smoker  . Smokeless tobacco: Never Used  . Alcohol use 0.0 oz/week     Comment: occ, not weekly  . Drug use: No  . Sexual activity: Not Asked   Other Topics Concern  . None   Social History Narrative  . None   Additional Social History:    Allergies:  No Known Allergies  Labs:  Results for  orders placed or performed during the hospital encounter of 09/19/16 (from the past 48 hour(s))  Comprehensive metabolic panel     Status: Abnormal   Collection Time: 09/19/16  8:52 AM  Result Value Ref Range   Sodium 141 135 - 145 mmol/L   Potassium 3.8 3.5 - 5.1 mmol/L   Chloride 107 101 - 111 mmol/L   CO2 25 22 - 32 mmol/L   Glucose, Bld 119 (H) 65 - 99 mg/dL   BUN 9 6 - 20 mg/dL   Creatinine, Ser 0.85 0.44 - 1.00 mg/dL   Calcium 9.2 8.9 - 10.3 mg/dL   Total Protein 7.8 6.5 - 8.1 g/dL   Albumin 4.4 3.5 - 5.0 g/dL   AST 21 15 - 41 U/L   ALT 16 14 - 54 U/L   Alkaline Phosphatase 76 38 - 126 U/L   Total Bilirubin 0.6 0.3 - 1.2 mg/dL   GFR calc non Af Amer >60 >60 mL/min   GFR calc Af Amer >60 >60 mL/min    Comment: (NOTE) The eGFR has been calculated using the CKD EPI equation. This calculation has not been validated in all clinical situations. eGFR's persistently <60 mL/min signify possible Chronic Kidney Disease.    Anion gap 9 5 - 15  Ethanol     Status: None   Collection Time: 09/19/16  8:52 AM  Result Value Ref Range   Alcohol, Ethyl (B) <5 <5 mg/dL    Comment:         LOWEST DETECTABLE LIMIT FOR SERUM ALCOHOL IS 5 mg/dL FOR MEDICAL PURPOSES ONLY   Salicylate level     Status: None   Collection Time: 09/19/16  8:52 AM  Result Value Ref Range   Salicylate Lvl <4.6 2.8 - 30.0 mg/dL  Acetaminophen level     Status: Abnormal   Collection Time: 09/19/16  8:52 AM  Result Value Ref Range   Acetaminophen (Tylenol), Serum <10 (L) 10 - 30 ug/mL    Comment:        THERAPEUTIC CONCENTRATIONS VARY SIGNIFICANTLY. A RANGE OF 10-30 ug/mL MAY BE AN EFFECTIVE CONCENTRATION FOR MANY PATIENTS. HOWEVER, SOME ARE BEST TREATED AT CONCENTRATIONS OUTSIDE THIS RANGE. ACETAMINOPHEN CONCENTRATIONS >150 ug/mL AT 4 HOURS AFTER INGESTION AND >50 ug/mL AT 12 HOURS AFTER INGESTION ARE OFTEN ASSOCIATED WITH TOXIC REACTIONS.   cbc     Status: Abnormal   Collection Time: 09/19/16  8:52 AM  Result Value Ref Range   WBC 6.7 4.0 - 10.5 K/uL   RBC 4.83 3.87 - 5.11 MIL/uL   Hemoglobin 12.4 12.0 - 15.0 g/dL   HCT 36.8 36.0 - 46.0 %   MCV 76.2 (L) 78.0 - 100.0 fL   MCH 25.7 (L) 26.0 - 34.0 pg   MCHC 33.7 30.0 - 36.0 g/dL   RDW 14.4 11.5 - 15.5 %   Platelets 225 150 - 400 K/uL  Rapid urine drug screen (hospital performed)     Status: None   Collection Time: 09/19/16  8:59 AM  Result Value Ref Range   Opiates NONE DETECTED NONE DETECTED   Cocaine NONE DETECTED NONE DETECTED   Benzodiazepines NONE DETECTED NONE DETECTED   Amphetamines NONE DETECTED NONE DETECTED   Tetrahydrocannabinol NONE DETECTED NONE DETECTED   Barbiturates NONE DETECTED NONE DETECTED    Comment:        DRUG SCREEN FOR MEDICAL PURPOSES ONLY.  IF CONFIRMATION IS NEEDED FOR ANY PURPOSE, NOTIFY LAB WITHIN 5 DAYS.        LOWEST  DETECTABLE LIMITS FOR URINE DRUG SCREEN Drug Class       Cutoff (ng/mL) Amphetamine      1000 Barbiturate      200 Benzodiazepine   951 Tricyclics       884 Opiates          300 Cocaine          300 THC              50     Current Facility-Administered Medications   Medication Dose Route Frequency Provider Last Rate Last Dose  . hydrOXYzine (ATARAX/VISTARIL) tablet 25 mg  25 mg Oral BID Sreya Froio, MD      . hydrOXYzine (ATARAX/VISTARIL) tablet 50 mg  50 mg Oral Q6H PRN Ethelene Hal, NP   50 mg at 09/20/16 1038  . traZODone (DESYREL) tablet 100 mg  100 mg Oral QHS Corena Pilgrim, MD       Current Outpatient Prescriptions  Medication Sig Dispense Refill  . albuterol (PROVENTIL HFA;VENTOLIN HFA) 108 (90 BASE) MCG/ACT inhaler Inhale 2 puffs into the lungs every 6 (six) hours as needed for wheezing.     . Ascorbic Acid (VITAMIN C) 1000 MG tablet Take 3,000 mg by mouth daily.    Marland Kitchen aspirin 81 MG tablet Take 81 mg by mouth daily.      . Cyanocobalamin (VITAMIN B-12 PO) Take 1 tablet by mouth daily.    Marland Kitchen dexlansoprazole (DEXILANT) 60 MG capsule Take 1 capsule (60 mg total) by mouth daily. (Patient taking differently: Take 60 mg by mouth daily after breakfast. ) 30 capsule 3  . escitalopram (LEXAPRO) 20 MG tablet Take 20 mg by mouth daily.  3  . FLUoxetine (PROZAC) 20 MG tablet Take 20 mg by mouth daily.  0  . fluticasone (FLONASE) 50 MCG/ACT nasal spray Place 2 sprays into both nostrils daily.    . Melatonin 5 MG CAPS Take 5 mg by mouth at bedtime as needed (sleep).     . promethazine (PHENERGAN) 25 MG tablet Take 1 tablet (25 mg total) by mouth every 6 (six) hours as needed for nausea or vomiting. (Patient taking differently: Take 50 mg by mouth every 6 (six) hours as needed for nausea or vomiting. ) 10 tablet 0  . clonazePAM (KLONOPIN) 0.5 MG tablet Take 1 tablet (0.5 mg total) by mouth 3 (three) times daily as needed for anxiety. (Patient not taking: Reported on 09/19/2016) 6 tablet 0   Facility-Administered Medications Ordered in Other Encounters  Medication Dose Route Frequency Provider Last Rate Last Dose  . diphenhydrAMINE (BENADRYL) injection 12.5 mg  12.5 mg Intravenous Q6H PRN Arne Cleveland, MD       Or  . diphenhydrAMINE (BENADRYL)  12.5 MG/5ML elixir 12.5 mg  12.5 mg Oral Q6H PRN Arne Cleveland, MD      . HYDROmorphone (DILAUDID) 1 mg/mL PCA injection   Intravenous Q4H Arne Cleveland, MD      . naloxone [facility]) injection 0.4 mg  0.4 mg Intravenous PRN Arne Cleveland, MD       And  . sodium chloride flush (NS) 0.9 % injection 9 mL  9 mL Intravenous PRN Arne Cleveland, MD      . ondansetron [facility]) injection 4 mg  4 mg Intravenous Q6H PRN Arne Cleveland, MD        Musculoskeletal: Strength & Muscle Tone: within normal limits Gait & Station: normal Patient leans: N/A  Psychiatric Specialty Exam: Physical Exam  Nursing note and vitals reviewed.  ROS  Blood pressure 134/79, pulse 81, temperature 98.6 F (37 C), temperature source Oral, resp. rate 18, last menstrual period 06/23/2014, SpO2 98 %.There is no height or weight on file to calculate BMI.  General Appearance: Casual  Eye Contact:  good  Speech:  Clear and Coherent  Volume:  Increased  Mood:  Irritable  Affect:  Labile  Thought Process:  Coherent and Descriptions of Associations: Intact  Orientation:  Full (Time, Place, and Person)  Thought Content:  Logical  Suicidal Thoughts:  No  Homicidal Thoughts:  No  Memory:  Immediate;   Fair Recent;   Fair Remote;   Fair  Judgement:  Other:  marginal  Insight:  Shallow  Psychomotor Activity:  Normal  Concentration:  Concentration: Fair and Attention Span: Fair  Recall:  [employer] of Knowledge:  Fair  Language:  Fair  Akathisia:  No  Handed:  Right  AIMS (if indicated):     Assets:  Communication Skills Resilience  ADL's:  Intact  Cognition:  WNL  Sleep:  poor   Treatment Plan Summary: Plan Discharge, refer to [facility]  Disposition: No evidence of imminent risk to self or others at present.   Patient does not meet criteria for psychiatric inpatient admission. Supportive therapy provided about ongoing stressors. Discussed crisis plan, support from social network, calling 911, coming to  the Emergency Department, and calling Suicide Hotline. Referred to [facility], NP [facility] 09/20/2016 11:29 AM  Patient seen face-to-face for psychiatric evaluation, chart reviewed and case discussed with the physician extender and developed treatment plan. Reviewed the information documented and agree with the treatment plan. Corena Pilgrim, MD

## 2016-10-02 ENCOUNTER — Telehealth: Payer: Self-pay | Admitting: Gastroenterology

## 2016-10-02 ENCOUNTER — Encounter: Payer: Self-pay | Admitting: Gastroenterology

## 2016-10-02 ENCOUNTER — Ambulatory Visit (INDEPENDENT_AMBULATORY_CARE_PROVIDER_SITE_OTHER): Payer: Medicare HMO | Admitting: Gastroenterology

## 2016-10-02 VITALS — BP 120/78 | HR 114 | Ht 66.75 in | Wt 220.0 lb

## 2016-10-02 DIAGNOSIS — K582 Mixed irritable bowel syndrome: Secondary | ICD-10-CM

## 2016-10-02 DIAGNOSIS — K219 Gastro-esophageal reflux disease without esophagitis: Secondary | ICD-10-CM

## 2016-10-02 MED ORDER — HYOSCYAMINE SULFATE 0.125 MG SL SUBL: 0.1250 mg | SUBLINGUAL_TABLET | Freq: Three times a day (TID) | SUBLINGUAL | 5 refills | Status: DC

## 2016-10-02 NOTE — Patient Instructions (Signed)
We have sent the following medications to your pharmacy for you to pick up at your convenience: Levsin. If your insurance does not cover this medication, have your pharmacy fax [name] a prior authorization.  Thank you for choosing me and [facility].  Pricilla Riffle. Dagoberto Ligas., MD., Marval Regal

## 2016-10-02 NOTE — Telephone Encounter (Signed)
Is this ok to refill Dr. Fuller Plan?

## 2016-10-02 NOTE — Progress Notes (Signed)
    History of Present Illness: This is a 56 year old female returning for follow-up of abdominal pain and alternating constipation and diarrhea. Over the past few weeks she has only had mild diarrhea and crampy lower abdominal pain. Hyoscyamine has been very effective in controlling her symptoms however since it was not covered under insurance so she was taking it sparingly using only 1 pill per day which provided relief for a few hours. She began a low gluten diet about one week ago and all of her gastrointestinal symptoms have substantially improved. Her reflux symptoms are currently well controlled. She cannot tolerate dicyclomine as it leads to worsening abdominal pain that she discontinued it. She had questions about Amitza or Linzess however that this would not be appropriate given that she is no longer having constipation.  Abd/pelvic CT 09/04/2016 IMPRESSION: 1. No acute abnormality seen within the abdomen or pelvis. 2. Uterine fibroid again noted.  Current Medications, Allergies, Past Medical History, Past Surgical History, Family History and Social History were reviewed in [employer] record.  Physical Exam: General: Well developed, well nourished, no acute distress Head: Normocephalic and atraumatic Eyes:  sclerae anicteric, EOMI Ears: Normal auditory acuity Mouth: No deformity or lesions Lungs: Clear throughout to auscultation Heart: Regular rate and rhythm; no murmurs, rubs or bruits Abdomen: Soft, non tender and non distended. No masses, hepatosplenomegaly or hernias noted. Normal Bowel sounds Musculoskeletal: Symmetrical with no gross deformities  Pulses:  Normal pulses noted Extremities: No clubbing, cyanosis, edema or deformities noted Neurological: Alert oriented x 4, grossly nonfocal Psychological:  Alert and cooperative. Normal mood and affect  Assessment and Recommendations:  1. IBS with alternating diarrhea and constipation. Gluten sensitivity.  Continue low gluten diet and avoid any foods that trigger symptoms. Dicyclomine discontinued due to side effects. Increase hyoscyamine to 1-2 4 times a day when necessary.   2. GERD. Continue antireflux measures and Dexlilant 60 mg daily

## 2016-10-09 ENCOUNTER — Other Ambulatory Visit: Payer: Self-pay | Admitting: Emergency Medicine

## 2016-10-09 MED ORDER — DEXLANSOPRAZOLE 60 MG PO CPDR: 60.0000 mg | DELAYED_RELEASE_CAPSULE | Freq: Every day | ORAL | 3 refills | Status: DC

## 2016-10-09 NOTE — Telephone Encounter (Signed)
Refill sent to pharmacy for [place].

## 2016-12-26 ENCOUNTER — Telehealth: Payer: Self-pay

## 2016-12-26 NOTE — Telephone Encounter (Signed)
Records rcvd from [facility] placed in your folder for review. Victorino December

## 2017-01-08 ENCOUNTER — Telehealth: Payer: Self-pay | Admitting: Gastroenterology

## 2017-01-09 MED ORDER — PROMETHAZINE HCL 25 MG PO TABS: 25.0000 mg | ORAL_TABLET | Freq: Four times a day (QID) | ORAL | 1 refills | Status: DC | PRN

## 2017-01-09 NOTE — Telephone Encounter (Signed)
Prescription sent to patient's pharmacy and patient notified. Patient verbalized understanding. 

## 2017-01-09 NOTE — Telephone Encounter (Signed)
Patient states she was given phenergan at her ER visit in June. She gets nauseated off and on when her reflux is worse. Patient states she meant to ask for a refill at her last office visit with Dr. Fuller Plan when she mentioned her nausea but forgot. Dr. Fuller Plan can we refill?

## 2017-01-09 NOTE — Telephone Encounter (Signed)
OK to refill #30, 1 refill

## 2017-01-28 ENCOUNTER — Ambulatory Visit: Payer: Medicare HMO | Admitting: Family Medicine

## 2017-01-28 ENCOUNTER — Encounter: Payer: Self-pay | Admitting: Family Medicine

## 2017-01-28 VITALS — BP 120/82 | HR 103 | Ht 67.5 in | Wt 232.4 lb

## 2017-01-28 DIAGNOSIS — R7303 Prediabetes: Secondary | ICD-10-CM | POA: Diagnosis not present

## 2017-01-28 DIAGNOSIS — Z23 Encounter for immunization: Secondary | ICD-10-CM

## 2017-01-28 DIAGNOSIS — F32A Depression, unspecified: Secondary | ICD-10-CM

## 2017-01-28 DIAGNOSIS — R454 Irritability and anger: Secondary | ICD-10-CM | POA: Diagnosis not present

## 2017-01-28 DIAGNOSIS — F419 Anxiety disorder, unspecified: Secondary | ICD-10-CM

## 2017-01-28 DIAGNOSIS — R350 Frequency of micturition: Secondary | ICD-10-CM | POA: Diagnosis not present

## 2017-01-28 DIAGNOSIS — F329 Major depressive disorder, single episode, unspecified: Secondary | ICD-10-CM | POA: Diagnosis not present

## 2017-01-28 DIAGNOSIS — R631 Polydipsia: Secondary | ICD-10-CM | POA: Diagnosis not present

## 2017-01-28 DIAGNOSIS — Z7689 Persons encountering health services in other specified circumstances: Secondary | ICD-10-CM | POA: Diagnosis not present

## 2017-01-28 DIAGNOSIS — E669 Obesity, unspecified: Secondary | ICD-10-CM

## 2017-01-28 DIAGNOSIS — F319 Bipolar disorder, unspecified: Secondary | ICD-10-CM

## 2017-01-28 LAB — POCT URINALYSIS DIP (PROADVANTAGE DEVICE)
Bilirubin, UA: NEGATIVE
Blood, UA: NEGATIVE
Glucose, UA: NEGATIVE mg/dL
Ketones, POC UA: NEGATIVE mg/dL
Leukocytes, UA: NEGATIVE
Nitrite, UA: NEGATIVE
Protein Ur, POC: NEGATIVE mg/dL
Specific Gravity, Urine: 1.015
Urobilinogen, Ur: NEGATIVE
pH, UA: 6 (ref 5.0–8.0)

## 2017-01-28 LAB — COMPREHENSIVE METABOLIC PANEL
AG Ratio: 1.5 (calc) (ref 1.0–2.5)
ALT: 13 U/L (ref 6–29)
AST: 18 U/L (ref 10–35)
Albumin: 4.4 g/dL (ref 3.6–5.1)
Alkaline phosphatase (APISO): 75 U/L (ref 33–130)
BUN: 11 mg/dL (ref 7–25)
CO2: 25 mmol/L (ref 20–32)
Calcium: 9.7 mg/dL (ref 8.6–10.4)
Chloride: 104 mmol/L (ref 98–110)
Creat: 0.85 mg/dL (ref 0.50–1.05)
Globulin: 3 g/dL (calc) (ref 1.9–3.7)
Glucose, Bld: 91 mg/dL (ref 65–99)
Potassium: 4 mmol/L (ref 3.5–5.3)
Sodium: 139 mmol/L (ref 135–146)
Total Bilirubin: 0.2 mg/dL (ref 0.2–1.2)
Total Protein: 7.4 g/dL (ref 6.1–8.1)

## 2017-01-28 LAB — TSH: TSH: 1.57 mIU/L (ref 0.40–4.50)

## 2017-01-28 LAB — POCT GLYCOSYLATED HEMOGLOBIN (HGB A1C): Hemoglobin A1C: 6.2

## 2017-01-28 LAB — GLUCOSE, POCT (MANUAL RESULT ENTRY): POC Glucose: 104 mg/dl — AB (ref 70–99)

## 2017-01-28 NOTE — Progress Notes (Signed)
Subjective:    Patient ID: [name], female    DOB: 1960/03/27, 56 y.o.   MRN: [id number]  HPI Chief Complaint  Patient presents with  . new pt    new pt get established, discuss menopause, history of depression/aniexty.  will get flu shot today   She is new to the practice and here to establish care.  Previous medical: [facility].  Last Medicare AWV 02/11/2016   Reports increased urination and thirst for the past several weeks. Denies history of diabetes. No unexplained weight loss.   Other providers: Dr. Fuller Plan- GI OB/GYN- [facility] - due to return  Psychiatrist- at [facility] Cardiologist 5 years ago and normal cardiac work up.   Stress induced asthma- no recent flares.   Her GI is managing her IBS and GERD.  States she was taking medication for reflux- Dexilant. Has tried omeprazole. Took Levsin for a while and liked it for IBS but her insurance did not cover it. States she is drinking Aloe Vera and this helps with her symptoms.  IBS in the past was mainly diarrhea and now more constipation.   History of depression and anxiety. Diagnosed in 1996 by Dr. Redmond School.  Under the care of psychiatrist for this. Reports having inpatient treatment earlier this year due to SI and not taking her medications. She still has thoughts she would be better off dead but denies having any plans to harm herself. States she feels more hopeful than in the past.  Goes to group therapy. One on one therapy has not worked.  States she cries a lot. Reports increased irritability, specifically has more irritability when she is hungry.   Reports having hot flashes but these are manageable. Uses peppermint oil at bedtime on the back of her neck.   States she does not work and is disabled due to anxiety and depression.  LMP: 06/2014  Last pap smear: 2016 Mammogram: 4-5 years ago History of tubal ligation, colposcopy and labial cyst removal.   She plans to see her OB/GYN for future pap smear.   Single. Divorced many years ago. 2 kids.  States she has been disabled for 2 years or so due to depression and anxiety. States she has a "low tolerance for stress".   Heart rate is elevated and this is most likely due to having a large coffee prior to arrival.   Depression screen PHQ 2/9 01/28/2017  Decreased Interest 2  Down, Depressed, Hopeless 2  PHQ - 2 Score 4  Altered sleeping 2  Tired, decreased energy 2  Change in appetite 0  Feeling bad or failure about yourself  2  Trouble concentrating 2  Moving slowly or fidgety/restless 1  Suicidal thoughts 2  PHQ-9 Score 15  Difficult doing work/chores Somewhat difficult    Denies fever, chills, dizziness, chest pain, palpitations, shortness of breath, abdominal pain, N/V/D,  LE edema.   Reviewed allergies, medications, past medical, surgical, family, and social history.   Review of Systems Pertinent positives and negatives in the history of present illness.     Objective:   Physical Exam BP 120/82   Pulse (!) 103   Ht 5' 7.5" (1.715 m)   Wt 232 lb 6.4 oz (105.4 kg)   LMP 06/23/2014   BMI 35.86 kg/m         Assessment & Plan:  Anxiety and depression - Plan: TSH  Bipolar affective disorder, remission status unspecified (HCC)  Increased thirst -  Plan: Comprehensive metabolic panel, POCT glucose (manual entry), POCT glycosylated hemoglobin (Hb A1C), TSH  Urinary frequency - Plan: POCT Urinalysis DIP (Proadvantage Device), Comprehensive metabolic panel, POCT glucose (manual entry), POCT glycosylated hemoglobin (Hb A1C), TSH  Encounter to establish care  Irritability - Plan: Comprehensive metabolic panel, POCT glucose (manual entry), TSH  Obesity (BMI 30-39.9) - Plan: POCT glucose (manual entry), POCT glycosylated hemoglobin (Hb A1C)  Needs flu shot - Plan: Flu Vaccine QUAD 36+ mos IM  Prediabetes  POCT random glucose 104  Hgb A1c 6.2%. Discussed in depth  that she has prediabetes and how to prolong to prevent diabetes. Discussed healthy diet and exercise  Review of her medical records from [place] shows Hgb A1c 6.1% in March 2017.  UA- neg Plan to check labs due to symptoms.  Hot flashes are manageable and she will let me know if these worsen.  She will call and schedule with her OB/GYN for pap smear and mammogram or follow up here for these tests.  She will continue seeing her psychiatrist and going to counseling for anxiety and depression. She assured me that she is not in any danger and would not harm herself.  She will follow up as needed, pending labs or when she is due for her AWV.  Flu shot given.  Spent a minimum of 45 minutes face to face with patient and at least 50% of time was in counseling and coordination of care.

## 2017-01-28 NOTE — Patient Instructions (Signed)
You have pre-diabetes. Cut back on sugar and carbohydrates. Increase physical activity.

## 2017-01-29 ENCOUNTER — Encounter: Payer: Self-pay | Admitting: Family Medicine

## 2017-01-29 ENCOUNTER — Encounter: Payer: Self-pay | Admitting: Interventional Radiology

## 2017-02-10 ENCOUNTER — Encounter: Payer: Self-pay | Admitting: Family Medicine

## 2017-02-24 ENCOUNTER — Encounter: Payer: Self-pay | Admitting: Gastroenterology

## 2017-04-09 ENCOUNTER — Encounter: Payer: Self-pay | Admitting: Obstetrics & Gynecology

## 2017-04-21 ENCOUNTER — Ambulatory Visit (INDEPENDENT_AMBULATORY_CARE_PROVIDER_SITE_OTHER): Payer: Medicare HMO | Admitting: Family Medicine

## 2017-04-21 ENCOUNTER — Encounter: Payer: Self-pay | Admitting: Family Medicine

## 2017-04-21 VITALS — BP 128/82 | HR 102

## 2017-04-21 DIAGNOSIS — J029 Acute pharyngitis, unspecified: Secondary | ICD-10-CM | POA: Diagnosis not present

## 2017-04-21 LAB — POCT RAPID STREP A (OFFICE): Rapid Strep A Screen: NEGATIVE

## 2017-04-21 NOTE — Progress Notes (Signed)
   Subjective:    Patient ID: [name], female    DOB: 14-Jun-1960, 57 y.o.   MRN: [id number]  HPI She complains of a several day history of frontal type headache with cough, malaise and myalgias with nasal congestion, sore throat, chills and earache.   Review of Systems     Objective:   Physical Exam Alert and in no distress. Tympanic membranes and canals are normal. Pharyngeal area is normal. Neck is supple without adenopathy or thyromegaly. Cardiac exam shows a regular sinus rhythm without murmurs or gallops. Lungs are clear to auscultation.        Assessment & Plan:  Sore throat - Plan: Rapid Strep A Recommend supportive care and if condition worsens, call me.  Explained that this should run its course in a week and by next Monday or Tuesday she should feel much better.  If not she will call and I will consider using an antibiotic.

## 2017-04-27 ENCOUNTER — Telehealth: Payer: Self-pay | Admitting: Family Medicine

## 2017-04-27 MED ORDER — AMOXICILLIN 875 MG PO TABS: 875.0000 mg | ORAL_TABLET | Freq: Two times a day (BID) | ORAL | 0 refills | Status: DC

## 2017-04-27 NOTE — Telephone Encounter (Signed)
Let her know that I called an antibiotic in

## 2017-04-27 NOTE — Telephone Encounter (Signed)
Pt called and stated that she is not any better from her last visit. Symptoms at that visit discussed and she stated that they are the same. No changes in those just not any better. Pt uses [facility] and can be reached at [phone].

## 2017-04-27 NOTE — Telephone Encounter (Signed)
Patient advised.

## 2017-05-01 ENCOUNTER — Ambulatory Visit
Admission: RE | Admit: 2017-05-01 | Discharge: 2017-05-01 | Disposition: A | Discharge status: Home / Self Care | Payer: Medicare HMO | Source: Ambulatory Visit | Attending: Medical | Admitting: Medical

## 2017-05-01 ENCOUNTER — Ambulatory Visit (INDEPENDENT_AMBULATORY_CARE_PROVIDER_SITE_OTHER): Payer: Medicare HMO | Admitting: Medical

## 2017-05-01 ENCOUNTER — Encounter: Payer: Self-pay | Admitting: Medical

## 2017-05-01 VITALS — BP 132/80 | HR 97 | Wt 229.6 lb

## 2017-05-01 DIAGNOSIS — J988 Other specified respiratory disorders: Secondary | ICD-10-CM

## 2017-05-01 DIAGNOSIS — J4520 Mild intermittent asthma, uncomplicated: Secondary | ICD-10-CM | POA: Diagnosis not present

## 2017-05-01 DIAGNOSIS — R0602 Shortness of breath: Secondary | ICD-10-CM

## 2017-05-01 MED ORDER — BENZONATATE 200 MG PO CAPS: 200.0000 mg | ORAL_CAPSULE | Freq: Three times a day (TID) | ORAL | 0 refills | Status: DC | PRN

## 2017-05-01 MED ORDER — BUDESONIDE-FORMOTEROL FUMARATE 160-4.5 MCG/ACT IN AERO: 2.0000 | INHALATION_SPRAY | Freq: Two times a day (BID) | RESPIRATORY_TRACT | 0 refills | Status: DC

## 2017-05-01 NOTE — Patient Instructions (Signed)
Recommendations  Begin Symbicort sample inhaler 2 puffs twice a day for the next 1-2 weeks until you run out.  This will help reduce inflammation in the lungs and will help open up the air sacs in the lungs   Symbicort is to help reduce your shortness of breath and tightness.  Rinse your mouth out with water after using Symbicort  You can continue albuterol rescue inhaler 2 puffs every 4-6 hours for shortness of breath, wheezing, tightness or cough spells  Finish out the amoxicillin antibiotic  Rest and hydrate well  Go for chest x-ray today  I would expect your symptoms to gradually improve over the next 3-4 days

## 2017-05-01 NOTE — Progress Notes (Signed)
Subjective: Chief Complaint  Patient presents with  . short of breathe    2 x weeks,  was give antibotic on monday still hasn't help   Here for SOB, not feeling better.   Has hx/o stress induced asthma, has inhaler.  The last few times she has had a cold, had to get a breathing treatment for bronchitis.    Saw Dr. Redmond School for symptoms.  Amoxicillin was called in this past week.   Nonsmoker.    currently she reports anxiety attacks dealing with daughter having to move up to [place].  But having SOB, coughing, productive cough, some sore throat.   No fever.   Has some body aches, some chills. Some nausea, no vomiting or diarrhea.   No chest pain.  Has had to have steroid and neb in past when she has had this a year ago and one other time.  Had cardiac eval 2014, nuclear stress test  Thinks anxiety is playing a role with dealing with her daughter's move, having some panic attacks.  No swelling in legs.   No other aggravating or relieving factors. No other complaint.   Past Medical History:  Diagnosis Date  . Anxiety   . Arthritis   . Asthma    stress induced asthma  . Chlamydia   . Depression   . Fibroid   . GERD (gastroesophageal reflux disease)   . Hyperlipidemia   . IBS (irritable bowel syndrome)   . Obesity   . Trichomonas   . Tubular adenoma of colon 01/2012   Current Outpatient Medications on File Prior to Visit  Medication Sig Dispense Refill  . albuterol (PROVENTIL HFA;VENTOLIN HFA) 108 (90 BASE) MCG/ACT inhaler Inhale 2 puffs into the lungs every 6 (six) hours as needed for wheezing.     Marland Kitchen amoxicillin (AMOXIL) 875 MG tablet Take 1 tablet (875 mg total) by mouth 2 (two) times daily. 20 tablet 0  . Ascorbic Acid (VITAMIN C) 1000 MG tablet Take 3,000 mg by mouth daily.    Marland Kitchen aspirin 81 MG tablet Take 81 mg by mouth daily.      . Black Cohosh 200 MG CAPS Take by mouth.    . Cholecalciferol (D3 VITAMIN PO) Take 2 capsules daily by mouth.    . clonazePAM (KLONOPIN) 1 MG  tablet Take 1 mg 2 (two) times daily by mouth.    . fluticasone (FLONASE) 50 MCG/ACT nasal spray Place 2 sprays into both nostrils daily.    . Melatonin 5 MG CAPS Take 5 mg by mouth at bedtime as needed (sleep).     . mirtazapine (REMERON) 30 MG tablet Take 30 mg by mouth at bedtime.    . Multiple Vitamin (MULTIVITAMIN) tablet Take 1 tablet daily by mouth.    . promethazine (PHENERGAN) 25 MG tablet Take 1 tablet (25 mg total) by mouth every 6 (six) hours as needed for nausea or vomiting. 30 tablet 1  . buPROPion (WELLBUTRIN XL) 150 MG 24 hr tablet Take 150 mg daily by mouth.     Current Facility-Administered Medications on File Prior to Visit  Medication Dose Route Frequency Provider Last Rate Last Dose  . diphenhydrAMINE (BENADRYL) injection 12.5 mg  12.5 mg Intravenous Q6H PRN Arne Cleveland, MD       Or  . diphenhydrAMINE (BENADRYL) 12.5 MG/5ML elixir 12.5 mg  12.5 mg Oral Q6H PRN Arne Cleveland, MD      . HYDROmorphone (DILAUDID) 1 mg/mL PCA injection   Intravenous Q4H Arne Cleveland, MD      .  naloxone (NARCAN) injection 0.4 mg  0.4 mg Intravenous PRN Arne Cleveland, MD       And  . sodium chloride flush (NS) 0.9 % injection 9 mL  9 mL Intravenous PRN Arne Cleveland, MD      . ondansetron [facility]) injection 4 mg  4 mg Intravenous Q6H PRN Arne Cleveland, MD       ROS as in subjective   Objective: BP 132/80   Pulse 97   Wt 229 lb 9.6 oz (104.1 kg)   LMP 06/23/2014   SpO2 98%   BMI 35.43 kg/m   General appearance: alert, no distress, WD/WN, mildly ill appearing HEENT: normocephalic, sclerae anicteric, conjunctiva pink and moist, TMs pearly, nares patent, no discharge or erythema, pharynx with mild erythema, tonsils unremarkable Oral cavity: MMM, no lesions Neck: supple, no lymphadenopathy, no thyromegaly, no masses Heart: RRR, normal S1, S2, no murmurs Lungs: CTA bilaterally, no wheezes, rhonchi, or rales Pulses: 2+ symmetric     Assessment: Encounter Diagnoses   Name Primary?  . SOB (shortness of breath) Yes  . Respiratory tract infection   . Mild intermittent asthma without complication      Plan: We discussed her symptoms, exam findings.  I will send of her chest x-ray.  Reviewed her 2004 nuclear stress test, doubt any cardiac issue today or PE.  I suspect her symptoms are primarily respiratory tract infection with underlying asthma, and her acute anxiety issues with her daughter's move are aggravating things.  We reviewed the recommendations below  Patient Instructions  Recommendations  Begin Symbicort sample inhaler 2 puffs twice a day for the next 1-2 weeks until you run out.  This will help reduce inflammation in the lungs and will help open up the air sacs in the lungs   Symbicort is to help reduce your shortness of breath and tightness.  Rinse your mouth out with water after using Symbicort  You can continue albuterol rescue inhaler 2 puffs every 4-6 hours for shortness of breath, wheezing, tightness or cough spells  Finish out the amoxicillin antibiotic  Rest and hydrate well  Go for chest x-ray today  I would expect your symptoms to gradually improve over the next 3-4 days   [name] was seen today for short of breathe.  Diagnoses and all orders for this visit:  SOB (shortness of breath) -     DG Chest 2 View; Future -     Pulse oximetry (single); Future  Respiratory tract infection -     DG Chest 2 View; Future  Mild intermittent asthma without complication  Other orders -     benzonatate (TESSALON) 200 MG capsule; Take 1 capsule (200 mg total) by mouth 3 (three) times daily as needed for cough. -     budesonide-formoterol (SYMBICORT) 160-4.5 MCG/ACT inhaler; Inhale 2 puffs into the lungs 2 (two) times daily.

## 2017-05-11 ENCOUNTER — Encounter: Payer: Self-pay | Admitting: Obstetrics & Gynecology

## 2017-06-10 ENCOUNTER — Encounter: Payer: Self-pay | Admitting: Obstetrics & Gynecology

## 2017-07-07 ENCOUNTER — Telehealth: Payer: Self-pay | Admitting: Gastroenterology

## 2017-07-07 NOTE — Telephone Encounter (Signed)
Patient notified that due to her history of polyps she is not a candidate for cologuard.  We scheduled her recall colon and previsit for May

## 2017-07-28 ENCOUNTER — Ambulatory Visit (AMBULATORY_SURGERY_CENTER): Payer: Self-pay

## 2017-07-28 ENCOUNTER — Encounter: Payer: Self-pay | Admitting: Gastroenterology

## 2017-07-28 VITALS — Ht 67.0 in | Wt 233.0 lb

## 2017-07-28 DIAGNOSIS — Z86010 Personal history of colonic polyps: Secondary | ICD-10-CM

## 2017-07-28 MED ORDER — NA SULFATE-K SULFATE-MG SULF 17.5-3.13-1.6 GM/177ML PO SOLN: 1.0000 | Freq: Once | ORAL | 0 refills | Status: AC

## 2017-07-28 NOTE — Progress Notes (Signed)
Per pt, no allergies to soy or egg products.Pt not taking any weight loss meds or using  O2 at home.  Emmi video sent to pt's email. 

## 2017-08-10 ENCOUNTER — Other Ambulatory Visit: Payer: Self-pay

## 2017-08-10 ENCOUNTER — Encounter: Payer: Self-pay | Admitting: Gastroenterology

## 2017-08-10 ENCOUNTER — Ambulatory Visit (AMBULATORY_SURGERY_CENTER): Payer: Medicare HMO | Admitting: Gastroenterology

## 2017-08-10 VITALS — BP 122/77 | HR 86 | Temp 98.6°F | Resp 17 | Ht 67.0 in | Wt 233.0 lb

## 2017-08-10 DIAGNOSIS — D123 Benign neoplasm of transverse colon: Secondary | ICD-10-CM | POA: Diagnosis not present

## 2017-08-10 DIAGNOSIS — Z86010 Personal history of colonic polyps: Secondary | ICD-10-CM | POA: Diagnosis present

## 2017-08-10 MED ORDER — SODIUM CHLORIDE 0.9 % IV SOLN: 500.0000 mL | Freq: Once | INTRAVENOUS | Status: DC

## 2017-08-10 NOTE — Progress Notes (Signed)
Report to PACU, RN, vss, BBS= Clear.  

## 2017-08-10 NOTE — Op Note (Signed)
[facility] Patient Name: [name] Procedure Date: 08/10/2017 2:04 PM MRN: [id number] Endoscopist: Ladene Artist , MD Age: 57 Referring MD:  Date of Birth: Sep 09, 1960 Gender: Female Account #: [id number] Procedure:                Colonoscopy Indications:              Surveillance: Personal history of adenomatous                            polyps on last colonoscopy 5 years ago Medicines:                Monitored Anesthesia Care Procedure:                Pre-Anesthesia Assessment:                           - Prior to the procedure, a History and Physical                            was performed, and patient medications and                            allergies were reviewed. The patient's tolerance of                            previous anesthesia was also reviewed. The risks                            and benefits of the procedure and the sedation                            options and risks were discussed with the patient.                            All questions were answered, and informed consent                            was obtained. Prior Anticoagulants: The patient has                            taken no previous anticoagulant or antiplatelet                            agents. ASA Grade Assessment: II - A patient with                            mild systemic disease. After reviewing the risks                            and benefits, the patient was deemed in                            satisfactory condition to undergo the procedure.  After obtaining informed consent, the colonoscope                            was passed under direct vision. Throughout the                            procedure, the patient's blood pressure, pulse, and                            oxygen saturations were monitored continuously. The                            Model PCF-H190DL [id number]) scope was introduced                            through the anus  and advanced to the the cecum,                            identified by appendiceal orifice and ileocecal                            valve. The ileocecal valve, appendiceal orifice,                            and rectum were photographed. The quality of the                            bowel preparation was good. The colonoscopy was                            performed without difficulty. The patient tolerated                            the procedure well. Scope In: 2:19:11 PM Scope Out: 2:29:13 PM Scope Withdrawal Time: 0 hours 7 minutes 56 seconds  Total Procedure Duration: 0 hours 10 minutes 2 seconds  Findings:                 The perianal and digital rectal examinations were                            normal.                           A 6 mm polyp was found in the transverse colon. The                            polyp was sessile. The polyp was removed with a                            cold snare. Resection and retrieval were complete.                           A few small-mouthed diverticula were found in the  sigmoid colon. There was no evidence of                            diverticular bleeding.                           Internal hemorrhoids were found during                            retroflexion. The hemorrhoids were small and Grade                            I (internal hemorrhoids that do not prolapse).                           The exam was otherwise without abnormality on                            direct and retroflexion views. Complications:            No immediate complications. Estimated blood loss:                            None. Estimated Blood Loss:     Estimated blood loss: none. Impression:               - One 6 mm polyp in the transverse colon, removed                            with a cold snare. Resected and retrieved.                           - Mild sigmoid diverticulosis.                           - Internal hemorrhoids.                            - The examination was otherwise normal on direct                            and retroflexion views. Recommendation:           - Repeat colonoscopy in 5 years for surveillance.                           - Patient has a contact number available for                            emergencies. The signs and symptoms of potential                            delayed complications were discussed with the                            patient. Return to normal activities tomorrow.  Written discharge instructions were provided to the                            patient.                           - High fiber diet.                           - Continue present medications.                           - Await pathology results. Ladene Artist, MD 08/10/2017 2:32:05 PM This report has been signed electronically.

## 2017-08-10 NOTE — Progress Notes (Signed)
No changes in medical or surgical hx since PV per pt 

## 2017-08-10 NOTE — Patient Instructions (Signed)
Impression/Recommendations:  Polyp handout given to patient. Diverticulosis handout given to patient. Hemorrhoid handout given to patient. High fiber diet handout given to patient.  Repeat colonoscopy in 5 years for surveillance.  Continue present medications.  YOU HAD AN ENDOSCOPIC PROCEDURE TODAY AT [facility]:   Refer to the procedure report that was given to you for any specific questions about what was found during the examination.  If the procedure report does not answer your questions, please call your gastroenterologist to clarify.  If you requested that your care partner not be given the details of your procedure findings, then the procedure report has been included in a sealed envelope for you to review at your convenience later.  YOU SHOULD EXPECT: Some feelings of bloating in the abdomen. Passage of more gas than usual.  Walking can help get rid of the air that was put into your GI tract during the procedure and reduce the bloating. If you had a lower endoscopy (such as a colonoscopy or flexible sigmoidoscopy) you may notice spotting of blood in your stool or on the toilet paper. If you underwent a bowel prep for your procedure, you may not have a normal bowel movement for a few days.  Please Note:  You might notice some irritation and congestion in your nose or some drainage.  This is from the oxygen used during your procedure.  There is no need for concern and it should clear up in a day or so.  SYMPTOMS TO REPORT IMMEDIATELY:   Following lower endoscopy (colonoscopy or flexible sigmoidoscopy):  Excessive amounts of blood in the stool  Significant tenderness or worsening of abdominal pains  Swelling of the abdomen that is new, acute  Fever of 100F or higher  For urgent or emergent issues, a gastroenterologist can be reached at any hour by calling [phone].   DIET:  We do recommend a small meal at first, but then you may proceed to your regular diet.   Drink plenty of fluids but you should avoid alcoholic beverages for 24 hours.  ACTIVITY:  You should plan to take it easy for the rest of today and you should NOT DRIVE or use heavy machinery until tomorrow (because of the sedation medicines used during the test).    FOLLOW UP: Our staff will call the number listed on your records the next business day following your procedure to check on you and address any questions or concerns that you may have regarding the information given to you following your procedure. If we do not reach you, we will leave a message.  However, if you are feeling well and you are not experiencing any problems, there is no need to return our call.  We will assume that you have returned to your regular daily activities without incident.  If any biopsies were taken you will be contacted by phone or by letter within the next 1-3 weeks.  Please call us at [phone] if you have not heard about the biopsies in 3 weeks.    SIGNATURES/CONFIDENTIALITY: You and/or your care partner have signed paperwork which will be entered into your electronic medical record.  These signatures attest to the fact that that the information above on your After Visit Summary has been reviewed and is understood.  Full responsibility of the confidentiality of this discharge information lies with you and/or your care-partner.

## 2017-08-10 NOTE — Progress Notes (Signed)
Called to room to assist during endoscopic procedure.  Patient ID and intended procedure confirmed with present staff. Received instructions for my participation in the procedure from the performing physician.  

## 2017-08-11 ENCOUNTER — Telehealth: Payer: Self-pay

## 2017-08-11 NOTE — Telephone Encounter (Signed)
  Follow up Call-  Call back number 08/10/2017 06/26/2015  Post procedure Call Back phone  # [phone]  Permission to leave phone message Yes Yes  Some recent data might be hidden     Patient questions:  Do you have a fever, pain , or abdominal swelling? No. Pain Score  0 *  Have you tolerated food without any problems? Yes.    Have you been able to return to your normal activities? Yes.    Do you have any questions about your discharge instructions: Diet   No. Medications  No. Follow up visit  No.  Do you have questions or concerns about your Care? No.  Actions: * If pain score is 4 or above: No action needed, pain <4.  No problems noted per pt. maw

## 2017-08-16 ENCOUNTER — Encounter: Payer: Self-pay | Admitting: Gastroenterology

## 2017-08-27 ENCOUNTER — Ambulatory Visit (INDEPENDENT_AMBULATORY_CARE_PROVIDER_SITE_OTHER): Payer: Medicare HMO | Admitting: Family Medicine

## 2017-08-27 ENCOUNTER — Encounter: Payer: Self-pay | Admitting: Family Medicine

## 2017-08-27 VITALS — BP 124/84 | HR 98 | Temp 98.5°F | Resp 16 | Ht 67.0 in | Wt 231.6 lb

## 2017-08-27 DIAGNOSIS — R252 Cramp and spasm: Secondary | ICD-10-CM

## 2017-08-27 DIAGNOSIS — R61 Generalized hyperhidrosis: Secondary | ICD-10-CM | POA: Diagnosis not present

## 2017-08-27 DIAGNOSIS — R5383 Other fatigue: Secondary | ICD-10-CM

## 2017-08-27 DIAGNOSIS — M79605 Pain in left leg: Secondary | ICD-10-CM | POA: Diagnosis not present

## 2017-08-27 DIAGNOSIS — M79604 Pain in right leg: Secondary | ICD-10-CM | POA: Diagnosis not present

## 2017-08-27 DIAGNOSIS — N951 Menopausal and female climacteric states: Secondary | ICD-10-CM | POA: Diagnosis not present

## 2017-08-27 DIAGNOSIS — M25561 Pain in right knee: Secondary | ICD-10-CM

## 2017-08-27 DIAGNOSIS — R7303 Prediabetes: Secondary | ICD-10-CM | POA: Diagnosis not present

## 2017-08-27 DIAGNOSIS — R202 Paresthesia of skin: Secondary | ICD-10-CM | POA: Diagnosis not present

## 2017-08-27 DIAGNOSIS — G8929 Other chronic pain: Secondary | ICD-10-CM

## 2017-08-27 MED ORDER — DICLOFENAC SODIUM 75 MG PO TBEC: 75.0000 mg | DELAYED_RELEASE_TABLET | Freq: Two times a day (BID) | ORAL | 0 refills | Status: DC

## 2017-08-27 MED ORDER — FLUTICASONE PROPIONATE 50 MCG/ACT NA SUSP: 2.0000 | Freq: Every day | NASAL | 1 refills | Status: DC

## 2017-08-27 NOTE — Progress Notes (Signed)
Subjective:    Patient ID: [name], female    DOB: 1960/07/23, 57 y.o.   MRN: [id number]  HPI Chief Complaint  Patient presents with  . menopause    pains starting at hip down to big toe, knee feels locked if sitting for long periods of time. hot flashes, night sweats   She is here with complaints of bilateral leg pain, right hip pain that radiates down to her right knee . States her knee "locks" up. This has been ongoing for a few months.  Pain occurs everyday. Worse with driving and ambulating. States occasionally she has pain in her legs at rest. She rubs her legs with alcohol and takes ibuprofen to get relief.  She also reports severe fatigue, hot flashes and night sweats. Questions whether her symptoms could be related to menopause.    States she started back on vitamin B12 and Black Kohosh yesterday. Plans to follow up with her OB/GYN Dr. Lynnette Caffey for fibroids, pap smear and menopause.   She has noticed muscle cramps in her toes, legs at various times.     She was on Effexor and this was stopped this last year when she was at the [facility] getting treatment. She was put on Wellbutrin and Zoloft and states she had to stop the Zoloft due to side effects.   States she is followed at the [facility] every 3 months for Bipolar.    Prediabetes- Hgb A1c 6.2%  Diet is unhealthy and she does not exercise.   She reports drinking "a lot of water" and always has. Denies increased thirst or urination.   Denies fever, chills, dizziness, chest pain, palpitations, shortness of breath, N/V/D, urinary symptoms, LE edema.   Past Medical History:  Diagnosis Date  . Abdominal pain    due to fibroids  . Anxiety   . Arthritis   . Asthma    stress induced asthma  . Chlamydia   . Depression   . Fibroid    uterine  . GERD (gastroesophageal reflux disease)   . Hyperlipidemia   . IBS (irritable bowel syndrome)   . Obesity   . Trichomonas   . Tubular adenoma of colon 01/2012    Reviewed allergies, medications, past medical, surgical, family, and social history.     Review of Systems Pertinent positives and negatives in the history of present illness.     Objective:   Physical Exam  Constitutional: She is oriented to person, place, and time. She appears well-developed and well-nourished. No distress.  HENT:  Mouth/Throat: Oropharynx is clear and moist.  Eyes: Pupils are equal, round, and reactive to light. Conjunctivae are normal.  Neck: Normal range of motion. Neck supple.  Cardiovascular: Normal rate, regular rhythm, normal heart sounds and intact distal pulses.  Pulmonary/Chest: Effort normal and breath sounds normal.  Musculoskeletal: Normal range of motion.       Right knee: She exhibits normal range of motion and no swelling. No tenderness found.  Negative McMurrays  Lymphadenopathy:    She has no cervical adenopathy.  Neurological: She is alert and oriented to person, place, and time. She has normal strength. No cranial nerve deficit or sensory deficit.  Skin: Skin is warm and dry. Capillary refill takes less than 2 seconds. No rash noted. She is not diaphoretic. No pallor.   BP 124/84   Pulse 98   Temp 98.5 F (36.9 C) (Oral)   Resp 16   Ht 5\' 7"  (1.702 m)   Wt  231 lb 9.6 oz (105.1 kg)   LMP 06/23/2014   SpO2 96%   BMI 36.27 kg/m       Assessment & Plan:  Fatigue, unspecified type - Plan: CBC with Differential/Platelet, Comprehensive metabolic panel, TSH, T4, free, T3, VITAMIN D 25 Hydroxy (Vit-D Deficiency, Fractures), Vitamin B12  Chronic pain of right knee - Plan: DG Knee Complete 4 Views Right, diclofenac (VOLTAREN) 75 MG EC tablet, Ambulatory referral to Orthopedic Surgery  Leg pain, bilateral  Hot flashes, menopausal - Plan: HIV antibody, TSH, T4, free, T3  Chronic night sweats - Plan: HIV antibody, TSH, T4, free, T3  Prediabetes - Plan: Hemoglobin A1c  Paresthesia of both lower extremities - Plan: TSH, T4, free, T3,  Vitamin B12  Muscle cramping - Plan: Magnesium  Discussed multiple etiologies for fatigue. Check labs and follow up.  Knee exam is unremarkable. Will get an XR, have her try diclofenac and see ortho.  She will see her OB/GYN due to fibroids, overdue pap smear and hot flashes. Offered to try Effexor and she reports not being able to take this.  Prediabetes- counseling done on healthy lifestyle. Recheck Hgb A1c

## 2017-08-27 NOTE — Patient Instructions (Addendum)
Call and schedule with your OB/GYN as discussed.   Get your knee XR at your convenience. Take the diclofenac as prescribed until you see the orthopedist.   We will call you with your lab results.

## 2017-08-28 LAB — COMPREHENSIVE METABOLIC PANEL
ALT: 14 IU/L (ref 0–32)
AST: 17 IU/L (ref 0–40)
Albumin/Globulin Ratio: 1.6 (ref 1.2–2.2)
Albumin: 4.5 g/dL (ref 3.5–5.5)
Alkaline Phosphatase: 82 IU/L (ref 39–117)
BUN/Creatinine Ratio: 13 (ref 9–23)
BUN: 10 mg/dL (ref 6–24)
Bilirubin Total: 0.2 mg/dL (ref 0.0–1.2)
CO2: 24 mmol/L (ref 20–29)
Calcium: 9.5 mg/dL (ref 8.7–10.2)
Chloride: 106 mmol/L (ref 96–106)
Creatinine, Ser: 0.78 mg/dL (ref 0.57–1.00)
GFR calc Af Amer: 98 mL/min/{1.73_m2} (ref >59)
GFR calc non Af Amer: 85 mL/min/{1.73_m2} (ref >59)
Globulin, Total: 2.8 g/dL (ref 1.5–4.5)
Glucose: 99 mg/dL (ref 65–99)
Potassium: 4.1 mmol/L (ref 3.5–5.2)
Sodium: 143 mmol/L (ref 134–144)
Total Protein: 7.3 g/dL (ref 6.0–8.5)

## 2017-08-28 LAB — CBC WITH DIFFERENTIAL/PLATELET
Basophils Absolute: 0 10*3/uL (ref 0.0–0.2)
Basos: 1 %
EOS (ABSOLUTE): 0.1 10*3/uL (ref 0.0–0.4)
Eos: 2 %
Hematocrit: 35.5 % (ref 34.0–46.6)
Hemoglobin: 12.1 g/dL (ref 11.1–15.9)
Immature Grans (Abs): 0 10*3/uL (ref 0.0–0.1)
Immature Granulocytes: 0 %
Lymphocytes Absolute: 3.2 10*3/uL — ABNORMAL HIGH (ref 0.7–3.1)
Lymphs: 45 %
MCH: 24.7 pg — ABNORMAL LOW (ref 26.6–33.0)
MCHC: 34.1 g/dL (ref 31.5–35.7)
MCV: 73 fL — ABNORMAL LOW (ref 79–97)
Monocytes Absolute: 0.5 10*3/uL (ref 0.1–0.9)
Monocytes: 7 %
Neutrophils Absolute: 3.1 10*3/uL (ref 1.4–7.0)
Neutrophils: 45 %
Platelets: 223 10*3/uL (ref 150–450)
RBC: 4.89 x10E6/uL (ref 3.77–5.28)
RDW: 15.3 % (ref 12.3–15.4)
WBC: 6.9 10*3/uL (ref 3.4–10.8)

## 2017-08-28 LAB — T3: T3, Total: 124 ng/dL (ref 71–180)

## 2017-08-28 LAB — VITAMIN B12: Vitamin B-12: 891 pg/mL (ref 232–1245)

## 2017-08-28 LAB — HIV ANTIBODY (ROUTINE TESTING W REFLEX): HIV Screen 4th Generation wRfx: NONREACTIVE

## 2017-08-28 LAB — VITAMIN D 25 HYDROXY (VIT D DEFICIENCY, FRACTURES): Vit D, 25-Hydroxy: 37.9 ng/mL (ref 30.0–100.0)

## 2017-08-28 LAB — TSH: TSH: 2.04 u[IU]/mL (ref 0.450–4.500)

## 2017-08-28 LAB — MAGNESIUM: Magnesium: 1.9 mg/dL (ref 1.6–2.3)

## 2017-08-28 LAB — T4, FREE: Free T4: 0.98 ng/dL (ref 0.82–1.77)

## 2017-08-28 LAB — HEMOGLOBIN A1C
Est. average glucose Bld gHb Est-mCnc: 137 mg/dL
Hgb A1c MFr Bld: 6.4 % — ABNORMAL HIGH (ref 4.8–5.6)

## 2017-09-01 ENCOUNTER — Ambulatory Visit
Admission: RE | Admit: 2017-09-01 | Discharge: 2017-09-01 | Disposition: A | Discharge status: Home / Self Care | Payer: Medicare HMO | Source: Ambulatory Visit | Attending: Family Medicine | Admitting: Family Medicine

## 2017-09-01 DIAGNOSIS — G8929 Other chronic pain: Secondary | ICD-10-CM

## 2017-09-01 DIAGNOSIS — M25561 Pain in right knee: Principal | ICD-10-CM

## 2017-09-02 ENCOUNTER — Ambulatory Visit (INDEPENDENT_AMBULATORY_CARE_PROVIDER_SITE_OTHER): Payer: Medicare HMO | Admitting: Orthopaedic Surgery

## 2017-09-02 ENCOUNTER — Encounter: Payer: Self-pay | Admitting: Family Medicine

## 2017-09-13 ENCOUNTER — Other Ambulatory Visit: Payer: Self-pay | Admitting: Family Medicine

## 2017-09-13 DIAGNOSIS — G8929 Other chronic pain: Secondary | ICD-10-CM

## 2017-09-13 DIAGNOSIS — M25561 Pain in right knee: Principal | ICD-10-CM

## 2017-09-14 NOTE — Telephone Encounter (Signed)
Let's have her see the orthopedist as scheduled in 2 days and let them determine if she should continue on this or they may change her treatment plan.

## 2017-09-16 ENCOUNTER — Ambulatory Visit (INDEPENDENT_AMBULATORY_CARE_PROVIDER_SITE_OTHER): Payer: Medicare HMO | Admitting: Orthopaedic Surgery

## 2017-09-18 ENCOUNTER — Other Ambulatory Visit: Payer: Self-pay | Admitting: Family Medicine

## 2017-11-30 ENCOUNTER — Other Ambulatory Visit: Payer: Self-pay | Admitting: Family Medicine

## 2017-12-26 IMAGING — DX DG CHEST 2V
2 series · 2 of 2 positions shown · non-contrast
Comparison: Chest radiograph performed 11/22/2010

CLINICAL DATA: Acute onset of nausea, vomiting and dry cough.
Headache and body aches. Initial encounter.

EXAM:
CHEST  2 VIEW

[chest pa]
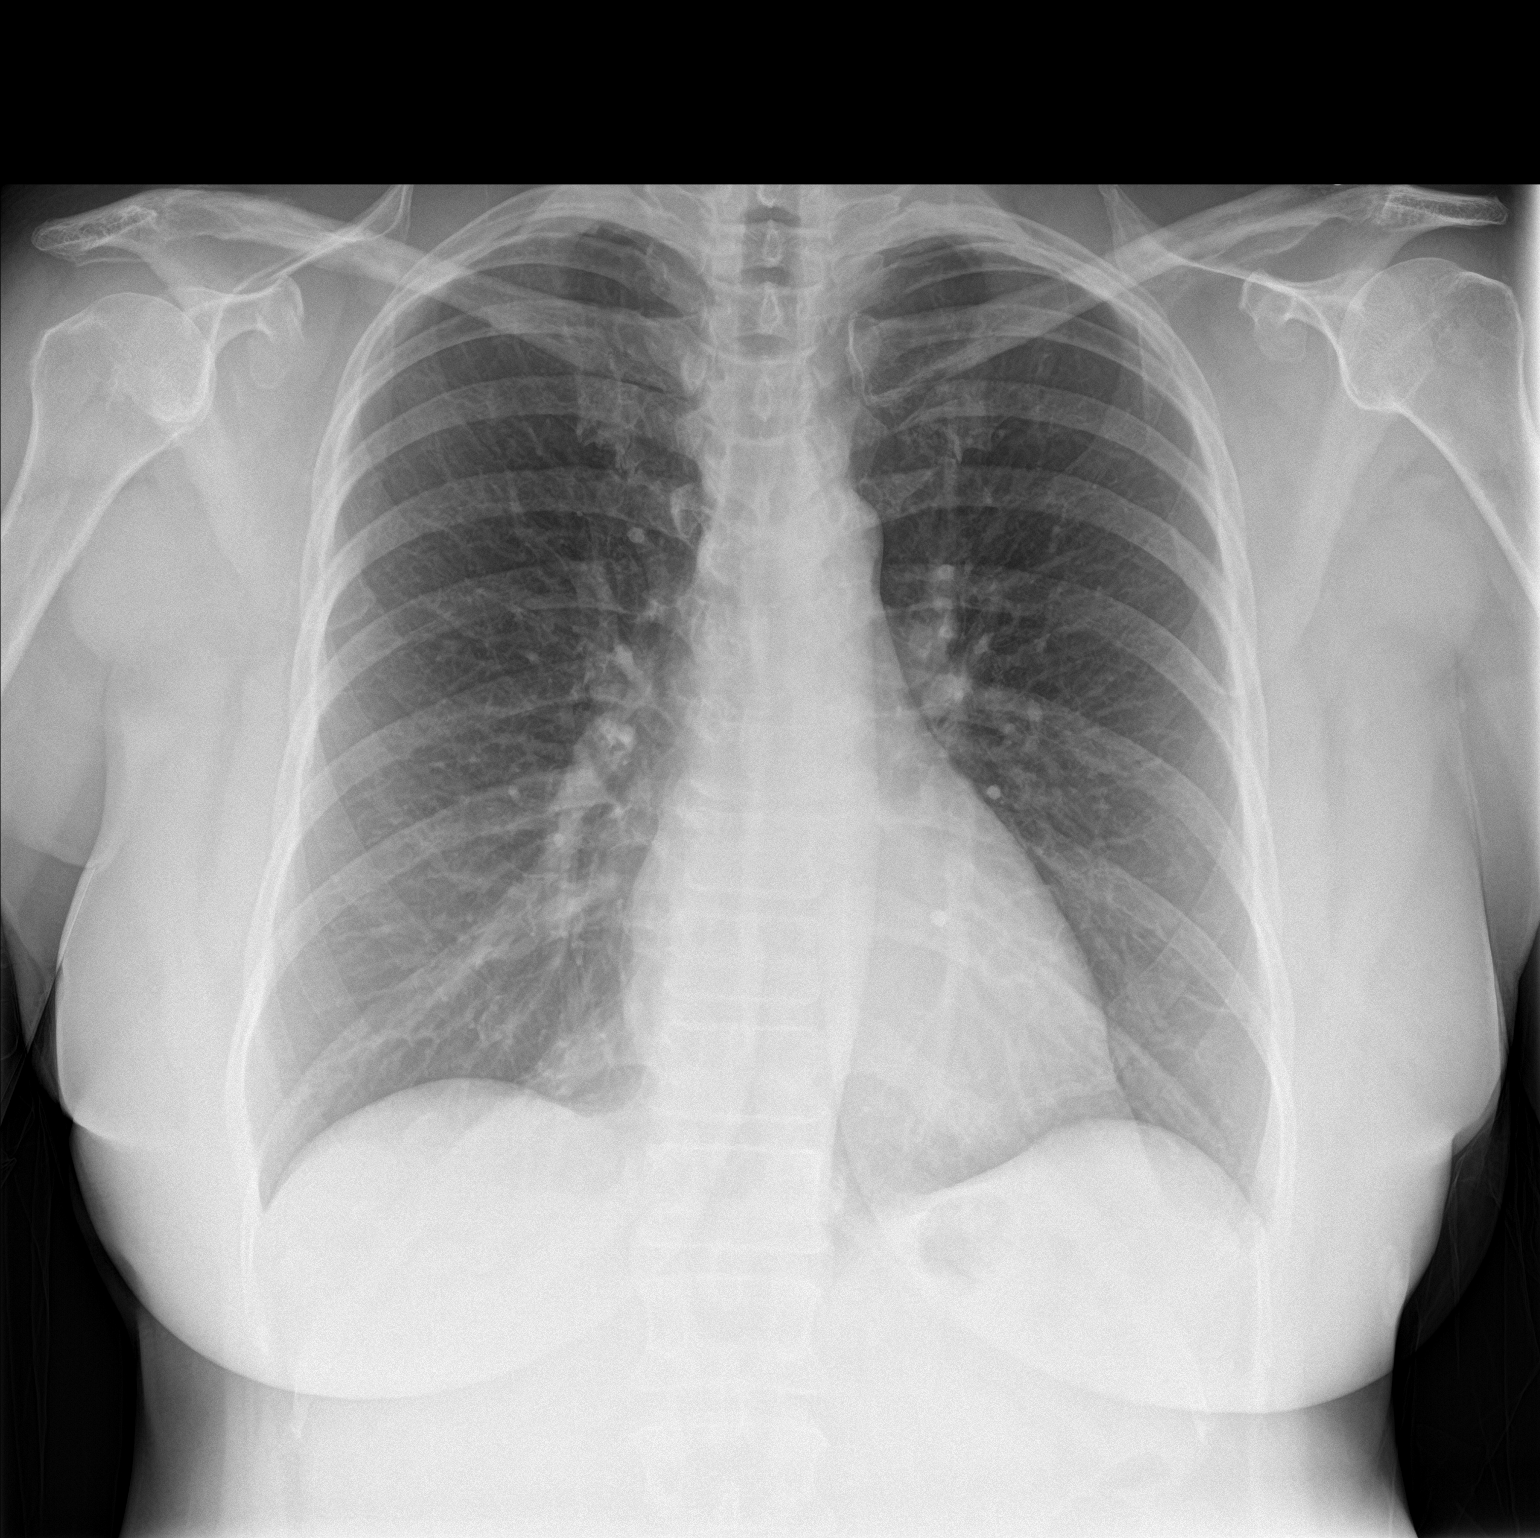

[chest lat]
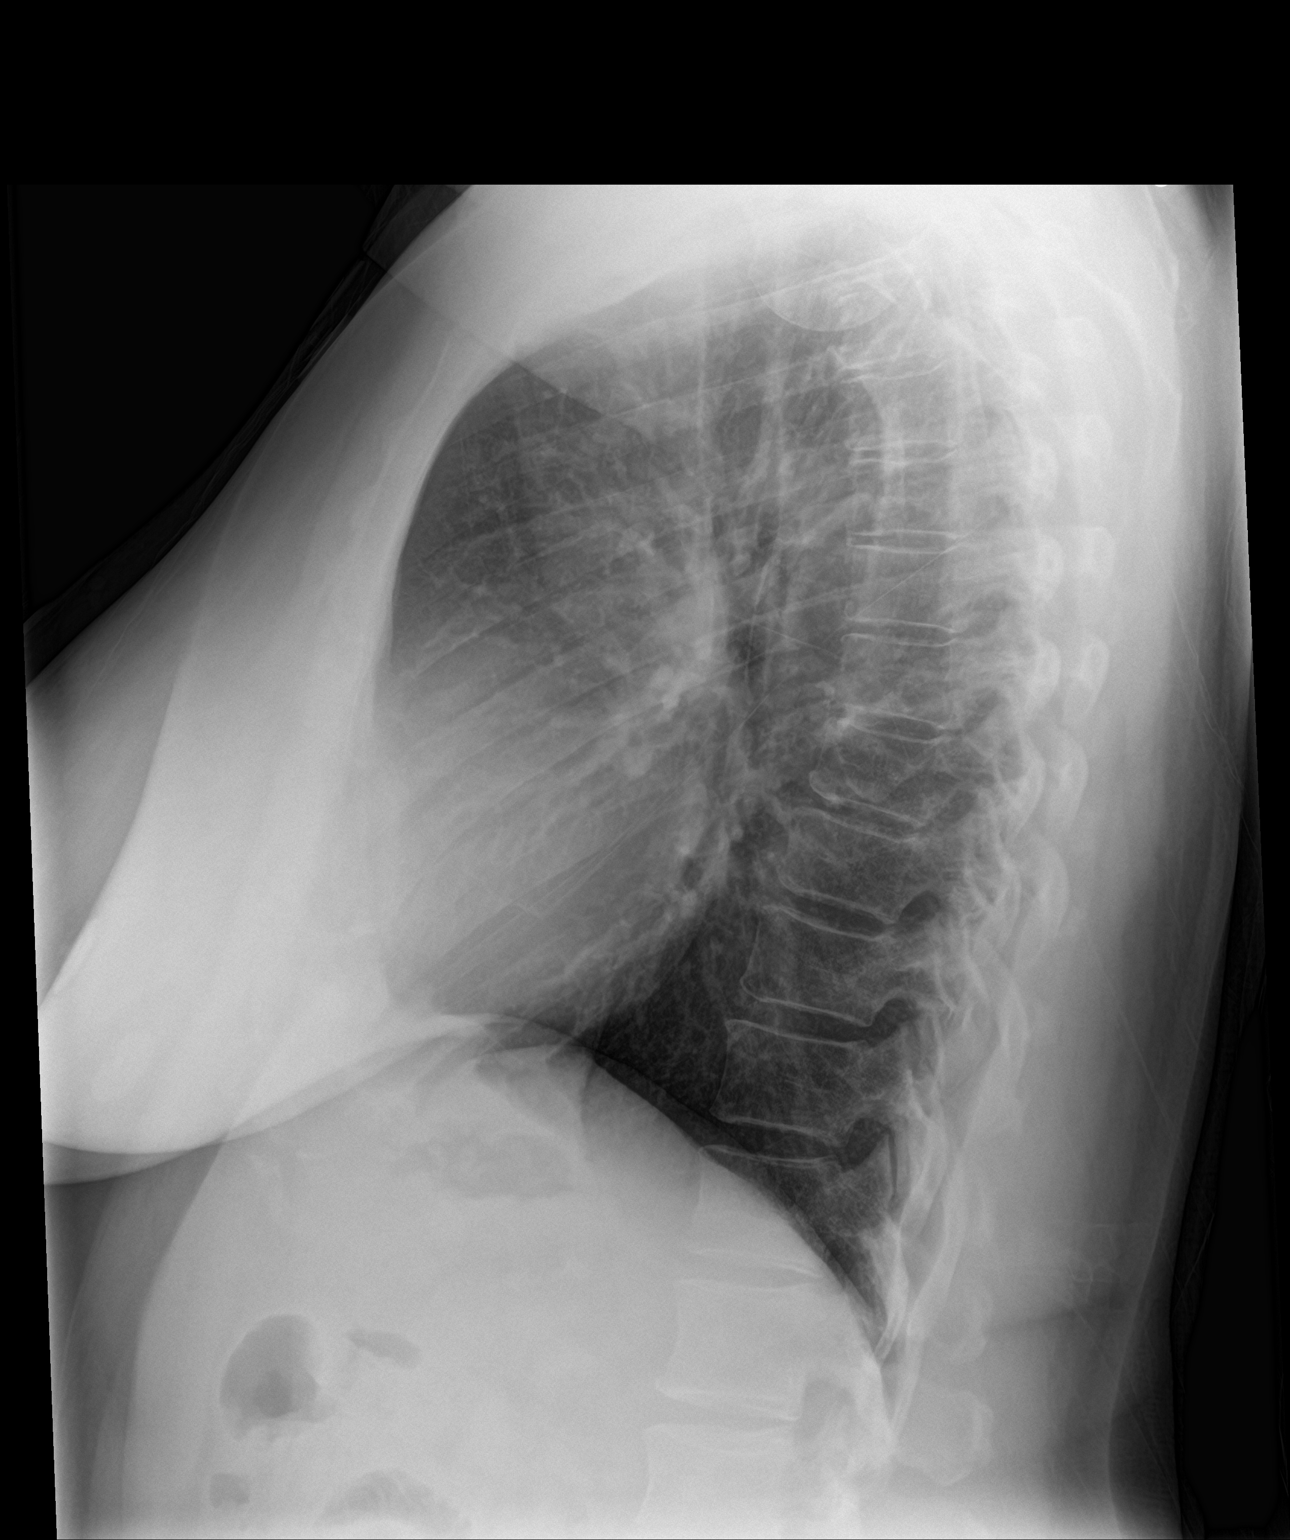

[2 of 2 positions shown; findings below may reference images not displayed]

FINDINGS: The lungs are well-aerated and clear. There is no evidence of focal
opacification, pleural effusion or pneumothorax.

The heart is normal in size; the mediastinal contour is within
normal limits. No acute osseous abnormalities are seen.
IMPRESSION: No acute cardiopulmonary process seen.

## 2018-02-02 ENCOUNTER — Ambulatory Visit (INDEPENDENT_AMBULATORY_CARE_PROVIDER_SITE_OTHER): Payer: Medicare HMO | Admitting: Family Medicine

## 2018-02-02 ENCOUNTER — Encounter: Payer: Self-pay | Admitting: Family Medicine

## 2018-02-02 VITALS — BP 122/80 | HR 100 | Ht 67.5 in | Wt 228.2 lb

## 2018-02-02 DIAGNOSIS — Z7189 Other specified counseling: Secondary | ICD-10-CM

## 2018-02-02 DIAGNOSIS — Z1159 Encounter for screening for other viral diseases: Secondary | ICD-10-CM | POA: Diagnosis not present

## 2018-02-02 DIAGNOSIS — F32A Depression, unspecified: Secondary | ICD-10-CM

## 2018-02-02 DIAGNOSIS — Z23 Encounter for immunization: Secondary | ICD-10-CM

## 2018-02-02 DIAGNOSIS — Z1239 Encounter for other screening for malignant neoplasm of breast: Secondary | ICD-10-CM

## 2018-02-02 DIAGNOSIS — E785 Hyperlipidemia, unspecified: Secondary | ICD-10-CM

## 2018-02-02 DIAGNOSIS — R7303 Prediabetes: Secondary | ICD-10-CM

## 2018-02-02 DIAGNOSIS — F419 Anxiety disorder, unspecified: Secondary | ICD-10-CM

## 2018-02-02 DIAGNOSIS — Z7185 Encounter for immunization safety counseling: Secondary | ICD-10-CM

## 2018-02-02 DIAGNOSIS — J4520 Mild intermittent asthma, uncomplicated: Secondary | ICD-10-CM

## 2018-02-02 DIAGNOSIS — Z0000 Encounter for general adult medical examination without abnormal findings: Secondary | ICD-10-CM

## 2018-02-02 DIAGNOSIS — F329 Major depressive disorder, single episode, unspecified: Secondary | ICD-10-CM

## 2018-02-02 LAB — POCT URINALYSIS DIP (PROADVANTAGE DEVICE)
Bilirubin, UA: NEGATIVE
Blood, UA: NEGATIVE
Glucose, UA: NEGATIVE mg/dL
Ketones, POC UA: NEGATIVE mg/dL
Leukocytes, UA: NEGATIVE
Nitrite, UA: NEGATIVE
Protein Ur, POC: NEGATIVE mg/dL
Specific Gravity, Urine: 1.01
Urobilinogen, Ur: NEGATIVE
pH, UA: 6 (ref 5.0–8.0)

## 2018-02-02 MED ORDER — HYDROXYZINE HCL 50 MG PO TABS: 50.0000 mg | ORAL_TABLET | Freq: Three times a day (TID) | ORAL | 0 refills | Status: DC | PRN

## 2018-02-02 MED ORDER — ALBUTEROL SULFATE HFA 108 (90 BASE) MCG/ACT IN AERS: 2.0000 | INHALATION_SPRAY | Freq: Four times a day (QID) | RESPIRATORY_TRACT | 1 refills | Status: DC | PRN

## 2018-02-02 MED ORDER — VENLAFAXINE HCL ER 75 MG PO CP24: 75.0000 mg | ORAL_CAPSULE | Freq: Every day | ORAL | 2 refills | Status: DC

## 2018-02-02 NOTE — Progress Notes (Signed)
[name] is a 57 y.o. female who presents for annual wellness visit, annual CPE and follow-up on chronic medical conditions.  She has the following concerns:  Complains of anxiety and depression with a history of bipolar per patient. Some days she does not want to get out of bed or do anything productive.  No longer going to her psychiatrist at [place] because she could not afford it.  Counseling at [facility] which is free for her. This is located in [place]. Has an appointment in 2 weeks.  States she is currently searching for a new psychiatrist and has contacted [facility] and others recommended by [facility].  [facility] - has been there in the past and did not like the care.   Taking Effexor 37.5 mg and hydroxyzine 50 mg currently and feels like this is helping her somewhat. Requests increase in Effexor.  States she is taking Adderall sparingly since losing her psychiatrist.  Out of Klonopin and does not want to go back on this. Off of it for 2 weeks.   Her daughter is a Marine scientist and is trying to help her.  Denies thoughts of self harm.   Lost her job. She is on disability for anxiety and depression.   She is not fasting today.   Asthma is exercise and stress induced. Taking symbicort as needed. Has been using the albuterol inhaler some days.   Prediabetes and would like to recheck this today.    Immunization History  Administered Date(s) Administered  . Influenza Whole 02/06/2009  . Influenza,inj,Quad PF,6+ Mos 01/28/2017, 02/02/2018  . Td 08/01/2005   Last Pap smear: 2 years ag0 at obgyn Last mammogram: 2016 Last colonoscopy: 07/2017 Last DEXA: never Dentist: October 14th 2019 Ophtho: October 2019 Exercise: some  Other doctors caring for patient include: [facility] Dentist- Dr. Luan Pulling GI- Dr. Fuller Plan ENT- Dr. Isaac Laud OB/GYN- Linda Hedges [facility] Cardiologist work up 6 years ago and negative.    Depression  screen:  See questionnaire below.  Depression screen [facility] 2/9 02/02/2018 01/28/2017  Decreased Interest 1 2  Down, Depressed, Hopeless 1 2  PHQ - 2 Score 2 4  Altered sleeping 3 2  Tired, decreased energy 1 2  Change in appetite 1 0  Feeling bad or failure about yourself  1 2  Trouble concentrating 1 2  Moving slowly or fidgety/restless 1 1  Suicidal thoughts 1 2  PHQ-9 Score 11 15  Difficult doing work/chores Somewhat difficult Somewhat difficult    Fall Risk Screen: see questionnaire below. Fall Risk  02/02/2018 01/28/2017  Falls in the past year? 0 No  Number falls in past yr: 0 -  Injury with Fall? 0 -    ADL screen:  See questionnaire below Functional Status Survey: Is the patient deaf or have difficulty hearing?: No Does the patient have difficulty seeing, even when wearing glasses/contacts?: No Does the patient have difficulty concentrating, remembering, or making decisions?: Yes Does the patient have difficulty walking or climbing stairs?: Yes Does the patient have difficulty dressing or bathing?: Yes(due to depression, just doesn't want to take a shower alot) Does the patient have difficulty doing errands alone such as visiting a doctor's office or shopping?: Yes   End of Life Discussion:  Patient does not have a living will and medical power of attorney. She will take the paperwork but does not want to fill these out today.   Review of Systems Constitutional: -fever, -chills, -sweats, -unexpected  weight change, -anorexia, -fatigue Allergy: -sneezing, -itching, -congestion Dermatology: denies changing moles, rash, lumps, new worrisome lesions ENT: -runny nose, -ear pain, -sore throat, -hoarseness, -sinus pain, -teeth pain, -tinnitus, -hearing loss, -epistaxis Cardiology:  -chest pain, -palpitations, -edema, -orthopnea, -paroxysmal nocturnal dyspnea Respiratory: -cough, -shortness of breath, -dyspnea on exertion, -wheezing, -hemoptysis Gastroenterology: -abdominal pain,  -nausea, -vomiting, -diarrhea, -constipation, -blood in stool, -changes in bowel movement, -dysphagia Hematology: -bleeding or bruising problems Musculoskeletal: -arthralgias, -myalgias, -joint swelling, -back pain, -neck pain, -cramping, -gait changes Ophthalmology: -vision changes, -eye redness, -itching, -discharge Urology: -dysuria, -difficulty urinating, -hematuria, -urinary frequency, -urgency, incontinence Neurology: -headache, -weakness, -tingling, -numbness, -speech abnormality, -memory loss, -falls, -dizziness Psychology:  +depressed mood, -agitation, -sleep problems    PHYSICAL EXAM:  BP 122/80   Pulse 100   Ht 5' 7.5" (1.715 m)   Wt 228 lb 3.2 oz (103.5 kg)   LMP 06/23/2014   BMI 35.21 kg/m   General Appearance: Alert, cooperative, no distress, appears stated age Head: Normocephalic, without obvious abnormality, atraumatic Eyes: PERRL, conjunctiva/corneas clear, EOM's intact, fundi benign Ears: Normal TM's and external ear canals Nose: Nares normal, mucosa normal, no drainage or sinus tenderness Throat: Lips, mucosa, and tongue normal; teeth and gums normal Neck: Supple, no lymphadenopathy; thyroid: no enlargement/tenderness/nodules; no carotid bruit or JVD Back: Spine nontender, no curvature, ROM normal, no CVA tenderness Lungs: Clear to auscultation bilaterally without wheezes, rales or ronchi; respirations unlabored Chest Wall: No tenderness or deformity Heart: Regular rate and rhythm, S1 and S2 normal, no murmur, rub or gallop Breast Exam: declines. Mammogram ordered.  Abdomen: Soft, non-tender, nondistended, normoactive bowel sounds, no masses, no hepatosplenomegaly Genitalia: declines. OB/GYN or may return for this.  Extremities: No clubbing, cyanosis or edema Pulses: 2+ and symmetric all extremities Skin: Skin color, texture, turgor normal, no rashes or lesions Lymph nodes: Cervical, supraclavicular, and axillary nodes normal Neurologic: CNII-XII intact,  normal strength, sensation and gait; reflexes 2+ and symmetric throughout Psych: Normal mood, affect, hygiene and grooming.  ASSESSMENT/PLAN: Medicare annual wellness visit, subsequent - Plan: POCT Urinalysis DIP (Proadvantage Device)  Dyslipidemia  Prediabetes - Plan: Hemoglobin A1c  Screening for breast cancer - Plan: MM DIGITAL SCREENING BILATERAL  Anxiety and depression - Plan: venlafaxine XR (EFFEXOR XR) 75 MG 24 hr capsule, hydrOXYzine (ATARAX/VISTARIL) 50 MG tablet  Mild intermittent asthma without complication - Plan: albuterol (PROVENTIL HFA;VENTOLIN HFA) 108 (90 Base) MCG/ACT inhaler  Routine general medical examination at a health care facility  Need for hepatitis C screening test - Plan: Hepatitis C antibody  Advance directive discussed with patient  Immunization counseling  Needs flu shot - Plan: Flu Vaccine QUAD 36+ mos IM  UA negative.  She is not fasting so she will return in 2 weeks for fasting lipids and for a pap smear if she decides to not go back to her OB/GYN Mammogram ordered.  Colonoscopy is UTD.  Immunizations- flu shot given. Prescriptions given for Tdap and Shingrix.  One time Hep C testing done.  Advance directives discussed. She declines filling these out.  Asthma is under good control. Refilled albuterol.  Prediabetes- check Hgb A1c and counseled on diet and exercise.  Anxiety and depression. It is unfortunate that she could not continue treatment at the [facility]. She is currently searching for a new psychiatrist and has a counseling appointment in 2 weeks. She does not appear to be self medicating and denies thoughts of self harm. effexor dose increased today and refilled her hydroxyzine. I am not refilling her Adderall and  she is ok with this. She is out of Klonopin and off of it for the past 2 weeks. She plans to stop this.  A list of psychiatrists was provided. Discussed going to [facility] if she needs immediate help. She agrees.  Follow up in 2  weeks. Fasting lipids and pap smear if needed. Also follow up on psych medications and status with psychiatrist search.    Discussed monthly self breast exams and yearly mammograms; at least 30 minutes of aerobic activity at least 5 days/week and weight-bearing exercise 2x/week; proper sunscreen use reviewed; healthy diet, including goals of calcium and vitamin D intake and alcohol recommendations (less than or equal to 1 drink/day) reviewed; regular seatbelt use; changing batteries in smoke detectors.  Immunization recommendations discussed.  Colonoscopy recommendations reviewed   Medicare Attestation I have personally reviewed: The patient's medical and social history Their use of alcohol, tobacco or illicit drugs Their current medications and supplements The patient's functional ability including ADLs,fall risks, home safety risks, cognitive, and hearing and visual impairment Diet and physical activities Evidence for depression or mood disorders  The patient's weight, height, and BMI have been recorded in the chart.  I have made referrals, counseling, and provided education to the patient based on review of the above and I have provided the patient with a written personalized care plan for preventive services.     Harland Dingwall, NP-C   02/02/2018

## 2018-02-02 NOTE — Patient Instructions (Addendum)
Call and schedule your mammogram.  Take the prescription to your pharmacy for the Tdap vaccine.  Call your insurance and get the Shingrix vaccine if affordable.   Return to see me in 2 weeks fasting. We can do your pap smear then if you would like.     You can call to schedule your appointment with the psychiatrist/counselor. A few offices are listed below for you to call.    [place] for [facility] psychiatrist  [place]  (across from [facility])  [facility]  [address] [place], [place], [place] [address]  Phone: [phone]   [place] [address] [place], [place] [address]  Phone: [phone]

## 2018-02-03 ENCOUNTER — Encounter: Payer: Self-pay | Admitting: Family Medicine

## 2018-02-03 LAB — HEMOGLOBIN A1C
Est. average glucose Bld gHb Est-mCnc: 137 mg/dL
Hgb A1c MFr Bld: 6.4 % — ABNORMAL HIGH (ref 4.8–5.6)

## 2018-02-03 LAB — HEPATITIS C ANTIBODY: Hep C Virus Ab: <0.1 s/co ratio (ref 0.0–0.9)

## 2018-02-12 ENCOUNTER — Telehealth: Payer: Self-pay | Admitting: Family Medicine

## 2018-02-12 ENCOUNTER — Ambulatory Visit (INDEPENDENT_AMBULATORY_CARE_PROVIDER_SITE_OTHER): Payer: Medicare HMO | Admitting: Family Medicine

## 2018-02-12 ENCOUNTER — Encounter: Payer: Self-pay | Admitting: Family Medicine

## 2018-02-12 VITALS — BP 124/80 | HR 84 | Temp 98.4°F | Resp 16

## 2018-02-12 DIAGNOSIS — R682 Dry mouth, unspecified: Secondary | ICD-10-CM | POA: Diagnosis not present

## 2018-02-12 DIAGNOSIS — K1379 Other lesions of oral mucosa: Secondary | ICD-10-CM | POA: Diagnosis not present

## 2018-02-12 NOTE — Telephone Encounter (Signed)
Pt coming in today

## 2018-02-12 NOTE — Progress Notes (Signed)
   Subjective:    Patient ID: [name], female    DOB: November 09, 1960, 57 y.o.   MRN: [id number]  HPI Chief Complaint  Patient presents with  . mouth breaking out    mouth breaking out. everything burns. has abcess and was suppose to see dentist on wednesday but couldn't go. thinks might be hydrozzine   She is here with complaints of right sided hard palate soreness and burning as well as right lower gum pain and redness. States she was supposed to see her dentist and have a tooth removed on the right lower side 2 days ago but she cancelled her appointment.  States she plans to go to her dentist office after leaving here and see if they will take a look at her mouth. No fever, chills, oral swelling or difficulty swallowing.   Taking hydroxyzine for anxiety. States she thinks it is causing her to have mouth sores. States it had this effect on her in the past.   States she has been dealing with her anxiety issues. Has upcoming appointment with counselor. No SI.   Denies  dizziness, chest pain, palpitations, shortness of breath, abdominal pain, N/V/D, urinary symptoms, LE edema.   Reviewed allergies, medications, past medical, surgical, family, and social history.  Review of Systems Pertinent positives and negatives in the history of present illness.     Objective:   Physical Exam  Constitutional: She appears well-developed and well-nourished. No distress.  HENT:  Nose: Nose normal.  Mouth/Throat: Uvula is midline and oropharynx is clear and moist. Mucous membranes are dry. Normal dentition. No posterior oropharyngeal edema.  Erythema to right side of her hard palate and right lower gum with small ulceration to gum line on right. No obvious abscess. Normal appearing tongue, throat and lips.   Eyes: Pupils are equal, round, and reactive to light. Conjunctivae are normal.  Neck: Normal range of motion. Neck supple.   BP 124/80   Pulse 84   Temp 98.4 F (36.9 C) (Oral)   Resp  16   LMP 06/23/2014   SpO2 98%         Assessment & Plan:  Acute pain of mouth  Dry mouth  No obvious infection. She plans to see her dentist and I think this is a good idea. Discussed that hydroxyzine may be causing her dry mouth.  Discussed salt water gargles and orajel.  Good oral hygiene and stay hydrated.  She will call if her dentist cannot see her today and I will cover her with an antibiotic in case she gets worse over the weekend.

## 2018-02-12 NOTE — Telephone Encounter (Signed)
Please call her and get more details. If she is having swelling of her lips, tongue then she should be seen right away. She can come on in if she wants.

## 2018-02-12 NOTE — Telephone Encounter (Signed)
  Patient called stating her mouth is breaking out and she feels it is something realted to her flu shot or the hydroxyzine and would like call back  Did advise pt that she may need to be evaluated , she requested call back first

## 2018-02-16 ENCOUNTER — Other Ambulatory Visit: Payer: Medicare HMO | Admitting: Family Medicine

## 2018-02-17 ENCOUNTER — Encounter: Payer: Self-pay | Admitting: Family Medicine

## 2018-02-17 ENCOUNTER — Ambulatory Visit (INDEPENDENT_AMBULATORY_CARE_PROVIDER_SITE_OTHER): Payer: Medicare HMO | Admitting: Family Medicine

## 2018-02-17 ENCOUNTER — Other Ambulatory Visit (HOSPITAL_COMMUNITY)
Admission: RE | Admit: 2018-02-17 | Discharge: 2018-02-17 | Disposition: A | Discharge status: Home / Self Care | Payer: Medicare HMO | Source: Ambulatory Visit | Attending: Family Medicine | Admitting: Family Medicine

## 2018-02-17 VITALS — BP 122/80 | HR 106 | Wt 226.6 lb

## 2018-02-17 DIAGNOSIS — E7800 Pure hypercholesterolemia, unspecified: Secondary | ICD-10-CM

## 2018-02-17 DIAGNOSIS — Z124 Encounter for screening for malignant neoplasm of cervix: Secondary | ICD-10-CM | POA: Insufficient documentation

## 2018-02-17 LAB — LIPID PANEL
Chol/HDL Ratio: 3 ratio (ref 0.0–4.4)
Cholesterol, Total: 196 mg/dL (ref 100–199)
HDL: 65 mg/dL (ref >39)
LDL Calculated: 108 mg/dL — ABNORMAL HIGH (ref 0–99)
Triglycerides: 113 mg/dL (ref 0–149)
VLDL Cholesterol Cal: 23 mg/dL (ref 5–40)

## 2018-02-17 NOTE — Progress Notes (Addendum)
   Subjective:    Patient ID: [name], female    DOB: Apr 06, 1960, 57 y.o.   MRN: [id number]  HPI Chief Complaint  Patient presents with  . pap    pap only with fasting cholesterol   She is here today for a pap smear and is fasting for lipid check.   Last pap smear 2-3 years ago at her OB/GYN office. Denies last abnormal pap smear was more than 10 years ago.   LMP: 2016  Denies fever, chills, abdominal pain, N/V/D, urinary symptoms, vaginal discharge.   Is not sexually active and has not been for several months.   History of elevated LDL and has never been on a statin.   Reviewed allergies, medications, past medical, surgical, family, and social history.    Review of Systems Pertinent positives and negatives in the history of present illness.     Objective:   Physical Exam  Constitutional: She appears well-developed and well-nourished. No distress.  Eyes: Conjunctivae are normal.  Genitourinary: Vagina normal and uterus normal. There is no rash, tenderness or lesion on the right labia. There is no rash, tenderness or lesion on the left labia. Cervix exhibits no motion tenderness, no discharge and no friability. Right adnexum displays no mass, no tenderness and no fullness. Left adnexum displays no mass, no tenderness and no fullness. No vaginal discharge found.  Lymphadenopathy: No inguinal adenopathy noted on the right or left side.  Skin: Skin is warm and dry.   BP 122/80   Pulse (!) 106   Wt 226 lb 9.6 oz (102.8 kg)   LMP 06/23/2014   BMI 34.97 kg/m       Assessment & Plan:  Encounter for Papanicolaou smear for cervical cancer screening - Plan: Cytology - PAP([place])  HYPERCHOLESTEROLEMIA, MILD - Plan: Lipid panel pap smear done, she tolerated this well. Chaperone present. Would like to add STD testing. Recent HIV negative. No recent sexual activity.  No new concerns today.  Follow up pending pap smear and lipid results.

## 2018-02-17 NOTE — Addendum Note (Signed)
Addended by: Girtha Rm on: 02/17/2018 09:41 AM   Modules accepted: Orders

## 2018-02-22 ENCOUNTER — Encounter: Payer: Self-pay | Admitting: Family Medicine

## 2018-02-22 LAB — CYTOLOGY - PAP
Chlamydia: NEGATIVE
Diagnosis: NEGATIVE
HPV: NOT DETECTED
Neisseria Gonorrhea: NEGATIVE

## 2018-03-05 ENCOUNTER — Telehealth: Payer: Self-pay | Admitting: Family Medicine

## 2018-03-05 NOTE — Telephone Encounter (Signed)
In response to medical records request sent, received fax back stating no pap available.

## 2018-03-06 ENCOUNTER — Other Ambulatory Visit: Payer: Self-pay | Admitting: Family Medicine

## 2018-03-06 DIAGNOSIS — F32A Depression, unspecified: Secondary | ICD-10-CM

## 2018-03-06 DIAGNOSIS — F419 Anxiety disorder, unspecified: Secondary | ICD-10-CM

## 2018-03-06 DIAGNOSIS — F329 Major depressive disorder, single episode, unspecified: Secondary | ICD-10-CM

## 2018-03-20 ENCOUNTER — Other Ambulatory Visit: Payer: Self-pay | Admitting: Family Medicine

## 2018-03-20 DIAGNOSIS — F32A Depression, unspecified: Secondary | ICD-10-CM

## 2018-03-20 DIAGNOSIS — F419 Anxiety disorder, unspecified: Secondary | ICD-10-CM

## 2018-03-20 DIAGNOSIS — F329 Major depressive disorder, single episode, unspecified: Secondary | ICD-10-CM

## 2018-03-22 ENCOUNTER — Ambulatory Visit
Admission: RE | Admit: 2018-03-22 | Discharge: 2018-03-22 | Disposition: A | Discharge status: Home / Self Care | Payer: Medicare HMO | Source: Ambulatory Visit | Attending: Family Medicine | Admitting: Family Medicine

## 2018-03-22 DIAGNOSIS — Z1239 Encounter for other screening for malignant neoplasm of breast: Secondary | ICD-10-CM

## 2018-03-22 MED ORDER — HYDROXYZINE HCL 50 MG PO TABS: 50.0000 mg | ORAL_TABLET | Freq: Three times a day (TID) | ORAL | 1 refills | Status: DC | PRN

## 2018-03-22 NOTE — Addendum Note (Signed)
Addended by: Minette Headland A on: 03/22/2018 09:50 AM   Modules accepted: Orders

## 2018-05-10 ENCOUNTER — Ambulatory Visit (INDEPENDENT_AMBULATORY_CARE_PROVIDER_SITE_OTHER): Payer: Medicare HMO | Admitting: Family Medicine

## 2018-05-10 ENCOUNTER — Encounter: Payer: Self-pay | Admitting: Family Medicine

## 2018-05-10 VITALS — BP 128/80 | HR 101 | Temp 98.6°F | Resp 16 | Wt 224.6 lb

## 2018-05-10 DIAGNOSIS — R059 Cough, unspecified: Secondary | ICD-10-CM

## 2018-05-10 DIAGNOSIS — R05 Cough: Secondary | ICD-10-CM | POA: Diagnosis not present

## 2018-05-10 DIAGNOSIS — J4520 Mild intermittent asthma, uncomplicated: Secondary | ICD-10-CM | POA: Diagnosis not present

## 2018-05-10 MED ORDER — BUDESONIDE-FORMOTEROL FUMARATE 160-4.5 MCG/ACT IN AERO: 2.0000 | INHALATION_SPRAY | Freq: Two times a day (BID) | RESPIRATORY_TRACT | 0 refills | Status: DC

## 2018-05-10 MED ORDER — AZITHROMYCIN 250 MG PO TABS: ORAL_TABLET | ORAL | 0 refills | Status: DC

## 2018-05-10 MED ORDER — PREDNISONE 10 MG (21) PO TBPK: ORAL_TABLET | Freq: Every day | ORAL | 0 refills | Status: DC

## 2018-05-10 MED ORDER — BENZONATATE 200 MG PO CAPS: 200.0000 mg | ORAL_CAPSULE | Freq: Two times a day (BID) | ORAL | 0 refills | Status: DC | PRN

## 2018-05-10 MED ORDER — ALBUTEROL SULFATE HFA 108 (90 BASE) MCG/ACT IN AERS: 2.0000 | INHALATION_SPRAY | Freq: Four times a day (QID) | RESPIRATORY_TRACT | 0 refills | Status: DC | PRN

## 2018-05-10 NOTE — Patient Instructions (Signed)
Take the antibiotic and steroids as prescribed. Take the steroids early in the day so that they do not keep you awake.  Stay well-hydrated.  Use the Symbicort twice daily and rinse your mouth out after each use since this contains a steroid.  Use your albuterol as needed.  You may continue with over-the-counter cold medications and you may also try the Tessalon Perles for cough if this is affordable.  Let us know if you are getting worse or if you are not back to baseline in a couple of weeks.

## 2018-05-10 NOTE — Progress Notes (Signed)
Chief Complaint  Patient presents with  . possible bronchitis    coughing for 2 weeks, shortness of breath. fatigue    Subjective:  [name] is a 58 y.o. female who presents for a 2 week history of cough, right ear pain, frontal headache,   Underlying asthma and just started back on Symbicort for the past week and using albuterol minimally. Only used it yesterday. Asthma is generally well controlled and triggered by stress and illness per patient.   Denies fever, chills, sore throat, abdominal pain, N/V/D. No leg pain or swelling.   No recent travel.   Treatment to date: Mucinex DM, Tussin.  Denies sick contacts.  No other aggravating or relieving factors.  No other c/o.  ROS as in subjective.   Objective: Vitals:   05/10/18 1339  BP: 128/80  Pulse: (!) 101  Resp: 16  Temp: 98.6 F (37 C)  SpO2: 97%    General appearance: Alert, WD/WN, no distress, mildly ill appearing                             Skin: warm, no rash                           Head: +frontal and maxillary sinus tenderness                            Eyes: conjunctiva normal, corneas clear, PERRLA                            Ears: pearly TMs, external ear canals normal                          Nose: septum midline, turbinates swollen, with erythema and clear discharge             Mouth/throat: MMM, tongue normal, mild pharyngeal erythema                           Neck: supple, no adenopathy, no thyromegaly, nontender                          Heart: RRR, normal S1, S2, no murmurs                         Lungs: CTA bilaterally, no wheezes, rales, or rhonchi, normal work of breathing   Extremities: no edema or asymmetry.       Assessment: Mild intermittent asthmatic bronchitis without complication - Plan: azithromycin (ZITHROMAX Z-PAK) 250 MG tablet, predniSONE (STERAPRED UNI-PAK 21 TAB) 10 MG (21) TBPK tablet  Cough - Plan: benzonatate (TESSALON) 200 MG capsule, predniSONE (STERAPRED UNI-PAK 21 TAB)  10 MG (21) TBPK tablet  Mild intermittent asthma without complication - Plan: albuterol (PROVENTIL HFA;VENTOLIN HFA) 108 (90 Base) MCG/ACT inhaler    Plan: Discussed diagnosis and treatment of asthmatic bronchitis. Questionable sinusitis as well.  No acute distress. Z-pak, Tessalon and prednisone prescribed. Another sample of Symbicort was given to her. Refilled albuterol.  Suggested symptomatic OTC remedies. Tylenol or Ibuprofen OTC for fever and malaise.  Call/return in 2-3 days if worsening or not back to baseline after completing treatment.

## 2018-06-11 ENCOUNTER — Other Ambulatory Visit: Payer: Self-pay | Admitting: Family Medicine

## 2018-06-11 ENCOUNTER — Ambulatory Visit (INDEPENDENT_AMBULATORY_CARE_PROVIDER_SITE_OTHER): Payer: Medicare HMO | Admitting: Family Medicine

## 2018-06-11 ENCOUNTER — Encounter: Payer: Self-pay | Admitting: Family Medicine

## 2018-06-11 ENCOUNTER — Other Ambulatory Visit: Payer: Self-pay

## 2018-06-11 VITALS — BP 120/72 | HR 118 | Wt 226.8 lb

## 2018-06-11 DIAGNOSIS — F410 Panic disorder [episodic paroxysmal anxiety] without agoraphobia: Secondary | ICD-10-CM | POA: Diagnosis not present

## 2018-06-11 DIAGNOSIS — F329 Major depressive disorder, single episode, unspecified: Secondary | ICD-10-CM

## 2018-06-11 DIAGNOSIS — F319 Bipolar disorder, unspecified: Secondary | ICD-10-CM | POA: Diagnosis not present

## 2018-06-11 DIAGNOSIS — F32A Depression, unspecified: Secondary | ICD-10-CM

## 2018-06-11 DIAGNOSIS — F419 Anxiety disorder, unspecified: Secondary | ICD-10-CM | POA: Diagnosis not present

## 2018-06-11 NOTE — Telephone Encounter (Signed)
Pt is not out of med. Does not take this everyday

## 2018-06-11 NOTE — Progress Notes (Signed)
   Subjective:    Patient ID: [name], female    DOB: October 14, 1960, 58 y.o.   MRN: [id number]  HPI Chief Complaint  Patient presents with  . having panic attacks    having panic attacks, getting overwhelmed   She is a 58 year old female with a history of anxiety, depression, bipolar whoe is here with complaints of anxiety and panic attacks. States her daughter was at her house. Her daughter is a travel Marine scientist. Since she has left she reports anxiety level improving. She almost cancelled today because she is feeling better.  States she cannot see her psychiatrist again until she pays her bill. Has not followed up with her counselor. States she did not like who they put her with last time.   She reports taking medication as she thinks she needs to take them and not as prescribed. States she started back taking Effexor 75 mg daily  5 days ago. Prior to then she was taking it sporadically and some days she doubled up on her dose.   States she is taking leftover Remeron some days.  She would like to have clonazepam but states her psychiatrist would not prescribe this for her.  She had leg pain on Vraylor so she stopped it.   Taking OTC [place] Robinsons for stress.   Dr. Rosine Door is her psychiatrist.  [facility] - counseling.   Denies thoughts of self harm.   Denies fever, chills, dizziness, chest pain, palpitations, shortness of breath, abdominal pain, N/V/D.    Review of Systems Pertinent positives and negatives in the history of present illness.     Objective:   Physical Exam BP 120/72   Pulse (!) 118   Wt 226 lb 12.8 oz (102.9 kg)   LMP 06/23/2014   BMI 35.00 kg/m   Alert and oriented and in no acute distress. Denies SI or HI.       Assessment & Plan:  Anxiety and depression  Bipolar affective disorder, remission status unspecified ([place])  Panic attacks  She does not appear to be in any danger.  Denies thoughts of self-harm. Advised her against self-medicating  and recommend that she take the medications as prescribed.  Discussed that Effexor does not work by taking it as needed.  Strongly encouraged her to stay on the current dose for at least 4 weeks to see if she notices any improvement.  Discussed that I am not willing to change her psychotropic medications.  She is aware that I would not prescribe Klonopin for her.  She will need to get an appointment with her psychiatrist.  I also recommend seeing a counselor.  Follow-up as needed

## 2018-08-02 ENCOUNTER — Telehealth: Payer: Self-pay | Admitting: Family Medicine

## 2018-08-02 NOTE — Telephone Encounter (Signed)
Spoke with [name], she wants patient to self quarantine for 14 days .  Call to get tested after 5 - 7 days.  Let us know the results.  Also let us know if she becomes symptomatic.  Pt is disabled and lives alone.  However, she was just talking with her younger sister, face to face.  Her grandson has been living at her house for the past week.  Her friend that is positive has been living in and out of her house while her grandson has been there.  She spent Mother's day at daughter's house.  I told her to make a list of people she has been around since the exposure.  Patient will keep [name] informed.Gave her phone number for [facility] testing site [phone].

## 2018-08-02 NOTE — Telephone Encounter (Signed)
Pt called and states she was around a friend all weekend.  He lives/works at a recovery rehab facility.  They were all tested last week and came back positive today for Covid 19.  He was not symptomatic.  [name] is calling wanting to know what she should do.  They were masked but spent the entire day of Mother's day together, cooking, etc.  She is not showing any symptoms.  Pt ph [phone]

## 2018-08-05 DIAGNOSIS — Z008 Encounter for other general examination: Secondary | ICD-10-CM | POA: Diagnosis not present

## 2018-08-11 DIAGNOSIS — F330 Major depressive disorder, recurrent, mild: Secondary | ICD-10-CM | POA: Diagnosis not present

## 2018-08-11 DIAGNOSIS — F4312 Post-traumatic stress disorder, chronic: Secondary | ICD-10-CM | POA: Diagnosis not present

## 2018-08-11 DIAGNOSIS — R69 Illness, unspecified: Secondary | ICD-10-CM | POA: Diagnosis not present

## 2018-08-18 ENCOUNTER — Other Ambulatory Visit: Payer: Self-pay

## 2018-08-18 ENCOUNTER — Ambulatory Visit (INDEPENDENT_AMBULATORY_CARE_PROVIDER_SITE_OTHER): Payer: Medicare HMO | Admitting: Family Medicine

## 2018-08-18 ENCOUNTER — Encounter: Payer: Self-pay | Admitting: Family Medicine

## 2018-08-18 VITALS — Wt 230.0 lb

## 2018-08-18 DIAGNOSIS — F32A Depression, unspecified: Secondary | ICD-10-CM

## 2018-08-18 DIAGNOSIS — R7303 Prediabetes: Secondary | ICD-10-CM | POA: Diagnosis not present

## 2018-08-18 DIAGNOSIS — Z79899 Other long term (current) drug therapy: Secondary | ICD-10-CM | POA: Diagnosis not present

## 2018-08-18 DIAGNOSIS — J4520 Mild intermittent asthma, uncomplicated: Secondary | ICD-10-CM

## 2018-08-18 DIAGNOSIS — E669 Obesity, unspecified: Secondary | ICD-10-CM

## 2018-08-18 DIAGNOSIS — R69 Illness, unspecified: Secondary | ICD-10-CM | POA: Diagnosis not present

## 2018-08-18 DIAGNOSIS — F329 Major depressive disorder, single episode, unspecified: Secondary | ICD-10-CM

## 2018-08-18 DIAGNOSIS — F419 Anxiety disorder, unspecified: Secondary | ICD-10-CM | POA: Diagnosis not present

## 2018-08-18 NOTE — Progress Notes (Signed)
   Subjective:   Documentation for virtual audio and video telecommunications Doximity encounter:  The patient was located at a hotel in [place].  2 patient identifiers were used. The provider was located in the office. The patient did consent to this visit and is aware of possible charges through their insurance for this visit.  The other persons participating in this telemedicine service were none.    Patient ID: [name], female    DOB: May 20, 1960, 58 y.o.   MRN: [id number]  HPI Chief Complaint  Patient presents with  . med check.    med check, no concerns   She is due for a medication management visit.  She has no new concerns today.  She is with her daughter in [place].  Her daughter is a travel Marine scientist.  She is now under the care of a mental health professional for anxiety and depression as well of bipolar disorder. States she is seeing a Designer, jewellery at [facility] in [place]. Last there on May 20th. Seeing them again in June 2020.   Asthma- stress and exercise induced. Is not using Symbicort Albuterol use is once week with exercise.  Uses Flonase for allergies   Prediabetes- last Hgb A1c 6.4%  States her diet has not been very healthy.  She has not been exercising. She does not check her blood sugars.  Other providers:  GI- Dr. Fuller Plan ENT- Dr. Isaac Laud OB/GYN- Linda Hedges at [facility]   Review of Systems Pertinent positives and negatives in the history of present illness.     Objective:   Physical Exam Wt 230 lb (104.3 kg)   LMP 06/23/2014   BMI 35.49 kg/m   Alert and oriented and in no acute distress.  Respirations unlabored.  Normal speech, mood and thought process.  Unable to further examine her due to this being a virtual visit.      Assessment & Plan:  Mild intermittent asthma without complication  Anxiety and depression  Prediabetes  Obesity (BMI 30-39.9)  Medication management   Discussed limitations of a virtual visit. She appears to be in her usual state of health and has no new concerns today. Discussed that I will leave her psychotropic medications up to her new mental health specialist and she is fine with this plan. Asthma is only an issue with exercise and she is not needing her albuterol inhaler since she has not been exercising lately.  She is no longer using Symbicort. Continue using her Flonase for allergies. Discussed that her last A1c was 6.4% and that we do need to follow-up on this in the near future. She will call and schedule appointment preferably in the office to discuss chronic health conditions and for labs.  Discussed that she will be due for Medicare AWV in mid November  Time spent on call was 16 minutes and in review of previous records 2 minutes total.  This virtual service is not related to other E/M service within previous 7 days.  99441 (5-68min) 99442 (11-47min) 99443 (21-17min)

## 2018-09-08 DIAGNOSIS — R69 Illness, unspecified: Secondary | ICD-10-CM | POA: Diagnosis not present

## 2018-09-08 DIAGNOSIS — F4312 Post-traumatic stress disorder, chronic: Secondary | ICD-10-CM | POA: Diagnosis not present

## 2018-09-08 DIAGNOSIS — F330 Major depressive disorder, recurrent, mild: Secondary | ICD-10-CM | POA: Diagnosis not present

## 2018-09-15 DIAGNOSIS — R69 Illness, unspecified: Secondary | ICD-10-CM | POA: Diagnosis not present

## 2018-09-22 DIAGNOSIS — R69 Illness, unspecified: Secondary | ICD-10-CM | POA: Diagnosis not present

## 2018-09-30 ENCOUNTER — Ambulatory Visit (INDEPENDENT_AMBULATORY_CARE_PROVIDER_SITE_OTHER): Payer: Medicare HMO | Admitting: Family Medicine

## 2018-09-30 ENCOUNTER — Other Ambulatory Visit: Payer: Self-pay

## 2018-09-30 ENCOUNTER — Encounter: Payer: Self-pay | Admitting: Family Medicine

## 2018-09-30 VITALS — Wt 236.0 lb

## 2018-09-30 DIAGNOSIS — R05 Cough: Secondary | ICD-10-CM

## 2018-09-30 DIAGNOSIS — J3489 Other specified disorders of nose and nasal sinuses: Secondary | ICD-10-CM | POA: Diagnosis not present

## 2018-09-30 DIAGNOSIS — R5383 Other fatigue: Secondary | ICD-10-CM

## 2018-09-30 DIAGNOSIS — R110 Nausea: Secondary | ICD-10-CM

## 2018-09-30 DIAGNOSIS — R140 Abdominal distension (gaseous): Secondary | ICD-10-CM | POA: Diagnosis not present

## 2018-09-30 DIAGNOSIS — M545 Low back pain, unspecified: Secondary | ICD-10-CM

## 2018-09-30 DIAGNOSIS — R058 Other specified cough: Secondary | ICD-10-CM

## 2018-09-30 MED ORDER — ONDANSETRON 4 MG PO TBDP: 4.0000 mg | ORAL_TABLET | Freq: Three times a day (TID) | ORAL | 0 refills | Status: DC | PRN

## 2018-09-30 NOTE — Progress Notes (Signed)
   Subjective:   Documentation for virtual audio and video telecommunications through [place] encounter:  The patient was located at home. 2 patient identifiers used.  The provider was located in the office. The patient did consent to this visit and is aware of possible charges through their insurance for this visit.  The other persons participating in this telemedicine service were none.    Patient ID: [name], female    DOB: [birth year], 58 y.o.   MRN: [id number]  HPI Chief Complaint  Patient presents with  . constipation and weak    constipation and weak, stomach is bloated. doing fiber diet. no energy, having some bowel movements   Complains of a one week history of nausea, sweating, bloating, belching, fatigue, irritability.  States prior to onset of symptoms, she had a large milkshake from [employer] and then several glasses of [place]. She also had BBQ.   States she had several bowel movements after eating this combination of food/drinks.   Sates she is improving. Took omeprazole. Drinking plenty of water,   Upper abdomen feels "sore".   She took extra fiber. Has history of IBS. Has been eating more salads lately.   Also complains of rhinorrhea, hoarseness, sore throat, dry cough for the past 5 days. No fever, chills, headache, dizziness, chest pain, shortness of breath, vomiting. No blood or pus in stool.  Denies exposure to Covid-19. She thinks this is related to her underlying allergies.   Reviewed allergies, medications, past medical, surgical, family, and social history.    Review of Systems Pertinent positives and negatives in the history of present illness.     Objective:   Physical Exam Wt 236 lb (107 kg)   LMP 06/23/2014   BMI 36.42 kg/m   Alert and oriented and in no acute distress. Respirations unlabored. Unable to further examine.       Assessment & Plan:  Nausea - Plan: ondansetron (ZOFRAN ODT) 4 MG disintegrating tablet. Encouraged her  to avoid foods that cause her upset stomach such as dairy and salads.   Abdominal bloating - Plan: no red flag symptoms. She is able to tolerate fluids and food. Encouraged her to avoid foods that cause her upset stomach such as dairy and salads.  Fatigue, unspecified type - Plan: most likely related to recent GI upset and diarrhea.   Acute bilateral low back pain without sciatica - Plan: she will treat this conservatively.   Rhinorrhea - Plan: treat allergies.   Dry cough - Plan: treat allergies. Recommend Covid-19 testing due to her symptoms. She declines and would like to hold off. She will quarantine and follow up if not improving or worsening.    Time spent on call was 23 minutes and in review of previous records 2 minutes total.  This virtual service is not related to other E/M service within previous 7 days.

## 2018-10-06 DIAGNOSIS — R69 Illness, unspecified: Secondary | ICD-10-CM | POA: Diagnosis not present

## 2018-10-13 DIAGNOSIS — R69 Illness, unspecified: Secondary | ICD-10-CM | POA: Diagnosis not present

## 2018-10-15 ENCOUNTER — Other Ambulatory Visit: Payer: Self-pay

## 2018-10-15 ENCOUNTER — Telehealth: Payer: Self-pay

## 2018-10-15 ENCOUNTER — Ambulatory Visit (INDEPENDENT_AMBULATORY_CARE_PROVIDER_SITE_OTHER): Payer: Medicare HMO | Admitting: Family Medicine

## 2018-10-15 ENCOUNTER — Encounter: Payer: Self-pay | Admitting: Family Medicine

## 2018-10-15 VITALS — Temp 98.1°F | Wt 236.0 lb

## 2018-10-15 DIAGNOSIS — K582 Mixed irritable bowel syndrome: Secondary | ICD-10-CM

## 2018-10-15 MED ORDER — HYOSCYAMINE SULFATE ER 0.375 MG PO TB12: 0.3750 mg | ORAL_TABLET | Freq: Two times a day (BID) | ORAL | 1 refills | Status: DC | PRN

## 2018-10-15 MED ORDER — OMEPRAZOLE 20 MG PO CPDR: 20.0000 mg | DELAYED_RELEASE_CAPSULE | Freq: Every day | ORAL | 3 refills | Status: DC

## 2018-10-15 NOTE — Progress Notes (Signed)
   Subjective:    Patient ID: [name], female    DOB: 03-20-1961, 58 y.o.   MRN: [id number]  HPI Documentation for virtual telephone encounter. Documentation for virtual audio and video telecommunications through [place] encounter: The patient was located at home. The provider was located in the office. The patient did consent to this visit and is aware of possible charges through their insurance for this visit. The other persons participating in this telemedicine service were none. Time spent on call was 5 minutes and in review of previous records >15 minutes total. This virtual service is not related to other E/M service within previous 7 days. She states that since July 10 when she ate some food did not agree with her she has had difficulty with bloating, alternating constipation and diarrhea as well as weakness and some nausea.  She was given Zofran but still is having difficulty with this.  In the past she had used Prilosec as well as MiraLAX and hyoscyamine to help with this. She recognizes that anxiety does play a role in this.  She notes that clonazepam has helped with this in the past.   Review of Systems     Objective:   Physical Exam Alert and in no distress otherwise not examined      Assessment & Plan:  Irritable bowel syndrome with both constipation and diarrhea - Plan: hyoscyamine (LEVBID) 0.375 MG 12 hr tablet,  Recommend she use a bulk laxative.  She apparently has some selenium there.  Also recommend Prilosec 40 mg/day as well as the Levbid.  Discussed anxiety with her

## 2018-10-15 NOTE — Telephone Encounter (Signed)
Pt called and stated she was suppose to get omeprazole sent to the pharmacy for her but it was never sent. Please advise to sent to [facility] which has been updated in the chart.

## 2018-10-20 ENCOUNTER — Ambulatory Visit (INDEPENDENT_AMBULATORY_CARE_PROVIDER_SITE_OTHER): Payer: Medicare HMO | Admitting: Family Medicine

## 2018-10-20 ENCOUNTER — Other Ambulatory Visit: Payer: Self-pay

## 2018-10-20 VITALS — BP 124/84 | HR 113 | Temp 98.9°F | Wt 242.0 lb

## 2018-10-20 DIAGNOSIS — R0789 Other chest pain: Secondary | ICD-10-CM | POA: Diagnosis not present

## 2018-10-20 NOTE — Progress Notes (Signed)
   Subjective:    Patient ID: [name], female    DOB: 1960-11-30, 58 y.o.   MRN: [id number]  HPI She is here for evaluation of continued difficulty with chest tightness, weakness, jaw pain, chills, numb feeling to the right side of her face and arms.  Occasional diaphoresis.  No coughing, sore throat or earache.  She does see her counselor weekly for appointments.  Presently she is on Neurontin and Effexor.   Review of Systems     Objective:   Physical Exam  Alert and in no distress. Tympanic membranes and canals are normal. Pharyngeal area is normal. Neck is supple without adenopathy or thyromegaly. Cardiac exam shows a regular sinus rhythm without murmurs or gallops. Lungs are clear to auscultation.       Assessment & Plan:   Encounter Diagnosis  Name Primary?  . Chest tightness Yes  I explained that I thought all of her symptoms are really psychological in nature and have her continue to work with her therapist.  She seemed amenable to this.

## 2018-10-27 DIAGNOSIS — R69 Illness, unspecified: Secondary | ICD-10-CM | POA: Diagnosis not present

## 2018-11-01 DIAGNOSIS — F330 Major depressive disorder, recurrent, mild: Secondary | ICD-10-CM | POA: Diagnosis not present

## 2018-11-01 DIAGNOSIS — R69 Illness, unspecified: Secondary | ICD-10-CM | POA: Diagnosis not present

## 2018-11-01 DIAGNOSIS — F4312 Post-traumatic stress disorder, chronic: Secondary | ICD-10-CM | POA: Diagnosis not present

## 2018-11-06 ENCOUNTER — Other Ambulatory Visit: Payer: Self-pay | Admitting: Family Medicine

## 2018-11-06 DIAGNOSIS — K582 Mixed irritable bowel syndrome: Secondary | ICD-10-CM

## 2018-11-10 DIAGNOSIS — R69 Illness, unspecified: Secondary | ICD-10-CM | POA: Diagnosis not present

## 2018-11-24 DIAGNOSIS — R69 Illness, unspecified: Secondary | ICD-10-CM | POA: Diagnosis not present

## 2018-12-07 ENCOUNTER — Ambulatory Visit (INDEPENDENT_AMBULATORY_CARE_PROVIDER_SITE_OTHER): Payer: Medicare HMO | Admitting: Family Medicine

## 2018-12-07 ENCOUNTER — Other Ambulatory Visit: Payer: Self-pay | Admitting: Family Medicine

## 2018-12-07 ENCOUNTER — Encounter: Payer: Self-pay | Admitting: Family Medicine

## 2018-12-07 ENCOUNTER — Other Ambulatory Visit: Payer: Self-pay

## 2018-12-07 VITALS — BP 120/82 | HR 82 | Temp 97.6°F | Wt 242.2 lb

## 2018-12-07 DIAGNOSIS — N941 Unspecified dyspareunia: Secondary | ICD-10-CM

## 2018-12-07 DIAGNOSIS — B9689 Other specified bacterial agents as the cause of diseases classified elsewhere: Secondary | ICD-10-CM

## 2018-12-07 DIAGNOSIS — R350 Frequency of micturition: Secondary | ICD-10-CM

## 2018-12-07 DIAGNOSIS — Z86018 Personal history of other benign neoplasm: Secondary | ICD-10-CM | POA: Diagnosis not present

## 2018-12-07 DIAGNOSIS — Z9189 Other specified personal risk factors, not elsewhere classified: Secondary | ICD-10-CM | POA: Diagnosis not present

## 2018-12-07 DIAGNOSIS — N950 Postmenopausal bleeding: Secondary | ICD-10-CM

## 2018-12-07 DIAGNOSIS — N760 Acute vaginitis: Secondary | ICD-10-CM | POA: Diagnosis not present

## 2018-12-07 DIAGNOSIS — N898 Other specified noninflammatory disorders of vagina: Secondary | ICD-10-CM | POA: Diagnosis not present

## 2018-12-07 DIAGNOSIS — R7303 Prediabetes: Secondary | ICD-10-CM | POA: Diagnosis not present

## 2018-12-07 LAB — POCT WET PREP (WET MOUNT)
Clue Cells Wet Prep Whiff POC: NEGATIVE
Trichomonas Wet Prep HPF POC: ABSENT

## 2018-12-07 LAB — POCT URINALYSIS DIP (PROADVANTAGE DEVICE)
Bilirubin, UA: NEGATIVE
Blood, UA: NEGATIVE
Glucose, UA: NEGATIVE mg/dL
Ketones, POC UA: NEGATIVE mg/dL
Leukocytes, UA: NEGATIVE
Nitrite, UA: NEGATIVE
Specific Gravity, Urine: 1.03
Urobilinogen, Ur: NEGATIVE
pH, UA: 6 (ref 5.0–8.0)

## 2018-12-07 MED ORDER — FLUCONAZOLE 150 MG PO TABS: 150.0000 mg | ORAL_TABLET | Freq: Once | ORAL | 0 refills | Status: AC

## 2018-12-07 MED ORDER — METRONIDAZOLE 500 MG PO TABS: 500.0000 mg | ORAL_TABLET | Freq: Two times a day (BID) | ORAL | 0 refills | Status: DC

## 2018-12-07 NOTE — Progress Notes (Signed)
Subjective:    Patient ID: [name], female    DOB: 06-14-1960, 58 y.o.   MRN: [id number]  HPI Chief Complaint  Patient presents with  . vaginal irritation    pelvic pain, vaginal irritation for the last week and half, no odor   Here with complaints of a 10 day history of vaginal discharge, external vulvar discomfort with urination, dyspareunia and suprapubic pressure.  Denies fever, chills, night sweats, chest pain, palpitations, shortness of breath, back pain, N/V/D.   Recent unprotected sexual activity and states it was painful. States "it had been a while".   She recently switched soaps. Now is using feminine wipes and Dove soap.   She recalls having some vaginal bleeding in March or April 2020. This was not as heavy as a period but significant. Not after sex or trauma. This has not been worked up.    LMP: 06/2014 Last pap smear 01/2018 and negative  Linda Hedges at [facility] is her OB/GYN. She has not been there since 2017.   History of prediabetes with her last Hgb A1c 6.4% in November 2019.  Diet has been nondiscretionary. No exercise.  Recent urinary frequency without polyuria or polydipsia.   Reviewed allergies, medications, past medical, surgical, family, and social history.    Review of Systems Pertinent positives and negatives in the history of present illness.     Objective:   Physical Exam Exam conducted with a chaperone present.  Constitutional:      General: She is not in acute distress.    Appearance: Normal appearance. She is not ill-appearing.  Neck:     Musculoskeletal: Normal range of motion and neck supple.  Cardiovascular:     Rate and Rhythm: Normal rate and regular rhythm.     Pulses: Normal pulses.  Pulmonary:     Effort: Pulmonary effort is normal.     Breath sounds: Normal breath sounds.  Abdominal:     General: Abdomen is flat. Bowel sounds are normal. There is no distension.     Palpations: Abdomen is soft.    Tenderness: There is no abdominal tenderness. There is no right CVA tenderness, left CVA tenderness, guarding or rebound.  Genitourinary:    General: Normal vulva.     Labia:        Right: No rash, tenderness or lesion.        Left: No rash, tenderness or lesion.      Vagina: Vaginal discharge present.     Cervix: No discharge or cervical bleeding.     Uterus: Not tender.      Adnexa: Right adnexa normal and left adnexa normal.  Skin:    General: Skin is warm and dry.     Capillary Refill: Capillary refill takes less than 2 seconds.  Neurological:     Mental Status: She is alert and oriented to person, place, and time.    BP 120/82   Pulse 82   Temp 97.6 F (36.4 C)   Wt 242 lb 3.2 oz (109.9 kg)   LMP 06/23/2014   SpO2 98%   BMI 37.37 kg/m       Assessment & Plan:  Vaginal discharge - Plan: POCT Wet Prep [facility]) -treat for BV and yeast. She has a scant amount of yeast on wet prep.   At risk for sexually transmitted disease due to unprotected sex - Plan: RPR, HIV Antibody (routine testing w rflx), NuSwab Vaginitis Plus (VG+) - recommend condom use. Follow up  pending results.   Postmenopausal bleeding - Plan: CBC with Differential/Platelet, US Pelvic Complete With Transvaginal.  -discussed this needs to be worked up. Will send her for a pelvic US and refer to gyn as needed   Prediabetes - Plan: CBC with Differential/Platelet, Comprehensive metabolic panel, TSH, T4, free, Hemoglobin A1c. Check Hgb A1c and follow up.   BV (bacterial vaginosis) - Plan: metroNIDAZOLE (FLAGYL) 500 MG tablet.  -wet prep +BV  Urinary frequency - Plan: Hemoglobin A1c, POCT Urinalysis DIP (Proadvantage Device) -UA negative for infection or glucose. Trace protein  Dyspareunia in female - Plan: US Pelvic Complete With Transvaginal. Follow up pending results.   History of uterine fibroid - Plan: US Pelvic Complete With Transvaginal. Follow up pending results. Refer back to OB/GYN as  appropriate

## 2018-12-07 NOTE — Patient Instructions (Signed)
Avoid alcohol with the metronidazole.  We will contact you with your results

## 2018-12-08 LAB — CBC WITH DIFFERENTIAL/PLATELET
Basophils Absolute: 0 10*3/uL (ref 0.0–0.2)
Basos: 1 %
EOS (ABSOLUTE): 0.1 10*3/uL (ref 0.0–0.4)
Eos: 2 %
Hematocrit: 41.3 % (ref 34.0–46.6)
Hemoglobin: 13.3 g/dL (ref 11.1–15.9)
Immature Grans (Abs): 0 10*3/uL (ref 0.0–0.1)
Immature Granulocytes: 0 %
Lymphocytes Absolute: 2.6 10*3/uL (ref 0.7–3.1)
Lymphs: 40 %
MCH: 24.4 pg — ABNORMAL LOW (ref 26.6–33.0)
MCHC: 32.2 g/dL (ref 31.5–35.7)
MCV: 76 fL — ABNORMAL LOW (ref 79–97)
Monocytes Absolute: 0.4 10*3/uL (ref 0.1–0.9)
Monocytes: 7 %
Neutrophils Absolute: 3.3 10*3/uL (ref 1.4–7.0)
Neutrophils: 50 %
Platelets: 280 10*3/uL (ref 150–450)
RBC: 5.46 x10E6/uL — ABNORMAL HIGH (ref 3.77–5.28)
RDW: 13.8 % (ref 11.7–15.4)
WBC: 6.5 10*3/uL (ref 3.4–10.8)

## 2018-12-08 LAB — COMPREHENSIVE METABOLIC PANEL
ALT: 21 IU/L (ref 0–32)
AST: 23 IU/L (ref 0–40)
Albumin/Globulin Ratio: 1.8 (ref 1.2–2.2)
Albumin: 4.8 g/dL (ref 3.8–4.9)
Alkaline Phosphatase: 93 IU/L (ref 39–117)
BUN/Creatinine Ratio: 13 (ref 9–23)
BUN: 10 mg/dL (ref 6–24)
Bilirubin Total: 0.3 mg/dL (ref 0.0–1.2)
CO2: 20 mmol/L (ref 20–29)
Calcium: 9.7 mg/dL (ref 8.7–10.2)
Chloride: 101 mmol/L (ref 96–106)
Creatinine, Ser: 0.77 mg/dL (ref 0.57–1.00)
GFR calc Af Amer: 99 mL/min/{1.73_m2} (ref >59)
GFR calc non Af Amer: 86 mL/min/{1.73_m2} (ref >59)
Globulin, Total: 2.7 g/dL (ref 1.5–4.5)
Glucose: 165 mg/dL — ABNORMAL HIGH (ref 65–99)
Potassium: 4.2 mmol/L (ref 3.5–5.2)
Sodium: 140 mmol/L (ref 134–144)
Total Protein: 7.5 g/dL (ref 6.0–8.5)

## 2018-12-08 LAB — HIV ANTIBODY (ROUTINE TESTING W REFLEX): HIV Screen 4th Generation wRfx: NONREACTIVE

## 2018-12-08 LAB — HEMOGLOBIN A1C
Est. average glucose Bld gHb Est-mCnc: 148 mg/dL
Hgb A1c MFr Bld: 6.8 % — ABNORMAL HIGH (ref 4.8–5.6)

## 2018-12-08 LAB — RPR: RPR Ser Ql: NONREACTIVE

## 2018-12-08 LAB — T4, FREE: Free T4: 0.84 ng/dL (ref 0.82–1.77)

## 2018-12-08 LAB — TSH: TSH: 2.29 u[IU]/mL (ref 0.450–4.500)

## 2018-12-10 LAB — NUSWAB VAGINITIS PLUS (VG+)
Candida albicans, NAA: POSITIVE — AB
Candida glabrata, NAA: NEGATIVE
Chlamydia trachomatis, NAA: NEGATIVE
Neisseria gonorrhoeae, NAA: NEGATIVE
Trich vag by NAA: NEGATIVE

## 2018-12-13 ENCOUNTER — Ambulatory Visit
Admission: RE | Admit: 2018-12-13 | Discharge: 2018-12-13 | Disposition: A | Discharge status: Home / Self Care | Payer: Medicare HMO | Source: Ambulatory Visit | Attending: Family Medicine | Admitting: Family Medicine

## 2018-12-13 DIAGNOSIS — N950 Postmenopausal bleeding: Secondary | ICD-10-CM

## 2018-12-13 DIAGNOSIS — Z86018 Personal history of other benign neoplasm: Secondary | ICD-10-CM

## 2018-12-13 DIAGNOSIS — N941 Unspecified dyspareunia: Secondary | ICD-10-CM

## 2018-12-14 ENCOUNTER — Other Ambulatory Visit: Payer: Self-pay | Admitting: Internal Medicine

## 2018-12-14 DIAGNOSIS — N950 Postmenopausal bleeding: Secondary | ICD-10-CM

## 2018-12-14 DIAGNOSIS — R9389 Abnormal findings on diagnostic imaging of other specified body structures: Secondary | ICD-10-CM

## 2018-12-23 DIAGNOSIS — H524 Presbyopia: Secondary | ICD-10-CM | POA: Diagnosis not present

## 2019-01-03 ENCOUNTER — Encounter: Payer: Self-pay | Admitting: Family Medicine

## 2019-01-03 ENCOUNTER — Ambulatory Visit (INDEPENDENT_AMBULATORY_CARE_PROVIDER_SITE_OTHER): Payer: Medicare HMO | Admitting: Family Medicine

## 2019-01-03 ENCOUNTER — Other Ambulatory Visit: Payer: Self-pay

## 2019-01-03 VITALS — BP 122/70 | HR 70 | Temp 97.8°F | Wt 243.8 lb

## 2019-01-03 DIAGNOSIS — E7800 Pure hypercholesterolemia, unspecified: Secondary | ICD-10-CM

## 2019-01-03 DIAGNOSIS — E119 Type 2 diabetes mellitus without complications: Secondary | ICD-10-CM | POA: Diagnosis not present

## 2019-01-03 MED ORDER — ATORVASTATIN CALCIUM 10 MG PO TABS: 10.0000 mg | ORAL_TABLET | Freq: Every day | ORAL | 0 refills | Status: DC

## 2019-01-03 MED ORDER — ONETOUCH ULTRASOFT LANCETS MISC: 1 refills | Status: AC

## 2019-01-03 MED ORDER — BLOOD GLUCOSE TEST VI STRP: ORAL_STRIP | 1 refills | Status: DC

## 2019-01-03 NOTE — Progress Notes (Signed)
   Subjective:    Patient ID: [name], female    DOB: 1961-02-23, 58 y.o.   MRN: [id number]  HPI Chief Complaint  Patient presents with  . diabetes    diabetes new onset   She is here to follow up on new diabetes diagnosis. Previously she was prediabetic until Hgb A1c 6.8% on 12/07/2018.  Her diet is high in carbohydrates. Is not getting much exercise.  States she does not want to start on medication today. Would like to try diet and exercise.   Does nt want to see a nutritionist.  States her sister has diabetes. She is aware of this condition. Her daughter is a Marine scientist.   LDL- 108 and has never been on statin.   Denies fever, chills, dizziness, chest pain, palpitations, shortness of breath, abdominal pain, N/V/D, urinary symptoms, LE edema.   She occasionally has heel pain.  She also reports low back pain after standing at her sink doing dishes.   Reviewed allergies, medications, past medical, surgical, family, and social history.     Review of Systems Pertinent positives and negatives in the history of present illness.     Objective:   Physical Exam BP 122/70   Pulse 70   Temp 97.8 F (36.6 C)   Wt 243 lb 12.8 oz (110.6 kg)   LMP 06/23/2014   BMI 37.62 kg/m   Alert and oriented and in no acute distress. No LE edema.       Assessment & Plan:  Diabetes mellitus, new onset ([place]) - Plan: atorvastatin (LIPITOR) 10 MG tablet  Elevated LDL cholesterol level - Plan: atorvastatin (LIPITOR) 10 MG tablet  Hgb A1c 6.8% 2 weeks ago. Previously prediabetic.  Discussed in depth the diabetes spectrum. Counseling on healthy diet, lowering sweets and carbohydrates. Counseling on how and when to check her blood sugars. A meter was provided and she was taught correct technique. Discussed goal ranges.  Discussed medication options. She declines and would like to try diet and exercise.  Discussed starting on statin to lower cholesterol and she is willing.  Recommend  referral to MNT and she declines.  Discussed annual diabetic eye exams.  Advised of recommendation to get flu and pneumonia vaccines. She declines.  She is aware that she will need to start on ACE inhibitor or ARB at some point.  Recommend she read about standards of care for diabetes on ADA or AAFP.  Follow up in 6 weeks for lab visit for CMP and lipids.  Return in 3 months for diabetes check. Bring in readings.

## 2019-01-03 NOTE — Patient Instructions (Signed)
Your hemoglobin A1c is 6.8% and this is now considered diabetes.  I recommend that you cut back on sweets and carbohydrates, specifically potatoes, rice, pasta, bread.  Start reading nutrition labels and limit your snacks to 15 carbs. Try to have approximately 45 carbs for a meal.  Increase your physical activity.  Aim to get 150 minutes of vigorous physical activity per week.  Start checking your blood sugars. Goal fasting blood sugar is 80-130 2 hours after a meal goal blood sugar is 130-160  Keep a log of your blood sugar readings and bring these to your next appointment.  I am starting you on a cholesterol medication called atorvastatin.  This is once daily.  If you had any issues with muscle aches, you may add an over-the-counter supplement called co-Q10.  I recommend that you read about diabetes on the American diabetes Association website.   It is recommended that you get a diabetic eye exam every year.      Diabetes Mellitus and Nutrition, Adult When you have diabetes (diabetes mellitus), it is very important to have healthy eating habits because your blood sugar (glucose) levels are greatly affected by what you eat and drink. Eating healthy foods in the appropriate amounts, at about the same times every day, can help you:  Control your blood glucose.  Lower your risk of heart disease.  Improve your blood pressure.  Reach or maintain a healthy weight. Every person with diabetes is different, and each person has different needs for a meal plan. Your health care provider may recommend that you work with a diet and nutrition specialist (dietitian) to make a meal plan that is best for you. Your meal plan may vary depending on factors such as:  The calories you need.  The medicines you take.  Your weight.  Your blood glucose, blood pressure, and cholesterol levels.  Your activity level.  Other health conditions you have, such as heart or kidney disease. How do  carbohydrates affect me? Carbohydrates, also called carbs, affect your blood glucose level more than any other type of food. Eating carbs naturally raises the amount of glucose in your blood. Carb counting is a method for keeping track of how many carbs you eat. Counting carbs is important to keep your blood glucose at a healthy level, especially if you use insulin or take certain oral diabetes medicines. It is important to know how many carbs you can safely have in each meal. This is different for every person. Your dietitian can help you calculate how many carbs you should have at each meal and for each snack. Foods that contain carbs include:  Bread, cereal, rice, pasta, and crackers.  Potatoes and corn.  Peas, beans, and lentils.  Milk and yogurt.  Fruit and juice.  Desserts, such as cakes, cookies, ice cream, and candy. How does alcohol affect me? Alcohol can cause a sudden decrease in blood glucose (hypoglycemia), especially if you use insulin or take certain oral diabetes medicines. Hypoglycemia can be a life-threatening condition. Symptoms of hypoglycemia (sleepiness, dizziness, and confusion) are similar to symptoms of having too much alcohol. If your health care provider says that alcohol is safe for you, follow these guidelines:  Limit alcohol intake to no more than 1 drink per day for nonpregnant women and 2 drinks per day for men. One drink equals 12 oz of beer, 5 oz of wine, or 1 oz of hard liquor.  Do not drink on an empty stomach.  Keep yourself hydrated with  water, diet soda, or unsweetened iced tea.  Keep in mind that regular soda, juice, and other mixers may contain a lot of sugar and must be counted as carbs. What are tips for following this plan?  Reading food labels  Start by checking the serving size on the "Nutrition Facts" label of packaged foods and drinks. The amount of calories, carbs, fats, and other nutrients listed on the label is based on one serving of  the item. Many items contain more than one serving per package.  Check the total grams (g) of carbs in one serving. You can calculate the number of servings of carbs in one serving by dividing the total carbs by 15. For example, if a food has 30 g of total carbs, it would be equal to 2 servings of carbs.  Check the number of grams (g) of saturated and trans fats in one serving. Choose foods that have low or no amount of these fats.  Check the number of milligrams (mg) of salt (sodium) in one serving. Most people should limit total sodium intake to less than 2,300 mg per day.  Always check the nutrition information of foods labeled as "low-fat" or "nonfat". These foods may be higher in added sugar or refined carbs and should be avoided.  Talk to your dietitian to identify your daily goals for nutrients listed on the label. Shopping  Avoid buying canned, premade, or processed foods. These foods tend to be high in fat, sodium, and added sugar.  Shop around the outside edge of the grocery store. This includes fresh fruits and vegetables, bulk grains, fresh meats, and fresh dairy. Cooking  Use low-heat cooking methods, such as baking, instead of high-heat cooking methods like deep frying.  Cook using healthy oils, such as olive, canola, or sunflower oil.  Avoid cooking with butter, cream, or high-fat meats. Meal planning  Eat meals and snacks regularly, preferably at the same times every day. Avoid going long periods of time without eating.  Eat foods high in fiber, such as fresh fruits, vegetables, beans, and whole grains. Talk to your dietitian about how many servings of carbs you can eat at each meal.  Eat 4-6 ounces (oz) of lean protein each day, such as lean meat, chicken, fish, eggs, or tofu. One oz of lean protein is equal to: ? 1 oz of meat, chicken, or fish. ? 1 egg. ?  cup of tofu.  Eat some foods each day that contain healthy fats, such as avocado, nuts, seeds, and  fish. Lifestyle  Check your blood glucose regularly.  Exercise regularly as told by your health care provider. This may include: ? 150 minutes of moderate-intensity or vigorous-intensity exercise each week. This could be brisk walking, biking, or water aerobics. ? Stretching and doing strength exercises, such as yoga or weightlifting, at least 2 times a week.  Take medicines as told by your health care provider.  Do not use any products that contain nicotine or tobacco, such as cigarettes and e-cigarettes. If you need help quitting, ask your health care provider.  Work with a Social worker or diabetes educator to identify strategies to manage stress and any emotional and social challenges. Questions to ask a health care provider  Do I need to meet with a diabetes educator?  Do I need to meet with a dietitian?  What number can I call if I have questions?  When are the best times to check my blood glucose? Where to find more information:  American Diabetes  Association: diabetes.org  Academy of Nutrition and Dietetics: www.eatright.[employer] of Diabetes and Digestive and Kidney Diseases (NIH): [employer] Summary  A healthy meal plan will help you control your blood glucose and maintain a healthy lifestyle.  Working with a diet and nutrition specialist (dietitian) can help you make a meal plan that is best for you.  Keep in mind that carbohydrates (carbs) and alcohol have immediate effects on your blood glucose levels. It is important to count carbs and to use alcohol carefully. This information is not intended to replace advice given to you by your health care provider. Make sure you discuss any questions you have with your health care provider. Document Released: 12/05/2004 Document Revised: 02/20/2017 Document Reviewed: 04/14/2016 Elsevier Patient Education  2020 [employer].

## 2019-01-18 ENCOUNTER — Telehealth: Payer: Self-pay | Admitting: Family Medicine

## 2019-01-18 DIAGNOSIS — R110 Nausea: Secondary | ICD-10-CM

## 2019-01-18 MED ORDER — ONDANSETRON 4 MG PO TBDP: 4.0000 mg | ORAL_TABLET | Freq: Three times a day (TID) | ORAL | 0 refills | Status: DC | PRN

## 2019-01-18 NOTE — Telephone Encounter (Signed)
Pt left message that she needs refill on Zofran for nausea

## 2019-01-18 NOTE — Telephone Encounter (Signed)
It looks like Dr. Redmond School refilled this today. If not, ok to refill.

## 2019-01-19 NOTE — Telephone Encounter (Signed)
This was already done

## 2019-01-27 ENCOUNTER — Ambulatory Visit (INDEPENDENT_AMBULATORY_CARE_PROVIDER_SITE_OTHER): Payer: Medicare HMO | Admitting: Obstetrics & Gynecology

## 2019-01-27 ENCOUNTER — Other Ambulatory Visit: Payer: Self-pay

## 2019-01-27 ENCOUNTER — Encounter: Payer: Self-pay | Admitting: Obstetrics & Gynecology

## 2019-01-27 VITALS — BP 136/87 | HR 122 | Ht 67.0 in | Wt 240.9 lb

## 2019-01-27 DIAGNOSIS — N882 Stricture and stenosis of cervix uteri: Secondary | ICD-10-CM | POA: Diagnosis not present

## 2019-01-27 DIAGNOSIS — N950 Postmenopausal bleeding: Secondary | ICD-10-CM

## 2019-01-27 NOTE — Progress Notes (Signed)
GYNECOLOGY OFFICE VISIT NOTE  History:   [name] is a 58 y.o. [phone] here today for evaluation of postmenopausal bleeding. Had one episode in March or April, no further bleeding since then.  Pelvic ultrasound showed 4 mm endometrial stripe.  She denies any abnormal vaginal discharge, bleeding, pelvic pain or other concerns.    Past Medical History:  Diagnosis Date   Abdominal pain    due to fibroids   Anxiety    Arthritis    Asthma    stress induced asthma   Chlamydia    Depression    Fibroid    uterine   GERD (gastroesophageal reflux disease)    Hyperlipidemia    IBS (irritable bowel syndrome)    Obesity    Trichomonas    Tubular adenoma of colon 01/2012    Past Surgical History:  Procedure Laterality Date   colonscopy  2017   COLPOSCOPY  1990   results were normal   IR GENERIC HISTORICAL  03/21/2016   IR EMBO TUMOR ORGAN ISCHEMIA INFARCT INC GUIDE ROADMAPPING 03/21/2016 Arne Cleveland, MD [facility]   IR GENERIC HISTORICAL  03/21/2016   IR US GUIDE VASC ACCESS RIGHT 03/21/2016 Arne Cleveland, MD [facility]   IR GENERIC HISTORICAL  03/21/2016   IR ANGIOGRAM SELECTIVE EACH ADDITIONAL VESSEL 03/21/2016 Arne Cleveland, MD [facility]   IR GENERIC HISTORICAL  03/21/2016   IR ANGIOGRAM SELECTIVE EACH ADDITIONAL VESSEL 03/21/2016 Arne Cleveland, MD [facility]   IR GENERIC HISTORICAL  03/21/2016   IR ANGIOGRAM PELVIS SELECTIVE OR SUPRASELECTIVE 03/21/2016 Arne Cleveland, MD [facility]   IR GENERIC HISTORICAL  03/21/2016   IR ANGIOGRAM PELVIS SELECTIVE OR SUPRASELECTIVE 03/21/2016 Arne Cleveland, MD [facility]   IR GENERIC HISTORICAL  02/27/2016   IR RADIOLOGIST EVAL & MGMT 02/27/2016 Arne Cleveland, MD [facility]   IR GENERIC HISTORICAL  04/03/2016   IR RADIOLOGIST EVAL & MGMT 04/03/2016 Arne Cleveland, MD [facility]   IR RADIOLOGIST EVAL & MGMT  08/26/2016   labial cyst removal     TUBAL LIGATION     20  years ago    The following portions of the patient's history were reviewed and updated as appropriate: allergies, current medications, past family history, past medical history, past social history, past surgical history and problem list.   Health Maintenance:  Normal pap and negative HRHPV on 02/17/2018.  Normal mammogram on 03/22/2018.   Review of Systems:  Pertinent items noted in HPI and remainder of comprehensive ROS otherwise negative.  Physical Exam:  BP 136/87    Pulse (!) 122    Ht 5\' 7"  (1.702 m)    Wt 240 lb 14.4 oz (109.3 kg)    LMP 06/23/2014    BMI 37.73 kg/m  CONSTITUTIONAL: Well-developed, well-nourished female in no acute distress.  HEENT:  Normocephalic, atraumatic. External right and left ear normal. No scleral icterus.  NECK: Normal range of motion, supple, no masses noted on observation SKIN: No rash noted. Not diaphoretic. No erythema. No pallor. MUSCULOSKELETAL: Normal range of motion. No edema noted. NEUROLOGIC: Alert and oriented to person, place, and time. Normal muscle tone coordination. No cranial nerve deficit noted. PSYCHIATRIC: Normal mood and affect. Normal behavior. Normal judgment and thought content. CARDIOVASCULAR: Normal heart rate noted RESPIRATORY: Effort and breath sounds normal, no problems with respiration noted ABDOMEN: No masses noted. No other overt distention noted.   PELVIC: External genitalia with moderate atrophy; atrophic vaginal mucosa and cervix.  No abnormal discharge  noted.  Normal uterine size, no other palpable masses, no uterine or adnexal tenderness.  ENDOMETRIAL BIOPSY  - Failed attempt The indications for endometrial biopsy were reviewed.   Risks of the biopsy including cramping, bleeding, infection, uterine perforation, inadequate specimen and need for additional procedures  were discussed. The patient states she understands and agrees to undergo procedure today. Consent was signed. Time out was performed.  During the pelvic  exam, the cervix was prepped with Betadine. A single-toothed tenaculum was placed on the anterior lip of the cervix to stabilize it. Attempted to pass the 3 mm pipelle through the cervix, stenosis was encountered.  Tried using dilators, but they were not successful.  Procedure stopped. The patient tolerated the procedure attempt well.   Labs and Imaging US Pelvic Complete With Transvaginal  Result Date: 12/13/2018 CLINICAL DATA:  Postmenopausal bleeding, single episode in April; past history of uterine fibroids and fibroid ablation EXAM: TRANSABDOMINAL AND TRANSVAGINAL ULTRASOUND OF PELVIS TECHNIQUE: Both transabdominal and transvaginal ultrasound examinations of the pelvis were performed. Transabdominal technique was performed for global imaging of the pelvis including uterus, ovaries, adnexal regions, and pelvic cul-de-sac. It was necessary to proceed with endovaginal exam following the transabdominal exam to visualize the endometrium and ovaries. COMPARISON:  12/09/2012 FINDINGS: Uterus Measurements: 7.8 x 3.6 x 5.4 cm = volume: 81 mL. Anteverted. Heterogeneous myometrial echogenicity. Mass at fundus 3.0 x 3.5 x 2.9 cm in size containing hyperechoic foci likely calcifications. This mass likely extends submucosal. No additional masses. Endometrium Thickness: 4 mm.  No endometrial fluid or focal abnormality Right ovary Measurements: 2.9 x 1.4 x 1.2 cm = volume: 2.6 mL. Dominant follicle 11 mm greatest size without mass Left ovary Not visualized on either transabdominal or endovaginal imaging, likely obscured by bowel Other findings At free pelvic fluid or adnexal masses. IMPRESSION: Probable submucosal leiomyoma at uterine fundus containing calcifications. Nonvisualization of LEFT ovary. Normal appearing endometrial complex 4 mm thick. In the setting of post-menopausal bleeding, this is consistent with a benign etiology such as endometrial atrophy. If bleeding remains unresponsive to hormonal or medical  therapy, sonohysterogram should be considered for focal lesion work-up. (Ref: Radiological Reasoning: Algorithmic Workup of Abnormal Vaginal Bleeding with Endovaginal Sonography and Sonohysterography. [id number]) Electronically Signed   By: Lavonia Dana M.D.   On: 12/13/2018 18:05       Assessment and Plan:    1. Postmenopausal bleeding 2. Cervical stenosis Discussed etiologies of postmenopausal bleeding, concern about precancerous/hyperplasia or cancerous etiology (5 to 10% percent of cases).   However, she was reassured that endometrial atrophy (most likely in this case given her 87mm stripe) and endometrial polyps are the most common causes of postmenopausal bleeding. She also has a calcified submucosal fibroid, no intervention needed for this for now. Uterine bleeding in postmenopausal women is usually light and self-limited. Exclusion of cancer is the main objective; therefore, treatment is usually unnecessary once cancer has been excluded.  The primary goal in the diagnostic evaluation of postmenopausal women with uterine bleeding is to exclude malignancy; this can include endometrial biopsy.   Unfortunately, could not do it today due to stenosis. Will observe for now as reassured by thin EM, proceed with Hysteroscopy, D&C for recurrent or persistent bleeding. Bleeding precautions reviewed.  Please refer to After Visit Summary for other counseling recommendations.   Return for any gynecologic concerns.    Total face-to-face time with patient: 20 minutes.  Over 50% of encounter was spent on counseling and coordination of care.  Verita Schneiders, MD, [place] for [employer], [place]

## 2019-01-27 NOTE — Patient Instructions (Addendum)
  Postmenopausal Bleeding  Postmenopausal bleeding is any bleeding that a woman has after she has entered into menopause. Menopause is the end of a woman's fertile years. After menopause, a woman no longer ovulates and does not have menstrual periods. Postmenopausal bleeding may have various causes, including:  Menopausal hormone therapy (MHT).  Endometrial atrophy. After menopause, low estrogen hormone levels cause the membrane that lines the uterus (endometrium) to become thinner. You may have bleeding as the endometrium thins.  Endometrial hyperplasia. This condition is caused by excess estrogen hormones and low levels of progesterone hormones. The excess estrogen causes the endometrium to thicken, which can lead to bleeding. In some cases, this can lead to cancer of the uterus.  Endometrial cancer.  Non-cancerous growths (polyps) on the endometrium, the lining of the uterus, or the cervix.  Uterine fibroids. These are non-cancerous growths in or around the uterus muscle tissue that can cause heavy bleeding. Any type of postmenopausal bleeding, even if it appears to be a typical menstrual period, should be evaluated by your health care provider. Treatment will depend on the cause of the bleeding. Follow these instructions at home:  Pay attention to any changes in your symptoms.  Avoid using tampons and douches as told by your health care provider.  Change your pads regularly.  Get regular pelvic exams and Pap tests.  Take iron supplements as told by your health care provider.  Take over-the-counter and prescription medicines only as told by your health care provider.  Keep all follow-up visits as told by your health care provider. This is important. Contact a health care provider if:  Your bleeding lasts more than 1 week.  You have abdominal pain.  You have bleeding with or after sexual intercourse.  You have bleeding that happens more often than every 3 weeks. Get help  right away if:  You have a fever, chills, headache, dizziness, muscle aches, and bleeding.  You have severe pain with bleeding.  You are passing blood clots.  You have heavy bleeding, need more than 1 pad an hour, and have never experienced this before.  You feel faint. Summary  Postmenopausal bleeding is any bleeding that a woman has after she has entered into menopause.  Postmenopausal bleeding may have various causes. Treatment will depend on the cause of the bleeding.  Any type of postmenopausal bleeding, even if it appears to be a typical menstrual period, should be evaluated by your health care provider.  Be sure to pay attention to any changes in your symptoms and keep all follow-up visits as told by your health care provider. This information is not intended to replace advice given to you by your health care provider. Make sure you discuss any questions you have with your health care provider. Document Released: 06/18/2005 Document Revised: 06/17/2017 Document Reviewed: 06/03/2016 Elsevier Patient Education  2020 Elsevier Inc.  

## 2019-02-03 ENCOUNTER — Ambulatory Visit: Payer: Medicare HMO | Admitting: Family Medicine

## 2019-02-08 DIAGNOSIS — R69 Illness, unspecified: Secondary | ICD-10-CM | POA: Diagnosis not present

## 2019-02-08 DIAGNOSIS — F4312 Post-traumatic stress disorder, chronic: Secondary | ICD-10-CM | POA: Diagnosis not present

## 2019-02-08 DIAGNOSIS — F330 Major depressive disorder, recurrent, mild: Secondary | ICD-10-CM | POA: Diagnosis not present

## 2019-02-15 ENCOUNTER — Other Ambulatory Visit: Payer: Self-pay

## 2019-02-15 ENCOUNTER — Other Ambulatory Visit: Payer: Medicare HMO

## 2019-02-15 DIAGNOSIS — E119 Type 2 diabetes mellitus without complications: Secondary | ICD-10-CM

## 2019-02-15 DIAGNOSIS — E7800 Pure hypercholesterolemia, unspecified: Secondary | ICD-10-CM | POA: Diagnosis not present

## 2019-02-15 LAB — COMPREHENSIVE METABOLIC PANEL
ALT: 16 IU/L (ref 0–32)
AST: 17 IU/L (ref 0–40)
Albumin/Globulin Ratio: 1.5 (ref 1.2–2.2)
Albumin: 4.4 g/dL (ref 3.8–4.9)
Alkaline Phosphatase: 96 IU/L (ref 39–117)
BUN/Creatinine Ratio: 14 (ref 9–23)
BUN: 11 mg/dL (ref 6–24)
Bilirubin Total: 0.3 mg/dL (ref 0.0–1.2)
CO2: 22 mmol/L (ref 20–29)
Calcium: 9.6 mg/dL (ref 8.7–10.2)
Chloride: 102 mmol/L (ref 96–106)
Creatinine, Ser: 0.78 mg/dL (ref 0.57–1.00)
GFR calc Af Amer: 97 mL/min/{1.73_m2} (ref >59)
GFR calc non Af Amer: 84 mL/min/{1.73_m2} (ref >59)
Globulin, Total: 2.9 g/dL (ref 1.5–4.5)
Glucose: 127 mg/dL — ABNORMAL HIGH (ref 65–99)
Potassium: 4.2 mmol/L (ref 3.5–5.2)
Sodium: 142 mmol/L (ref 134–144)
Total Protein: 7.3 g/dL (ref 6.0–8.5)

## 2019-02-15 LAB — LIPID PANEL
Chol/HDL Ratio: 2.9 ratio (ref 0.0–4.4)
Cholesterol, Total: 183 mg/dL (ref 100–199)
HDL: 63 mg/dL (ref >39)
LDL Chol Calc (NIH): 98 mg/dL (ref 0–99)
Triglycerides: 123 mg/dL (ref 0–149)
VLDL Cholesterol Cal: 22 mg/dL (ref 5–40)

## 2019-02-22 ENCOUNTER — Encounter: Payer: Self-pay | Admitting: *Deleted

## 2019-03-16 ENCOUNTER — Encounter: Payer: Self-pay | Admitting: Family Medicine

## 2019-03-16 ENCOUNTER — Other Ambulatory Visit: Payer: Self-pay

## 2019-03-16 ENCOUNTER — Ambulatory Visit (INDEPENDENT_AMBULATORY_CARE_PROVIDER_SITE_OTHER): Payer: Medicare HMO | Admitting: Family Medicine

## 2019-03-16 VITALS — BP 140/88 | HR 109 | Temp 98.2°F | Wt 249.8 lb

## 2019-03-16 DIAGNOSIS — R300 Dysuria: Secondary | ICD-10-CM

## 2019-03-16 DIAGNOSIS — M545 Low back pain, unspecified: Secondary | ICD-10-CM

## 2019-03-16 DIAGNOSIS — R102 Pelvic and perineal pain: Secondary | ICD-10-CM | POA: Diagnosis not present

## 2019-03-16 LAB — POCT URINALYSIS DIP (PROADVANTAGE DEVICE)
Bilirubin, UA: NEGATIVE
Blood, UA: NEGATIVE
Glucose, UA: NEGATIVE mg/dL
Ketones, POC UA: NEGATIVE mg/dL
Leukocytes, UA: NEGATIVE
Nitrite, UA: NEGATIVE
Specific Gravity, Urine: 1.01
Urobilinogen, Ur: NEGATIVE
pH, UA: 6 (ref 5.0–8.0)

## 2019-03-16 MED ORDER — SULFAMETHOXAZOLE-TRIMETHOPRIM 800-160 MG PO TABS: 1.0000 | ORAL_TABLET | Freq: Two times a day (BID) | ORAL | 0 refills | Status: DC

## 2019-03-16 NOTE — Progress Notes (Signed)
Subjective:  [name] is a 58 y.o. female who complains of possible urinary tract infection.  She has had symptoms for 2 week.  Symptoms include dysuria, suprapubic pressure and low back ache.  she has also had some nausea but no vomiting. Normal bowel movements.  Patient denies fever and chills, vaginal discharge.  Last UTI was months ago.   Using AZO and cranberry juice for current symptoms. States the pain was worse yesterday in her low back so she took ibuprofen 800 mg and used a heating pad.    Patient does not have a history of recurrent UTI. Patient does not have a history of pyelonephritis.  No other aggravating or relieving factors.  No other c/o.  No recent sex with other partners. Reports recent use of a vibrator and hopes she did not case these symptoms by using it.   Past Medical History:  Diagnosis Date  . Abdominal pain    due to fibroids  . Anxiety   . Arthritis   . Asthma    stress induced asthma  . Chlamydia   . Depression   . Fibroid    uterine  . GERD (gastroesophageal reflux disease)   . Hyperlipidemia   . IBS (irritable bowel syndrome)   . Obesity   . Trichomonas   . Tubular adenoma of colon 01/2012    ROS as in subjective  Reviewed allergies, medications, past medical, surgical, and social history.    Objective: Vitals:   03/16/19 1004  BP: 140/88  Pulse: (!) 109  Temp: 98.2 F (36.8 C)    General appearance: alert, no distress, WD/WN, female Abdomen: +bs, soft, slight TTP in suprapubic area, otherwise non tender, non distended, no masses, no hepatomegaly, no splenomegaly, no bruits Back: no CVA tenderness GU: not done      Laboratory:  Urine dipstick: trace for protein.       Assessment: Pelvic pressure in female - Plan: POCT Urinalysis DIP (Proadvantage Device), Urine culture  Dysuria - Plan: Urine culture, sulfamethoxazole-trimethoprim (BACTRIM DS) 800-160 MG tablet  Acute bilateral low back pain without  sciatica    Plan: Discussed symptoms, diagnosis, possible complications, and usual course of illness.  Bactrim prescribed.  Advised increased water intake, can use OTC Tylenol for pain.    Advised she may take ibuprofen and AZO as needed for pain. Heating pad as well. Discussed using good lubricant with sex toys/activity.   Urine culture sent.   Call or return if worse or not improving.

## 2019-03-16 NOTE — Patient Instructions (Signed)
Take the antibiotic as prescribed.  Continue staying well-hydrated.  You may take AZO for another 24 to 48 hours but then stop so that we can see if the antibiotic is working.  You may take ibuprofen 800 mg 3 times per day as needed for the next couple of days.  Using a heating pad is also a good idea.  We will let you know what your urine culture shows.  If you are not back to baseline after completing the antibiotic or if your symptoms worsen, let us know or you may need to be evaluated at an urgent care if our office is not open.

## 2019-03-17 LAB — URINE CULTURE: Organism ID, Bacteria: NO GROWTH

## 2019-03-25 DIAGNOSIS — R809 Proteinuria, unspecified: Secondary | ICD-10-CM

## 2019-03-25 HISTORY — DX: Proteinuria, unspecified: R80.9

## 2019-03-28 ENCOUNTER — Ambulatory Visit (INDEPENDENT_AMBULATORY_CARE_PROVIDER_SITE_OTHER): Payer: Medicare HMO | Admitting: Family Medicine

## 2019-03-28 ENCOUNTER — Encounter: Payer: Self-pay | Admitting: Family Medicine

## 2019-03-28 ENCOUNTER — Other Ambulatory Visit: Payer: Self-pay

## 2019-03-28 VITALS — BP 130/80 | HR 112 | Temp 97.3°F | Wt 248.6 lb

## 2019-03-28 DIAGNOSIS — N950 Postmenopausal bleeding: Secondary | ICD-10-CM | POA: Diagnosis not present

## 2019-03-28 DIAGNOSIS — M545 Low back pain, unspecified: Secondary | ICD-10-CM

## 2019-03-28 DIAGNOSIS — R102 Pelvic and perineal pain: Secondary | ICD-10-CM | POA: Diagnosis not present

## 2019-03-28 DIAGNOSIS — R801 Persistent proteinuria, unspecified: Secondary | ICD-10-CM

## 2019-03-28 LAB — POCT URINALYSIS DIP (PROADVANTAGE DEVICE)
Bilirubin, UA: NEGATIVE
Blood, UA: NEGATIVE
Glucose, UA: NEGATIVE mg/dL
Ketones, POC UA: NEGATIVE mg/dL
Leukocytes, UA: NEGATIVE
Nitrite, UA: NEGATIVE
Protein Ur, POC: 30 mg/dL — AB
Specific Gravity, Urine: 1.02
Urobilinogen, Ur: NEGATIVE
pH, UA: 6 (ref 5.0–8.0)

## 2019-03-28 NOTE — Patient Instructions (Signed)
Proteinuria Proteinuria is when there is too much protein in the urine. Proteins are important for building muscles and bones. Proteins are also needed to fight infections, help the blood to clot, and keep body fluids in balance. Proteinuria may be mild and temporary, or it may be an early sign of kidney disease. The kidneys make urine. Healthy kidneys also keep substances like proteins from leaving the blood and ending up in the urine. What are the causes? This condition may be caused by damage to the kidneys or by temporary causes such as fever or stress. Proteinuria may happen when the kidneys are not working well. Healthy kidneys have filters (glomeruli) that keep proteins out of the urine. Proteinuria may mean that the glomeruli are damaged. The main causes of this type of damage are:  Diabetes.  High blood pressure. Other causes of kidney damage can also cause proteinuria, such as:  Diseases of the immune system, such as lupus, rheumatoid arthritis, sarcoidosis, and Goodpasture syndrome.  Heart disease or heart failure.  Kidney infection.  Certain cancers, including kidney cancer, lymphoma, leukemia, and multiple myeloma.  Amyloidosis. This is a disease that causes abnormal proteins to build up in body tissues.  Reactions to certain medicines, such as NSAIDs.  Injuries or poisons (toxins).  High blood pressure that occurs during pregnancy (preeclampsia and eclampsia). Temporary proteinuria may result from conditions that put stress on the kidneys. These conditions usually do not cause kidney damage. They include:  Fever.  Exposure to cold or heat.  Emotional or physical stress.  Extreme exercise.  Standing for long periods of time. What increases the risk? You are more likely to develop this condition if you:  Have diabetes.  Have high blood pressure.  Have heart disease or heart failure.  Have an immune disease, cancer, or other disease that affects the  kidneys.  Have a family history of kidney disease.  Are 10 years of age or older.  Are overweight.  Are of African American, American [place], Hispanic/Latino, or [place] descent.  Are pregnant.  Have an infection. What are the signs or symptoms? Mild proteinuria may not cause symptoms. As more proteins enter the urine, symptoms of kidney disease may develop, such as:  Foamy urine.  Swelling of the face, abdomen, hands, legs, or feet (edema).  Needing to urinate frequently.  Fatigue.  Difficulty sleeping.  Dry and itchy skin.  Nausea and vomiting.  Muscle cramps.  Shortness of breath. How is this diagnosed? This condition may be diagnosed with a urine test. You may have this test as part of a routine physical exam or because you have symptoms of kidney disease or risk factors for kidney disease. You may also have:  Blood tests to measure the level of a certain substance (creatinine) that increases with kidney disease.  Imaging tests of your kidney, such as a CT scan or an ultrasound, to look for signs of kidney damage. How is this treated? If your proteinuria is mild or temporary, treatment may not be needed for this condition. Your health care provider may show you how to monitor the level of protein in your urine at home. Identifying proteinuria early is important so that the cause of the condition can be treated. Treatment for this condition depends on the cause of your proteinuria. Treatment may include:  Making diet and lifestyle changes.  Getting blood pressure under control.  Getting blood sugar under control, if you have diabetes.  Managing any other medical conditions you have that  affect your kidneys.  Giving birth, if you are pregnant.  Avoiding medicines that damage your kidneys. In severe cases, kidney disease may need to be treated with medicines or dialysis. Follow these instructions at home: Activity  Return to your normal activities  as told by your health care provider. Ask your health care provider what activities are safe for you.  Ask your health care provider to recommend an exercise program. General instructions  Check your protein levels at home if directed by your health care provider.  Follow instructions from your health care provider about eating or drinking restrictions.  If you are overweight, ask your health care provider about diets that can help you get to a healthy weight.  Take over-the-counter and prescription medicines only as told by your health care provider.  Keep all follow-up visits as told by your health care provider. This is important. Contact a health care provider if:  You have new symptoms.  Your symptoms get worse or do not improve. Get help right away if you:  Have back pain.  Have diarrhea.  Vomit.  Have a fever.  Have a rash. Summary  Proteinuria is when there is too much protein in the urine.  Proteinuria may be mild and temporary, or it may be an early sign of kidney disease.  This condition may be diagnosed with a urine test.  Treatment for this condition depends on the cause of your proteinuria.  Treatment may include diet and lifestyle changes, blood pressure and blood sugar management, and avoiding medicines that may damage the kidneys. If the proteinuria is severe, it may need to be treated with medicines or dialysis. This information is not intended to replace advice given to you by your health care provider. Make sure you discuss any questions you have with your health care provider. Document Revised: 10/26/2017 Document Reviewed: 10/26/2017 Elsevier Patient Education  Center.

## 2019-03-28 NOTE — Progress Notes (Signed)
   Subjective:    Patient ID: [name], female    DOB: 06/19/1960, 59 y.o.   MRN: [id number]  HPI Chief Complaint  Patient presents with  . pelvic pressure    got better wiht antibitoics but started, menstrual cramping and spotting, and back pain.    Here to follow up on pelvic pain and dysuria. Treated with Bactrim until culture returned as negative.   States she is still having pelvic pressure and urinary urgency. She is also having some light vaginal bleeding. She saw her gynecologist for post menopausal bleeding in November and is supposed to follow up for a procedure to rule out endometrial cancer.   States she was having abdominal cramping that radiated around her flank bilaterally and this resolved after passing gas and her bowel movements became more regular.   Intermittent mid-line low back pain that does not radiate. Pain is worse with certain movements.   She has also noticed fatigue for several days.  Denies history of renal disease but she has had protein in her urine on several occasions. This has not been worked up in the past per patient.   She has a history of fast heart rate, states this is chronic and does not bother her. States she does not feel like her heart is beating fast. No pain or dyspnea.   Denies fever, chills, dizziness, chest pain, palpitations, shortness of breath,  LE edema.   Reviewed allergies, medications, past medical, surgical, family, and social history.   Review of Systems Pertinent positives and negatives in the history of present illness.     Objective:   Physical Exam BP 130/80   Pulse (!) 112   Temp (!) 97.3 F (36.3 C)   Wt 248 lb 9.6 oz (112.8 kg)   LMP 06/23/2014   BMI 38.94 kg/m   Alert and oriented and in no distress. Cardiac exam shows a regular rhythm, tachycardia without murmurs or gallops. Lungs are clear to auscultation. Abdomen is soft, non distended, normal BS, non tender, no palpable masses. Extremities  without edema. Skin is warm and dry.       Assessment & Plan:  Post-menopausal bleeding  Pelvic pressure in female - Plan: POCT Urinalysis DIP (Proadvantage Device)  Acute bilateral low back pain without sciatica - Plan: POCT Urinalysis DIP (Proadvantage Device)  Persistent proteinuria - Plan: Protein, urine, 24 hour  No acute distress. Reviewed labs from 02/15/2019. Repeat UA shows persistent protein. This has not been worked up in the past. We will check a 24 hour urine and follow up.  Back pain appears MSK related.  Strongly encouraged her to follow up with her gynecologist for post menopausal bleeding with history of fibroids and stenosis of cervical os. Gabriel Cirri, my CMA, will schedule her for the next available appointment with her OB/GYN.  Follow up with me as scheduled for diabetes and other chronic health conditions.

## 2019-03-30 DIAGNOSIS — R801 Persistent proteinuria, unspecified: Secondary | ICD-10-CM | POA: Diagnosis not present

## 2019-03-31 ENCOUNTER — Other Ambulatory Visit: Payer: Self-pay | Admitting: Internal Medicine

## 2019-03-31 DIAGNOSIS — R801 Persistent proteinuria, unspecified: Secondary | ICD-10-CM

## 2019-04-01 ENCOUNTER — Encounter: Payer: Self-pay | Admitting: Family Medicine

## 2019-04-01 LAB — PROTEIN, URINE, 24 HOUR
Protein, 24H Urine: 310 mg/24 hr — ABNORMAL HIGH (ref 30–150)
Protein, Ur: 28.2 mg/dL

## 2019-04-01 NOTE — Progress Notes (Signed)
Please put in referral to nephrologist due to persistent proteinuria.

## 2019-04-04 ENCOUNTER — Other Ambulatory Visit: Payer: Self-pay | Admitting: Internal Medicine

## 2019-04-04 DIAGNOSIS — R801 Persistent proteinuria, unspecified: Secondary | ICD-10-CM

## 2019-04-05 ENCOUNTER — Encounter: Payer: Medicare HMO | Admitting: Family Medicine

## 2019-04-06 ENCOUNTER — Other Ambulatory Visit: Payer: Self-pay | Admitting: Family Medicine

## 2019-04-06 ENCOUNTER — Other Ambulatory Visit: Payer: Self-pay

## 2019-04-06 ENCOUNTER — Encounter: Payer: Medicare HMO | Admitting: Family Medicine

## 2019-04-06 ENCOUNTER — Encounter: Payer: Self-pay | Admitting: Family Medicine

## 2019-04-06 ENCOUNTER — Ambulatory Visit (INDEPENDENT_AMBULATORY_CARE_PROVIDER_SITE_OTHER): Payer: Medicare HMO | Admitting: Family Medicine

## 2019-04-06 VITALS — BP 124/84 | HR 95 | Temp 98.4°F | Wt 247.0 lb

## 2019-04-06 DIAGNOSIS — E119 Type 2 diabetes mellitus without complications: Secondary | ICD-10-CM

## 2019-04-06 DIAGNOSIS — E118 Type 2 diabetes mellitus with unspecified complications: Secondary | ICD-10-CM | POA: Diagnosis not present

## 2019-04-06 DIAGNOSIS — E7800 Pure hypercholesterolemia, unspecified: Secondary | ICD-10-CM

## 2019-04-06 DIAGNOSIS — J4520 Mild intermittent asthma, uncomplicated: Secondary | ICD-10-CM | POA: Diagnosis not present

## 2019-04-06 DIAGNOSIS — R21 Rash and other nonspecific skin eruption: Secondary | ICD-10-CM | POA: Diagnosis not present

## 2019-04-06 DIAGNOSIS — K219 Gastro-esophageal reflux disease without esophagitis: Secondary | ICD-10-CM | POA: Diagnosis not present

## 2019-04-06 DIAGNOSIS — E785 Hyperlipidemia, unspecified: Secondary | ICD-10-CM

## 2019-04-06 DIAGNOSIS — E669 Obesity, unspecified: Secondary | ICD-10-CM

## 2019-04-06 LAB — POCT GLYCOSYLATED HEMOGLOBIN (HGB A1C): Hemoglobin A1C: 8.2 % — AB (ref 4.0–5.6)

## 2019-04-06 MED ORDER — METFORMIN HCL 500 MG PO TABS: 500.0000 mg | ORAL_TABLET | Freq: Two times a day (BID) | ORAL | 2 refills | Status: DC

## 2019-04-06 NOTE — Patient Instructions (Signed)
Your hemoglobin A1c is 8.2% today.  I recommend starting on Metformin 500 mg twice daily and I sent this to your pharmacy.  I am referring you to the medical nutrition therapist with [facility] health.  They will call you to schedule.  Start checking your blood sugars and let us know if you need help with this.  Fasting blood sugar goal is 80-130  2 hours after a meal the blood sugar goal is 130-160  Follow-up in 3 months for your diabetes     Diabetes Basics  Diabetes (diabetes mellitus) is a long-term (chronic) disease. It occurs when the body does not properly use sugar (glucose) that is released from food after you eat. Diabetes may be caused by one or both of these problems:  Your pancreas does not make enough of a hormone called insulin.  Your body does not react in a normal way to insulin that it makes. Insulin lets sugars (glucose) go into cells in your body. This gives you energy. If you have diabetes, sugars cannot get into cells. This causes high blood sugar (hyperglycemia). Follow these instructions at home: How is diabetes treated? You may need to take insulin or other diabetes medicines daily to keep your blood sugar in balance. Take your diabetes medicines every day as told by your doctor. List your diabetes medicines here: Diabetes medicines  Name of medicine: ______________________________ ? Amount (dose): _______________ Time (a.m./p.m.): _______________ Notes: ___________________________________  Name of medicine: ______________________________ ? Amount (dose): _______________ Time (a.m./p.m.): _______________ Notes: ___________________________________  Name of medicine: ______________________________ ? Amount (dose): _______________ Time (a.m./p.m.): _______________ Notes: ___________________________________ If you use insulin, you will learn how to give yourself insulin by injection. You may need to adjust the amount based on the food that you eat. List the  types of insulin you use here: Insulin  Insulin type: ______________________________ ? Amount (dose): _______________ Time (a.m./p.m.): _______________ Notes: ___________________________________  Insulin type: ______________________________ ? Amount (dose): _______________ Time (a.m./p.m.): _______________ Notes: ___________________________________  Insulin type: ______________________________ ? Amount (dose): _______________ Time (a.m./p.m.): _______________ Notes: ___________________________________  Insulin type: ______________________________ ? Amount (dose): _______________ Time (a.m./p.m.): _______________ Notes: ___________________________________  Insulin type: ______________________________ ? Amount (dose): _______________ Time (a.m./p.m.): _______________ Notes: ___________________________________ How do I manage my blood sugar?  Check your blood sugar levels using a blood glucose monitor as directed by your doctor. Your doctor will set treatment goals for you. Generally, you should have these blood sugar levels:  Before meals (preprandial): 80-130 mg/dL (4.4-7.2 mmol/L).  After meals (postprandial): below 180 mg/dL (10 mmol/L).  A1c level: less than 7%. Write down the times that you will check your blood sugar levels: Blood sugar checks  Time: _______________ Notes: ___________________________________  Time: _______________ Notes: ___________________________________  Time: _______________ Notes: ___________________________________  Time: _______________ Notes: ___________________________________  Time: _______________ Notes: ___________________________________  Time: _______________ Notes: ___________________________________  What do I need to know about low blood sugar? Low blood sugar is called hypoglycemia. This is when blood sugar is at or below 70 mg/dL (3.9 mmol/L). Symptoms may include:  Feeling: ? Hungry. ? Worried or nervous (anxious). ? Sweaty and  clammy. ? Confused. ? Dizzy. ? Sleepy. ? Sick to your stomach (nauseous).  Having: ? A fast heartbeat. ? A headache. ? A change in your vision. ? Tingling or no feeling (numbness) around the mouth, lips, or tongue. ? Jerky movements that you cannot control (seizure).  Having trouble with: ? Moving (coordination). ? Sleeping. ? Passing out (fainting). ? Getting upset easily (irritability). Treating low blood sugar  To treat low blood sugar, eat or drink something sugary right away. If you can think clearly and swallow safely, follow the 15:15 rule:  Take 15 grams of a fast-acting carb (carbohydrate). Talk with your doctor about how much you should take.  Some fast-acting carbs are: ? Sugar tablets (glucose pills). Take 3-4 glucose pills. ? 6-8 pieces of hard candy. ? 4-6 oz (120-150 mL) of fruit juice. ? 4-6 oz (120-150 mL) of regular (not diet) soda. ? 1 Tbsp (15 mL) honey or sugar.  Check your blood sugar 15 minutes after you take the carb.  If your blood sugar is still at or below 70 mg/dL (3.9 mmol/L), take 15 grams of a carb again.  If your blood sugar does not go above 70 mg/dL (3.9 mmol/L) after 3 tries, get help right away.  After your blood sugar goes back to normal, eat a meal or a snack within 1 hour. Treating very low blood sugar If your blood sugar is at or below 54 mg/dL (3 mmol/L), you have very low blood sugar (severe hypoglycemia). This is an emergency. Do not wait to see if the symptoms will go away. Get medical help right away. Call your local emergency services (911 in the [place] Do not drive yourself to the hospital. Questions to ask your health care provider  Do I need to meet with a diabetes educator?  What equipment will I need to care for myself at home?  What diabetes medicines do I need? When should I take them?  How often do I need to check my blood sugar?  What number can I call if I have questions?  When is my next doctor's  visit?  Where can I find a support group for people with diabetes? Where to find more information  American Diabetes Association: www.diabetes.org  American Association of Diabetes Educators: www.diabeteseducator.org/patient-resources Contact a doctor if:  Your blood sugar is at or above 240 mg/dL (13.3 mmol/L) for 2 days in a row.  You have been sick or have had a fever for 2 days or more, and you are not getting better.  You have any of these problems for more than 6 hours: ? You cannot eat or drink. ? You feel sick to your stomach (nauseous). ? You throw up (vomit). ? You have watery poop (diarrhea). Get help right away if:  Your blood sugar is lower than 54 mg/dL (3 mmol/L).  You get confused.  You have trouble: ? Thinking clearly. ? Breathing. Summary  Diabetes (diabetes mellitus) is a long-term (chronic) disease. It occurs when the body does not properly use sugar (glucose) that is released from food after digestion.  Take insulin and diabetes medicines as told.  Check your blood sugar every day, as often as told.  Keep all follow-up visits as told by your doctor. This is important. This information is not intended to replace advice given to you by your health care provider. Make sure you discuss any questions you have with your health care provider. Document Revised: 12/01/2018 Document Reviewed: 06/12/2017 Elsevier Patient Education  [place].

## 2019-04-06 NOTE — Progress Notes (Signed)
Subjective:    Patient ID: [name], female    DOB: [birth year], 59 y.o.   MRN: [id number]  HPI Chief Complaint  Patient presents with  . med check    med check- blister on right side she wants looked at   She is here today for medication management visit. She was seen on 03/28/2019 for pelvic pressure and post menopausal bleeding.  Has an appt with OB/GYN later this month.   [facility]- [facility] [address] Dentist- Dr. Luan Clarke GI- Dr. Fuller Clarke ENT- Dr. Isaac Clarke OB/GYN- [name] at [facility] Cardiologist work up approximately 8 years ago and negative. [place] on [address]. [name] is her psychiatrist    Diabetes with Hgb A1c 6.8% in 11/2018 She does not check her blood sugar at home.  States she has not been able to get her meter to work properly.  She does not have it with her today. She did not start on medication at her previous visit and was hoping to control her diabetes with diet and exercise. States she has been drinking a lot of sweet tea.  Reports a family history of diabetes with mother and sister.  No polyuria or polydipsia.   Recent proteinuria with abnormal 24 hour protein test. Referred to nephrology.   HL-states she is taking atorvastatin 10 mg daily without any issues  Asthma-states this has been stress-induced in the past.  Is not currently on maintenance medication and has not needed albuterol inhaler since 2019.  GERD-states she takes omeprazole daily.  Has not tried to reduce her use but is willing to.  States she has a blister on her right side after sleeping in her girdle.   Effexor -states she recently stopped this medication and is feeling better without it and her heart rate has been in normal range since stopping it  Denies fever, chills, dizziness, chest pain, palpitations, shortness of breath, abdominal pain, N/V/D, urinary symptoms, LE edema.   Reviewed allergies, medications, past medical,  surgical, family, and social history.   Review of Systems Pertinent positives and negatives in the history of present illness.     Objective:   Physical Exam BP 124/84   Pulse 95   Temp 98.4 F (36.9 C)   Wt 247 lb (112 kg)   LMP 06/23/2014   BMI 38.69 kg/m   Alert and oriented and in no acute distress. Right sided of her abdomen with a clear fluid filled blister, intact. No other lesions. No surrounding erythema, induration or fluctuance. Normal sensation of skin.       Assessment & Clarke:  Controlled type 2 diabetes mellitus with complication, without long-term current use of insulin ([place]) - Clarke: HgB A1c, Amb ref to Medical Nutrition Therapy-MNT, metFORMIN (GLUCOPHAGE) 500 MG tablet -Hemoglobin A1c has increased to 8.2%.  New onset diabetes with a hemoglobin A1c of 6.8% in September 2020.  She has been attempting to control her diabetes with diet and exercise.  Has not been able to check her blood sugars at home but she does have a meter and testing strips.  She may bring this by for a nurse visit if she has issues with getting it to work properly. Recommend that she start on medication and she is agreeable.  Metformin 500 mg twice daily sent to the pharmacy.  We discussed potential side effects.  Encouraged her to start checking her blood sugars at home. Recommend cutting back on sugar and carbohydrates. Referral to MNT. Follow up  in   Dyslipidemia- continue on statin.   Mild intermittent asthma without complication- has not been bothersome. No flares and has not needed albuterol.   Gastroesophageal reflux disease, unspecified whether esophagitis present -taking omeprazole daily. Recommend trying to cut back on daily use.   Obesity (BMI 30-39.9) - Clarke: Amb ref to Medical Nutrition Therapy-MNT -counseling on healthy diet and increasing physical activity. Referred to MNT  Skin eruption- blister without infection. Follow up if worsening or is other lesions develop. May use  topical antibiotic.

## 2019-04-14 ENCOUNTER — Other Ambulatory Visit: Payer: Self-pay

## 2019-04-14 ENCOUNTER — Encounter: Payer: Self-pay | Admitting: Family Medicine

## 2019-04-14 ENCOUNTER — Ambulatory Visit (INDEPENDENT_AMBULATORY_CARE_PROVIDER_SITE_OTHER): Payer: Medicare HMO | Admitting: Family Medicine

## 2019-04-14 VITALS — BP 144/90 | HR 124 | Ht 67.0 in | Wt 248.0 lb

## 2019-04-14 DIAGNOSIS — N882 Stricture and stenosis of cervix uteri: Secondary | ICD-10-CM

## 2019-04-14 DIAGNOSIS — N950 Postmenopausal bleeding: Secondary | ICD-10-CM

## 2019-04-14 MED ORDER — MISOPROSTOL 200 MCG PO TABS: ORAL_TABLET | ORAL | 1 refills | Status: DC

## 2019-04-14 NOTE — Progress Notes (Signed)
   Subjective:    Patient ID: [name], female    DOB: 05-22-1960, 59 y.o.   MRN: [id number]  HPI  Patient seen for postmenopausal bleeding.  First episode of bleeding March last year.  She had spotting for a few days, then resolved.  She had an ultrasound in September showing calcified fibroid with endometrial stripe of 4 mm.  She was seen at the office and an endometrial biopsy was attempted, but was aborted due to cervical stenosis.  She had a second episode of spotting in December for a few days. She was taking an OTC supplement at the time. When she stopped the supplement, her spotting stopped. Denies pain, abnormal discharge.  Review of Systems     Objective:   Physical Exam Constitutional:      Appearance: Normal appearance.  Cardiovascular:     Rate and Rhythm: Normal rate and regular rhythm.     Pulses: Normal pulses.     Heart sounds: Normal heart sounds.  Pulmonary:     Effort: Pulmonary effort is normal.     Breath sounds: Normal breath sounds.  Skin:    Capillary Refill: Capillary refill takes less than 2 seconds.  Neurological:     Mental Status: She is alert.       Assessment & Plan:  1. Postmenopausal bleeding 2. Cervical os stenosis Will try to get endometrial biopsy in office after medicating with cytotec the night before. Discussed cramping, nausea as potential side effects of the cytotec. If this fails, may need hysteroscopy with D&C.

## 2019-05-09 DIAGNOSIS — F4312 Post-traumatic stress disorder, chronic: Secondary | ICD-10-CM | POA: Diagnosis not present

## 2019-05-09 DIAGNOSIS — F330 Major depressive disorder, recurrent, mild: Secondary | ICD-10-CM | POA: Diagnosis not present

## 2019-05-09 DIAGNOSIS — R69 Illness, unspecified: Secondary | ICD-10-CM | POA: Diagnosis not present

## 2019-05-12 ENCOUNTER — Other Ambulatory Visit: Payer: Medicare HMO | Admitting: Obstetrics & Gynecology

## 2019-05-18 ENCOUNTER — Ambulatory Visit (INDEPENDENT_AMBULATORY_CARE_PROVIDER_SITE_OTHER): Payer: Medicare HMO | Admitting: Family Medicine

## 2019-05-18 ENCOUNTER — Encounter: Payer: Self-pay | Admitting: Family Medicine

## 2019-05-18 ENCOUNTER — Ambulatory Visit
Admission: RE | Admit: 2019-05-18 | Discharge: 2019-05-18 | Disposition: A | Discharge status: Home / Self Care | Payer: Medicare HMO | Source: Ambulatory Visit | Attending: Family Medicine | Admitting: Family Medicine

## 2019-05-18 ENCOUNTER — Other Ambulatory Visit: Payer: Self-pay

## 2019-05-18 VITALS — BP 130/80 | HR 102 | Temp 97.8°F | Wt 253.8 lb

## 2019-05-18 DIAGNOSIS — G8929 Other chronic pain: Secondary | ICD-10-CM | POA: Diagnosis not present

## 2019-05-18 DIAGNOSIS — M2392 Unspecified internal derangement of left knee: Secondary | ICD-10-CM

## 2019-05-18 DIAGNOSIS — M25511 Pain in right shoulder: Secondary | ICD-10-CM

## 2019-05-18 DIAGNOSIS — M19011 Primary osteoarthritis, right shoulder: Secondary | ICD-10-CM | POA: Diagnosis not present

## 2019-05-18 DIAGNOSIS — M25562 Pain in left knee: Secondary | ICD-10-CM

## 2019-05-18 DIAGNOSIS — M25461 Effusion, right knee: Secondary | ICD-10-CM

## 2019-05-18 DIAGNOSIS — M1712 Unilateral primary osteoarthritis, left knee: Secondary | ICD-10-CM | POA: Diagnosis not present

## 2019-05-18 DIAGNOSIS — M1711 Unilateral primary osteoarthritis, right knee: Secondary | ICD-10-CM | POA: Diagnosis not present

## 2019-05-18 MED ORDER — DICLOFENAC SODIUM 1 % EX GEL: 4.0000 g | Freq: Four times a day (QID) | CUTANEOUS | 0 refills | Status: DC

## 2019-05-18 MED ORDER — DICLOFENAC SODIUM 75 MG PO TBEC: 75.0000 mg | DELAYED_RELEASE_TABLET | Freq: Two times a day (BID) | ORAL | 0 refills | Status: DC

## 2019-05-18 NOTE — Progress Notes (Signed)
Subjective:    Patient ID: [name], female    DOB: [birth year], 59 y.o.   MRN: [id number]  HPI Chief Complaint  Patient presents with  . knee pain    knee pain- week swollen.    Complains of left knee pain and swelling x 1 week. States yesterday she felt a pop and then could not move her knee. She had severe pain. Could not move her knee for 2-3 minutes.  Pain is mainly medial.  She reports her left knee has been giving away for a couple of years. Denies injury.  States she had to limp all day yesterday. She did not any medication. She did ice it.  States her right knee has been giving her problems for years but not as much in the past few weeks. It is swollen today, more so than the left.   She also complains of chronic right shoulder pain. Uses Voltaren gel at times.  History of bursitis in her shoulder. No numbness, tingling or weakness. No radiculopathy.   No fever, chills, N/V/D.    Review of Systems Pertinent positives and negatives in the history of present illness.     Objective:   Physical Exam Constitutional:      General: She is not in acute distress.    Appearance: She is not ill-appearing.  Cardiovascular:     Pulses: Normal pulses.  Musculoskeletal:     Right shoulder: Tenderness present. No bony tenderness. Normal range of motion. Normal strength. Normal pulse.     Cervical back: Normal range of motion and neck supple.     Right knee: Effusion present. Normal range of motion. Normal patellar mobility.     Left knee: No swelling. Tenderness present over the medial joint line. Normal pulse.     Comments: Right shoulder with normal sensation, motion and strength. TTP over right AC joint line. No laxity. Positive Neers and Hawkins Right knee with effusion otherwise normal exam.  Left knee with medial joint line TTP. Negative Mcmurrays   Skin:    General: Skin is warm and dry.     Findings: No erythema.  Neurological:     General: No focal deficit  present.     Mental Status: She is alert and oriented to person, place, and time.     Cranial Nerves: No cranial nerve deficit.     Sensory: No sensory deficit.     Motor: No weakness.     Coordination: Coordination normal.     Gait: Gait normal.    BP 130/80   Pulse (!) 102   Temp 97.8 F (36.6 C)   Wt 253 lb 12.8 oz (115.1 kg)   LMP 06/23/2014   BMI 39.75 kg/m       Assessment & Plan:  Locking of left knee - Plan: diclofenac (VOLTAREN) 75 MG EC tablet, DG Knee Complete 4 Views Left, Ambulatory referral to Orthopedic Surgery, diclofenac Sodium (VOLTAREN) 1 % GEL  Left medial knee pain - Plan: diclofenac (VOLTAREN) 75 MG EC tablet, DG Knee Complete 4 Views Left, Ambulatory referral to Orthopedic Surgery, diclofenac Sodium (VOLTAREN) 1 % GEL  Effusion of right knee - Plan: DG Knee Complete 4 Views Right, Ambulatory referral to Orthopedic Surgery, diclofenac Sodium (VOLTAREN) 1 % GEL  Chronic right shoulder pain - Plan: DG Shoulder Right, Ambulatory referral to Orthopedic Surgery  Concerning symptoms with locking and giving away. No sign of infection or neurovascular deficit.  Plan to have her try oral diclofenac and  topical. Follow up pending XR results and refer to ortho.

## 2019-05-23 ENCOUNTER — Telehealth: Payer: Self-pay

## 2019-05-23 NOTE — Telephone Encounter (Signed)
I submitted a PA for the pts. Diclofenac Sodium Gel and it was approved from 03/25/19-03/23/20

## 2019-05-24 ENCOUNTER — Ambulatory Visit: Payer: Medicare HMO | Admitting: Orthopaedic Surgery

## 2019-05-30 ENCOUNTER — Other Ambulatory Visit: Payer: Medicare HMO | Admitting: Obstetrics & Gynecology

## 2019-06-07 ENCOUNTER — Ambulatory Visit (INDEPENDENT_AMBULATORY_CARE_PROVIDER_SITE_OTHER): Payer: Medicare HMO

## 2019-06-07 ENCOUNTER — Other Ambulatory Visit: Payer: Self-pay

## 2019-06-07 ENCOUNTER — Ambulatory Visit: Payer: Medicare HMO | Admitting: Orthopaedic Surgery

## 2019-06-07 ENCOUNTER — Encounter: Payer: Self-pay | Admitting: Orthopaedic Surgery

## 2019-06-07 ENCOUNTER — Ambulatory Visit: Payer: Self-pay

## 2019-06-07 DIAGNOSIS — M25562 Pain in left knee: Secondary | ICD-10-CM

## 2019-06-07 DIAGNOSIS — G8929 Other chronic pain: Secondary | ICD-10-CM

## 2019-06-07 DIAGNOSIS — M25561 Pain in right knee: Secondary | ICD-10-CM

## 2019-06-07 DIAGNOSIS — M25511 Pain in right shoulder: Secondary | ICD-10-CM

## 2019-06-07 MED ORDER — BUPIVACAINE HCL 0.5 % IJ SOLN
3.0000 mL | INTRAMUSCULAR | Status: AC | PRN
  Administered 2019-06-07: 3 mL via INTRA_ARTICULAR

## 2019-06-07 MED ORDER — METHYLPREDNISOLONE ACETATE 40 MG/ML IJ SUSP
40.0000 mg | INTRAMUSCULAR | Status: AC | PRN
  Administered 2019-06-07: 11:00:00 40 mg via INTRA_ARTICULAR

## 2019-06-07 MED ORDER — LIDOCAINE HCL 1 % IJ SOLN
3.0000 mL | INTRAMUSCULAR | Status: AC | PRN
  Administered 2019-06-07: 3 mL

## 2019-06-07 NOTE — Progress Notes (Signed)
Office Visit Note   Patient: [name]           Date of Birth: 1961-01-19           MRN: [id number] Visit Date: 06/07/2019              Requested by: Girtha Rm, NP-C [place],  [place] [address] PCP: Girtha Rm, NP-C   Assessment & Plan: Visit Diagnoses:  1. Chronic pain of both knees   2. Chronic right shoulder pain     Plan: Impression is bilateral chondromalacia patella and right biceps tendinitis.  Encouraged weight loss and strengthening exercises for knees.  Avoidance of impact activities such as running.  Patient declined formal PT.  Right biceps tendon injected today.  She will notify us if she gets no relief from the shot.  Follow up as needed.  Follow-Up Instructions: Return if symptoms worsen or fail to improve.   Orders:  Orders Placed This Encounter  Procedures  . Large Joint Inj  . XR Knee Complete 4 Views Right  . XR Knee Complete 4 Views Left   No orders of the defined types were placed in this encounter.     Procedures: Large Joint Inj (Biceps tendon) on 06/07/2019 11:27 AM Indications: pain Details: 22 G needle  Arthrogram: No  Medications: 3 mL lidocaine 1 %; 3 mL bupivacaine 0.5 %; 40 mg methylPREDNISolone acetate 40 MG/ML Outcome: tolerated well, no immediate complications Consent was given by the patient. Patient was prepped and draped in the usual sterile fashion.       Clinical Data: No additional findings.   Subjective: Chief Complaint  Patient presents with  . Left Knee - Pain  . Right Knee - Pain  . Right Shoulder - Pain    [name] is a very pleasant 59 year old female comes in for evaluation of bilateral knee pain and right shoulder pain.  This is a referral from her PCP.  She takes oral diclofenac which helps some.  In terms of her knees she notices cracking and pain and occasional swelling that is worse with transitioning from sit to stand and with deep squats.  She has trouble with stairs.   She feels some occasional popping.  She has never had any injections or surgeries to the knees.  The pain is located anteriorly.  For right shoulder she denies any injuries.  Her pain is mainly anterior.  The pain is worse with elevation of arm.  Feels slightly weak.  Denies any numbness tingling or radicular symptoms.   Review of Systems  Constitutional: Negative.   HENT: Negative.   Eyes: Negative.   Respiratory: Negative.   Cardiovascular: Negative.   Endocrine: Negative.   Musculoskeletal: Negative.   Neurological: Negative.   Hematological: Negative.   Psychiatric/Behavioral: Negative.   All other systems reviewed and are negative.    Objective: Vital Signs: LMP 06/23/2014   Physical Exam Vitals and nursing note reviewed.  Constitutional:      Appearance: She is well-developed.  HENT:     Head: Normocephalic and atraumatic.  Pulmonary:     Effort: Pulmonary effort is normal.  Abdominal:     Palpations: Abdomen is soft.  Musculoskeletal:     Cervical back: Neck supple.  Skin:    General: Skin is warm.     Capillary Refill: Capillary refill takes less than 2 seconds.  Neurological:     Mental Status: She is alert and oriented to person, place, and time.  Psychiatric:        Behavior: Behavior normal.        Thought Content: Thought content normal.        Judgment: Judgment normal.     Ortho Exam Bilateral knee exams - no effusion - mild pain with normal ROM - no significant crepitus - collaterals/cruciates stable  Right shoulder exam - tender to biceps tendon - normal manual muscle testing - negative hawkin's, neer, cross body, empty can  Specialty Comments:  No specialty comments available.  Imaging: XR Knee Complete 4 Views Left  Result Date: 06/07/2019 Mild spurring of the patella.  XR Knee Complete 4 Views Right  Result Date: 06/07/2019 Mild spurring of the patella.    PMFS History: Patient Active Problem List   Diagnosis Date Noted  .  Postmenopausal bleeding 12/07/2018  . Panic attacks 06/11/2018  . Mild intermittent asthma without complication [id number]  . Prediabetes 01/28/2017  . Obesity (BMI 30-39.9) 01/28/2017  . Adjustment disorder with anxiety 09/20/2016  . Uterine leiomyoma 03/21/2016  . Dyslipidemia 05/31/2012  . VAGINAL PRURITUS 01/11/2010  . KNEE PAIN, LEFT 07/20/2009  . LYMPHADENOPATHY 07/20/2009  . INSOMNIA 02/06/2009  . ANEMIA, MILD 11/16/2008  . GERD 08/30/2008  . DIZZINESS 08/30/2008  . BIPOLAR DISORDER UNSPECIFIED 07/27/2007  . Anxiety and depression 10/29/2006  . DISORDER, DEPRESSIVE NEC 10/29/2006  . ALLERGIC RHINITIS 10/29/2006  . IRRITABLE BOWEL SYNDROME 10/29/2006  . HYPERCHOLESTEROLEMIA, MILD 09/01/2005  . DISORDER, LIVER NOS 08/02/2002   Past Medical History:  Diagnosis Date  . Abdominal pain    due to fibroids  . Anxiety   . Arthritis   . Asthma    stress induced asthma  . Chlamydia   . Depression   . Fibroid    uterine  . GERD (gastroesophageal reflux disease)   . Hyperlipidemia   . IBS (irritable bowel syndrome)   . Obesity   . Trichomonas   . Tubular adenoma of colon 01/2012    Family History  Problem Relation Age of Onset  . Diabetes Father   . Diabetes Mother   . Hypertension Mother   . Deep vein thrombosis Mother   . Cancer - Other Mother        peritoneal cancer  . Hiatal hernia Mother   . Colon cancer Maternal Grandmother   . Colon polyps Maternal Grandmother   . Hypertension Sister   . Deep vein thrombosis Sister   . Diabetes Sister   . Hiatal hernia Sister   . Rectal cancer Neg Hx   . Stomach cancer Neg Hx   . Pancreatic cancer Neg Hx   . Kidney disease Neg Hx   . Liver disease Neg Hx     Past Surgical History:  Procedure Laterality Date  . colonscopy  2017  . COLPOSCOPY  1990   results were normal  . IR GENERIC HISTORICAL  03/21/2016   IR EMBO TUMOR ORGAN ISCHEMIA INFARCT INC GUIDE ROADMAPPING 03/21/2016 Arne Cleveland, MD [facility]  .  IR GENERIC HISTORICAL  03/21/2016   IR US GUIDE VASC ACCESS RIGHT 03/21/2016 Arne Cleveland, MD [facility]  . IR GENERIC HISTORICAL  03/21/2016   IR ANGIOGRAM SELECTIVE EACH ADDITIONAL VESSEL 03/21/2016 Arne Cleveland, MD [facility]  . IR GENERIC HISTORICAL  03/21/2016   IR ANGIOGRAM SELECTIVE EACH ADDITIONAL VESSEL 03/21/2016 Arne Cleveland, MD [facility]  . IR GENERIC HISTORICAL  03/21/2016   IR ANGIOGRAM PELVIS SELECTIVE OR SUPRASELECTIVE 03/21/2016 Arne Cleveland, MD [facility]  . IR GENERIC HISTORICAL  03/21/2016   IR ANGIOGRAM PELVIS SELECTIVE OR SUPRASELECTIVE 03/21/2016 Arne Cleveland, MD [facility]  . IR GENERIC HISTORICAL  02/27/2016   IR RADIOLOGIST EVAL & MGMT 02/27/2016 Arne Cleveland, MD [facility]  . IR GENERIC HISTORICAL  04/03/2016   IR RADIOLOGIST EVAL & MGMT 04/03/2016 Arne Cleveland, MD [facility]  . IR RADIOLOGIST EVAL & MGMT  08/26/2016  . labial cyst removal    . TUBAL LIGATION     20 years ago   Social History   Occupational History  . Not on file  Tobacco Use  . Smoking status: Never Smoker  . Smokeless tobacco: Never Used  Substance and Sexual Activity  . Alcohol use: Yes    Alcohol/week: 0.0 standard drinks    Comment: occ, not weekly  . Drug use: No  . Sexual activity: Yes

## 2019-06-13 ENCOUNTER — Encounter: Payer: Self-pay | Admitting: Family Medicine

## 2019-06-13 DIAGNOSIS — J342 Deviated nasal septum: Secondary | ICD-10-CM

## 2019-06-14 ENCOUNTER — Encounter: Payer: Self-pay | Admitting: Family Medicine

## 2019-06-14 ENCOUNTER — Telehealth: Payer: Self-pay

## 2019-06-14 NOTE — Telephone Encounter (Signed)
Patient called asking how to take her cytotec. She is scheduled for endometrial biopsy tomorrow afternoon.  Patient instructed to place tablets vaginally at bedtime this evening. Patient made aware to do her bedtime routine before placing them vaginally. Patient made aware that she may have cramping and spotting after inserting the medication. Patient states understanding. Kathrene Alu RN

## 2019-06-15 ENCOUNTER — Ambulatory Visit (INDEPENDENT_AMBULATORY_CARE_PROVIDER_SITE_OTHER): Payer: Medicare HMO | Admitting: Obstetrics & Gynecology

## 2019-06-15 ENCOUNTER — Encounter: Payer: Self-pay | Admitting: Obstetrics & Gynecology

## 2019-06-15 ENCOUNTER — Other Ambulatory Visit: Payer: Self-pay

## 2019-06-15 VITALS — BP 137/90 | HR 126 | Ht 67.0 in | Wt 249.0 lb

## 2019-06-15 DIAGNOSIS — N950 Postmenopausal bleeding: Secondary | ICD-10-CM

## 2019-06-15 NOTE — Progress Notes (Signed)
History:  59 y.o. [id number] here today for eval of PMPB.  She was referred to me for an Endobx after a failed attempt. She was given Cytotec which she placed in her vagina this am.  She reports that yesterday PRIOR to placement of the Cytotec, she has more bleeding.    The following portions of the patient's history were reviewed and updated as appropriate: allergies, current medications, past family history, past medical history, past social history, past surgical history and problem list.  Review of Systems:  Pertinent items are noted in HPI.    Objective:  Physical Exam Blood pressure 137/90, pulse (!) 126, height 5\' 7"  (1.702 m), weight 249 lb (112.9 kg), last menstrual period 06/23/2014.  CONSTITUTIONAL: Well-developed, well-nourished female in no acute distress.  HENT:  Normocephalic, atraumatic EYES: Conjunctivae and EOM are normal. No scleral icterus.  NECK: Normal range of motion SKIN: Skin is warm and dry. No rash noted. Not diaphoretic.No pallor. [place]: Alert and oriented to person, place, and time. Normal coordination.   The indications for endometrial biopsy were reviewed.   Risks of the biopsy including cramping, bleeding, infection, uterine perforation, inadequate specimen and need for additional procedures  were discussed. The patient states she understands and agrees to undergo procedure today. Consent was signed. Time out was performed. Urine HCG was negative. A sterile speculum was placed in the patient's vagina and the cervix was prepped with Betadine. The Cytotec tables were noted in the vaginal vault. A single-toothed tenaculum was placed on the anterior lip of the cervix to stabilize it. An attempt was made to introduce the 3 mm pipelle was introduced into the endometrial cavity without success. I attempted to use the graded dilators without success. A metal uterine sound was attempted as well. Pt was offered a paracervical nerve block but, she declined due to increased  pain. The procedure was aborted.    Assessment & Plan:  PMPB- failed attempt at endometrial sampling in the office. Reviewed with pt surgical management.    Patient desires surgical management with hysterosocpy with D&C and myomectomy using Myosure and endometrial ablation using Novasure.   The risks of surgery were discussed in detail with the patient including but not limited to: bleeding which may require transfusion or reoperation; infection which may require prolonged hospitalization or re-hospitalization and antibiotic therapy; injury to bowel, bladder, ureters and major vessels or other surrounding organs; need for additional procedures including laparotomy; thromboembolic phenomenon, incisional problems and other postoperative or anesthesia complications.  Patient was told that the likelihood that her condition and symptoms will be treated effectively with this surgical management was very high; the postoperative expectations were also discussed in detail. The patient also understands the alternative treatment options which were discussed in full. All questions were answered.  She was told that she will be contacted by our surgical scheduler regarding the time and date of her surgery; routine preoperative instructions of having nothing to eat or drink after midnight on the day prior to surgery and also coming to the hospital 1 1/2 hours prior to her time of surgery were also emphasized.  She was told she may be called for a preoperative appointment about a week prior to surgery and will be given further preoperative instructions at that visit. Printed patient education handouts about the procedure were given to the patient to review at home.  Juandedios Dudash L. Harraway-Smith, M.D., [facility]

## 2019-06-15 NOTE — Patient Instructions (Signed)
Endometrial Ablation Endometrial ablation is a procedure that destroys the thin inner layer of the lining of the uterus (endometrium). This procedure may be done:  To stop heavy periods.  To stop bleeding that is causing anemia.  To control irregular bleeding.  To treat bleeding caused by small tumors (fibroids) in the endometrium. This procedure is often an alternative to major surgery, such as removal of the uterus and cervix (hysterectomy). As a result of this procedure:  You may not be able to have children. However, if you are premenopausal (you have not gone through menopause): ? You may still have a small chance of getting pregnant. ? You will need to use a reliable method of birth control after the procedure to prevent pregnancy.  You may stop having a menstrual period, or you may have only a small amount of bleeding during your period. Menstruation may return several years after the procedure. Tell a health care provider about:  Any allergies you have.  All medicines you are taking, including vitamins, herbs, eye drops, creams, and over-the-counter medicines.  Any problems you or family members have had with the use of anesthetic medicines.  Any blood disorders you have.  Any surgeries you have had.  Any medical conditions you have. What are the risks? Generally, this is a safe procedure. However, problems may occur, including:  A hole (perforation) in the uterus or bowel.  Infection of the uterus, bladder, or vagina.  Bleeding.  Damage to other structures or organs.  An air bubble in the lung (air embolus).  Problems with pregnancy after the procedure.  Failure of the procedure.  Decreased ability to diagnose cancer in the endometrium. What happens before the procedure?  You will have tests of your endometrium to make sure there are no pre-cancerous cells or cancer cells present.  You may have an ultrasound of the uterus.  You may be given medicines to  thin the endometrium.  Ask your health care provider about: ? Changing or stopping your regular medicines. This is especially important if you take diabetes medicines or blood thinners. ? Taking medicines such as aspirin and ibuprofen. These medicines can thin your blood. Do not take these medicines before your procedure if your doctor tells you not to.  Plan to have someone take you home from the hospital or clinic. What happens during the procedure?   You will lie on an exam table with your feet and legs supported as in a pelvic exam.  To lower your risk of infection: ? Your health care team will wash or sanitize their hands and put on germ-free (sterile) gloves. ? Your genital area will be washed with soap.  An IV tube will be inserted into one of your veins.  You will be given a medicine to help you relax (sedative).  A surgical instrument with a light and camera (resectoscope) will be inserted into your vagina and moved into your uterus. This allows your surgeon to see inside your uterus.  Endometrial tissue will be removed using one of the following methods: ? Radiofrequency. This method uses a radiofrequency-alternating electric current to remove the endometrium. ? Cryotherapy. This method uses extreme cold to freeze the endometrium. ? Heated-free liquid. This method uses a heated saltwater (saline) solution to remove the endometrium. ? Microwave. This method uses high-energy microwaves to heat up the endometrium and remove it. ? Thermal balloon. This method involves inserting a catheter with a balloon tip into the uterus. The balloon tip is filled with   heated fluid to remove the endometrium. The procedure may vary among health care providers and hospitals. What happens after the procedure?  Your blood pressure, heart rate, breathing rate, and blood oxygen level will be monitored until the medicines you were given have worn off.  As tissue healing occurs, you may notice  vaginal bleeding for 4-6 weeks after the procedure. You may also experience: ? Cramps. ? Thin, watery vaginal discharge that is light pink or brown in color. ? A need to urinate more frequently than usual. ? Nausea.  Do not drive for 24 hours if you were given a sedative.  Do not have sex or insert anything into your vagina until your health care provider approves. Summary  Endometrial ablation is done to treat the many causes of heavy menstrual bleeding.  The procedure may be done only after medications have been tried to control the bleeding.  Plan to have someone take you home from the hospital or clinic. This information is not intended to replace advice given to you by your health care provider. Make sure you discuss any questions you have with your health care provider. Document Revised: 08/25/2017 Document Reviewed: 03/27/2016 Elsevier Patient Education  2020 [employer] Hysteroscopy Hysteroscopy is a procedure that is used to examine the inside of a woman's womb (uterus). This may be done for various reasons, including:  To look for lumps (tumors) and other growths in the uterus.  To evaluate abnormal bleeding, fibroid tumors, polyps, scar tissue (adhesions), or cancer of the uterus.  To determine the cause of an inability to get pregnant (infertility) or repeated losses of pregnancies (miscarriages).  To find a lost IUD (intrauterine device).  To perform a procedure that permanently prevents pregnancy (sterilization). During this procedure, a thin, flexible tube with a small light and camera (hysteroscope) is used to examine the uterus. The camera sends images to a monitor in the room so that your health care provider can view the inside of your uterus. A hysteroscopy should be done right after a menstrual period to make sure that you are not pregnant. Tell a health care provider about:  Any allergies you have.  All medicines you are taking, including vitamins, herbs,  eye drops, creams, and over-the-counter medicines.  Any problems you or family members have had with the use of anesthetic medicines.  Any blood disorders you have.  Any surgeries you have had.  Any medical conditions you have.  Whether you are pregnant or may be pregnant. What are the risks? Generally, this is a safe procedure. However, problems may occur, including:  Excessive bleeding.  Infection.  Damage to the uterus or other structures or organs.  Allergic reaction to medicines or fluids that are used in the procedure. What happens before the procedure? Staying hydrated Follow instructions from your health care provider about hydration, which may include:  Up to 2 hours before the procedure - you may continue to drink clear liquids, such as water, clear fruit juice, black coffee, and plain tea. Eating and drinking restrictions Follow instructions from your health care provider about eating and drinking, which may include:  8 hours before the procedure - stop eating solid foods and drink clear liquids only  2 hours before the procedure - stop drinking clear liquids. General instructions  Ask your health care provider about: ? Changing or stopping your normal medicines. This is important if you take diabetes medicines or blood thinners. ? Taking medicines such as aspirin and ibuprofen. These medicines can thin   your blood and cause bleeding. Do not take these medicines for 1 week before your procedure, or as told by your health care provider.  Do not use any products that contain nicotine or tobacco for 2 weeks before the procedure. This includes cigarettes and e-cigarettes. If you need help quitting, ask your health care provider.  Medicine may be placed in your cervix the day before the procedure. This medicine causes the cervix to have a larger opening (dilate). The larger opening makes it easier for the hysteroscope to be inserted into the uterus during the  procedure.  Plan to have someone with you for the first 24-48 hours after the procedure, especially if you are given a medicine to make you fall asleep (general anesthetic).  Plan to have someone take you home from the hospital or clinic. What happens during the procedure?  To lower your risk of infection: ? Your health care team will wash or sanitize their hands. ? Your skin will be washed with soap. ? Hair may be removed from the surgical area.  An IV tube will be inserted into one of your veins.  You may be given one or more of the following: ? A medicine to help you relax (sedative). ? A medicine that numbs the area around the cervix (local anesthetic). ? A medicine to make you fall asleep (general anesthetic).  A hysteroscope will be inserted through your vagina and into your uterus.  Air or fluid will be used to enlarge your uterus, enabling your health care provider to see your uterus better. The amount of fluid used will be carefully checked throughout the procedure.  In some cases, tissue may be gently scraped from inside the uterus and sent to a lab for testing (biopsy). The procedure may vary among health care providers and hospitals. What happens after the procedure?  Your blood pressure, heart rate, breathing rate, and blood oxygen level will be monitored until the medicines you were given have worn off.  You may have some cramping. You may be given medicines for this.  You may have bleeding, which varies from light spotting to menstrual-like bleeding. This is normal.  If you had a biopsy done, it is your responsibility to get the results of your procedure. Ask your health care provider, or the department performing the procedure, when your results will be ready. Summary  Hysteroscopy is a procedure that is used to examine the inside of a woman's womb (uterus).  After the procedure, you may have bleeding, which varies from light spotting to menstrual-like bleeding.  This is normal. You may also have cramping.  Plan to have someone take you home from the hospital or clinic. This information is not intended to replace advice given to you by your health care provider. Make sure you discuss any questions you have with your health care provider. Document Revised: 02/20/2017 Document Reviewed: 04/08/2016 Elsevier Patient Education  2020 [employer] Inc[employer]  

## 2019-06-23 ENCOUNTER — Ambulatory Visit (INDEPENDENT_AMBULATORY_CARE_PROVIDER_SITE_OTHER): Payer: Medicare HMO | Admitting: Family Medicine

## 2019-06-23 ENCOUNTER — Encounter: Payer: Self-pay | Admitting: Family Medicine

## 2019-06-23 ENCOUNTER — Other Ambulatory Visit: Payer: Self-pay

## 2019-06-23 VITALS — BP 122/80 | HR 104 | Temp 97.0°F | Wt 247.6 lb

## 2019-06-23 DIAGNOSIS — H938X1 Other specified disorders of right ear: Secondary | ICD-10-CM | POA: Diagnosis not present

## 2019-06-23 DIAGNOSIS — H8110 Benign paroxysmal vertigo, unspecified ear: Secondary | ICD-10-CM | POA: Diagnosis not present

## 2019-06-23 DIAGNOSIS — E1165 Type 2 diabetes mellitus with hyperglycemia: Secondary | ICD-10-CM

## 2019-06-23 DIAGNOSIS — R079 Chest pain, unspecified: Secondary | ICD-10-CM

## 2019-06-23 DIAGNOSIS — R110 Nausea: Secondary | ICD-10-CM | POA: Diagnosis not present

## 2019-06-23 DIAGNOSIS — J4520 Mild intermittent asthma, uncomplicated: Secondary | ICD-10-CM | POA: Diagnosis not present

## 2019-06-23 HISTORY — DX: Chest pain, unspecified: R07.9

## 2019-06-23 MED ORDER — METFORMIN HCL 1000 MG PO TABS: 1000.0000 mg | ORAL_TABLET | Freq: Two times a day (BID) | ORAL | 1 refills | Status: DC

## 2019-06-23 MED ORDER — ALBUTEROL SULFATE HFA 108 (90 BASE) MCG/ACT IN AERS: 2.0000 | INHALATION_SPRAY | Freq: Four times a day (QID) | RESPIRATORY_TRACT | 0 refills | Status: DC | PRN

## 2019-06-23 MED ORDER — MECLIZINE HCL 25 MG PO TABS: 25.0000 mg | ORAL_TABLET | Freq: Two times a day (BID) | ORAL | 0 refills | Status: DC | PRN

## 2019-06-23 MED ORDER — ONDANSETRON 4 MG PO TBDP: 4.0000 mg | ORAL_TABLET | Freq: Three times a day (TID) | ORAL | 0 refills | Status: DC | PRN

## 2019-06-23 NOTE — Patient Instructions (Signed)
Try taking the meclizine 1/2-1 whole tablet as prescribed and see if this helps with your dizziness. I am also prescribing Zofran for nausea.  Change positions very slowly  If you develop severe headache that will not go away, numbness, tingling or any weakness in your face or upper or lower extremities then you should call 911 or go straight to the emergency department.  See your ENT as scheduled Monday.  I am increasing your Metformin dose from 500 mg to 1000 mg twice daily.  Keep a close eye on your blood sugars.  Bring in your readings to your appointment on July 07, 2019     Vertigo Vertigo is the feeling that you or the things around you are moving when they are not. This feeling can come and go at any time. Vertigo often goes away on its own. This condition can be dangerous if it happens when you are doing activities like driving or working with machines. Your doctor will do tests to find the cause of your vertigo. These tests will also help your doctor decide on the best treatment for you. Follow these instructions at home: Eating and drinking      Drink enough fluid to keep your pee (urine) pale yellow.  Do not drink alcohol. Activity  Return to your normal activities as told by your doctor. Ask your doctor what activities are safe for you.  In the morning, first sit up on the side of the bed. When you feel okay, stand slowly while you hold onto something until you know that your balance is fine.  Move slowly. Avoid sudden body or head movements or certain positions, as told by your doctor.  Use a cane if you have trouble standing or walking.  Sit down right away if you feel dizzy.  Avoid doing any tasks or activities that can cause danger to you or others if you get dizzy.  Avoid bending down if you feel dizzy. Place items in your home so that they are easy for you to reach without leaning over.  Do not drive or use heavy machinery if you feel  dizzy. General instructions  Take over-the-counter and prescription medicines only as told by your doctor.  Keep all follow-up visits as told by your doctor. This is important. Contact a doctor if:  Your medicine does not help your vertigo.  You have a fever.  Your problems get worse or you have new symptoms.  Your family or friends see changes in your behavior.  The feeling of being sick to your stomach gets worse.  Your vomiting gets worse.  You lose feeling (have numbness) in part of your body.  You feel prickling and tingling in a part of your body. Get help right away if:  You have trouble moving or talking.  You are always dizzy.  You pass out (faint).  You get very bad headaches.  You feel weak in your hands, arms, or legs.  You have changes in your hearing.  You have changes in how you see (vision).  You get a stiff neck.  Bright light starts to bother you. Summary  Vertigo is the feeling that you or the things around you are moving when they are not.  Your doctor will do tests to find the cause of your vertigo.  You may be told to avoid some tasks, positions, or movements.  Contact a doctor if your medicine is not helping, or if you have a fever, new symptoms, or a  change in behavior.  Get help right away if you get very bad headaches, or if you have changes in how you speak, hear, or see. This information is not intended to replace advice given to you by your health care provider. Make sure you discuss any questions you have with your health care provider. Document Revised: 02/01/2018 Document Reviewed: 02/01/2018 Elsevier Patient Education  2020 [employer].

## 2019-06-23 NOTE — Progress Notes (Signed)
Subjective:    Patient ID: [name], female    DOB: [birth year], 59 y.o.   MRN: [id number]  HPI Chief Complaint  Patient presents with  . dizzy    dizzy since tuesday but having ear pain and popping nose like her ears are full of water. has ENT appt on 06/27/2019. has history of vertigo   She is here with complaints of dizzy spells that started when she sat up 2 days ago. She continues having a spinning sensation with certain position changes and head movements.  She has associated nausea and does not have Zofran at home. History of vertigo.  She has seen ENT.   Reports a clogged sensation in her right ear but no change in hearing.  No tinnitus.  She has not taken any medication for her dizziness.  She recalls taking meclizine in the distant past.  Right ear feels like there is water in it. Feels clogged. This has been ongoing for 6 months.  States she is seeing ENT next week for this as well as to follow up on her deviated septum.   States she had a sharp pain in the right side of her head in February.   Denies fever, chills, blurred or double vision, tinnitus, chest pain, palpitations, shortness of breath, abdominal pain, nausea, vomiting, diarrhea.  Questionable numbness of her right face when she had the sharp pain in the right side of her head a few days ago.  No focal weakness.  States her blood sugars have been elevated significantly recently.  Her last hemoglobin A1c was 8.2%.  She has a follow-up diabetes appointment with me in 2 weeks.  States she needs a refill on albuterol inhaler.  She has been using this more often than usual    Review of Systems Pertinent positives and negatives in the history of present illness.     Objective:   Physical Exam Constitutional:      General: She is not in acute distress.    Appearance: She is not ill-appearing.  HENT:     Head: Atraumatic.     Right Ear: Tympanic membrane and ear canal normal. No decreased hearing  noted. No tenderness. No mastoid tenderness.     Left Ear: Tympanic membrane and ear canal normal. No decreased hearing noted. No tenderness. No mastoid tenderness.  Eyes:     General: Lids are normal. Vision grossly intact. Gaze aligned appropriately.     Extraocular Movements: Extraocular movements intact.     Conjunctiva/sclera: Conjunctivae normal.     Pupils: Pupils are equal, round, and reactive to light.  Neck:     Vascular: No JVD.  Cardiovascular:     Rate and Rhythm: Normal rate and regular rhythm.     Pulses: Normal pulses.     Heart sounds: Normal heart sounds.  Pulmonary:     Effort: Pulmonary effort is normal.     Breath sounds: Normal breath sounds.  Musculoskeletal:        General: Normal range of motion.     Cervical back: Full passive range of motion without pain and neck supple.     Right lower leg: No edema.     Left lower leg: No edema.  Lymphadenopathy:     Cervical: No cervical adenopathy.     Upper Body:     Right upper body: No supraclavicular adenopathy.     Left upper body: No supraclavicular adenopathy.  Skin:    General: Skin is warm and dry.  Capillary Refill: Capillary refill takes less than 2 seconds.  Neurological:     General: No focal deficit present.     Mental Status: She is alert and oriented to person, place, and time.     Cranial Nerves: Cranial nerves are intact.     Sensory: Sensation is intact.     Motor: Motor function is intact. No tremor or pronator drift.     Coordination: Coordination is intact. Romberg sign negative. Finger-Nose-Finger Test normal.     Gait: Gait is intact.     Deep Tendon Reflexes:     Reflex Scores:      Patellar reflexes are 1+ on the right side and 1+ on the left side.   BP 122/80   Pulse (!) 104   Temp (!) 97 F (36.1 C)   Wt 247 lb 9.6 oz (112.3 kg)   LMP 06/23/2014   BMI 38.78 kg/m       Assessment & Plan:  Benign paroxysmal positional vertigo, unspecified laterality - Plan: meclizine  (ANTIVERT) 25 MG tablet -We discussed signs and symptoms of vertigo and this is the diagnosis her symptoms suggest.  In-depth discussion regarding strokelike symptoms and the fact that she has an increased risk for stroke due to uncontrolled diabetes and other chronic health conditions.  Normal neurological exam.  No red flag symptoms.  Strict precautions that if she were to develop any numbness, tingling or weakness or other strokelike symptoms that she will call 911 or go to the ED.  Nausea - Plan: ondansetron (ZOFRAN ODT) 4 MG disintegrating tablet  Sensation of fullness in right ear -Discussed that this is 1 contributing factor.  Considered Mnire's disease however her symptoms are more consistent with vertigo.  I will appreciate the ENT evaluation and recommendations.  She has an appointment on Monday.  Type 2 diabetes mellitus with hyperglycemia, without long-term current use of insulin (HCC) - Plan: metFORMIN (GLUCOPHAGE) 1000 MG tablet, CBC with Differential/Platelet, Comprehensive metabolic panel -Recent hyperglycemic episodes and I recommend that we increase her Metformin dose today instead of waiting until diabetes follow-up.  Metformin 1000 mg twice daily sent to her pharmacy.  Discussed that we may need to consider adding a second agent to her medication regimen and that we can also discuss it once weekly medication to help with weight loss.  Mild intermittent asthma without complication - Plan: albuterol (VENTOLIN HFA) 108 (90 Base) MCG/ACT inhaler -We will refill her albuterol inhaler per her request.  She is aware that if she is needing this more often that we will need to discuss this at her 2-week follow-up

## 2019-06-24 LAB — COMPREHENSIVE METABOLIC PANEL
ALT: 42 IU/L — ABNORMAL HIGH (ref 0–32)
AST: 34 IU/L (ref 0–40)
Albumin/Globulin Ratio: 1.6 (ref 1.2–2.2)
Albumin: 4.4 g/dL (ref 3.8–4.9)
Alkaline Phosphatase: 101 IU/L (ref 39–117)
BUN/Creatinine Ratio: 11 (ref 9–23)
BUN: 12 mg/dL (ref 6–24)
Bilirubin Total: <0.2 mg/dL (ref 0.0–1.2)
CO2: 18 mmol/L — ABNORMAL LOW (ref 20–29)
Calcium: 9.6 mg/dL (ref 8.7–10.2)
Chloride: 104 mmol/L (ref 96–106)
Creatinine, Ser: 1.09 mg/dL — ABNORMAL HIGH (ref 0.57–1.00)
GFR calc Af Amer: 65 mL/min/{1.73_m2} (ref >59)
GFR calc non Af Amer: 56 mL/min/{1.73_m2} — ABNORMAL LOW (ref >59)
Globulin, Total: 2.8 g/dL (ref 1.5–4.5)
Glucose: 249 mg/dL — ABNORMAL HIGH (ref 65–99)
Potassium: 4 mmol/L (ref 3.5–5.2)
Sodium: 142 mmol/L (ref 134–144)
Total Protein: 7.2 g/dL (ref 6.0–8.5)

## 2019-06-24 LAB — CBC WITH DIFFERENTIAL/PLATELET
Basophils Absolute: 0 10*3/uL (ref 0.0–0.2)
Basos: 1 %
EOS (ABSOLUTE): 0 10*3/uL (ref 0.0–0.4)
Eos: 1 %
Hematocrit: 39.5 % (ref 34.0–46.6)
Hemoglobin: 13 g/dL (ref 11.1–15.9)
Immature Grans (Abs): 0 10*3/uL (ref 0.0–0.1)
Immature Granulocytes: 1 %
Lymphocytes Absolute: 2.3 10*3/uL (ref 0.7–3.1)
Lymphs: 38 %
MCH: 24.9 pg — ABNORMAL LOW (ref 26.6–33.0)
MCHC: 32.9 g/dL (ref 31.5–35.7)
MCV: 76 fL — ABNORMAL LOW (ref 79–97)
Monocytes Absolute: 0.3 10*3/uL (ref 0.1–0.9)
Monocytes: 5 %
Neutrophils Absolute: 3.4 10*3/uL (ref 1.4–7.0)
Neutrophils: 54 %
Platelets: 232 10*3/uL (ref 150–450)
RBC: 5.22 x10E6/uL (ref 3.77–5.28)
RDW: 13.8 % (ref 11.7–15.4)
WBC: 6 10*3/uL (ref 3.4–10.8)

## 2019-06-28 DIAGNOSIS — R69 Illness, unspecified: Secondary | ICD-10-CM | POA: Diagnosis not present

## 2019-06-28 DIAGNOSIS — J3489 Other specified disorders of nose and nasal sinuses: Secondary | ICD-10-CM | POA: Diagnosis not present

## 2019-06-28 DIAGNOSIS — J342 Deviated nasal septum: Secondary | ICD-10-CM | POA: Diagnosis not present

## 2019-06-28 DIAGNOSIS — G479 Sleep disorder, unspecified: Secondary | ICD-10-CM | POA: Diagnosis not present

## 2019-06-28 DIAGNOSIS — M26629 Arthralgia of temporomandibular joint, unspecified side: Secondary | ICD-10-CM | POA: Diagnosis not present

## 2019-06-28 DIAGNOSIS — E669 Obesity, unspecified: Secondary | ICD-10-CM | POA: Diagnosis not present

## 2019-06-28 DIAGNOSIS — J343 Hypertrophy of nasal turbinates: Secondary | ICD-10-CM | POA: Diagnosis not present

## 2019-06-29 ENCOUNTER — Other Ambulatory Visit: Payer: Self-pay | Admitting: Family Medicine

## 2019-06-29 DIAGNOSIS — E7800 Pure hypercholesterolemia, unspecified: Secondary | ICD-10-CM

## 2019-06-29 DIAGNOSIS — E118 Type 2 diabetes mellitus with unspecified complications: Secondary | ICD-10-CM

## 2019-06-29 DIAGNOSIS — E119 Type 2 diabetes mellitus without complications: Secondary | ICD-10-CM

## 2019-07-06 NOTE — Progress Notes (Signed)
[name] is a 60 y.o. female who presents for annual wellness visit, CPE and follow-up on chronic medical conditions.  She has the following concerns:  Depression- states her mood is affecting her ability to function. She is having trouble with motivation to perform her ADLs. Is actually able to perform all ADLs.   States she is not taking Remeron at night for her mood any longer. States she thinks it caused her to gain weight.  Does not take Abilify daily,  does not think it is helping  Has not seen her psychiatrist in a while but does see her therapist regularly.  Her daughter is living with her now and this is fine.   Denies SI or HI.   GERD- takes omeprazole as needed.   Diabetes- states her diet is "better". Eating smaller portions. Drinks plenty of water.  Checks BS at home. FBS range 136-166. One FBS of 286 but then it came back down. She ate a sweet before bed.  Taking metformin 1,000 mg twice daily and does not miss doses.  Last Hgb A1c 8.2% on 04/06/2019.   States she is getting her first Covid vaccine later today.   Complains of a several week history of mid-sternal chest pain lasting which feels like a stabbing sensation and lasts a few seconds. Occurs every other day.  Pain occurs at rest and not with exertion. Pain does not radiate and resolves spontaneously.  She also had an episode of shortness of breath noticed yesterday.  She had been exercising earlier.  Felt very tired the rest of the day which is not usual.   Asthma- controlled. No wheezing, coughing. Has not need albuterol lately. Did not use it with exercise yesterday.    Immunization History  Administered Date(s) Administered  . Influenza Whole 02/06/2009  . Influenza,inj,Quad PF,6+ Mos 01/28/2017, 02/02/2018  . Td 08/01/2005   Last Pap smear: 2019  Last mammogram: 2019 Last colonoscopy: 2019 and due in 2024  Last DEXA: never Dentist: Dr. Melony Overly Ophtho: [facility] [address] Exercise: couple of times  per week  Other doctors caring for patient include: Dr. Constance Holster- ENT Dr. Ihor Dow-  OBGYN Dr. Erlinda Hong- Ortho Dr. Fuller Plan- [facility]  Therapist- [facility]. Going weekly   Started volunteering recently. Feels good about this.    Depression screen:  See questionnaire below.  Depression screen [facility] 2/9 07/07/2019 02/02/2018 01/28/2017  Decreased Interest 1 1 2   Down, Depressed, Hopeless 1 1 2   PHQ - 2 Score 2 2 4   Altered sleeping 1 3 2   Tired, decreased energy 1 1 2   Change in appetite 0 1 0  Feeling bad or failure about yourself  1 1 2   Trouble concentrating 3 1 2   Moving slowly or fidgety/restless 3 1 1   Suicidal thoughts 0 1 2  PHQ-9 Score 11 11 15   Difficult doing work/chores Extremely dIfficult Somewhat difficult Somewhat difficult    Fall Risk Screen: see questionnaire below. Fall Risk  07/07/2019 02/02/2018 01/28/2017  Falls in the past year? 0 0 No  Number falls in past yr: 0 0 -  Injury with Fall? 0 0 -    ADL screen:  See questionnaire below Functional Status Survey: Is the patient deaf or have difficulty hearing?: Yes(seen ENT) Does the patient have difficulty seeing, even when wearing glasses/contacts?: Yes Does the patient have difficulty concentrating, remembering, or making decisions?: Yes Does the patient have difficulty walking or climbing stairs?: Yes Does the patient have difficulty dressing or  bathing?: Yes Does the patient have difficulty doing errands alone such as visiting a doctor's office or shopping?: Yes   End of Life Discussion:  Patient does not have a living will and medical power of attorney. Ariel Rolena Infante is her daughter and her son is Kimberlly Hubanks.   Review of Systems Constitutional: -fever, -chills, -sweats, -unexpected weight change, -anorexia, +fatigue Allergy: -sneezing, -itching, -congestion Dermatology: denies changing moles, rash, lumps, new worrisome lesions ENT: -runny nose, -ear pain, -sore throat,  -hoarseness, -sinus pain, -teeth pain, -tinnitus, -hearing loss, -epistaxis Cardiology:  -chest pain, -palpitations, -edema, -orthopnea, -paroxysmal nocturnal dyspnea Respiratory: -cough, -shortness of breath, -dyspnea on exertion, -wheezing, -hemoptysis Gastroenterology: -abdominal pain, -nausea, -vomiting, -diarrhea, -constipation, -blood in stool, -changes in bowel movement, -dysphagia Hematology: -bleeding or bruising problems Musculoskeletal: -arthralgias, -myalgias, -joint swelling, -back pain, -neck pain, -cramping, -gait changes Ophthalmology: -vision changes, -eye redness, -itching, -discharge Urology: -dysuria, -difficulty urinating, -hematuria, -urinary frequency, -urgency, -incontinence Neurology: -headache, -weakness, -tingling, -numbness, -speech abnormality, -memory loss, -falls, -dizziness Psychology:  +depressed mood, -agitation, -sleep problems    PHYSICAL EXAM:  BP 124/80   Pulse 96   Ht 5\' 8"  (1.727 m)   Wt 248 lb 6.4 oz (112.7 kg)   LMP 06/23/2014   BMI 37.77 kg/m   General Appearance: Alert, cooperative, no distress, appears stated age Head: Normocephalic, without obvious abnormality, atraumatic Eyes: PERRL, conjunctiva/corneas clear, EOM's intact Ears: Normal TM's and external ear canals Nose: mask in place  Throat: mask in place  Neck: Supple, no lymphadenopathy; thyroid: no enlargement/tenderness/nodules Back: Spine nontender, no curvature, ROM normal, no CVA tenderness Lungs: Clear to auscultation bilaterally without wheezes, rales or ronchi; respirations unlabored Chest Wall: No tenderness or deformity Heart: Regular rate and rhythm, S1 and S2 normal, no murmur, rub or gallop Breast Exam: OB/GYN Abdomen: Soft, non-tender, nondistended, normoactive bowel sounds, no masses Genitalia: OB/GYN Extremities: No clubbing, cyanosis or edema Pulses: 2+ and symmetric all extremities Skin: Skin color, texture, turgor normal, no rashes or lesions Lymph nodes:  Cervical, supraclavicular, and axillary nodes normal Neurologic: CNII-XII intact, normal strength, sensation and gait Psych: Normal mood, affect, hygiene and grooming.  ASSESSMENT/PLAN: Medicare annual wellness visit, subsequent -Here today for Medicare wellness visit.  Reports difficulty with ADLs due to her major depression.  Her daughter is living with her now which is fine per patient.  No recent falls.  Advanced directives discussed.  Routine general medical examination at a health care facility - Plan: CBC with Differential/Platelet, Comprehensive metabolic panel, TSH, T4, free, T3, Lipid panel -Here today also for a CPE.  Preventive health care reviewed.  She sees her OB/GYN.  Counseling on healthy lifestyle.  Discussed safety.  Major depression, recurrent, chronic ([place]) -She seems significantly depressed which is affecting her ADLs.  I recommend that she call and schedule with a new psychiatrist and continue therapy.  She does not appear to be in any danger.  Denies any thoughts of self-harm.  She is not taking her medication as prescribed due to various reasons.  I encouraged her to discuss this with her psychiatrist.  Type 2 diabetes mellitus with hyperglycemia, without long-term current use of insulin ([place]) - Plan: HgB A1c, Semaglutide,0.25 or 0.5MG /DOS, (OZEMPIC, 0.25 OR 0.5 MG/DOSE,) 2 MG/1.5ML SOPN, CBC with Differential/Platelet, Comprehensive metabolic panel, Microalbumin / creatinine urine ratio -Hemoglobin A1c is now 9.6 and her diabetes is uncontrolled.  Counseling on cutting back on sugar and carbohydrates.  Reportedly she is taking Metformin 1000 mg twice daily.  We discussed additional treatment  options.  I will prescribe Ozempic.  We discussed taking an oral SGLT2.  Discussed that we may need more than two medications depending on how her blood sugars respond.  She is interested in hearing from [facility] regarding her diabetes study.  Chest pain, unspecified type - Plan: EKG  12-Lead, Ambulatory referral to Cardiology -Chest pain does not seem cardiac however with nonspecific ST changes on EKG and multiple risk factors for heart disease, I will refer her to cardiology  Dyslipidemia - Plan: Lipid panel -She is taking a low-dose statin.  Follow-up pending lipid results   Mild intermittent asthma without complication -Controlled.  Has not needed albuterol lately.  Obesity (BMI 30-39.9) - Plan: TSH, T4, free, T3 -Counseled on healthy diet and exercise  Gastroesophageal reflux disease, unspecified whether esophagitis present -Controlled.  Takes omeprazole as needed  Immunization counseling -She is getting her Covid vaccine later today.  Discussed not having any other vaccine within 2 weeks of her Covid vaccines.  Advance directive discussed with patient -Paperwork provided.  Abnormal EKG - Plan: Ambulatory referral to Cardiology  EKG NSR, rate 90, non specific S-T changes, ?LVH. Reviewed by myself and Dr. Redmond School   Discussed monthly self breast exams and yearly mammograms; at least 30 minutes of aerobic activity at least 5 days/week and weight-bearing exercise 2x/week; proper sunscreen use reviewed; healthy diet, including goals of calcium and vitamin D intake and alcohol recommendations (less than or equal to 1 drink/day) reviewed; regular seatbelt use; changing batteries in smoke detectors.  Immunization recommendations discussed.  Colonoscopy recommendations reviewed   Medicare Attestation I have personally reviewed: The patient's medical and social history Their use of alcohol, tobacco or illicit drugs Their current medications and supplements The patient's functional ability including ADLs,fall risks, home safety risks, cognitive, and hearing and visual impairment Diet and physical activities Evidence for depression or mood disorders  The patient's weight, height, and BMI have been recorded in the chart.  I have made referrals, counseling, and  provided education to the patient based on review of the above and I have provided the patient with a written personalized care plan for preventive services.     Harland Dingwall, NP-C   07/07/2019

## 2019-07-07 ENCOUNTER — Encounter: Payer: Self-pay | Admitting: Family Medicine

## 2019-07-07 ENCOUNTER — Ambulatory Visit (INDEPENDENT_AMBULATORY_CARE_PROVIDER_SITE_OTHER): Payer: Medicare HMO | Admitting: Family Medicine

## 2019-07-07 ENCOUNTER — Other Ambulatory Visit: Payer: Self-pay

## 2019-07-07 ENCOUNTER — Ambulatory Visit: Payer: Self-pay

## 2019-07-07 VITALS — BP 124/80 | HR 96 | Ht 68.0 in | Wt 248.4 lb

## 2019-07-07 DIAGNOSIS — Z7189 Other specified counseling: Secondary | ICD-10-CM | POA: Diagnosis not present

## 2019-07-07 DIAGNOSIS — F339 Major depressive disorder, recurrent, unspecified: Secondary | ICD-10-CM | POA: Diagnosis not present

## 2019-07-07 DIAGNOSIS — Z7185 Encounter for immunization safety counseling: Secondary | ICD-10-CM

## 2019-07-07 DIAGNOSIS — Z0000 Encounter for general adult medical examination without abnormal findings: Secondary | ICD-10-CM | POA: Diagnosis not present

## 2019-07-07 DIAGNOSIS — R079 Chest pain, unspecified: Secondary | ICD-10-CM

## 2019-07-07 DIAGNOSIS — K219 Gastro-esophageal reflux disease without esophagitis: Secondary | ICD-10-CM

## 2019-07-07 DIAGNOSIS — E1165 Type 2 diabetes mellitus with hyperglycemia: Secondary | ICD-10-CM | POA: Diagnosis not present

## 2019-07-07 DIAGNOSIS — R9431 Abnormal electrocardiogram [ECG] [EKG]: Secondary | ICD-10-CM | POA: Diagnosis not present

## 2019-07-07 DIAGNOSIS — E669 Obesity, unspecified: Secondary | ICD-10-CM | POA: Diagnosis not present

## 2019-07-07 DIAGNOSIS — J4520 Mild intermittent asthma, uncomplicated: Secondary | ICD-10-CM | POA: Diagnosis not present

## 2019-07-07 DIAGNOSIS — E785 Hyperlipidemia, unspecified: Secondary | ICD-10-CM | POA: Diagnosis not present

## 2019-07-07 DIAGNOSIS — R69 Illness, unspecified: Secondary | ICD-10-CM | POA: Diagnosis not present

## 2019-07-07 LAB — POCT GLYCOSYLATED HEMOGLOBIN (HGB A1C): Hemoglobin A1C: 9.6 % — AB (ref 4.0–5.6)

## 2019-07-07 MED ORDER — OZEMPIC (0.25 OR 0.5 MG/DOSE) 2 MG/1.5ML ~~LOC~~ SOPN: 0.5000 mg | PEN_INJECTOR | SUBCUTANEOUS | 4 refills | Status: DC

## 2019-07-07 NOTE — Patient Instructions (Signed)
[name] , Thank you for taking time to come for your Medicare Wellness Visit. I appreciate your ongoing commitment to your health goals. Please review the following plan we discussed and let me know if I can assist you in the future.   These are the goals we discussed: Cut back on sweets, sugar and carbohydrates. Increase your physical activity.   Continue checking your blood sugars.   I sent in a prescription for once weekly Ozempic. Call and let me know after you get this from your pharmacy.   Please call and schedule a diabetic eye exam.   I will see you back in 2 weeks.   You can call to schedule your appointment with the psychiatrist/counselor. A few offices are listed below for you to call.    [facility] for a psychiatrist  [facility]  (across from [facility])  [address], [place], [place] [address]  Phone: [phone]    This is a list of the screening recommended for you and due dates:  Health Maintenance  Topic Date Due  . Pneumococcal vaccine  Never done  . Eye exam for diabetics  Never done  . Urine Protein Check  Never done  . Tetanus Vaccine  08/02/2015  . Hemoglobin A1C  10/04/2019  . Flu Shot  10/23/2019  . Mammogram  03/22/2020  . Complete foot exam   07/06/2020  . Pap Smear  02/17/2021  . Colon Cancer Screening  08/11/2022  .  Hepatitis C: One time screening is recommended by Center for Disease Control  (CDC) for  adults born from [birth year] through 1965.   Completed  . HIV Screening  Completed     Preventive Care 10-57 Years Old, Female Preventive care refers to visits with your health care provider and lifestyle choices that can promote health and wellness. This includes:  A yearly physical exam. This may also be called an annual well check.  Regular dental visits and eye exams.  Immunizations.  Screening for certain  conditions.  Healthy lifestyle choices, such as eating a healthy diet, getting regular exercise, not using drugs or products that contain nicotine and tobacco, and limiting alcohol use. What can I expect for my preventive care visit? Physical exam Your health care provider will check your:  Height and weight. This may be used to calculate body mass index (BMI), which tells if you are at a healthy weight.  Heart rate and blood pressure.  Skin for abnormal spots. Counseling Your health care provider may ask you questions about your:  Alcohol, tobacco, and drug use.  Emotional well-being.  Home and relationship well-being.  Sexual activity.  Eating habits.  Work and work Statistician.  Method of birth control.  Menstrual cycle.  Pregnancy history. What immunizations do I need?  Influenza (flu) vaccine  This is recommended every year. Tetanus, diphtheria, and pertussis (Tdap) vaccine  You may need a Td booster every 10 years. Varicella (chickenpox) vaccine  You may need this if you have not been vaccinated. Zoster (shingles) vaccine  You may need this after age 7. Measles, mumps, and rubella (MMR) vaccine  You may need at least one dose of MMR if you were born in 1957 or later. You may also need a second dose. Pneumococcal conjugate (PCV13) vaccine  You may need this if you have certain conditions and were not previously  vaccinated. Pneumococcal polysaccharide (PPSV23) vaccine  You may need one or two doses if you smoke cigarettes or if you have certain conditions. Meningococcal conjugate (MenACWY) vaccine  You may need this if you have certain conditions. Hepatitis A vaccine  You may need this if you have certain conditions or if you travel or work in places where you may be exposed to hepatitis A. Hepatitis B vaccine  You may need this if you have certain conditions or if you travel or work in places where you may be exposed to hepatitis B. Haemophilus  influenzae type b (Hib) vaccine  You may need this if you have certain conditions. Human papillomavirus (HPV) vaccine  If recommended by your health care provider, you may need three doses over 6 months. You may receive vaccines as individual doses or as more than one vaccine together in one shot (combination vaccines). Talk with your health care provider about the risks and benefits of combination vaccines. What tests do I need? Blood tests  Lipid and cholesterol levels. These may be checked every 5 years, or more frequently if you are over 5 years old.  Hepatitis C test.  Hepatitis B test. Screening  Lung cancer screening. You may have this screening every year starting at age 34 if you have a 30-pack-year history of smoking and currently smoke or have quit within the past 15 years.  Colorectal cancer screening. All adults should have this screening starting at age 57 and continuing until age 31. Your health care provider may recommend screening at age 13 if you are at increased risk. You will have tests every 1-10 years, depending on your results and the type of screening test.  Diabetes screening. This is done by checking your blood sugar (glucose) after you have not eaten for a while (fasting). You may have this done every 1-3 years.  Mammogram. This may be done every 1-2 years. Talk with your health care provider about when you should start having regular mammograms. This may depend on whether you have a family history of breast cancer.  BRCA-related cancer screening. This may be done if you have a family history of breast, ovarian, tubal, or peritoneal cancers.  Pelvic exam and Pap test. This may be done every 3 years starting at age 63. Starting at age 17, this may be done every 5 years if you have a Pap test in combination with an HPV test. Other tests  Sexually transmitted disease (STD) testing.  Bone density scan. This is done to screen for osteoporosis. You may have this  scan if you are at high risk for osteoporosis. Follow these instructions at home: Eating and drinking  Eat a diet that includes fresh fruits and vegetables, whole grains, lean protein, and low-fat dairy.  Take vitamin and mineral supplements as recommended by your health care provider.  Do not drink alcohol if: ? Your health care provider tells you not to drink. ? You are pregnant, may be pregnant, or are planning to become pregnant.  If you drink alcohol: ? Limit how much you have to 0-1 drink a day. ? Be aware of how much alcohol is in your drink. In the [place] one drink equals one 12 oz bottle of beer (355 mL), one 5 oz glass of wine (148 mL), or one 1 oz glass of hard liquor (44 mL). Lifestyle  Take daily care of your teeth and gums.  Stay active. Exercise for at least 30 minutes on 5 or more days each week.  Do not use any products that contain nicotine or tobacco, such as cigarettes, e-cigarettes, and chewing tobacco. If you need help quitting, ask your health care provider.  If you are sexually active, practice safe sex. Use a condom or other form of birth control (contraception) in order to prevent pregnancy and STIs (sexually transmitted infections).  If told by your health care provider, take low-dose aspirin daily starting at age 55. What's next?  Visit your health care provider once a year for a well check visit.  Ask your health care provider how often you should have your eyes and teeth checked.  Stay up to date on all vaccines. This information is not intended to replace advice given to you by your health care provider. Make sure you discuss any questions you have with your health care provider. Document Revised: 11/19/2017 Document Reviewed: 11/19/2017 Elsevier Patient Education  2020 [employer].

## 2019-07-08 ENCOUNTER — Other Ambulatory Visit: Payer: Self-pay | Admitting: Family Medicine

## 2019-07-08 DIAGNOSIS — J4520 Mild intermittent asthma, uncomplicated: Secondary | ICD-10-CM

## 2019-07-08 LAB — T4, FREE: Free T4: 0.95 ng/dL (ref 0.82–1.77)

## 2019-07-08 LAB — CBC WITH DIFFERENTIAL/PLATELET
Basophils Absolute: 0 10*3/uL (ref 0.0–0.2)
Basos: 1 %
EOS (ABSOLUTE): 0.1 10*3/uL (ref 0.0–0.4)
Eos: 2 %
Hematocrit: 37.3 % (ref 34.0–46.6)
Hemoglobin: 12.2 g/dL (ref 11.1–15.9)
Immature Grans (Abs): 0 10*3/uL (ref 0.0–0.1)
Immature Granulocytes: 0 %
Lymphocytes Absolute: 2.5 10*3/uL (ref 0.7–3.1)
Lymphs: 41 %
MCH: 25.6 pg — ABNORMAL LOW (ref 26.6–33.0)
MCHC: 32.7 g/dL (ref 31.5–35.7)
MCV: 78 fL — ABNORMAL LOW (ref 79–97)
Monocytes Absolute: 0.4 10*3/uL (ref 0.1–0.9)
Monocytes: 6 %
Neutrophils Absolute: 3.1 10*3/uL (ref 1.4–7.0)
Neutrophils: 50 %
Platelets: 234 10*3/uL (ref 150–450)
RBC: 4.77 x10E6/uL (ref 3.77–5.28)
RDW: 13.6 % (ref 11.7–15.4)
WBC: 6.2 10*3/uL (ref 3.4–10.8)

## 2019-07-08 LAB — MICROALBUMIN / CREATININE URINE RATIO
Creatinine, Urine: 158.9 mg/dL
Microalb/Creat Ratio: 285 mg/g creat — ABNORMAL HIGH (ref 0–29)
Microalbumin, Urine: 452.5 ug/mL

## 2019-07-08 LAB — LIPID PANEL
Chol/HDL Ratio: 2.7 ratio (ref 0.0–4.4)
Cholesterol, Total: 157 mg/dL (ref 100–199)
HDL: 59 mg/dL (ref >39)
LDL Chol Calc (NIH): 75 mg/dL (ref 0–99)
Triglycerides: 131 mg/dL (ref 0–149)
VLDL Cholesterol Cal: 23 mg/dL (ref 5–40)

## 2019-07-08 LAB — COMPREHENSIVE METABOLIC PANEL
ALT: 28 IU/L (ref 0–32)
AST: 28 IU/L (ref 0–40)
Albumin/Globulin Ratio: 1.7 (ref 1.2–2.2)
Albumin: 4.4 g/dL (ref 3.8–4.9)
Alkaline Phosphatase: 90 IU/L (ref 39–117)
BUN/Creatinine Ratio: 11 (ref 9–23)
BUN: 8 mg/dL (ref 6–24)
Bilirubin Total: 0.2 mg/dL (ref 0.0–1.2)
CO2: 24 mmol/L (ref 20–29)
Calcium: 9.4 mg/dL (ref 8.7–10.2)
Chloride: 104 mmol/L (ref 96–106)
Creatinine, Ser: 0.72 mg/dL (ref 0.57–1.00)
GFR calc Af Amer: 107 mL/min/{1.73_m2} (ref >59)
GFR calc non Af Amer: 93 mL/min/{1.73_m2} (ref >59)
Globulin, Total: 2.6 g/dL (ref 1.5–4.5)
Glucose: 153 mg/dL — ABNORMAL HIGH (ref 65–99)
Potassium: 3.9 mmol/L (ref 3.5–5.2)
Sodium: 142 mmol/L (ref 134–144)
Total Protein: 7 g/dL (ref 6.0–8.5)

## 2019-07-08 LAB — T3: T3, Total: 121 ng/dL (ref 71–180)

## 2019-07-08 LAB — TSH: TSH: 2.12 u[IU]/mL (ref 0.450–4.500)

## 2019-07-08 NOTE — Progress Notes (Signed)
Her urine shows an elevated protein level. She has a history of this. Has she seen her kidney specialist lately?

## 2019-07-12 ENCOUNTER — Encounter: Payer: Self-pay | Admitting: Cardiology

## 2019-07-12 ENCOUNTER — Other Ambulatory Visit: Payer: Self-pay

## 2019-07-12 ENCOUNTER — Ambulatory Visit: Payer: Medicare HMO | Admitting: Cardiology

## 2019-07-12 VITALS — BP 128/86 | HR 106 | Ht 68.0 in | Wt 245.0 lb

## 2019-07-12 DIAGNOSIS — R079 Chest pain, unspecified: Secondary | ICD-10-CM

## 2019-07-12 DIAGNOSIS — E785 Hyperlipidemia, unspecified: Secondary | ICD-10-CM | POA: Diagnosis not present

## 2019-07-12 DIAGNOSIS — E1169 Type 2 diabetes mellitus with other specified complication: Secondary | ICD-10-CM | POA: Diagnosis not present

## 2019-07-12 MED ORDER — METOPROLOL TARTRATE 100 MG PO TABS: 100.0000 mg | ORAL_TABLET | Freq: Once | ORAL | 0 refills | Status: DC

## 2019-07-12 NOTE — Progress Notes (Signed)
Cardiology Office Note:    Date:  07/12/2019   ID:  [name], DOB 1961/01/01, MRN [id number]  PCP:  Girtha Rm, NP-C  Cardiologist:  No primary care provider on file.  Electrophysiologist:  None   Referring MD: Girtha Rm, NP-C     History of Present Illness:    [name] is a 59 y.o. female here for the evaluation of chest pain with abnormal EKG at the request of Mack Hook, NP-Dr. Redmond [employer].  In review of office note from 07/07/2019, she has been complaining of several weeks history of midsternal chest discomfort lasting a few seconds in duration with the quality of stabbing-like sensation.  Usually occurs every other day.  Mostly occurs with rest.  Does not seem to occur with exertional activity.  Does not radiate down her arms or up neck or to jaw but seems to spontaneously resolve.  1 episode was accompanied with shortness of breath.  Felt quite tired.  COVID shot - stabbing pain in breast. Nurse said caffine may cause. Switch to decaf.   HR has been 110. Buspar used to increase a bit. Better sice   She has cardiac risk factors of diabetes, hemoglobin A1c 9.6 on Metformin. Is being treated for major depression. Hyperlipidemia Asthma Morbid obesity GERD  Mother's mother had CHF died 22.    Past Medical History:  Diagnosis Date  . Abdominal pain    due to fibroids  . Anxiety   . Arthritis   . Asthma    stress induced asthma  . Chlamydia   . Depression   . Fibroid    uterine  . GERD (gastroesophageal reflux disease)   . Hyperlipidemia   . IBS (irritable bowel syndrome)   . Obesity   . Trichomonas   . Tubular adenoma of colon 01/2012    Past Surgical History:  Procedure Laterality Date  . colonscopy  2017  . COLPOSCOPY  1990   results were normal  . IR GENERIC HISTORICAL  03/21/2016   IR EMBO TUMOR ORGAN ISCHEMIA INFARCT INC GUIDE ROADMAPPING 03/21/2016 Arne Cleveland, MD [facility]  . IR GENERIC HISTORICAL  03/21/2016    IR US GUIDE VASC ACCESS RIGHT 03/21/2016 Arne Cleveland, MD [facility]  . IR GENERIC HISTORICAL  03/21/2016   IR ANGIOGRAM SELECTIVE EACH ADDITIONAL VESSEL 03/21/2016 Arne Cleveland, MD [facility]  . IR GENERIC HISTORICAL  03/21/2016   IR ANGIOGRAM SELECTIVE EACH ADDITIONAL VESSEL 03/21/2016 Arne Cleveland, MD [facility]  . IR GENERIC HISTORICAL  03/21/2016   IR ANGIOGRAM PELVIS SELECTIVE OR SUPRASELECTIVE 03/21/2016 Arne Cleveland, MD [facility]  . IR GENERIC HISTORICAL  03/21/2016   IR ANGIOGRAM PELVIS SELECTIVE OR SUPRASELECTIVE 03/21/2016 Arne Cleveland, MD [facility]  . IR GENERIC HISTORICAL  02/27/2016   IR RADIOLOGIST EVAL & MGMT 02/27/2016 Arne Cleveland, MD [facility]  . IR GENERIC HISTORICAL  04/03/2016   IR RADIOLOGIST EVAL & MGMT 04/03/2016 Arne Cleveland, MD [facility]  . IR RADIOLOGIST EVAL & MGMT  08/26/2016  . labial cyst removal    . TUBAL LIGATION     20 years ago    Current Medications: Current Meds  Medication Sig  . albuterol (VENTOLIN HFA) 108 (90 Base) MCG/ACT inhaler INHALE 2 PUFFS INTO THE LUNGS EVERY 6 (SIX) HOURS AS NEEDED FOR WHEEZING.  Marland Kitchen ARIPiprazole (ABILIFY) 2 MG tablet Take 2 mg by mouth daily.  . Ascorbic Acid (VITAMIN C) 1000 MG tablet Take 3,000 mg by mouth daily.  Marland Kitchen  aspirin 81 MG tablet Take 81 mg by mouth daily.    Marland Kitchen atorvastatin (LIPITOR) 10 MG tablet TAKE 1 TABLET BY MOUTH EVERY DAY  . Cyanocobalamin (B-12 PO) Take by mouth.  . diclofenac (VOLTAREN) 75 MG EC tablet Take 1 tablet (75 mg total) by mouth 2 (two) times daily.  . diclofenac Sodium (VOLTAREN) 1 % GEL Apply 4 g topically 4 (four) times daily.  . fluticasone (FLONASE) 50 MCG/ACT nasal spray SPRAY 2 SPRAYS INTO EACH NOSTRIL EVERY DAY  . gabapentin (NEURONTIN) 100 MG capsule 100 mg. 200mg  in am and 100mg  at night  . Glucose Blood (BLOOD GLUCOSE TEST STRIPS) STRP Test once a day. Pt has a onetouch verio flex meter dx e11.9  . hyoscyamine (LEVBID) 0.375 MG 12 hr  tablet TAKE 1 TABLET (0.375 MG TOTAL) BY MOUTH EVERY 12 (TWELVE) HOURS AS NEEDED.  Marland Kitchen Lancets (ONETOUCH ULTRASOFT) lancets Test once a day. Dx E11.9 pt has onetouch verio flex meter  . meclizine (ANTIVERT) 25 MG tablet Take 1 tablet (25 mg total) by mouth 2 (two) times daily as needed for dizziness.  . Melatonin 5 MG CAPS Take 5 mg by mouth at bedtime as needed (sleep).   . metFORMIN (GLUCOPHAGE) 1000 MG tablet Take 1 tablet (1,000 mg total) by mouth 2 (two) times daily with a meal.  . mirtazapine (REMERON) 30 MG tablet Take 30 mg by mouth at bedtime.  . Multiple Vitamin (MULTIVITAMIN) tablet Take 1 tablet daily by mouth.  Marland Kitchen omeprazole (PRILOSEC) 20 MG capsule Take 1 capsule (20 mg total) by mouth daily.  . ondansetron (ZOFRAN ODT) 4 MG disintegrating tablet Take 1 tablet (4 mg total) by mouth every 8 (eight) hours as needed for nausea or vomiting.  . Semaglutide,0.25 or 0.5MG /DOS, (OZEMPIC, 0.25 OR 0.5 MG/DOSE,) 2 MG/1.5ML SOPN Inject 0.5 mg into the skin once a week.  . venlafaxine XR (EFFEXOR-XR) 75 MG 24 hr capsule TAKE 1 CAPSULE (75 MG TOTAL) BY MOUTH DAILY WITH BREAKFAST. (Patient taking differently: Take 150 mg by mouth 2 (two) times daily after a meal. )     Allergies:   Lamictal [lamotrigine]   Social History   Socioeconomic History  . Marital status: Divorced    Spouse name: Not on file  . Number of children: 2  . Years of education: Not on file  . Highest education level: Not on file  Occupational History  . Not on file  Tobacco Use  . Smoking status: Never Smoker  . Smokeless tobacco: Never Used  Substance and Sexual Activity  . Alcohol use: Yes    Alcohol/week: 0.0 standard drinks    Comment: occ, not weekly  . Drug use: No  . Sexual activity: Yes  Other Topics Concern  . Not on file  Social History Narrative  . Not on file   Social Determinants of Health   Financial Resource Strain:   . Difficulty of Paying Living Expenses:   Food Insecurity:   . Worried About  Charity fundraiser in the Last Year:   . Arboriculturist in the Last Year:   Transportation Needs:   . Film/video editor (Medical):   Marland Kitchen Lack of Transportation (Non-Medical):   Physical Activity:   . Days of Exercise per Week:   . Minutes of Exercise per Session:   Stress:   . Feeling of Stress :   Social Connections:   . Frequency of Communication with Friends and Family:   . Frequency of Social Gatherings  with Friends and Family:   . Attends Religious Services:   . Active Member of Clubs or Organizations:   . Attends Archivist Meetings:   Marland Kitchen Marital Status:      Family History: The patient's family history includes Cancer - Other in her mother; Colon cancer in her maternal grandmother; Colon polyps in her maternal grandmother; Deep vein thrombosis in her mother and sister; Diabetes in her father, mother, and sister; Hiatal hernia in her mother and sister; Hypertension in her mother and sister. There is no history of Rectal cancer, Stomach cancer, Pancreatic cancer, Kidney disease, or Liver disease.  ROS:   Please see the history of present illness.    Nuys any fevers chills nausea vomiting syncope bleeding.  Thyroid function normal.  All other systems reviewed and are negative.  EKGs/Labs/Other Studies Reviewed:    The following studies were reviewed today:  Nuclear stress test 06/11/2012-low risk no ischemia  Stress echo 04/04/2010-low risk, no ischemia  EKG:  EKG is  ordered today.  The ekg ordered today demonstrates sinus tachycardia 106 with nonspecific ST-T wave changes EKG from 07/07/2019 shows sinus rhythm 90 with nonspecific ST-T wave changes, borderline LVH.  Recent Labs: 07/07/2019: ALT 28; BUN 8; Creatinine, Ser 0.72; Hemoglobin 12.2; Platelets 234; Potassium 3.9; Sodium 142; TSH 2.120  Recent Lipid Panel    Component Value Date/Time   CHOL 157 07/07/2019 0938   TRIG 131 07/07/2019 0938   HDL 59 07/07/2019 0938   CHOLHDL 2.7 07/07/2019 0938    CHOLHDL 3.3 Ratio 10/26/2008 2221   VLDL 20 10/26/2008 2221   LDLCALC 75 07/07/2019 0938    Physical Exam:    VS:  BP 128/86   Pulse (!) 106   Ht 5\' 8"  (1.727 m)   Wt 245 lb (111.1 kg)   LMP 06/23/2014   SpO2 97%   BMI 37.25 kg/m     Wt Readings from Last 3 Encounters:  07/12/19 245 lb (111.1 kg)  07/07/19 248 lb 6.4 oz (112.7 kg)  06/23/19 247 lb 9.6 oz (112.3 kg)     GEN:  Well nourished, well developed in no acute distress, overweight HEENT: Normal NECK: No JVD; No carotid bruits LYMPHATICS: No lymphadenopathy CARDIAC: RRR, no murmurs, rubs, gallops RESPIRATORY:  Clear to auscultation without rales, wheezing or rhonchi  ABDOMEN: Soft, non-tender, non-distended MUSCULOSKELETAL:  No edema; No deformity  SKIN: Warm and dry NEUROLOGIC:  Alert and oriented x 3 PSYCHIATRIC:  Normal affect   ASSESSMENT:    1. Chest pain, unspecified type   2. Type 2 diabetes mellitus with hyperlipidemia (HCC)    PLAN:    In order of problems listed above:  Atypical chest pain -Cardiac risk factors of diabetes with hyperlipidemia, morbid obesity.  We will go ahead and proceed with coronary CT with possible FFR analysis.  Diabetes is a extremely strong coronary disease risk factor.  She has decreased caffeine.  Heart rate has been quite elevated most of the time for her.  Got better when she stopped the BuSpar.  We will definitely beta-blockade with metoprolol.  If this is unsuccessful, could always consider ivabradine prior to CT scan  Diabetes with hyperlipidemia -Currently on atorvastatin 10 mg daily.  Low-dose.  LDL 75.  Morbid obesity -BMI greater than 35 with 2 or more comorbidities.  Continue to encourage weight loss, diet, exercise.   Medication Adjustments/Labs and Tests Ordered: Current medicines are reviewed at length with the patient today.  Concerns regarding medicines are outlined above.  Orders Placed This Encounter  Procedures  . CT CORONARY MORPH W/CTA COR W/SCORE  W/CA W/CM &/OR WO/CM  . CT CORONARY FRACTIONAL FLOW RESERVE DATA PREP  . CT CORONARY FRACTIONAL FLOW RESERVE FLUID ANALYSIS  . EKG 12-Lead   Meds ordered this encounter  Medications  . metoprolol tartrate (LOPRESSOR) 100 MG tablet    Sig: Take 1 tablet (100 mg total) by mouth once for 1 dose. Take (1) tablet 2 hours before your coronary CT    Dispense:  1 tablet    Refill:  0    Patient Instructions  Medication Instructions:  The current medical regimen is effective;  continue present plan and medications.  *If you need a refill on your cardiac medications before your next appointment, please call your pharmacy*  Testing/Procedures: Your physician has requested that you have Coronary CT. Cardiac computed tomography (CT) is a painless test that uses an x-ray machine to take clear, detailed pictures of your heart.   Follow-Up: Follow up will be determined after the above testing has been completed.  At [facility], you and your health needs are our priority.  As part of our continuing mission to provide you with exceptional heart care, we have created designated Provider Care Teams.  These Care Teams include your primary Cardiologist (physician) and Advanced Practice Providers (APPs -  Physician Assistants and Nurse Practitioners) who all work together to provide you with the care you need, when you need it.  We recommend signing up for the patient portal called "MyChart".  Sign up information is provided on this After Visit Summary.  MyChart is used to connect with patients for Virtual Visits (Telemedicine).  Patients are able to view lab/test results, encounter notes, upcoming appointments, etc.  Non-urgent messages can be sent to your provider as well.   To learn more about what you can do with MyChart, go to [email or web address].    Thank you for choosing [facility]!!    Your cardiac CT will be scheduled at:   [facility] [address] [place], [place] [address] [phone]  Please arrive at the [facility] main entrance of [facility] 30 minutes prior to test start time. Proceed to the [facility] Radiology Department (first floor) to check-in and test prep.  Please follow these instructions carefully (unless otherwise directed):  On the Night Before the Test: . Be sure to Drink plenty of water. . Do not consume any caffeinated/decaffeinated beverages or chocolate 12 hours prior to your test. . Do not take any antihistamines 12 hours prior to your test. . If you take Metformin do not take 24 hours prior to test.  On the Day of the Test: . Drink plenty of water. Do not drink any water within one hour of the test. . Do not eat any food 4 hours prior to the test. . You may take your regular medications prior to the test.  . Take metoprolol (Lopressor) two hours prior to test. . HOLD Furosemide/Hydrochlorothiazide morning of the test. . FEMALES- please wear underwire-free bra if available      After the Test: . Drink plenty of water. . After receiving IV contrast, you may experience a mild flushed feeling. This is normal. . On occasion, you may experience a mild rash up to 24 hours after the test. This is not dangerous. If this occurs, you can take Benadryl 25 mg and increase your fluid intake. . If you experience trouble breathing, this can be serious.  If it is severe call 911 IMMEDIATELY. If it is mild, please call our office. . If you take any of these medications: Glipizide/Metformin, Avandament, Glucavance, please do not take 48 hours after completing test unless otherwise instructed.   Once we have confirmed authorization from your insurance company, we will call you to set up a date and time for your test.   For non-scheduling related questions, please contact the cardiac imaging nurse navigator should you have any questions/concerns: Marchia Bond, RN Navigator Cardiac Imaging [facility] [phone] office  For scheduling needs, including cancellations and rescheduling, please call [phone].       Signed, Candee Furbish, MD  07/12/2019 9:51 AM    [facility]

## 2019-07-12 NOTE — Patient Instructions (Addendum)
Medication Instructions:  The current medical regimen is effective;  continue present plan and medications.  *If you need a refill on your cardiac medications before your next appointment, please call your pharmacy*  Testing/Procedures: Your physician has requested that you have Coronary CT. Cardiac computed tomography (CT) is a painless test that uses an x-ray machine to take clear, detailed pictures of your heart.   Follow-Up: Follow up will be determined after the above testing has been completed.  At [facility], you and your health needs are our priority.  As part of our continuing mission to provide you with exceptional heart care, we have created designated Provider Care Teams.  These Care Teams include your primary Cardiologist (physician) and Advanced Practice Providers (APPs -  Physician Assistants and Nurse Practitioners) who all work together to provide you with the care you need, when you need it.  We recommend signing up for the patient portal called "MyChart".  Sign up information is provided on this After Visit Summary.  MyChart is used to connect with patients for Virtual Visits (Telemedicine).  Patients are able to view lab/test results, encounter notes, upcoming appointments, etc.  Non-urgent messages can be sent to your provider as well.   To learn more about what you can do with MyChart, go to [email or web address].    Thank you for choosing [facility]!!    Your cardiac CT will be scheduled at:   [facility] [address] [place], [place] [address] [phone]  Please arrive at the [facility] main entrance of [facility] 30 minutes prior to test start time. Proceed to the [facility] Radiology Department (first floor) to check-in and test prep.  Please follow these instructions carefully (unless otherwise directed):  On the Night Before the Test: . Be sure to Drink plenty of water. . Do not consume any  caffeinated/decaffeinated beverages or chocolate 12 hours prior to your test. . Do not take any antihistamines 12 hours prior to your test. . If you take Metformin do not take 24 hours prior to test.  On the Day of the Test: . Drink plenty of water. Do not drink any water within one hour of the test. . Do not eat any food 4 hours prior to the test. . You may take your regular medications prior to the test.  . Take metoprolol (Lopressor) two hours prior to test. . HOLD Furosemide/Hydrochlorothiazide morning of the test. . FEMALES- please wear underwire-free bra if available      After the Test: . Drink plenty of water. . After receiving IV contrast, you may experience a mild flushed feeling. This is normal. . On occasion, you may experience a mild rash up to 24 hours after the test. This is not dangerous. If this occurs, you can take Benadryl 25 mg and increase your fluid intake. . If you experience trouble breathing, this can be serious. If it is severe call 911 IMMEDIATELY. If it is mild, please call our office. . If you take any of these medications: Glipizide/Metformin, Avandament, Glucavance, please do not take 48 hours after completing test unless otherwise instructed.   Once we have confirmed authorization from your insurance company, we will call you to set up a date and time for your test.   For non-scheduling related questions, please contact the cardiac imaging nurse navigator should you have any questions/concerns: Marchia Bond, RN Navigator Cardiac Imaging [facility] [phone] office  For scheduling needs, including cancellations  and rescheduling, please call [phone].

## 2019-07-14 DIAGNOSIS — Z20828 Contact with and (suspected) exposure to other viral communicable diseases: Secondary | ICD-10-CM | POA: Diagnosis not present

## 2019-07-14 DIAGNOSIS — Z03818 Encounter for observation for suspected exposure to other biological agents ruled out: Secondary | ICD-10-CM | POA: Diagnosis not present

## 2019-07-20 DIAGNOSIS — R69 Illness, unspecified: Secondary | ICD-10-CM | POA: Diagnosis not present

## 2019-07-20 DIAGNOSIS — Z008 Encounter for other general examination: Secondary | ICD-10-CM | POA: Diagnosis not present

## 2019-07-20 DIAGNOSIS — F411 Generalized anxiety disorder: Secondary | ICD-10-CM | POA: Diagnosis not present

## 2019-07-20 DIAGNOSIS — E1165 Type 2 diabetes mellitus with hyperglycemia: Secondary | ICD-10-CM | POA: Diagnosis not present

## 2019-07-20 DIAGNOSIS — K219 Gastro-esophageal reflux disease without esophagitis: Secondary | ICD-10-CM | POA: Diagnosis not present

## 2019-07-20 DIAGNOSIS — R32 Unspecified urinary incontinence: Secondary | ICD-10-CM | POA: Diagnosis not present

## 2019-07-20 DIAGNOSIS — J45909 Unspecified asthma, uncomplicated: Secondary | ICD-10-CM | POA: Diagnosis not present

## 2019-07-20 DIAGNOSIS — E785 Hyperlipidemia, unspecified: Secondary | ICD-10-CM | POA: Diagnosis not present

## 2019-07-20 DIAGNOSIS — G8929 Other chronic pain: Secondary | ICD-10-CM | POA: Diagnosis not present

## 2019-07-20 DIAGNOSIS — M1990 Unspecified osteoarthritis, unspecified site: Secondary | ICD-10-CM | POA: Diagnosis not present

## 2019-07-21 ENCOUNTER — Ambulatory Visit (INDEPENDENT_AMBULATORY_CARE_PROVIDER_SITE_OTHER): Payer: Medicare HMO | Admitting: Family Medicine

## 2019-07-21 ENCOUNTER — Encounter: Payer: Self-pay | Admitting: Family Medicine

## 2019-07-21 ENCOUNTER — Other Ambulatory Visit: Payer: Self-pay

## 2019-07-21 VITALS — BP 140/80 | HR 99 | Wt 245.4 lb

## 2019-07-21 DIAGNOSIS — E1165 Type 2 diabetes mellitus with hyperglycemia: Secondary | ICD-10-CM | POA: Diagnosis not present

## 2019-07-21 NOTE — Progress Notes (Signed)
   Subjective:    Patient ID: [name], female    DOB: 07-25-1960, 59 y.o.   MRN: [id number]  HPI Chief Complaint  Patient presents with  . BS    Blood sugars been high- 114- 180. sometimes eating sweet before bed   She is here for a 2 week follow up on chest pain and uncontrolled diabetes.  She recently saw cardiology.  She is in the process of having a cardiac work-up and has an upcoming coronary CT scheduled. States she has not had any chest pain since our last visit.  Denies any cardiopulmonary symptoms  Uncontrolled diabetes and her blood sugars are improving since starting on Ozempic at her last visit.  Reports taking the injection weekly without any problems.  No other concerns today.    Review of Systems Pertinent positives and negatives in the history of present illness.     Objective:   Physical Exam BP 140/80   Pulse 99   Wt 245 lb 6.4 oz (111.3 kg)   LMP 06/23/2014   SpO2 98%   BMI 37.31 kg/m   Alert and oriented in no acute distress.  Not otherwise examined.      Assessment & Plan:  Type 2 diabetes mellitus with hyperglycemia, without long-term current use of insulin (HCC)  She seems to be doing fine with the addition of Ozempic for her diabetes.  She has not had any additional chest pain.  She is under the care of her cardiologist now.

## 2019-07-22 LAB — HM DIABETES EYE EXAM

## 2019-07-30 ENCOUNTER — Other Ambulatory Visit: Payer: Self-pay | Admitting: Family Medicine

## 2019-07-30 DIAGNOSIS — M25461 Effusion, right knee: Secondary | ICD-10-CM

## 2019-07-30 DIAGNOSIS — M2392 Unspecified internal derangement of left knee: Secondary | ICD-10-CM

## 2019-07-30 DIAGNOSIS — M25562 Pain in left knee: Secondary | ICD-10-CM

## 2019-08-01 NOTE — Telephone Encounter (Signed)
Is this okay to refill? 

## 2019-08-03 ENCOUNTER — Telehealth (HOSPITAL_COMMUNITY): Payer: Self-pay | Admitting: *Deleted

## 2019-08-03 NOTE — Telephone Encounter (Signed)
Reaching out to patient to offer assistance regarding upcoming cardiac imaging study; pt verbalizes understanding of appt date/time, parking situation and where to check in, pre-test NPO status and medications ordered, and verified current allergies; name and call back number provided for further questions should they arise  Zackariah Vanderpol Tai RN Navigator Cardiac Imaging [facility] [phone] office [phone] cell 

## 2019-08-04 ENCOUNTER — Ambulatory Visit (HOSPITAL_COMMUNITY)
Admission: RE | Admit: 2019-08-04 | Discharge: 2019-08-04 | Disposition: A | Discharge status: Home / Self Care | Payer: Medicare HMO | Source: Ambulatory Visit | Attending: Cardiology | Admitting: Cardiology

## 2019-08-04 ENCOUNTER — Encounter (HOSPITAL_COMMUNITY): Payer: Self-pay

## 2019-08-04 ENCOUNTER — Encounter: Payer: Medicare HMO | Admitting: *Deleted

## 2019-08-04 ENCOUNTER — Other Ambulatory Visit: Payer: Self-pay

## 2019-08-04 DIAGNOSIS — E1169 Type 2 diabetes mellitus with other specified complication: Secondary | ICD-10-CM | POA: Diagnosis present

## 2019-08-04 DIAGNOSIS — R079 Chest pain, unspecified: Secondary | ICD-10-CM | POA: Insufficient documentation

## 2019-08-04 DIAGNOSIS — Z006 Encounter for examination for normal comparison and control in clinical research program: Secondary | ICD-10-CM

## 2019-08-04 DIAGNOSIS — E785 Hyperlipidemia, unspecified: Secondary | ICD-10-CM | POA: Diagnosis present

## 2019-08-04 MED ORDER — DILTIAZEM HCL 25 MG/5ML IV SOLN
INTRAVENOUS | Status: AC
  Administered 2019-08-04: 10 mg via INTRAVENOUS
  Administered 2019-08-04: 5 mg via INTRAVENOUS
  Filled 2019-08-04: qty 5

## 2019-08-04 MED ORDER — DILTIAZEM HCL 25 MG/5ML IV SOLN
5.0000 mg | Freq: Once | INTRAVENOUS | Status: AC
  Administered 2019-08-04: 5 mg via INTRAVENOUS
  Filled 2019-08-04: qty 5

## 2019-08-04 MED ORDER — NITROGLYCERIN 0.4 MG SL SUBL
SUBLINGUAL_TABLET | SUBLINGUAL | Status: AC
  Filled 2019-08-04: qty 2

## 2019-08-04 MED ORDER — DILTIAZEM HCL 25 MG/5ML IV SOLN
INTRAVENOUS | Status: AC
  Filled 2019-08-04: qty 5

## 2019-08-04 MED ORDER — METOPROLOL TARTRATE 5 MG/5ML IV SOLN
INTRAVENOUS | Status: AC
  Filled 2019-08-04: qty 15

## 2019-08-04 MED ORDER — IOHEXOL 350 MG/ML SOLN
80.0000 mL | Freq: Once | INTRAVENOUS | Status: AC | PRN
  Administered 2019-08-04: 80 mL via INTRAVENOUS

## 2019-08-04 MED ORDER — METOPROLOL TARTRATE 5 MG/5ML IV SOLN
5.0000 mg | Freq: Once | INTRAVENOUS | Status: AC
  Administered 2019-08-04: 5 mg via INTRAVENOUS

## 2019-08-04 MED ORDER — METOPROLOL TARTRATE 5 MG/5ML IV SOLN
10.0000 mg | Freq: Once | INTRAVENOUS | Status: AC
  Administered 2019-08-04: 10 mg via INTRAVENOUS

## 2019-08-04 MED ORDER — NITROGLYCERIN 0.4 MG SL SUBL: 0.8000 mg | SUBLINGUAL_TABLET | Freq: Once | SUBLINGUAL | Status: DC

## 2019-08-04 NOTE — Progress Notes (Signed)
Paged Dr. Marlou Porch in reference to elevated HR in the mid 70s depsite 15 mg of IV metoprolol and 10 mg of IV cardizem. Dr. Marlou Porch advises to give an additional 10 mg of cardizem and proceed to scan the patient regardless of HR. BP was obtained prior to medication administration

## 2019-08-04 NOTE — Research (Signed)
CADFEM Informed Consent                  Subject Name:   [name]. Seth   Subject met inclusion and exclusion criteria.  The informed consent form, study requirements and expectations were reviewed with the subject and questions and concerns were addressed prior to the signing of the consent form.  The subject verbalized understanding of the trial requirements.  The subject agreed to participate in the CADFEM trial and signed the informed consent.  The informed consent was obtained prior to performance of any protocol-specific procedures for the subject.  A copy of the signed informed consent was given to the subject and a copy was placed in the subject's medical record.   [name], Research Assistant  08/04/2019 09:25 a.m.

## 2019-08-05 ENCOUNTER — Telehealth: Payer: Self-pay

## 2019-08-05 NOTE — Telephone Encounter (Signed)
The patient has been notified of the CT result and verbalized understanding.  All questions (if any) were answered. Frederik Schmidt, RN 08/05/2019 1:27 PM

## 2019-08-05 NOTE — Telephone Encounter (Signed)
-----   Message from Jerline Pain, MD sent at 08/05/2019  1:24 PM EDT ----- Excellent CT of heart. No coronary artery disease.  Chest pain is non cardiac, likely musculoskeletal.  Candee Furbish, MD

## 2019-08-15 ENCOUNTER — Other Ambulatory Visit: Payer: Self-pay

## 2019-08-15 ENCOUNTER — Encounter (HOSPITAL_BASED_OUTPATIENT_CLINIC_OR_DEPARTMENT_OTHER): Payer: Self-pay | Admitting: Obstetrics & Gynecology

## 2019-08-15 NOTE — Progress Notes (Addendum)
Spoke with Janett Billow zanetto pa ok to proceed  Spoke w/ via phone for pre-op interview---patient Lab needs dos----  I stat 8            COVID test ------08-20-2019@1220  pm Arrive at -------1215 pm 08-23-2019 No food after midnight, clear liquids from midnight until 815 am then npo Medications to take morning of surgery -----albuterol inhaler prn/bring inhaler, gabapentin, venlafaxine, atorvastatin, flonase, omeprazole Diabetic medication -----none day of surgery Patient Special Instructions -----call dr Ihor Dow to see if need to stop 81 mg aspirin for surgery Pre-Op special Istructions -----none Patient verbalized understanding of instructions that were given at this phone interview. Patient denies shortness of breath, chest pain, fever, cough a this phone interview.  Anesthesia : chest pain April 2021, saw cardiology, dm Chart to jessica zanetto pa for review  [id number] henson np Cardiologist : dr Nita Sickle [phone] epic Chest x-ray :none Coronary ct 08-04-2019 epic EKG :07-12-2019 epic lov nephrology dr Cecille Rubin foster 08-17-2019 ckd stage 1 per note on chart Echo :none Cardiac Cath : n/a Sleep Study/ CPAP :none Fasting Blood Sugar : 120     / Checks Blood Sugar q day in am Blood Thinner/ Instructions /Last Dose:n/a ASA / Instructions/ Last Dose : patient to call dr Ihor Dow to see if needs to stop 81 mg aspirin for surgery  Patient denies shortness of breath, chest pain, fever, and cough at this phone interview.

## 2019-08-17 DIAGNOSIS — N181 Chronic kidney disease, stage 1: Secondary | ICD-10-CM | POA: Diagnosis not present

## 2019-08-17 DIAGNOSIS — K219 Gastro-esophageal reflux disease without esophagitis: Secondary | ICD-10-CM | POA: Diagnosis not present

## 2019-08-17 DIAGNOSIS — E1129 Type 2 diabetes mellitus with other diabetic kidney complication: Secondary | ICD-10-CM | POA: Diagnosis not present

## 2019-08-17 DIAGNOSIS — E1122 Type 2 diabetes mellitus with diabetic chronic kidney disease: Secondary | ICD-10-CM | POA: Diagnosis not present

## 2019-08-17 DIAGNOSIS — R809 Proteinuria, unspecified: Secondary | ICD-10-CM | POA: Diagnosis not present

## 2019-08-18 ENCOUNTER — Encounter (HOSPITAL_BASED_OUTPATIENT_CLINIC_OR_DEPARTMENT_OTHER): Payer: Self-pay | Admitting: Obstetrics & Gynecology

## 2019-08-19 ENCOUNTER — Other Ambulatory Visit (HOSPITAL_COMMUNITY): Payer: Medicare HMO

## 2019-08-19 NOTE — Progress Notes (Signed)
Anesthesia Chart Review   Case: [id number] Date/Time: 08/23/19 1400   Procedures:      DILATATION & CURETTAGE/HYSTEROSCOPY WITH NOVASURE ABLATION AND MYOMECTOMY WITH MYOSURE (N/A )     DILATATION & CURETTAGE/HYSTEROSCOPY WITH MYOSURE (N/A )   Anesthesia type: Choice   Pre-op diagnosis: PMPB   Location: [facility] OR ROOM .5 / [place]   Surgeons: Lavonia Drafts, MD      DISCUSSION:59 y.o. never smoker with h/o asthma, GERD, DM II, CKD Stage I, PMPB scheduled for above procedure 08/23/2019 with Dr. Lavonia Drafts.   Pt last seen by PCP 07/21/2019.  Pt with uncontrolled diabetes followed by PCP.  Last seen 07/21/19.  Per note blood sugars improving since starting Ozempic.    Pt underwent recent cardiac evaluation by Dr. Marlou Porch.  Coronary CT ordered. Per Dr. Marlou Porch, "Excellent CT of heart.  No coronary artery disease.  Chest pain is non cardiac, likely musculoskeletal."  Anticipate pt can proceed with planned procedure barring acute status change and after evaluation DOS, SDW. VS: Ht 5\' 7"  (1.702 m)   Wt 107 kg   LMP 06/23/2014   BMI 36.96 kg/m   PROVIDERS: Girtha Rm, NP-C is PCP   Candee Furbish, MD is Cardiologist  LABS: labs dos (all labs ordered are listed, but only abnormal results are displayed)  Labs Reviewed - No data to display   IMAGES:   EKG: 07/12/2019 Rate 106 bpm    CV: Stress Echo 04/04/2010 Rest:   - LV size was normal.  - The estimated LV ejection fraction was 55%.  - Normal wall motion; no LV regional wall motion abnormalities.  Peak stress:   - LV size was reduced and appropriately increased from the prior   stage.  - The estimated LV ejection fraction was 70%.  - No evidence for new LV regional wall motion abnormalities.    Stress echo results: Left ventricular ejection fraction was normal  at rest and with stress. There was no echocardiographic evidence for  stress-induced ischemia.   Past Medical  History:  Diagnosis Date  . Abdominal pain    due to fibroids  . Anxiety   . Arthritis   . Asthma    stress induced asthma  . Chest pain 06/2019  . Chlamydia   . Chronic kidney disease    ckd stage 1 per 5-26-201 dr Cecille Rubin [facility] note  . Depression   . DM type 2 (diabetes mellitus, type 2) ([place]) dx jan 2021  . Fibroid    uterine  . GERD (gastroesophageal reflux disease)   . Headache   . Hyperlipidemia   . IBS (irritable bowel syndrome)   . Liver nodule 1999   no follow up needed was due to stress  . Neuropathy    both fingers and toes at times  . Obesity   . Protein in urine 03/2019  . Trichomonas   . Tubular adenoma of colon 01/2012    Past Surgical History:  Procedure Laterality Date  . colonscopy  2017  . COLPOSCOPY  1990   results were normal  . IR GENERIC HISTORICAL  03/21/2016   IR EMBO TUMOR ORGAN ISCHEMIA INFARCT INC GUIDE ROADMAPPING 03/21/2016 Arne Cleveland, MD [facility]  . IR GENERIC HISTORICAL  03/21/2016   IR US GUIDE VASC ACCESS RIGHT 03/21/2016 Arne Cleveland, MD [facility]  . IR GENERIC HISTORICAL  03/21/2016   IR ANGIOGRAM SELECTIVE EACH ADDITIONAL VESSEL 03/21/2016 Arne Cleveland, MD [facility]  . IR GENERIC  HISTORICAL  03/21/2016   IR ANGIOGRAM SELECTIVE EACH ADDITIONAL VESSEL 03/21/2016 Arne Cleveland, MD [facility]  . IR GENERIC HISTORICAL  03/21/2016   IR ANGIOGRAM PELVIS SELECTIVE OR SUPRASELECTIVE 03/21/2016 Arne Cleveland, MD [facility]  . IR GENERIC HISTORICAL  03/21/2016   IR ANGIOGRAM PELVIS SELECTIVE OR SUPRASELECTIVE 03/21/2016 Arne Cleveland, MD [facility]  . IR GENERIC HISTORICAL  02/27/2016   IR RADIOLOGIST EVAL & MGMT 02/27/2016 Arne Cleveland, MD [facility]  . IR GENERIC HISTORICAL  04/03/2016   IR RADIOLOGIST EVAL & MGMT 04/03/2016 Arne Cleveland, MD [facility]  . IR RADIOLOGIST EVAL & MGMT  08/26/2016  . labial cyst removal    . TUBAL LIGATION     20 years ago    MEDICATIONS: No current  facility-administered medications for this encounter.   Marland Kitchen lisinopril (ZESTRIL) 2.5 MG tablet  . UNABLE TO FIND  . albuterol (VENTOLIN HFA) 108 (90 Base) MCG/ACT inhaler  . aspirin 81 MG tablet  . atorvastatin (LIPITOR) 10 MG tablet  . Cyanocobalamin (B-12 PO)  . diclofenac (VOLTAREN) 75 MG EC tablet  . diclofenac Sodium (VOLTAREN) 1 % GEL  . fluticasone (FLONASE) 50 MCG/ACT nasal spray  . gabapentin (NEURONTIN) 100 MG capsule  . Glucose Blood (BLOOD GLUCOSE TEST STRIPS) STRP  . hyoscyamine (LEVBID) 0.375 MG 12 hr tablet  . Lancets (ONETOUCH ULTRASOFT) lancets  . meclizine (ANTIVERT) 25 MG tablet  . Melatonin 5 MG CAPS  . metFORMIN (GLUCOPHAGE) 1000 MG tablet  . metoprolol tartrate (LOPRESSOR) 100 MG tablet  . Multiple Vitamin (MULTIVITAMIN) tablet  . omeprazole (PRILOSEC) 20 MG capsule  . ondansetron (ZOFRAN ODT) 4 MG disintegrating tablet  . Semaglutide,0.25 or 0.5MG /DOS, (OZEMPIC, 0.25 OR 0.5 MG/DOSE,) 2 MG/1.5ML SOPN  . venlafaxine XR (EFFEXOR-XR) 75 MG 24 hr capsule     Maia Plan [facility] Pre-Surgical Testing [phone] 08/19/19  11:43 AM

## 2019-08-20 ENCOUNTER — Other Ambulatory Visit (HOSPITAL_COMMUNITY): Payer: Medicare HMO

## 2019-08-20 DIAGNOSIS — Z20828 Contact with and (suspected) exposure to other viral communicable diseases: Secondary | ICD-10-CM | POA: Diagnosis not present

## 2019-08-20 DIAGNOSIS — Z03818 Encounter for observation for suspected exposure to other biological agents ruled out: Secondary | ICD-10-CM | POA: Diagnosis not present

## 2019-08-23 ENCOUNTER — Encounter (HOSPITAL_BASED_OUTPATIENT_CLINIC_OR_DEPARTMENT_OTHER)
Admission: RE | Disposition: A | Discharge status: Home / Self Care | Payer: Self-pay | Source: Home / Self Care | Attending: Obstetrics & Gynecology

## 2019-08-23 ENCOUNTER — Encounter (HOSPITAL_BASED_OUTPATIENT_CLINIC_OR_DEPARTMENT_OTHER): Payer: Self-pay | Admitting: Obstetrics & Gynecology

## 2019-08-23 ENCOUNTER — Ambulatory Visit (HOSPITAL_BASED_OUTPATIENT_CLINIC_OR_DEPARTMENT_OTHER): Payer: Medicare HMO | Admitting: Physician Assistant

## 2019-08-23 ENCOUNTER — Other Ambulatory Visit: Payer: Self-pay

## 2019-08-23 ENCOUNTER — Ambulatory Visit (HOSPITAL_BASED_OUTPATIENT_CLINIC_OR_DEPARTMENT_OTHER)
Admission: RE | Admit: 2019-08-23 | Discharge: 2019-08-23 | Disposition: A | Discharge status: Home / Self Care | Payer: Medicare HMO | Attending: Obstetrics & Gynecology | Admitting: Obstetrics & Gynecology

## 2019-08-23 DIAGNOSIS — N181 Chronic kidney disease, stage 1: Secondary | ICD-10-CM | POA: Insufficient documentation

## 2019-08-23 DIAGNOSIS — E1140 Type 2 diabetes mellitus with diabetic neuropathy, unspecified: Secondary | ICD-10-CM | POA: Diagnosis not present

## 2019-08-23 DIAGNOSIS — N840 Polyp of corpus uteri: Secondary | ICD-10-CM | POA: Insufficient documentation

## 2019-08-23 DIAGNOSIS — N950 Postmenopausal bleeding: Secondary | ICD-10-CM | POA: Diagnosis present

## 2019-08-23 DIAGNOSIS — D259 Leiomyoma of uterus, unspecified: Secondary | ICD-10-CM | POA: Diagnosis present

## 2019-08-23 DIAGNOSIS — E785 Hyperlipidemia, unspecified: Secondary | ICD-10-CM | POA: Insufficient documentation

## 2019-08-23 DIAGNOSIS — K589 Irritable bowel syndrome without diarrhea: Secondary | ICD-10-CM | POA: Diagnosis not present

## 2019-08-23 DIAGNOSIS — E669 Obesity, unspecified: Secondary | ICD-10-CM | POA: Insufficient documentation

## 2019-08-23 DIAGNOSIS — K219 Gastro-esophageal reflux disease without esophagitis: Secondary | ICD-10-CM | POA: Insufficient documentation

## 2019-08-23 DIAGNOSIS — N858 Other specified noninflammatory disorders of uterus: Secondary | ICD-10-CM | POA: Diagnosis not present

## 2019-08-23 DIAGNOSIS — M2392 Unspecified internal derangement of left knee: Secondary | ICD-10-CM

## 2019-08-23 DIAGNOSIS — M25562 Pain in left knee: Secondary | ICD-10-CM

## 2019-08-23 DIAGNOSIS — Z79899 Other long term (current) drug therapy: Secondary | ICD-10-CM | POA: Insufficient documentation

## 2019-08-23 DIAGNOSIS — F419 Anxiety disorder, unspecified: Secondary | ICD-10-CM | POA: Diagnosis not present

## 2019-08-23 DIAGNOSIS — J4520 Mild intermittent asthma, uncomplicated: Secondary | ICD-10-CM | POA: Diagnosis not present

## 2019-08-23 DIAGNOSIS — Z888 Allergy status to other drugs, medicaments and biological substances status: Secondary | ICD-10-CM | POA: Insufficient documentation

## 2019-08-23 DIAGNOSIS — E1122 Type 2 diabetes mellitus with diabetic chronic kidney disease: Secondary | ICD-10-CM | POA: Diagnosis not present

## 2019-08-23 DIAGNOSIS — Z7984 Long term (current) use of oral hypoglycemic drugs: Secondary | ICD-10-CM | POA: Insufficient documentation

## 2019-08-23 DIAGNOSIS — D219 Benign neoplasm of connective and other soft tissue, unspecified: Secondary | ICD-10-CM | POA: Diagnosis present

## 2019-08-23 DIAGNOSIS — D250 Submucous leiomyoma of uterus: Secondary | ICD-10-CM | POA: Insufficient documentation

## 2019-08-23 DIAGNOSIS — M1990 Unspecified osteoarthritis, unspecified site: Secondary | ICD-10-CM | POA: Insufficient documentation

## 2019-08-23 DIAGNOSIS — F319 Bipolar disorder, unspecified: Secondary | ICD-10-CM | POA: Insufficient documentation

## 2019-08-23 DIAGNOSIS — Z7982 Long term (current) use of aspirin: Secondary | ICD-10-CM | POA: Diagnosis not present

## 2019-08-23 DIAGNOSIS — Z20822 Contact with and (suspected) exposure to covid-19: Secondary | ICD-10-CM | POA: Insufficient documentation

## 2019-08-23 DIAGNOSIS — J45909 Unspecified asthma, uncomplicated: Secondary | ICD-10-CM | POA: Diagnosis not present

## 2019-08-23 HISTORY — DX: Chronic kidney disease, unspecified: N18.9

## 2019-08-23 HISTORY — PX: DILITATION & CURRETTAGE/HYSTROSCOPY WITH NOVASURE ABLATION: SHX5568

## 2019-08-23 HISTORY — DX: Polyneuropathy, unspecified: G62.9

## 2019-08-23 HISTORY — DX: Type 2 diabetes mellitus without complications: E11.9

## 2019-08-23 LAB — GLUCOSE, CAPILLARY
Glucose-Capillary: 98 mg/dL (ref 70–99)
Glucose-Capillary: 67 mg/dL — ABNORMAL LOW (ref 70–99)
Glucose-Capillary: 95 mg/dL (ref 70–99)

## 2019-08-23 LAB — CBC
HCT: 38.1 % (ref 36.0–46.0)
Hemoglobin: 11.9 g/dL — ABNORMAL LOW (ref 12.0–15.0)
MCH: 24.9 pg — ABNORMAL LOW (ref 26.0–34.0)
MCHC: 31.2 g/dL (ref 30.0–36.0)
MCV: 79.9 fL — ABNORMAL LOW (ref 80.0–100.0)
Platelets: 234 10*3/uL (ref 150–400)
RBC: 4.77 MIL/uL (ref 3.87–5.11)
RDW: 13.7 % (ref 11.5–15.5)
WBC: 5.3 10*3/uL (ref 4.0–10.5)
nRBC: 0 % (ref 0.0–0.2)

## 2019-08-23 LAB — SARS CORONAVIRUS 2 BY RT PCR (HOSPITAL ORDER, PERFORMED IN ~~LOC~~ HOSPITAL LAB): SARS Coronavirus 2: NEGATIVE

## 2019-08-23 SURGERY — DILATATION & CURETTAGE/HYSTEROSCOPY WITH NOVASURE ABLATION
Anesthesia: General | Site: Vagina

## 2019-08-23 MED ORDER — DICLOFENAC SODIUM 75 MG PO TBEC
75.0000 mg | DELAYED_RELEASE_TABLET | Freq: Two times a day (BID) | ORAL | 0 refills | Status: DC | PRN
Start: 2019-08-23 — End: 2020-01-10

## 2019-08-23 MED ORDER — PROPOFOL 10 MG/ML IV BOLUS
INTRAVENOUS | Status: AC
  Filled 2019-08-23: qty 20

## 2019-08-23 MED ORDER — LIDOCAINE 2% (20 MG/ML) 5 ML SYRINGE
INTRAMUSCULAR | Status: DC | PRN
  Administered 2019-08-23: 80 mg via INTRAVENOUS

## 2019-08-23 MED ORDER — LIDOCAINE 2% (20 MG/ML) 5 ML SYRINGE
INTRAMUSCULAR | Status: AC
  Filled 2019-08-23: qty 5

## 2019-08-23 MED ORDER — DEXAMETHASONE SODIUM PHOSPHATE 10 MG/ML IJ SOLN
INTRAMUSCULAR | Status: AC
  Filled 2019-08-23: qty 1

## 2019-08-23 MED ORDER — PROPOFOL 10 MG/ML IV BOLUS
INTRAVENOUS | Status: DC | PRN
  Administered 2019-08-23: 200 mg via INTRAVENOUS

## 2019-08-23 MED ORDER — SODIUM CHLORIDE 0.9 % IR SOLN
Status: DC | PRN
  Administered 2019-08-23: 3000 mL

## 2019-08-23 MED ORDER — MIDAZOLAM HCL 5 MG/5ML IJ SOLN
INTRAMUSCULAR | Status: DC | PRN
  Administered 2019-08-23: 2 mg via INTRAVENOUS

## 2019-08-23 MED ORDER — FENTANYL CITRATE (PF) 100 MCG/2ML IJ SOLN
INTRAMUSCULAR | Status: DC | PRN
  Administered 2019-08-23: 25 ug via INTRAVENOUS
  Administered 2019-08-23: 50 ug via INTRAVENOUS
  Administered 2019-08-23: 25 ug via INTRAVENOUS

## 2019-08-23 MED ORDER — BUPIVACAINE HCL (PF) 0.5 % IJ SOLN
INTRAMUSCULAR | Status: DC | PRN
  Administered 2019-08-23: 20 mL

## 2019-08-23 MED ORDER — SOD CITRATE-CITRIC ACID 500-334 MG/5ML PO SOLN: 30.0000 mL | ORAL | Status: DC

## 2019-08-23 MED ORDER — FENTANYL CITRATE (PF) 100 MCG/2ML IJ SOLN
INTRAMUSCULAR | Status: AC
  Filled 2019-08-23: qty 2

## 2019-08-23 MED ORDER — ONDANSETRON HCL 4 MG/2ML IJ SOLN
INTRAMUSCULAR | Status: AC
  Filled 2019-08-23: qty 2

## 2019-08-23 MED ORDER — ONDANSETRON HCL 4 MG/2ML IJ SOLN
INTRAMUSCULAR | Status: DC | PRN
  Administered 2019-08-23: 4 mg via INTRAVENOUS

## 2019-08-23 MED ORDER — LACTATED RINGERS IV SOLN: INTRAVENOUS | Status: DC

## 2019-08-23 MED ORDER — MIDAZOLAM HCL 2 MG/2ML IJ SOLN
INTRAMUSCULAR | Status: AC
  Filled 2019-08-23: qty 2

## 2019-08-23 MED ORDER — DEXAMETHASONE SODIUM PHOSPHATE 10 MG/ML IJ SOLN
INTRAMUSCULAR | Status: DC | PRN
  Administered 2019-08-23: 10 mg via INTRAVENOUS

## 2019-08-23 SURGICAL SUPPLY — 19 items
ABLATOR SURESOUND NOVASURE (ABLATOR) ×3 IMPLANT
CANISTER SUCT 3000ML PPV (MISCELLANEOUS) ×3 IMPLANT
CATH ROBINSON RED A/P 16FR (CATHETERS) ×3 IMPLANT
COVER WAND RF STERILE (DRAPES) ×3 IMPLANT
DEVICE MYOSURE LITE (MISCELLANEOUS) IMPLANT
DEVICE MYOSURE REACH (MISCELLANEOUS) IMPLANT
GAUZE 4X4 16PLY RFD (DISPOSABLE) ×3 IMPLANT
GLOVE BIO SURGEON STRL SZ7 (GLOVE) ×3 IMPLANT
GLOVE BIOGEL PI IND STRL 7.0 (GLOVE) ×2 IMPLANT
GLOVE BIOGEL PI INDICATOR 7.0 (GLOVE) ×1
GOWN STRL REUS W/TWL LRG LVL3 (GOWN DISPOSABLE) ×3 IMPLANT
KIT PROCEDURE FLUENT (KITS) ×3 IMPLANT
MYOSURE XL FIBROID (MISCELLANEOUS)
PACK VAGINAL MINOR WOMEN LF (CUSTOM PROCEDURE TRAY) ×3 IMPLANT
PAD OB MATERNITY 4.3X12.25 (PERSONAL CARE ITEMS) ×3 IMPLANT
SEAL CERVICAL OMNI LOK (ABLATOR) IMPLANT
SEAL ROD LENS SCOPE MYOSURE (ABLATOR) ×3 IMPLANT
SYSTEM TISS REMOVAL MYOSURE XL (MISCELLANEOUS) IMPLANT
TOWEL OR 17X26 10 PK STRL BLUE (TOWEL DISPOSABLE) ×3 IMPLANT

## 2019-08-23 NOTE — Anesthesia Preprocedure Evaluation (Addendum)
Anesthesia Evaluation  Patient identified by MRN, date of birth, ID band Patient awake    Reviewed: Allergy & Precautions, NPO status , Patient's Chart, lab work & pertinent test results  History of Anesthesia Complications Negative for: history of anesthetic complications  Airway Mallampati: II  TM Distance: >3 FB Neck ROM: Full    Dental   Pulmonary asthma ,    Pulmonary exam normal        Cardiovascular negative cardio ROS Normal cardiovascular exam     Neuro/Psych Anxiety Depression Bipolar Disorder negative neurological ROS     GI/Hepatic Neg liver ROS, GERD  ,  Endo/Other  diabetes, Poorly Controlled, Type 2, Oral Hypoglycemic Agents  Renal/GU Renal disease (CKD stage I)  negative genitourinary   Musculoskeletal  (+) Arthritis ,   Abdominal   Peds  Hematology negative hematology ROS (+)   Anesthesia Other Findings  Pt underwent recent cardiac evaluation by Dr. Marlou Porch.  Coronary CT ordered. Per Dr. Marlou Porch, "Excellent CT of heart.  No coronary artery disease.  Chest pain is non cardiac, likely musculoskeletal."  Normal stress echo 2012  Reproductive/Obstetrics Uterine fibroids                           Anesthesia Physical Anesthesia Plan  ASA: III  Anesthesia Plan: General   Post-op Pain Management:    Induction: Intravenous  PONV Risk Score and Plan: 3 and Ondansetron, Dexamethasone, Treatment may vary due to age or medical condition and Midazolam  Airway Management Planned: Oral ETT  Additional Equipment: None  Intra-op Plan:   Post-operative Plan: Extubation in OR  Informed Consent: I have reviewed the patients History and Physical, chart, labs and discussed the procedure including the risks, benefits and alternatives for the proposed anesthesia with the patient or authorized representative who has indicated his/her understanding and acceptance.     Dental advisory  given  Plan Discussed with:   Anesthesia Plan Comments:         Anesthesia Quick Evaluation

## 2019-08-23 NOTE — Discharge Instructions (Signed)
Hysteroscopy, Care After This sheet gives you information about how to care for yourself after your procedure. Your health care provider may also give you more specific instructions. If you have problems or questions, contact your health care provider. What can I expect after the procedure? After the procedure, it is common to have:  Cramping.  Bleeding. This can vary from light spotting to menstrual-like bleeding. Follow these instructions at home: Activity  Rest for 1-2 days after the procedure.  Do not douche, use tampons, or have sex for 2 weeks after the procedure, or until your health care provider approves.  Do not drive for 24 hours after the procedure, or for as long as told by your health care provider.  Do not drive, use heavy machinery, or drink alcohol while taking prescription pain medicines. Medicines   Take over-the-counter and prescription medicines only as told by your health care provider.  Do not take aspirin during recovery. It can increase the risk of bleeding. General instructions  Do not take baths, swim, or use a hot tub until your health care provider approves. Take showers instead of baths for 2 weeks, or for as long as told by your health care provider.  To prevent or treat constipation while you are taking prescription pain medicine, your health care provider may recommend that you: ? Drink enough fluid to keep your urine clear or pale yellow. ? Take over-the-counter or prescription medicines. ? Eat foods that are high in fiber, such as fresh fruits and vegetables, whole grains, and beans. ? Limit foods that are high in fat and processed sugars, such as fried and sweet foods.  Keep all follow-up visits as told by your health care provider. This is important. Contact a health care provider if:  You feel dizzy or lightheaded.  You feel nauseous.  You have abnormal vaginal discharge.  You have a rash.  You have pain that does not get better with  medicine.  You have chills. Get help right away if:  You have bleeding that is heavier than a normal menstrual period.  You have a fever.  You have pain or cramps that get worse.  You develop new abdominal pain.  You faint.  You have pain in your shoulders.  You have shortness of breath. Summary  After the procedure, you may have cramping and some vaginal bleeding.  Do not douche, use tampons, or have sex for 2 weeks after the procedure, or until your health care provider approves.  Do not take baths, swim, or use a hot tub until your health care provider approves. Take showers instead of baths for 2 weeks, or for as long as told by your health care provider.  Report any unusual symptoms to your health care provider.  Keep all follow-up visits as told by your health care provider. This is important. This information is not intended to replace advice given to you by your health care provider. Make sure you discuss any questions you have with your health care provider. Document Revised: 02/20/2017 Document Reviewed: 04/08/2016 Elsevier Patient Education  [place] Instructions  Activity: Get plenty of rest for the remainder of the day. A responsible adult should stay with you for 24 hours following the procedure.  For the next 24 hours, DO NOT: -Drive a car -Paediatric nurse -Drink alcoholic beverages -Take any medication unless instructed by your physician -Make any legal decisions or sign important papers.  Meals: Start with liquid foods  such as gelatin or soup. Progress to regular foods as tolerated. Avoid greasy, spicy, heavy foods. If nausea and/or vomiting occur, drink only clear liquids until the nausea and/or vomiting subsides. Call your physician if vomiting continues.  Special Instructions/Symptoms: Your throat may feel dry or sore from the anesthesia or the breathing tube placed in your throat during surgery. If this  causes discomfort, gargle with warm salt water. The discomfort should disappear within 24 hours.  If you had a scopolamine patch placed behind your ear for the management of post- operative nausea and/or vomiting:  1. The medication in the patch is effective for 72 hours, after which it should be removed.  Wrap patch in a tissue and discard in the trash. Wash hands thoroughly with soap and water. 2. You may remove the patch earlier than 72 hours if you experience unpleasant side effects which may include dry mouth, dizziness or visual disturbances. 3. Avoid touching the patch. Wash your hands with soap and water after contact with the patch.

## 2019-08-23 NOTE — Transfer of Care (Signed)
Immediate Anesthesia Transfer of Care Note  Patient: [name]  Procedure(s) Performed: DILATATION & CURETTAGE/DIAGNOSTIC HYSTEROSCOPY (N/A Vagina )  Patient Location: PACU  Anesthesia Type:General  Level of Consciousness: awake, alert  and oriented  Airway & Oxygen Therapy: Patient Spontanous Breathing and Patient connected to nasal cannula oxygen  Post-op Assessment: Report given to RN and Post -op Vital signs reviewed and stable  Post vital signs: Reviewed and stable  Last Vitals:  Vitals Value Taken Time  BP 133/100 08/23/19 1438  Temp    Pulse 89 08/23/19 1441  Resp 11 08/23/19 1441  SpO2 100 % 08/23/19 1441  Vitals shown include unvalidated device data.  Last Pain:  Vitals:   08/23/19 1158  TempSrc: Oral  PainSc: 0-No pain      Patients Stated Pain Goal: 4 ([id number])  Complications: No apparent anesthesia complications

## 2019-08-23 NOTE — Brief Op Note (Signed)
08/23/2019  2:41 PM  PATIENT:  [name]  59 y.o. female  PRE-OPERATIVE DIAGNOSIS:  PMPB  POST-OPERATIVE DIAGNOSIS:  PMPB  PROCEDURE:  Procedure(s): DILATATION & CURETTAGE/DIAGNOSTIC HYSTEROSCOPY (N/A)  SURGEON:  Surgeon(s) and Role:    * Lavonia Drafts, MD - Primary  ANESTHESIA:   general and paracervical block  EBL:  <10cc   BLOOD ADMINISTERED:none  DRAINS: none   LOCAL MEDICATIONS USED:  MARCAINE     SPECIMEN:  Source of Specimen:  endometrial currettings  DISPOSITION OF SPECIMEN:  PATHOLOGY  COUNTS:  YES  TOURNIQUET:  * No tourniquets in log *  DICTATION: .Note written in EPIC  PLAN OF CARE: Discharge to home after PACU  PATIENT DISPOSITION:  PACU - hemodynamically stable.   Delay start of Pharmacological VTE agent (>24hrs) due to surgical blood loss or risk of bleeding: yes  Complications: presumed uterine perforation.   Beryle Zeitz L. Harraway-Smith, M.D., Cherlynn June

## 2019-08-23 NOTE — H&P (Signed)
Preoperative History and Physical  [name] is a 59 y.o. [id number] here for surgical management of postmenopausal bleeding and fibroids. Pt has not had endometrial biopsy as she failed 2 attempts with 2 providers in the office and declined further attempts in the office. Her endometrial stripe was 85mm but, she continues to have bleeding.    Proposed surgery: hysterosocpy with D&C and myomectomy using Myosure and endometrial ablation using Novasure  Past Medical History:  Diagnosis Date  . Abdominal pain    due to fibroids  . Anxiety   . Arthritis   . Asthma    stress induced asthma  . Chest pain 06/2019  . Chlamydia   . Chronic kidney disease    ckd stage 1 per 5-26-201 dr Cecille Rubin foster ov note  . Depression   . DM type 2 (diabetes mellitus, type 2) ([place]) dx jan 2021  . Fibroid    uterine  . GERD (gastroesophageal reflux disease)   . Headache   . Hyperlipidemia   . IBS (irritable bowel syndrome)   . Liver nodule 1999   no follow up needed was due to stress  . Neuropathy    both fingers and toes at times  . Obesity   . Protein in urine 03/2019  . Trichomonas   . Tubular adenoma of colon 01/2012   Past Surgical History:  Procedure Laterality Date  . colonscopy  2017  . COLPOSCOPY  1990   results were normal  . IR GENERIC HISTORICAL  03/21/2016   IR EMBO TUMOR ORGAN ISCHEMIA INFARCT INC GUIDE ROADMAPPING 03/21/2016 Arne Cleveland, MD [facility]  . IR GENERIC HISTORICAL  03/21/2016   IR US GUIDE VASC ACCESS RIGHT 03/21/2016 Arne Cleveland, MD [facility]  . IR GENERIC HISTORICAL  03/21/2016   IR ANGIOGRAM SELECTIVE EACH ADDITIONAL VESSEL 03/21/2016 Arne Cleveland, MD [facility]  . IR GENERIC HISTORICAL  03/21/2016   IR ANGIOGRAM SELECTIVE EACH ADDITIONAL VESSEL 03/21/2016 Arne Cleveland, MD [facility]  . IR GENERIC HISTORICAL  03/21/2016   IR ANGIOGRAM PELVIS SELECTIVE OR SUPRASELECTIVE 03/21/2016 Arne Cleveland, MD [facility]  . IR GENERIC HISTORICAL   03/21/2016   IR ANGIOGRAM PELVIS SELECTIVE OR SUPRASELECTIVE 03/21/2016 Arne Cleveland, MD [facility]  . IR GENERIC HISTORICAL  02/27/2016   IR RADIOLOGIST EVAL & MGMT 02/27/2016 Arne Cleveland, MD [facility]  . IR GENERIC HISTORICAL  04/03/2016   IR RADIOLOGIST EVAL & MGMT 04/03/2016 Arne Cleveland, MD [facility]  . IR RADIOLOGIST EVAL & MGMT  08/26/2016  . labial cyst removal    . TUBAL LIGATION     20 years ago   OB History    Gravida  4   Para  2   Term  2   Preterm  0   AB  2   Living  2     SAB  2   TAB  0   Ectopic      Multiple      Live Births  2          Patient denies any cervical dysplasia or STIs. Medications Prior to Admission  Medication Sig Dispense Refill Last Dose  . lisinopril (ZESTRIL) 2.5 MG tablet Take 2.5 mg by mouth daily. Per dr Cecille Rubin foster 08-17-2019 ov note     . UNABLE TO FIND Med Name: vitamin b 12 500 mg daily     . albuterol (VENTOLIN HFA) 108 (90 Base) MCG/ACT inhaler INHALE 2 PUFFS INTO THE LUNGS EVERY  6 (SIX) HOURS AS NEEDED FOR WHEEZING. 18 g 0   . aspirin 81 MG tablet Take 81 mg by mouth daily.       Marland Kitchen atorvastatin (LIPITOR) 10 MG tablet TAKE 1 TABLET BY MOUTH EVERY DAY 90 tablet 0   . Cyanocobalamin (B-12 PO) Take by mouth.     . diclofenac (VOLTAREN) 75 MG EC tablet Take 1 tablet (75 mg total) by mouth 2 (two) times daily. (Patient taking differently: Take 75 mg by mouth 2 (two) times daily as needed. ) 30 tablet 0   . diclofenac Sodium (VOLTAREN) 1 % GEL APPLY 4 G TOPICALLY 4 (FOUR) TIMES DAILY. (Patient taking differently: Apply 4 g topically as needed. ) 200 g 1   . fluticasone (FLONASE) 50 MCG/ACT nasal spray SPRAY 2 SPRAYS INTO EACH NOSTRIL EVERY DAY 16 g 1   . gabapentin (NEURONTIN) 100 MG capsule 100 mg. 200mg  in am and 100mg  at night     . Glucose Blood (BLOOD GLUCOSE TEST STRIPS) STRP Test once a day. Pt has a onetouch verio flex meter dx e11.9 100 strip 1   . hyoscyamine (LEVBID) 0.375 MG 12 hr tablet TAKE  1 TABLET (0.375 MG TOTAL) BY MOUTH EVERY 12 (TWELVE) HOURS AS NEEDED. 20 tablet 1   . Lancets (ONETOUCH ULTRASOFT) lancets Test once a day. Dx E11.9 pt has onetouch verio flex meter 100 each 1   . meclizine (ANTIVERT) 25 MG tablet Take 1 tablet (25 mg total) by mouth 2 (two) times daily as needed for dizziness. 30 tablet 0   . Melatonin 5 MG CAPS Take 5 mg by mouth at bedtime as needed (sleep).      . metFORMIN (GLUCOPHAGE) 1000 MG tablet Take 1 tablet (1,000 mg total) by mouth 2 (two) times daily with a meal. 180 tablet 1   . metoprolol tartrate (LOPRESSOR) 100 MG tablet Take 1 tablet (100 mg total) by mouth once for 1 dose. Take (1) tablet 2 hours before your coronary CT 1 tablet 0   . Multiple Vitamin (MULTIVITAMIN) tablet Take 1 tablet daily by mouth.     Marland Kitchen omeprazole (PRILOSEC) 20 MG capsule Take 1 capsule (20 mg total) by mouth daily. 90 capsule 3   . ondansetron (ZOFRAN ODT) 4 MG disintegrating tablet Take 1 tablet (4 mg total) by mouth every 8 (eight) hours as needed for nausea or vomiting. 20 tablet 0   . Semaglutide,0.25 or 0.5MG /DOS, (OZEMPIC, 0.25 OR 0.5 MG/DOSE,) 2 MG/1.5ML SOPN Inject 0.5 mg into the skin once a week. (Patient taking differently: Inject 0.5 mg into the skin once a week. 0.25 weekly on thursday) 1 pen 4   . venlafaxine XR (EFFEXOR-XR) 75 MG 24 hr capsule TAKE 1 CAPSULE (75 MG TOTAL) BY MOUTH DAILY WITH BREAKFAST. (Patient taking differently: Take 150 mg by mouth 2 (two) times daily after a meal. ) 90 capsule 0     Allergies  Allergen Reactions  . Lamictal [Lamotrigine] Rash   Social History:   reports that she has never smoked. She has never used smokeless tobacco. She reports current alcohol use. She reports that she does not use drugs. Family History  Problem Relation Age of Onset  . Diabetes Father   . Diabetes Mother   . Hypertension Mother   . Deep vein thrombosis Mother   . Cancer - Other Mother        peritoneal cancer  . Hiatal hernia Mother   . Colon  cancer Maternal Grandmother   .  Colon polyps Maternal Grandmother   . Hypertension Sister   . Deep vein thrombosis Sister   . Diabetes Sister   . Hiatal hernia Sister   . Rectal cancer Neg Hx   . Stomach cancer Neg Hx   . Pancreatic cancer Neg Hx   . Kidney disease Neg Hx   . Liver disease Neg Hx     Review of Systems: Noncontributory  PHYSICAL EXAM: Blood pressure (!) 141/90, pulse (!) 102, temperature 98.5 F (36.9 C), temperature source Oral, resp. rate 17, height 5' 6.5" (1.689 m), weight 107.5 kg, last menstrual period 06/23/2014, SpO2 98 %. General appearance - alert, well appearing, and in no distress Chest - clear to auscultation, no wheezes, rales or rhonchi, symmetric air entry Heart - normal rate and regular rhythm Abdomen - soft, nontender, nondistended, no masses or organomegaly Pelvic - Small mobile. Exam limited by pts body habitus.  Extremities - peripheral pulses normal, no pedal edema, no clubbing or cyanosis  Labs: Results for orders placed or performed during the hospital encounter of 08/23/19 (from the past 336 hour(s))  SARS Coronavirus 2 by RT PCR (hospital order, performed in [facility] hospital lab) Nasopharyngeal Nasopharyngeal Swab   Collection Time: 08/23/19 10:21 AM   Specimen: Nasopharyngeal Swab  Result Value Ref Range   SARS Coronavirus 2 NEGATIVE NEGATIVE    Imaging Studies:Assessment:  11/2018 CLINICAL DATA:  Postmenopausal bleeding, single episode in April; past history of uterine fibroids and fibroid ablation  EXAM: TRANSABDOMINAL AND TRANSVAGINAL ULTRASOUND OF PELVIS  TECHNIQUE: Both transabdominal and transvaginal ultrasound examinations of the pelvis were performed. Transabdominal technique was performed for global imaging of the pelvis including uterus, ovaries, adnexal regions, and pelvic cul-de-sac. It was necessary to proceed with endovaginal exam following the transabdominal exam to visualize the endometrium and  ovaries.  COMPARISON:  12/09/2012  FINDINGS: Uterus  Measurements: 7.8 x 3.6 x 5.4 cm = volume: 81 mL. Anteverted. Heterogeneous myometrial echogenicity. Mass at fundus 3.0 x 3.5 x 2.9 cm in size containing hyperechoic foci likely calcifications. This mass likely extends submucosal. No additional masses.  Endometrium  Thickness: 4 mm.  No endometrial fluid or focal abnormality  Right ovary  Measurements: 2.9 x 1.4 x 1.2 cm = volume: 2.6 mL. Dominant follicle 11 mm greatest size without mass  Left ovary  Not visualized on either transabdominal or endovaginal imaging, likely obscured by bowel  Other findings  At free pelvic fluid or adnexal masses.  IMPRESSION: Probable submucosal leiomyoma at uterine fundus containing calcifications.  Nonvisualization of LEFT ovary.  Normal appearing endometrial complex 4 mm thick.  In the setting of post-menopausal bleeding, this is consistent with a benign etiology such as endometrial atrophy. If bleeding remains unresponsive to hormonal or medical therapy, sonohysterogram should be considered for focal lesion work-up. (Ref: Radiological Reasoning: Algorithmic Workup of Abnormal Vaginal Bleeding with Endovaginal Sonography and Sonohysterography. AJR [id number]) Patient Active Problem List   Diagnosis Date Noted  . Major depression, recurrent, chronic ([place]) 07/07/2019  . Abnormal EKG 07/07/2019  . Benign paroxysmal positional vertigo 06/23/2019  . Nausea 06/23/2019  . Type 2 diabetes mellitus with hyperglycemia, without long-term current use of insulin ([place]) 06/23/2019  . Sensation of fullness in right ear 06/23/2019  . Postmenopausal bleeding 12/07/2018  . Panic attacks 06/11/2018  . Mild intermittent asthma without complication [id number]  . Prediabetes 01/28/2017  . Obesity (BMI 30-39.9) 01/28/2017  . Adjustment disorder with anxiety 09/20/2016  . Uterine leiomyoma 03/21/2016  . Dyslipidemia  05/31/2012  .  VAGINAL PRURITUS 01/11/2010  . KNEE PAIN, LEFT 07/20/2009  . LYMPHADENOPATHY 07/20/2009  . INSOMNIA 02/06/2009  . ANEMIA, MILD 11/16/2008  . Gastroesophageal reflux disease 08/30/2008  . DIZZINESS 08/30/2008  . BIPOLAR DISORDER UNSPECIFIED 07/27/2007  . Anxiety and depression 10/29/2006  . DISORDER, DEPRESSIVE NEC 10/29/2006  . ALLERGIC RHINITIS 10/29/2006  . IRRITABLE BOWEL SYNDROME 10/29/2006  . HYPERCHOLESTEROLEMIA, MILD 09/01/2005  . DISORDER, LIVER NOS 08/02/2002    Plan: Patient will undergo surgical management with hysterosocpy with D&C and myomectomy using Myosure and endometrial ablation using Novasure.   The risks of surgery were discussed in detail with the patient including but not limited to: bleeding which may require transfusion or reoperation; infection which may require antibiotics; injury to surrounding organs which may involve bowel, bladder, ureters ; need for additional procedures including laparoscopy or laparotomy; thromboembolic phenomenon, surgical site problems and other postoperative/anesthesia complications. Likelihood of success in alleviating the patient's condition was discussed. Routine postoperative instructions will be reviewed with the patient and her family in detail after surgery.  The patient concurred with the proposed plan, giving informed written consent for the surgery.  Patient has been NPO since last night she will remain NPO for procedure.  Anesthesia and OR aware.  Preoperative prophylactic antibiotics and SCDs ordered on call to the OR.  To OR when ready.  Lavonya Hoerner L. Ihor Dow, M.D., [facility] 08/23/2019 12:06 PM

## 2019-08-23 NOTE — Anesthesia Procedure Notes (Signed)
Procedure Name: LMA Insertion Date/Time: 08/23/2019 2:08 PM Performed by: Dameian Crisman D, CRNA Pre-anesthesia Checklist: Patient identified, Emergency Drugs available, Suction available and Patient being monitored Patient Re-evaluated:Patient Re-evaluated prior to induction Oxygen Delivery Method: Circle system utilized Preoxygenation: Pre-oxygenation with 100% oxygen Induction Type: IV induction Ventilation: Mask ventilation without difficulty LMA: LMA inserted LMA Size: 4.0 Tube type: Oral Number of attempts: 1 Placement Confirmation: positive ETCO2 and breath sounds checked- equal and bilateral Tube secured with: Tape Dental Injury: Teeth and Oropharynx as per pre-operative assessment

## 2019-08-23 NOTE — Op Note (Addendum)
08/23/2019  2:41 PM  PATIENT:  [name]  59 y.o. female  PRE-OPERATIVE DIAGNOSIS:  PMPB  POST-OPERATIVE DIAGNOSIS:  PMPB  PROCEDURE:  Procedure(s): DILATATION & CURETTAGE/DIAGNOSTIC HYSTEROSCOPY (N/A)  SURGEON:  Surgeon(s) and Role:    * Lavonia Drafts, MD - Primary  ANESTHESIA:   general and paracervical block  EBL:  <10cc   BLOOD ADMINISTERED:none  DRAINS: none   LOCAL MEDICATIONS USED:  MARCAINE     SPECIMEN:  Source of Specimen:  endometrial currettings  DISPOSITION OF SPECIMEN:  PATHOLOGY  COUNTS:  YES  TOURNIQUET:  * No tourniquets in log *  DICTATION: .Note written in EPIC  PLAN OF CARE: Discharge to home after PACU  PATIENT DISPOSITION:  PACU - hemodynamically stable.   Delay start of Pharmacological VTE agent (>24hrs) due to surgical blood loss or risk of bleeding: yes  Complications: Increased fluid deficit. Presumed uterine perforation.  INDICATIONS: 59 y.o. [id number]  here for scheduled surgery for post menopausal bleeding. Risks of surgery were discussed with the patient including but not limited to: bleeding which may require transfusion; infection which may require antibiotics; injury to uterus or surrounding organs; intrauterine scarring which may impair future fertility; need for additional procedures including laparotomy or laparoscopy; and other postoperative/anesthesia complications. Written informed consent was obtained.    FINDINGS:  A 8 week size uterus. Stenotic os. Atrophic endometrium. Normal ostia bilaterally. Large posterior fibroid submucosal expanding over the posterior wall of the uterus.    PROCEDURE DETAILS:  The patient was taken to the operating room where general anesthesia was administered and was found to be adequate.  After an adequate timeout was performed, she was placed in the dorsal lithotomy position and examined; then prepped and draped in the sterile manner.   Her bladder was catheterized for an unmeasured  amount of clear, yellow urine. A speculum was then placed in the patient's vagina and a single tooth tenaculum was applied to the anterior lip of the cervix. A paracervical nerve block was performed using .5% Marcaine in the amount of 20cc total (10cc on each side).  The cervix was noted to be stenotic. The smallest Pratt dilator was used and ultimately the os opened and was dilated to accommodate a 5 mm diagnostic hysteroscope.  Once the cervix was dilated, the hysteroscope was inserted under direct visualization using 1.5% glycine as a suspension medium.  The uterine cavity was carefully examined, both ostia were recognized, and an atrophic endometrium was noted.   After further careful visualization of the uterine cavity, the hysteroscope was removed under direct visualization.  A sharp curettage was then performed to obtain a minimal amount of endometrial curettings.  The hysteroscope was reinserted, the fluid deficit was rapidly rising which was suspicious for a uterine perforation although no obvious perforation was noticed. There was a large posterior fibroid that extended the width of the uterus posteriorly. The tenaculum was removed from the anterior lip of the cervix and the vaginal speculum was removed after noting good hemostasis.  The patient tolerated the procedure well and was taken to the recovery area awake, extubated and in stable condition.  Give the width and size of the fibroid, if biopsy is benign and patient begins to have further bleeding, would recommend hysterectomy.    The patient will be discharged to home as per PACU criteria.  Routine postoperative instructions given.  She was prescribed Ibuprofen.  She will follow up in the clinic in 4 weeks for postoperative evaluation.  Marylu Dudenhoeffer L. Harraway-Smith,  M.D., [facility]

## 2019-08-24 LAB — SURGICAL PATHOLOGY

## 2019-08-25 NOTE — Anesthesia Postprocedure Evaluation (Signed)
Anesthesia Post Note  Patient: [name]  Procedure(s) Performed: DILATATION & CURETTAGE/DIAGNOSTIC HYSTEROSCOPY (N/A Vagina )     Patient location during evaluation: PACU Anesthesia Type: General Level of consciousness: awake and alert Pain management: pain level controlled Vital Signs Assessment: post-procedure vital signs reviewed and stable Respiratory status: spontaneous breathing, nonlabored ventilation, respiratory function stable and patient connected to nasal cannula oxygen Cardiovascular status: blood pressure returned to baseline and stable Postop Assessment: no apparent nausea or vomiting Anesthetic complications: no    Last Vitals:  Vitals:   08/23/19 1515 08/23/19 1544  BP: 136/87 (!) 154/90  Pulse: 91 95  Resp: (!) 24 16  Temp:  36.9 C  SpO2: 97% 96%    Last Pain:  Vitals:   08/24/19 1023  TempSrc:   PainSc: 2                  [facility] S

## 2019-09-05 DIAGNOSIS — E1129 Type 2 diabetes mellitus with other diabetic kidney complication: Secondary | ICD-10-CM | POA: Diagnosis not present

## 2019-09-09 ENCOUNTER — Encounter: Payer: Self-pay | Admitting: Family Medicine

## 2019-09-19 ENCOUNTER — Encounter: Payer: Self-pay | Admitting: Obstetrics & Gynecology

## 2019-09-19 ENCOUNTER — Other Ambulatory Visit: Payer: Self-pay

## 2019-09-19 ENCOUNTER — Ambulatory Visit (INDEPENDENT_AMBULATORY_CARE_PROVIDER_SITE_OTHER): Payer: Medicare HMO | Admitting: Obstetrics & Gynecology

## 2019-09-19 VITALS — BP 139/85 | HR 109 | Ht 68.0 in | Wt 236.0 lb

## 2019-09-19 DIAGNOSIS — N939 Abnormal uterine and vaginal bleeding, unspecified: Secondary | ICD-10-CM

## 2019-09-19 DIAGNOSIS — Z9889 Other specified postprocedural states: Secondary | ICD-10-CM

## 2019-09-19 NOTE — Patient Instructions (Signed)
Total Laparoscopic Hysterectomy °A total laparoscopic hysterectomy is a minimally invasive surgery to remove the uterus and cervix. The fallopian tubes and ovaries can also be removed (bilateral salpingo-oophorectomy) during this surgery, if necessary. This procedure may be done to treat problems such as: °· Noncancerous growths in the uterus (uterine fibroids) that cause symptoms. °· A condition that causes the lining of the uterus (endometrium) to grow in other areas (endometriosis). °· Problems with pelvic support. This is caused by weakened muscles of the pelvis following vaginal childbirth or menopause. °· Cancer of the cervix, ovaries, uterus, or endometrium. °· Excessive (dysfunctional) uterine bleeding. °This surgery is performed by inserting a thin, lighted tube (laparoscope) and surgical instruments into small incisions in the abdomen. The laparoscope sends images to a monitor. The images help the health care provider perform the procedure. After this procedure, you will no longer be able to have a baby, and you will no longer have a menstrual period. °Tell a health care provider about: °· Any allergies you have. °· All medicines you are taking, including vitamins, herbs, eye drops, creams, and over-the-counter medicines. °· Any problems you or family members have had with anesthetic medicines. °· Any blood disorders you have. °· Any surgeries you have had. °· Any medical conditions you have. °· Whether you are pregnant or may be pregnant. °What are the risks? °Generally, this is a safe procedure. However, problems may occur, including: °· Infection. °· Bleeding. °· Blood clots in the legs or lungs. °· Allergic reactions to medicines. °· Damage to other structures or organs. °· The risk that the surgery may have to be switched to the regular one in which a large incision is made in the abdomen (abdominal hysterectomy). °What happens before the procedure? °Staying hydrated °Follow instructions from your  health care provider about hydration, which may include: °· Up to 2 hours before the procedure - you may continue to drink clear liquids, such as water, clear fruit juice, black coffee, and plain tea °Eating and drinking restrictions °Follow instructions from your health care provider about eating and drinking, which may include: °· 8 hours before the procedure - stop eating heavy meals or foods such as meat, fried foods, or fatty foods. °· 6 hours before the procedure - stop eating light meals or foods, such as toast or cereal. °· 6 hours before the procedure - stop drinking milk or drinks that contain milk. °· 2 hours before the procedure - stop drinking clear liquids. °Medicines °· Ask your health care provider about: °? Changing or stopping your regular medicines. This is especially important if you are taking diabetes medicines or blood thinners. °? Taking over-the-counter medicines, vitamins, herbs, and supplements. °? Taking medicines such as aspirin and ibuprofen. These medicines can thin your blood. Do not take these medicines unless your health care provider tells you to take them. °· You may be given antibiotic medicine to help prevent infection. °· You may be asked to take laxatives. °· You may be given medicines to help prevent nausea and vomiting after the procedure. °General instructions °· Ask your health care provider how your surgical site will be marked or identified. °· You may be asked to shower with a germ-killing soap. °· Do not use any products that contain nicotine or tobacco, such as cigarettes and e-cigarettes. If you need help quitting, ask your health care provider. °· You may have an exam or testing, such as an ultrasound to determine the size and shape of your pelvic organs. °·   You may have a blood or urine sample taken. °· This procedure can affect the way you feel about yourself. Talk with your health care provider about the physical and emotional changes hysterectomy may  cause. °· Plan to have someone take you home from the hospital or clinic. °· Plan to have a responsible adult care for you for at least 24 hours after you leave the hospital or clinic. This is important. °What happens during the procedure? °· To lower your risk of infection: °? Your health care team will wash or sanitize their hands. °? Your skin will be washed with soap. °? Hair may be removed from the surgical area. °· An IV will be inserted into one of your veins. °· You will be given one or more of the following: °? A medicine to help you relax (sedative). °? A medicine to make you fall asleep (general anesthetic). °· You will be given antibiotic medicine through your IV. °· A tube may be inserted down your throat to help you breathe during the procedure. °· A gas (carbon dioxide) will be used to inflate your abdomen to allow your surgeon to see inside of your abdomen. °· Three or four small incisions will be made in your abdomen. °· A laparoscope will be inserted into one of your incisions. Surgical instruments will be inserted through the other incisions in order to perform the procedure. °· Your uterus and cervix may be removed through your vagina or cut into small pieces and removed through the small incisions. Any other organs that need to be removed will also be removed this way. °· Carbon dioxide will be released from inside of your abdomen. °· Your incisions will be closed with stitches (sutures). °· A bandage (dressing) may be placed over your incisions. °The procedure may vary among health care providers and hospitals. °What happens after the procedure? °· Your blood pressure, heart rate, breathing rate, and blood oxygen level will be monitored until the medicines you were given have worn off. °· You will be given medicine for pain and nausea as needed. °· Do not drive for 24 hours if you received a sedative. °Summary °· Total Laparoscopic hysterectomy is a procedure to remove your uterus, cervix and  sometimes the fallopian tubes and ovaries. °· This procedure can affect the way you feel about yourself. Talk with your health care provider about the physical and emotional changes hysterectomy may cause. °· After this procedure, you will no longer be able to have a baby, and you will no longer have a menstrual period. °· You will be given pain medicine to control discomfort after this procedure. °This information is not intended to replace advice given to you by your health care provider. Make sure you discuss any questions you have with your health care provider. °Document Revised: 02/20/2017 Document Reviewed: 05/21/2016 °Elsevier Patient Education © 2020 [employer] °Total Laparoscopic Hysterectomy, Care After °This sheet gives you information about how to care for yourself after your procedure. Your health care provider may also give you more specific instructions. If you have problems or questions, contact your health care provider. °What can I expect after the procedure? °After the procedure, it is common to have: °· Pain and bruising around your incisions. °· A sore throat, if a breathing tube was used during surgery. °· Fatigue. °· Poor appetite. °· Less interest in sex. °If your ovaries were also removed, it is also common to have symptoms of menopause such as hot flashes, night sweats,   and lack of sleep (insomnia). °Follow these instructions at home: °Bathing °· Do not take baths, swim, or use a hot tub until your health care provider approves. You may need to only take showers for 2-3 weeks. °· Keep your bandage (dressing) dry until your health care provider says it can be removed. °Incision care ° °· Follow instructions from your health care provider about how to take care of your incisions. Make sure you: °? Wash your hands with soap and water before you change your dressing. If soap and water are not available, use hand sanitizer. °? Change your dressing as told by your health care  provider. °? Leave stitches (sutures), skin glue, or adhesive strips in place. These skin closures may need to stay in place for 2 weeks or longer. If adhesive strip edges start to loosen and curl up, you may trim the loose edges. Do not remove adhesive strips completely unless your health care provider tells you to do that. °· Check your incision area every day for signs of infection. Check for: °? Redness, swelling, or pain. °? Fluid or blood. °? Warmth. °? Pus or a bad smell. °Activity °· Get plenty of rest and sleep. °· Do not lift anything that is heavier than 10 lbs (4.5 kg) for one month after surgery, or as long as told by your health care provider. °· Do not drive or use heavy machinery while taking prescription pain medicine. °· Do not drive for 24 hours if you were given a medicine to help you relax (sedative). °· Return to your normal activities as told by your health care provider. Ask your health care provider what activities are safe for you. °Lifestyle ° °· Do not use any products that contain nicotine or tobacco, such as cigarettes and e-cigarettes. These can delay healing. If you need help quitting, ask your health care provider. °· Do not drink alcohol until your health care provider approves. °General instructions °· Do not douche, use tampons, or have sex for at least 6 weeks, or as told by your health care provider. °· Take over-the-counter and prescription medicines only as told by your health care provider. °· To monitor yourself for a fever, take your temperature at least once a day during recovery. °· If you struggle with physical or emotional changes after your procedure, speak with your health care provider or a therapist. °· To prevent or treat constipation while you are taking prescription pain medicine, your health care provider may recommend that you: °? Drink enough fluid to keep your urine clear or pale yellow. °? Take over-the-counter or prescription medicines. °? Eat foods that  are high in fiber, such as fresh fruits and vegetables, whole grains, and beans. °? Limit foods that are high in fat and processed sugars, such as fried and sweet foods. °· Keep all follow-up visits as told by your health care provider. This is important. °Contact a health care provider if: °· You have chills or a fever. °· You have redness, swelling, or pain around an incision. °· You have fluid or blood coming from an incision. °· Your incision feels warm to the touch. °· You have pus or a bad smell coming from an incision. °· An incision breaks open. °· You feel dizzy or light-headed. °· You have pain or bleeding when you urinate. °· You have diarrhea, nausea, or vomiting that does not go away. °· You have abnormal vaginal discharge. °· You have a rash. °· You have pain that does   not get better with medicine. °Get help right away if: °· You have a fever and your symptoms suddenly get worse. °· You have severe abdominal pain. °· You have chest pain. °· You have shortness of breath. °· You faint. °· You have pain, swelling, or redness on your leg. °· You have heavy vaginal bleeding with blood clots. °Summary °· After the procedure it is common to have abdominal pain. Your provider will give you medication for this. °· Do not take baths, swim, or use a hot tub until your health care provider approves. °· Do not lift anything that is heavier than 10 lbs (4.5 kg) for one month after surgery, or as long as told by your health care provider. °· Notify your provider if you have any signs or symptoms of infection after the procedure. °This information is not intended to replace advice given to you by your health care provider. Make sure you discuss any questions you have with your health care provider. °Document Revised: 02/20/2017 Document Reviewed: 05/21/2016 °Elsevier Patient Education © 2020 [employer] ° °

## 2019-09-19 NOTE — Progress Notes (Signed)
History:  59 y.o.LMP here today for 2 week post op check.Pt is s/p hysteroscopy w/ D&C 08/23/2019.  Large posterior fibroid noted. Not resected due to size and location. Pt reports that she is doing well. She is eating and passing stols without difficulty.   She is continuing to have pelvic cramping which she has had for years and back pain which she now believes in related to the fibroids. She denies new pain.   Pt reports that her mother had primary peritoneal cancer.   Pt reports that she has mood changes which are not completely improved with the Effexor. She was prev on Remeron prescribed by her primary care provider. She stooped it due to side effects.       The following portions of the patient's history were reviewed and updated as appropriate: allergies, current medications, past family history, past medical history, past social history, past surgical history and problem list.  Review of Systems:  Pertinent items are noted in HPI.    Objective:  Physical Exam BP 139/85   Pulse (!) 109   Ht 5\' 8"  (1.727 m)   Wt 236 lb (107 kg)   LMP 06/23/2014   BMI 35.88 kg/m   CONSTITUTIONAL: Well-developed, well-nourished female in no acute distress.  HENT:  Normocephalic, atraumatic EYES: Conjunctivae and EOM are normal. No scleral icterus.  NECK: Normal range of motion SKIN: Skin is warm and dry. No rash noted. Not diaphoretic.No pallor. [place]: Alert and oriented to person, place, and time. Normal coordination.    Labs and Imaging Surg path 08/23/2019  ENDOMETRIUM, CURETTAGE:  - Endometrioid type polyp(s).  - Atrophic appearing endometrium.  - There is no evidence of hyperplasia or malignancy  Assessment & Plan:  4 week post op check following  s/p hysteroscopy w/ D&C 08/23/2019. Pt still with pelvic cramping and back pain.  Sx uterine fibroids and post menopausal pt with maternal h/o primary peritoneal cancer. Pt is interested in definitive surgery. Rec hysterectomy with BSO.  Reviewed with pt the options for surgery. She will think about it and send me a note or call. o   Reviewed her surg path.   Reviewed post op instructions and activities  Gradual increase in activities  F/u in 3 months or sooner prn  All questions answered.   Jaquavius Hudler L. Harraway-Smith, M.D., [place]lynn June

## 2019-09-28 ENCOUNTER — Encounter: Payer: Medicare HMO | Admitting: Obstetrics & Gynecology

## 2019-09-28 ENCOUNTER — Other Ambulatory Visit: Payer: Self-pay | Admitting: Family Medicine

## 2019-09-28 DIAGNOSIS — E119 Type 2 diabetes mellitus without complications: Secondary | ICD-10-CM

## 2019-09-28 DIAGNOSIS — E7800 Pure hypercholesterolemia, unspecified: Secondary | ICD-10-CM

## 2019-11-24 ENCOUNTER — Other Ambulatory Visit: Payer: Self-pay

## 2019-11-24 ENCOUNTER — Encounter: Payer: Self-pay | Admitting: Family Medicine

## 2019-11-24 ENCOUNTER — Ambulatory Visit (INDEPENDENT_AMBULATORY_CARE_PROVIDER_SITE_OTHER): Payer: Medicare HMO | Admitting: Family Medicine

## 2019-11-24 VITALS — BP 120/74 | HR 78 | Temp 98.3°F | Wt 235.0 lb

## 2019-11-24 DIAGNOSIS — K219 Gastro-esophageal reflux disease without esophagitis: Secondary | ICD-10-CM

## 2019-11-24 DIAGNOSIS — R112 Nausea with vomiting, unspecified: Secondary | ICD-10-CM | POA: Diagnosis not present

## 2019-11-24 DIAGNOSIS — E1165 Type 2 diabetes mellitus with hyperglycemia: Secondary | ICD-10-CM

## 2019-11-24 DIAGNOSIS — R599 Enlarged lymph nodes, unspecified: Secondary | ICD-10-CM

## 2019-11-24 DIAGNOSIS — R1013 Epigastric pain: Secondary | ICD-10-CM | POA: Diagnosis not present

## 2019-11-24 DIAGNOSIS — Z8719 Personal history of other diseases of the digestive system: Secondary | ICD-10-CM | POA: Diagnosis not present

## 2019-11-24 LAB — HEMOGLOBIN A1C
Est. average glucose Bld gHb Est-mCnc: 146 mg/dL
Hgb A1c MFr Bld: 6.7 % — ABNORMAL HIGH (ref 4.8–5.6)

## 2019-11-24 NOTE — Progress Notes (Signed)
Subjective:    Patient ID: [name], female    DOB: 10/21/1960, 59 y.o.   MRN: [id number]  HPI Chief Complaint  Patient presents with   lump    lump behind ear. feels like a pain on right side x a year, knot in stomach and getting nausated when eating x 6 months   Hx of GERD and IBS.  Complains of nausea after eating but also on an empty stomach for the past 2 months. Upper abdominal pain "burning" at times. She also complains of a "knot" in her upper stomach. Occasionally tender.  States she has been vomiting once weekly for the past 2 months. States emesis looks like undigested food and mucus.   States she had an EDG with "erosion" in 2004. States Dr. Fuller Plan is her GI.   States she is taking omeprazole 20 mg once daily and Rolaids.  Denies taking NSAIDs She is drinking 3 glasses of wine weekly.   States she has 4-6 bowel movements per day. Occasional loose stool and hard stools other times. She has been seeing mucous in her stool. No melena.  She had a colonoscopy with Dr. Fuller Plan in May 2019.   Denies fever, chills, night sweats, unexplained weight loss, dizziness, chest pain, palpitations, shortness of breath.   She also has complaints of a "knot" behind her right jaw and below her right ear for the past year or so, since a dental procedure. Has not gotten any larger. States this is often tender and pain shoots up and down the right side of her neck. States her ear occasionally pops.  She canceled her dental appt but will schedule to discuss this.    Diabetes- states her blood sugars have improved. No readings over 200 or low readings.    She is seeing her OB/GYN for large uterine fibroid. States she has been told she needs a hysterectomy.    Review of Systems Pertinent positives and negatives in the history of present illness.     Objective:   Physical Exam Constitutional:      General: She is not in acute distress.    Appearance: Normal appearance. She is  not ill-appearing.  Eyes:     Conjunctiva/sclera: Conjunctivae normal.  Cardiovascular:     Rate and Rhythm: Normal rate and regular rhythm.     Pulses: Normal pulses.     Heart sounds: Normal heart sounds.  Pulmonary:     Effort: Pulmonary effort is normal.     Breath sounds: Normal breath sounds.  Abdominal:     General: Abdomen is flat. Bowel sounds are increased. There is no distension.     Palpations: Abdomen is soft.     Tenderness: There is abdominal tenderness in the epigastric area. There is no right CVA tenderness, left CVA tenderness, guarding or rebound. Negative signs include Murphy's sign and McBurney's sign.  Musculoskeletal:     Cervical back: Normal range of motion and neck supple. No tenderness.  Lymphadenopathy:     Cervical: No cervical adenopathy.     Upper Body:     Right upper body: No supraclavicular adenopathy.     Left upper body: No supraclavicular adenopathy.  Skin:    General: Skin is warm and dry.     Capillary Refill: Capillary refill takes less than 2 seconds.  Neurological:     Mental Status: She is alert.    BP 120/74    Pulse 78    Temp 98.3 F (36.8 C)  Wt 235 lb (106.6 kg)    LMP 06/23/2014    BMI 35.73 kg/m       Assessment & Plan:  Epigastric pain - Plan: US Abdomen Complete, CBC with Differential/Platelet, Comprehensive metabolic panel, Ambulatory referral to Gastroenterology, Lipase, Amylase -discussed possible etiologies including GERD. Will check pancreatic enzymes and other labs and she will follow up with GI. I will also order an [name] of her abdomen due to pain and tenderness.   Non-intractable vomiting with nausea, unspecified vomiting type - Plan: US Abdomen Complete, Ambulatory referral to Gastroenterology, Lipase, Amylase -recommend avoiding any triggers. Vomiting is not frequent. Will check labs and refer to GI  History of IBS - Plan: Ambulatory referral to Gastroenterology  Enlarged lymph nodes - Plan: CBC with  Differential/Platelet, Comprehensive metabolic panel -discussed the mass on the right side of her neck may be an enlarged lymph node but it does not appear to be enlarging. She will follow up with me and her dentist regarding this since the area has been present post dental work  Gastroesophageal reflux disease, unspecified whether esophagitis present - Plan: Ambulatory referral to Gastroenterology -continue PPI. Avoid triggers and follow up with GI  Type 2 diabetes mellitus with hyperglycemia, without long-term current use of insulin ([place]) - Plan: Hemoglobin A1c -reportedly BS at home are controlled. Check A1c and follow up

## 2019-11-25 LAB — CBC WITH DIFFERENTIAL/PLATELET
Basophils Absolute: 0 10*3/uL (ref 0.0–0.2)
Basos: 1 %
EOS (ABSOLUTE): 0.1 10*3/uL (ref 0.0–0.4)
Eos: 2 %
Hematocrit: 38.8 % (ref 34.0–46.6)
Hemoglobin: 12.9 g/dL (ref 11.1–15.9)
Immature Grans (Abs): 0 10*3/uL (ref 0.0–0.1)
Immature Granulocytes: 0 %
Lymphocytes Absolute: 2.7 10*3/uL (ref 0.7–3.1)
Lymphs: 45 %
MCH: 25.3 pg — ABNORMAL LOW (ref 26.6–33.0)
MCHC: 33.2 g/dL (ref 31.5–35.7)
MCV: 76 fL — ABNORMAL LOW (ref 79–97)
Monocytes Absolute: 0.4 10*3/uL (ref 0.1–0.9)
Monocytes: 7 %
Neutrophils Absolute: 2.7 10*3/uL (ref 1.4–7.0)
Neutrophils: 45 %
Platelets: 254 10*3/uL (ref 150–450)
RBC: 5.09 x10E6/uL (ref 3.77–5.28)
RDW: 14.2 % (ref 11.7–15.4)
WBC: 5.9 10*3/uL (ref 3.4–10.8)

## 2019-11-25 LAB — COMPREHENSIVE METABOLIC PANEL
ALT: 16 IU/L (ref 0–32)
AST: 16 IU/L (ref 0–40)
Albumin/Globulin Ratio: 1.7 (ref 1.2–2.2)
Albumin: 4.5 g/dL (ref 3.8–4.9)
Alkaline Phosphatase: 87 IU/L (ref 48–121)
BUN/Creatinine Ratio: 12 (ref 9–23)
BUN: 9 mg/dL (ref 6–24)
Bilirubin Total: 0.2 mg/dL (ref 0.0–1.2)
CO2: 21 mmol/L (ref 20–29)
Calcium: 9.7 mg/dL (ref 8.7–10.2)
Chloride: 106 mmol/L (ref 96–106)
Creatinine, Ser: 0.77 mg/dL (ref 0.57–1.00)
GFR calc Af Amer: 98 mL/min/{1.73_m2} (ref >59)
GFR calc non Af Amer: 85 mL/min/{1.73_m2} (ref >59)
Globulin, Total: 2.6 g/dL (ref 1.5–4.5)
Glucose: 132 mg/dL — ABNORMAL HIGH (ref 65–99)
Potassium: 4.2 mmol/L (ref 3.5–5.2)
Sodium: 144 mmol/L (ref 134–144)
Total Protein: 7.1 g/dL (ref 6.0–8.5)

## 2019-11-25 LAB — AMYLASE: Amylase: 68 U/L (ref 31–110)

## 2019-11-25 LAB — LIPASE: Lipase: 59 U/L (ref 14–72)

## 2019-11-30 ENCOUNTER — Other Ambulatory Visit: Payer: Self-pay | Admitting: Family Medicine

## 2019-12-01 ENCOUNTER — Other Ambulatory Visit: Payer: Medicare HMO

## 2019-12-01 NOTE — Telephone Encounter (Signed)
Pt will need to follow up with gI. Kh

## 2019-12-05 ENCOUNTER — Encounter: Payer: Self-pay | Admitting: Family Medicine

## 2019-12-05 ENCOUNTER — Ambulatory Visit
Admission: RE | Admit: 2019-12-05 | Discharge: 2019-12-05 | Disposition: A | Discharge status: Home / Self Care | Payer: Medicare HMO | Source: Ambulatory Visit | Attending: Family Medicine | Admitting: Family Medicine

## 2019-12-05 DIAGNOSIS — R1013 Epigastric pain: Secondary | ICD-10-CM | POA: Diagnosis not present

## 2019-12-05 DIAGNOSIS — R112 Nausea with vomiting, unspecified: Secondary | ICD-10-CM

## 2019-12-05 DIAGNOSIS — K7689 Other specified diseases of liver: Secondary | ICD-10-CM | POA: Diagnosis not present

## 2019-12-05 DIAGNOSIS — R10816 Epigastric abdominal tenderness: Secondary | ICD-10-CM | POA: Diagnosis not present

## 2019-12-05 DIAGNOSIS — K760 Fatty (change of) liver, not elsewhere classified: Secondary | ICD-10-CM

## 2019-12-05 HISTORY — DX: Fatty (change of) liver, not elsewhere classified: K76.0

## 2019-12-12 ENCOUNTER — Encounter: Payer: Self-pay | Admitting: Internal Medicine

## 2019-12-13 ENCOUNTER — Encounter: Payer: Self-pay | Admitting: Family Medicine

## 2019-12-13 ENCOUNTER — Other Ambulatory Visit: Payer: Self-pay | Admitting: Family Medicine

## 2019-12-13 DIAGNOSIS — R110 Nausea: Secondary | ICD-10-CM

## 2019-12-13 MED ORDER — ONDANSETRON 4 MG PO TBDP: 4.0000 mg | ORAL_TABLET | Freq: Three times a day (TID) | ORAL | 0 refills | Status: DC | PRN

## 2019-12-15 DIAGNOSIS — F330 Major depressive disorder, recurrent, mild: Secondary | ICD-10-CM | POA: Diagnosis not present

## 2019-12-15 DIAGNOSIS — R69 Illness, unspecified: Secondary | ICD-10-CM | POA: Diagnosis not present

## 2019-12-15 DIAGNOSIS — F4312 Post-traumatic stress disorder, chronic: Secondary | ICD-10-CM | POA: Diagnosis not present

## 2020-01-10 ENCOUNTER — Encounter: Payer: Self-pay | Admitting: Physician Assistant

## 2020-01-10 ENCOUNTER — Other Ambulatory Visit: Payer: Self-pay | Admitting: Family Medicine

## 2020-01-10 ENCOUNTER — Ambulatory Visit: Payer: Medicare HMO | Admitting: Physician Assistant

## 2020-01-10 VITALS — BP 118/78 | HR 110 | Ht 67.0 in | Wt 234.0 lb

## 2020-01-10 DIAGNOSIS — R109 Unspecified abdominal pain: Secondary | ICD-10-CM

## 2020-01-10 DIAGNOSIS — R110 Nausea: Secondary | ICD-10-CM

## 2020-01-10 DIAGNOSIS — K760 Fatty (change of) liver, not elsewhere classified: Secondary | ICD-10-CM

## 2020-01-10 DIAGNOSIS — K582 Mixed irritable bowel syndrome: Secondary | ICD-10-CM | POA: Diagnosis not present

## 2020-01-10 DIAGNOSIS — H8110 Benign paroxysmal vertigo, unspecified ear: Secondary | ICD-10-CM

## 2020-01-10 DIAGNOSIS — E1165 Type 2 diabetes mellitus with hyperglycemia: Secondary | ICD-10-CM

## 2020-01-10 MED ORDER — ONDANSETRON 4 MG PO TBDP: 4.0000 mg | ORAL_TABLET | Freq: Three times a day (TID) | ORAL | 0 refills | Status: DC | PRN

## 2020-01-10 MED ORDER — OMEPRAZOLE 20 MG PO CPDR: 20.0000 mg | DELAYED_RELEASE_CAPSULE | Freq: Two times a day (BID) | ORAL | 0 refills | Status: DC

## 2020-01-10 MED ORDER — HYOSCYAMINE SULFATE ER 0.375 MG PO TB12: 0.3750 mg | ORAL_TABLET | Freq: Two times a day (BID) | ORAL | 1 refills | Status: DC | PRN

## 2020-01-10 NOTE — Patient Instructions (Addendum)
If you are age 59 or older, your body mass index should be between 23-30. Your Body mass index is 36.65 kg/m. If this is out of the aforementioned range listed, please consider follow up with your Primary Care Provider.  If you are age 37 or younger, your body mass index should be between 19-25. Your Body mass index is 36.65 kg/m. If this is out of the aformentioned range listed, please consider follow up with your Primary Care Provider.   You have been scheduled for a gastric emptying scan at [facility] on 02/02/2020 at 7:30am. Please arrive at least 15 minutes prior to your appointment for registration. Please make certain not to have anything to eat or drink after midnight the night before your test. Hold all stomach medications (ex: Zofran, phenergan, Reglan) 48 hours prior to your test. If you need to reschedule your appointment, please contact radiology scheduling at [phone]. _____________________________________________________________________ A gastric-emptying study measures how long it takes for food to move through your stomach. There are several ways to measure stomach emptying. In the most common test, you eat food that contains a small amount of radioactive material. A scanner that detects the movement of the radioactive material is placed over your abdomen to monitor the rate at which food leaves your stomach. This test normally takes about 4 hours to complete. _____________________________________________________________________  We have sent the following medications to your pharmacy for you to pick up at your convenience: Refills on Zofran, Omeprazole, and hyoscyamine  Due to recent changes in healthcare laws, you may see the results of your imaging and laboratory studies on MyChart before your provider has had a chance to review them.  We understand that in some cases there may be results that are confusing or concerning to you. Not all laboratory results come back in  the same time frame and the provider may be waiting for multiple results in order to interpret others.  Please give [name] 48 hours in order for your provider to thoroughly review all the results before contacting the office for clarification of your results.   Please call to schedule a follow up appointment in 2-3 months.

## 2020-01-10 NOTE — Progress Notes (Signed)
Reviewed and agree with management plan.  Shenna Brissette T. Senai Kingsley, MD FACG [facility]  

## 2020-01-10 NOTE — Progress Notes (Signed)
Chief Complaint: GERD  HPI:    [name] is a 59 year old female with past medical history as listed below, known to Dr. Fuller Plan, who was referred to me by Girtha Rm, NP-C for a complaint of GERD.      06/26/2015 EGD with LA grade a reflux esophagitis, nonbleeding erosive antral gastropathy and otherwise normal.  Biopsy showed reactive gastritis.    08/10/2017 colonoscopy for personal history of adenomatous polyps with one 6 mm polyp in the transverse colon, mild sigmoid diverticulosis and internal hemorrhoids.  Repeat recommended in 5 years.    11/24/2019 patient saw PCP and described epigastric pain and reflux.  Describes some nausea after eating but also on an empty stomach for 2 months with upper abdominal burning and a feeling of a "knot" in her upper stomach.  Described vomiting once weekly for 2 months.    11/24/2019 CMP and CBC within normal limits, lipase and amylase normal.    12/05/2019 ultrasound of the abdomen with no cholelithiasis or sonographic evidence of acute cholecystitis, increased hepatic echogenicity as can be seen with hepatic steatosis.    Today, the patient presents to clinic and tells me that over the past couple of months she has been nauseated.  This correlates with when she was started on Ozempic.  Patient likes this medication though because it is helping with weight loss and helping to control her diabetes.  Tells me that sometimes she will feel like she is just about to vomit, but does not quite get there.  Also experiences some epigastric discomfort from the top of her bellybutton to right under her rib cage.  Recently has been using her Omeprazole just as needed and drinking aloe vera.  Describes a sister with gastroparesis and history of diabetes.  Also uses Zofran as needed for nausea.    Continues with some lower abdominal pain very occasionally for which she will use Hyoscyamine but needs refills.    Denies fever, chills, blood in her stool or symptoms that  awaken her from sleep.  Past Medical History:  Diagnosis Date  . Abdominal pain    due to fibroids  . Anxiety   . Arthritis   . Asthma    stress induced asthma  . Chest pain 06/2019  . Chlamydia   . Chronic kidney disease    ckd stage 1 per 5-26-201 dr Cecille Rubin foster ov note  . Depression   . DM type 2 (diabetes mellitus, type 2) ([place]) dx jan 2021  . Fibroid    uterine  . GERD (gastroesophageal reflux disease)   . Headache   . Hepatic steatosis 12/05/2019  . Hyperlipidemia   . IBS (irritable bowel syndrome)   . Liver nodule 1999   no follow up needed was due to stress  . Neuropathy    both fingers and toes at times  . Obesity   . Protein in urine 03/2019  . Trichomonas   . Tubular adenoma of colon 01/2012    Past Surgical History:  Procedure Laterality Date  . colonscopy  2017  . COLPOSCOPY  1990   results were normal  . DILITATION & CURRETTAGE/HYSTROSCOPY WITH NOVASURE ABLATION N/A 08/23/2019   Procedure: DILATATION & CURETTAGE/DIAGNOSTIC HYSTEROSCOPY;  Surgeon: Lavonia Drafts, MD;  Location: [facility];  Service: Gynecology;  Laterality: N/A;  . IR GENERIC HISTORICAL  03/21/2016   IR EMBO TUMOR ORGAN ISCHEMIA INFARCT INC GUIDE ROADMAPPING 03/21/2016 Arne Cleveland, MD [facility]  . IR GENERIC HISTORICAL  03/21/2016  IR US GUIDE VASC ACCESS RIGHT 03/21/2016 Arne Cleveland, MD [facility]  . IR GENERIC HISTORICAL  03/21/2016   IR ANGIOGRAM SELECTIVE EACH ADDITIONAL VESSEL 03/21/2016 Arne Cleveland, MD [facility]  . IR GENERIC HISTORICAL  03/21/2016   IR ANGIOGRAM SELECTIVE EACH ADDITIONAL VESSEL 03/21/2016 Arne Cleveland, MD [facility]  . IR GENERIC HISTORICAL  03/21/2016   IR ANGIOGRAM PELVIS SELECTIVE OR SUPRASELECTIVE 03/21/2016 Arne Cleveland, MD [facility]  . IR GENERIC HISTORICAL  03/21/2016   IR ANGIOGRAM PELVIS SELECTIVE OR SUPRASELECTIVE 03/21/2016 Arne Cleveland, MD [facility]  . IR GENERIC HISTORICAL  02/27/2016   IR  RADIOLOGIST EVAL & MGMT 02/27/2016 Arne Cleveland, MD [facility]  . IR GENERIC HISTORICAL  04/03/2016   IR RADIOLOGIST EVAL & MGMT 04/03/2016 Arne Cleveland, MD [facility]  . IR RADIOLOGIST EVAL & MGMT  08/26/2016  . labial cyst removal    . TUBAL LIGATION     20 years ago    Current Outpatient Medications  Medication Sig Dispense Refill  . albuterol (VENTOLIN HFA) 108 (90 Base) MCG/ACT inhaler INHALE 2 PUFFS INTO THE LUNGS EVERY 6 (SIX) HOURS AS NEEDED FOR WHEEZING. 18 g 0  . aspirin 81 MG tablet Take 81 mg by mouth daily.      Marland Kitchen atorvastatin (LIPITOR) 10 MG tablet TAKE 1 TABLET BY MOUTH EVERY DAY 90 tablet 0  . Cyanocobalamin (B-12 PO) Take by mouth.    . diclofenac (VOLTAREN) 75 MG EC tablet Take 1 tablet (75 mg total) by mouth 2 (two) times daily as needed. 60 tablet 0  . diclofenac Sodium (VOLTAREN) 1 % GEL APPLY 4 G TOPICALLY 4 (FOUR) TIMES DAILY. (Patient taking differently: Apply 4 g topically as needed. ) 200 g 1  . fluticasone (FLONASE) 50 MCG/ACT nasal spray SPRAY 2 SPRAYS INTO EACH NOSTRIL EVERY DAY 16 g 1  . Glucose Blood (BLOOD GLUCOSE TEST STRIPS) STRP Test once a day. Pt has a onetouch verio flex meter dx e11.9 100 strip 1  . hyoscyamine (LEVBID) 0.375 MG 12 hr tablet TAKE 1 TABLET (0.375 MG TOTAL) BY MOUTH EVERY 12 (TWELVE) HOURS AS NEEDED. 20 tablet 1  . Lancets (ONETOUCH ULTRASOFT) lancets Test once a day. Dx E11.9 pt has onetouch verio flex meter 100 each 1  . lisinopril (ZESTRIL) 2.5 MG tablet Take 2.5 mg by mouth daily. Per dr Cecille Rubin foster 08-17-2019 ov note    . meclizine (ANTIVERT) 25 MG tablet Take 1 tablet (25 mg total) by mouth 2 (two) times daily as needed for dizziness. 30 tablet 0  . Melatonin 5 MG CAPS Take 5 mg by mouth at bedtime as needed (sleep).     . metoprolol tartrate (LOPRESSOR) 100 MG tablet Take 1 tablet (100 mg total) by mouth once for 1 dose. Take (1) tablet 2 hours before your coronary CT 1 tablet 0  . Multiple Vitamin (MULTIVITAMIN) tablet  Take 1 tablet daily by mouth.    Marland Kitchen omeprazole (PRILOSEC) 20 MG capsule TAKE ONE CAPSULE BY MOUTH DAILY 30 capsule 0  . ondansetron (ZOFRAN ODT) 4 MG disintegrating tablet Take 1 tablet (4 mg total) by mouth every 8 (eight) hours as needed for nausea or vomiting. 20 tablet 0  . Semaglutide,0.25 or 0.5MG /DOS, (OZEMPIC, 0.25 OR 0.5 MG/DOSE,) 2 MG/1.5ML SOPN Inject 0.5 mg into the skin once a week. (Patient taking differently: Inject 0.5 mg into the skin once a week. 0.25 weekly on thursday) 1 pen 4  . UNABLE TO FIND Med Name:  vitamin b 12 500 mg daily     . venlafaxine XR (EFFEXOR-XR) 75 MG 24 hr capsule TAKE 1 CAPSULE (75 MG TOTAL) BY MOUTH DAILY WITH BREAKFAST. (Patient taking differently: Take 150 mg by mouth 2 (two) times daily after a meal. ) 90 capsule 0   No current facility-administered medications for this visit.    Allergies as of 01/10/2020 - Review Complete 11/24/2019  Allergen Reaction Noted  . Lamictal [lamotrigine] Rash 01/28/2017    Family History  Problem Relation Age of Onset  . Diabetes Father   . Diabetes Mother   . Hypertension Mother   . Deep vein thrombosis Mother   . Cancer - Other Mother        peritoneal cancer  . Hiatal hernia Mother   . Colon cancer Maternal Grandmother   . Colon polyps Maternal Grandmother   . Hypertension Sister   . Deep vein thrombosis Sister   . Diabetes Sister   . Hiatal hernia Sister   . Rectal cancer Neg Hx   . Stomach cancer Neg Hx   . Pancreatic cancer Neg Hx   . Kidney disease Neg Hx   . Liver disease Neg Hx     Social History   Socioeconomic History  . Marital status: Divorced    Spouse name: Not on file  . Number of children: 2  . Years of education: Not on file  . Highest education level: Not on file  Occupational History  . Not on file  Tobacco Use  . Smoking status: Never Smoker  . Smokeless tobacco: Never Used  Vaping Use  . Vaping Use: Never used  Substance and Sexual Activity  . Alcohol use: Yes     Alcohol/week: 0.0 standard drinks    Comment: occ, not weekly  . Drug use: No  . Sexual activity: Yes  Other Topics Concern  . Not on file  Social History Narrative  . Not on file   Social Determinants of Health   Financial Resource Strain:   . Difficulty of Paying Living Expenses: Not on file  Food Insecurity:   . Worried About Charity fundraiser in the Last Year: Not on file  . Ran Out of Food in the Last Year: Not on file  Transportation Needs:   . Lack of Transportation (Medical): Not on file  . Lack of Transportation (Non-Medical): Not on file  Physical Activity:   . Days of Exercise per Week: Not on file  . Minutes of Exercise per Session: Not on file  Stress:   . Feeling of Stress : Not on file  Social Connections:   . Frequency of Communication with Friends and Family: Not on file  . Frequency of Social Gatherings with Friends and Family: Not on file  . Attends Religious Services: Not on file  . Active Member of Clubs or Organizations: Not on file  . Attends Archivist Meetings: Not on file  . Marital Status: Not on file  Intimate Partner Violence:   . Fear of Current or Ex-Partner: Not on file  . Emotionally Abused: Not on file  . Physically Abused: Not on file  . Sexually Abused: Not on file    Review of Systems:    Constitutional: No fever or chills Cardiovascular: No chest pain Respiratory: No SOB  Gastrointestinal: See HPI and otherwise negative   Physical Exam:  Vital signs: BP 118/78   Pulse (!) 110   Ht 5\' 7"  (1.702 m)   Wt 234  lb (106.1 kg)   LMP 06/23/2014   SpO2 98%   BMI 36.65 kg/m   Constitutional:   Pleasant overweight AA female appears to be in NAD, Well developed, Well nourished, alert and cooperative Respiratory: Respirations even and unlabored. Lungs clear to auscultation bilaterally.   No wheezes, crackles, or rhonchi.  Cardiovascular: Normal S1, S2. No MRG. Regular rate and rhythm. No peripheral edema, cyanosis or pallor.    Gastrointestinal:  Soft, nondistended, nontender. No rebound or guarding. Normal bowel sounds. No appreciable masses or hepatomegaly. Rectal:  Not performed.  Psychiatric:Demonstrates good judgement and reason without abnormal affect or behaviors.  RELEVANT LABS AND IMAGING: CBC    Component Value Date/Time   WBC 5.9 11/24/2019 0918   WBC 5.3 08/23/2019 1230   RBC 5.09 11/24/2019 0918   RBC 4.77 08/23/2019 1230   HGB 12.9 11/24/2019 0918   HCT 38.8 11/24/2019 0918   PLT 254 11/24/2019 0918   MCV 76 (L) 11/24/2019 0918   MCH 25.3 (L) 11/24/2019 0918   MCH 24.9 (L) 08/23/2019 1230   MCHC 33.2 11/24/2019 0918   MCHC 31.2 08/23/2019 1230   RDW 14.2 11/24/2019 0918   LYMPHSABS 2.7 11/24/2019 0918   MONOABS 0.6 09/06/2016 0418   EOSABS 0.1 11/24/2019 0918   BASOSABS 0.0 11/24/2019 0918    CMP     Component Value Date/Time   NA 144 11/24/2019 0918   K 4.2 11/24/2019 0918   CL 106 11/24/2019 0918   CO2 21 11/24/2019 0918   GLUCOSE 132 (H) 11/24/2019 0918   GLUCOSE 91 01/28/2017 1501   BUN 9 11/24/2019 0918   CREATININE 0.77 11/24/2019 0918   CREATININE 0.85 01/28/2017 1501   CALCIUM 9.7 11/24/2019 0918   PROT 7.1 11/24/2019 0918   ALBUMIN 4.5 11/24/2019 0918   AST 16 11/24/2019 0918   ALT 16 11/24/2019 0918   ALKPHOS 87 11/24/2019 0918   BILITOT 0.2 11/24/2019 0918   GFRNONAA 85 11/24/2019 0918   GFRAA 98 11/24/2019 0918    Assessment: 1.  Epigastric pain: With below 2.  Nausea: With change to Ozempic, consider side effect from this medication versus gastritis versus gastroparesis 3.  Fatty liver: Seen at time of recent ultrasound 4.  GERD: History of gastritis/reflux, this could be what is causing her nausea now especially since she has not been a schedule PPI  Plan: 1.  Discussed with patient that the best thing to do would be to stop her Ozempic for a few weeks to see if this alleviates her nausea.  Then at least we would know if this is the cause.  Patient will  discuss further with her PCP. 2.  Prescribed Omeprazole 20 mg twice daily, 30-60 minutes before breakfast and dinner #60 with 3 refills. 3.  Refilled Zofran 4 mg every 4-6 hours as needed for nausea #15 with 3 refills. 4.  Refilled hyoscyamine 5.  Discussed a slow and steady weight loss of 1 to 2 pounds per week given fatty liver 6.  Ordered a gastric emptying study 7.  Patient to follow in clinic for recommendations after imaging above.  Ellouise Newer, PA-C [facility] 01/10/2020, 3:02 PM  Cc: Henson, Vickie L, NP-C

## 2020-01-11 NOTE — Telephone Encounter (Signed)
Pt has occasionally dizziness due to lump on back of ear that she is going to been seen for to get some imaging done. Ok to refill?

## 2020-02-01 ENCOUNTER — Encounter: Payer: Self-pay | Admitting: Family Medicine

## 2020-02-01 MED ORDER — FLUTICASONE PROPIONATE 50 MCG/ACT NA SUSP: NASAL | 1 refills | Status: DC

## 2020-02-02 ENCOUNTER — Encounter (HOSPITAL_COMMUNITY)
Admission: RE | Admit: 2020-02-02 | Discharge: 2020-02-02 | Disposition: A | Discharge status: Home / Self Care | Payer: Medicare HMO | Source: Ambulatory Visit | Attending: Physician Assistant | Admitting: Physician Assistant

## 2020-02-02 ENCOUNTER — Other Ambulatory Visit: Payer: Self-pay

## 2020-02-02 DIAGNOSIS — K582 Mixed irritable bowel syndrome: Secondary | ICD-10-CM | POA: Diagnosis present

## 2020-02-02 DIAGNOSIS — R109 Unspecified abdominal pain: Secondary | ICD-10-CM | POA: Diagnosis present

## 2020-02-02 DIAGNOSIS — R110 Nausea: Secondary | ICD-10-CM | POA: Insufficient documentation

## 2020-02-02 DIAGNOSIS — R112 Nausea with vomiting, unspecified: Secondary | ICD-10-CM | POA: Diagnosis not present

## 2020-02-02 DIAGNOSIS — K760 Fatty (change of) liver, not elsewhere classified: Secondary | ICD-10-CM

## 2020-02-02 DIAGNOSIS — R1013 Epigastric pain: Secondary | ICD-10-CM | POA: Diagnosis not present

## 2020-02-02 MED ORDER — TECHNETIUM TC 99M SULFUR COLLOID
2.0000 | Freq: Once | INTRAVENOUS | Status: AC | PRN
  Administered 2020-02-02: 2 via ORAL

## 2020-02-07 ENCOUNTER — Other Ambulatory Visit: Payer: Self-pay | Admitting: Physician Assistant

## 2020-02-15 NOTE — Patient Instructions (Addendum)
YOU ARE SCHEDULED FOR A COVID TEST _12/3________@_2 :35___________. THIS TEST MUST BE DONE BEFORE SURGERY. GO TO  [place]. [place], [place], IT IS APPROXIMATELY 2 MINUTES PAST [employer] ON THE RIGHT AND REMAIN IN YOUR CAR, THIS IS A DRIVE UP TEST. ONCE YOUR COVID TEST IS DONE PLEASE FOLLOW ALL THE QUARANTINE  INSTRUCTIONS GIVEN IN YOUR HANDOUT.      Your procedure is scheduled on 02/28/20   Report to [place] AT  5:30  A. M.   Call this number if you have problems the morning of surgery  :[phone].   OUR ADDRESS IS [address].   WE ARE LOCATED IN [facility].  PLEASE BRING YOUR INSURANCE CARD AND PHOTO ID DAY OF SURGERY.  ONLY ONE PERSON ALLOWED IN FACILITY WAITING AREA.                                     REMEMBER:  DO NOT EAT FOOD OR DRINK LIQUIDS AFTER MIDNIGHT .   YOU MAY  BRUSH YOUR TEETH MORNING OF SURGERY AND RINSE YOUR MOUTH OUT, NO CHEWING GUM CANDY OR MINTS.   TAKE THESE MEDICATIONS MORNING OF SURGERY WITH A SIP OF WATER: Venlafaxine, Omeprazole. Use your inhaler and bring with you  ONE VISITOR IS ALLOWED IN WAITING ROOM ONLY DAY OF SURGERY.   NO VISITOR MAY SPEND THE NIGHT.  VISITOR ARE ALLOWED TO STAY UNTIL 8:00 PM.                                    DO NOT WEAR JEWERLY, MAKE UP, OR NAIL POLISH ON FINGERNAILS.  DO NOT WEAR LOTIONS, POWDERS, PERFUMES OR DEODORANT.  DO NOT SHAVE FOR 24 HOURS PRIOR TO DAY OF SURGERY.  CONTACTS, GLASSES, OR DENTURES MAY NOT BE WORN TO SURGERY.                                    [place] [facility] IS NOT RESPONSIBLE  FOR ANY BELONGINGS.                                                                    Marland Kitchen      How to Manage Your Diabetes Before and After Surgery  Why is it important to control my blood sugar before and after surgery? . Improving blood sugar levels before and after surgery helps healing and can limit problems. . A way of improving blood sugar control is eating  a healthy diet by: o  Eating less sugar and carbohydrates o  Increasing activity/exercise o  Talking with your doctor about reaching your blood sugar goals . High blood sugars (greater than 180 mg/dL) can raise your risk of infections and slow your recovery, so you will need to focus on controlling your diabetes during the weeks before surgery. . Make sure that the doctor who takes care of your diabetes knows about your planned surgery including the date and location.  How do I manage my blood sugar  before surgery? . Check your blood sugar at least 4 times a day, starting 2 days before surgery, to make sure that the level is not too high or low. o Check your blood sugar the morning of your surgery when you wake up and every 2 hours until you get to the Short Stay unit. . If your blood sugar is less than 70 mg/dL, you will need to treat for low blood sugar: o Do not take insulin. o Treat a low blood sugar (less than 70 mg/dL) with  cup of clear juice (cranberry or apple), 4 glucose tablets, OR glucose gel. o Recheck blood sugar in 15 minutes after treatment (to make sure it is greater than 70 mg/dL). If your blood sugar is not greater than 70 mg/dL on recheck, call [phone] for further instructions. . Report your blood sugar to the short stay nurse when you get to Short Stay.  . If you are admitted to the hospital after surgery: o Your blood sugar will be checked by the staff and you will probably be given insulin after surgery (instead of oral diabetes medicines) to make sure you have good blood sugar levels. o The goal for blood sugar control after surgery is 80-180 mg/dL.   WHAT DO I DO ABOUT MY DIABETES MEDICATION?  Marland Kitchen Do not take oral diabetes medicines (pills) the morning of surgery.  . THE NIGHT BEFORE SURGERY, take     units of       insulin.       . THE MORNING OF SURGERY, take   units of         insulin.  . The day of surgery, do not take other diabetes injectables,  including Byetta (exenatide), Bydureon (exenatide ER), Victoza (liraglutide), or Trulicity (dulaglutide).  . If your CBG is greater than 220 mg/dL, you may take  of your sliding scale  . (correction) dose of insulin.    For patients with insulin pumps: Contact your diabetes doctor for specific instructions before surgery. Decrease basal rates by 20% at midnight the night before your surgery. Note that if your surgery is planned to be longer than 2 hours, your insulin pump will be removed and intravenous (IV) insulin will be started and managed by the nurses and the anesthesiologist. You will be able to restart your insulin pump once you are awake and able to manage it.  Make sure to bring insulin pump supplies to the hospital with you in case the  site needs to be changed.  Patient Signature:  Date:   Nurse Signature:  Date:   Reviewed and Endorsed by [facility] Patient Education Committee, August 2015                                                                                              [facility] - Preparing for Surgery  Before surgery, you can play an important role.  Because skin is not sterile, your skin needs to be as free of germs as possible.  You can reduce the number of germs on your skin by washing with CHG (chlorahexidine gluconate) soap before surgery.  CHG is an antiseptic cleaner which kills germs and bonds with the skin to continue killing germs even after washing. Please DO NOT use if you have an allergy to CHG or antibacterial soaps.  If your skin becomes reddened/irritated stop using the CHG and inform your nurse when you arrive at Short Stay. Do not shave (including legs and underarms) for at least 48 hours prior to the first CHG shower.   Please follow these instructions carefully:  1.  Shower with CHG Soap the night before surgery and the  morning of Surgery.  2.  If you choose to wash your hair, wash your hair first as usual with your  normal  shampoo.  3.   After you shampoo, rinse your hair and body thoroughly to remove the  shampoo.                                        4.  Use CHG as you would any other liquid soap.  You can apply chg directly  to the skin and wash                       Gently with a scrungie or clean washcloth.  5.  Apply the CHG Soap to your body ONLY FROM THE NECK DOWN.   Do not use on face/ open                           Wound or open sores. Avoid contact with eyes, ears mouth and genitals (private parts).                       Wash face,  Genitals (private parts) with your normal soap.             6.  Wash thoroughly, paying special attention to the area where your surgery  will be performed.  7.  Thoroughly rinse your body with warm water from the neck down.  8.  DO NOT shower/wash with your normal soap after using and rinsing off  the CHG Soap.             9.  Pat yourself dry with a clean towel.            10.  Wear clean pajamas.            11.  Place clean sheets on your bed the night of your first shower and do not  sleep with pets. Day of Surgery : Do not apply any lotions/deodorants the morning of surgery.  Please wear clean clothes to the hospital/surgery center.  FAILURE TO FOLLOW THESE INSTRUCTIONS MAY RESULT IN THE CANCELLATION OF YOUR SURGERY PATIENT SIGNATURE_________________________________  NURSE SIGNATURE__________________________________  ________________________________________________________________________

## 2020-02-20 ENCOUNTER — Other Ambulatory Visit: Payer: Self-pay

## 2020-02-20 ENCOUNTER — Encounter (HOSPITAL_COMMUNITY)
Admission: RE | Admit: 2020-02-20 | Discharge: 2020-02-20 | Disposition: A | Discharge status: Home / Self Care | Payer: Medicare HMO | Source: Ambulatory Visit | Attending: Obstetrics & Gynecology | Admitting: Obstetrics & Gynecology

## 2020-02-20 ENCOUNTER — Encounter (HOSPITAL_COMMUNITY): Payer: Self-pay

## 2020-02-20 DIAGNOSIS — Z01818 Encounter for other preprocedural examination: Secondary | ICD-10-CM | POA: Diagnosis not present

## 2020-02-20 LAB — COMPREHENSIVE METABOLIC PANEL
ALT: 20 U/L (ref 0–44)
AST: 23 U/L (ref 15–41)
Albumin: 4.1 g/dL (ref 3.5–5.0)
Alkaline Phosphatase: 77 U/L (ref 38–126)
Anion gap: 10 (ref 5–15)
BUN: 11 mg/dL (ref 6–20)
CO2: 24 mmol/L (ref 22–32)
Calcium: 9.2 mg/dL (ref 8.9–10.3)
Chloride: 105 mmol/L (ref 98–111)
Creatinine, Ser: 0.66 mg/dL (ref 0.44–1.00)
GFR, Estimated: >60 mL/min (ref >60)
Glucose, Bld: 201 mg/dL — ABNORMAL HIGH (ref 70–99)
Potassium: 3.7 mmol/L (ref 3.5–5.1)
Sodium: 139 mmol/L (ref 135–145)
Total Bilirubin: 0.5 mg/dL (ref 0.3–1.2)
Total Protein: 7.7 g/dL (ref 6.5–8.1)

## 2020-02-20 LAB — CBC
HCT: 39.7 % (ref 36.0–46.0)
Hemoglobin: 12.7 g/dL (ref 12.0–15.0)
MCH: 25.2 pg — ABNORMAL LOW (ref 26.0–34.0)
MCHC: 32 g/dL (ref 30.0–36.0)
MCV: 78.9 fL — ABNORMAL LOW (ref 80.0–100.0)
Platelets: 218 10*3/uL (ref 150–400)
RBC: 5.03 MIL/uL (ref 3.87–5.11)
RDW: 14.2 % (ref 11.5–15.5)
WBC: 5.2 10*3/uL (ref 4.0–10.5)
nRBC: 0 % (ref 0.0–0.2)

## 2020-02-20 NOTE — Progress Notes (Signed)
COVID Vaccine Completed:yes Date COVID Vaccine completed:07/28/19 COVID vaccine manufacturer: [employer]     PCP -  Harland Dingwall NP Cardiologist - no  Chest x-ray - no EKG - 02/07/20 -Epic Stress Test - 2014 ECHO - no Cardiac Cath - no Pacemaker/ICD device last checked:NA  Sleep Study - yes -negative CPAP - no  Fasting Blood Sugar - 101-130 Checks Blood Sugar _____ times a day. every other week  Blood Thinner Instructions:ASA/ V. Henson Aspirin Instructions:none. Pt will see Dr. Ihor Dow on wed. 02/22/20 and ask Last Dose:  Anesthesia review:   Patient denies shortness of breath, fever, cough and chest pain at PAT appointment  yes Patient verbalized understanding of instructions that were given to them at the PAT appointment. Patient was also instructed that they will need to review over the PAT instructions again at home before surgery. Yes Pt can climb 1 flight of stairs, do housework and ADLs  without SOB.

## 2020-02-22 ENCOUNTER — Encounter: Payer: Self-pay | Admitting: Obstetrics & Gynecology

## 2020-02-22 ENCOUNTER — Ambulatory Visit (INDEPENDENT_AMBULATORY_CARE_PROVIDER_SITE_OTHER): Payer: Medicare HMO | Admitting: Obstetrics & Gynecology

## 2020-02-22 ENCOUNTER — Other Ambulatory Visit: Payer: Self-pay

## 2020-02-22 VITALS — BP 139/89 | HR 107 | Ht 67.0 in | Wt 237.0 lb

## 2020-02-22 DIAGNOSIS — R102 Pelvic and perineal pain: Secondary | ICD-10-CM | POA: Diagnosis not present

## 2020-02-22 DIAGNOSIS — D250 Submucous leiomyoma of uterus: Secondary | ICD-10-CM

## 2020-02-22 DIAGNOSIS — N950 Postmenopausal bleeding: Secondary | ICD-10-CM

## 2020-02-22 NOTE — Progress Notes (Addendum)
Preoperative History and Physical  [name] is a 59 y.o. [phone] here for surgical management of Pelvic pain, PMPB and uterine fibroids.  A hysteroscopy with resection of fibroid was attempted but, the posterior fibroid was too large and was submucosal.   Proposed surgery: Robot assisted total lap hysterectomy with BSO  Past Medical History:  Diagnosis Date  . Abdominal pain    due to fibroids  . Anxiety   . Arthritis    hips and knees  . Asthma    stress induced asthma  . Chest pain 06/2019  . Chlamydia   . Chronic kidney disease    ckd stage 1 per 5-26-201 dr Cecille Rubin [facility] ov note  . Depression   . DM type 2 (diabetes mellitus, type 2) ([place]) dx jan 2021  . Fibroid    uterine  . GERD (gastroesophageal reflux disease)   . Hepatic steatosis 12/05/2019  . Hyperlipidemia   . IBS (irritable bowel syndrome)   . Liver nodule 1999   no follow up needed was due to stress  . Neuropathy    both fingers and toes at times  . Obesity   . Protein in urine 03/2019  . Trichomonas   . Tubular adenoma of colon 01/2012   Past Surgical History:  Procedure Laterality Date  . colonscopy  2017  . COLPOSCOPY  1990   results were normal  . DILITATION & CURRETTAGE/HYSTROSCOPY WITH NOVASURE ABLATION N/A 08/23/2019   Procedure: DILATATION & CURETTAGE/DIAGNOSTIC HYSTEROSCOPY;  Surgeon: Lavonia Drafts, MD;  Location: [facility];  Service: Gynecology;  Laterality: N/A;  . IR GENERIC HISTORICAL  03/21/2016   IR EMBO TUMOR ORGAN ISCHEMIA INFARCT INC GUIDE ROADMAPPING 03/21/2016 Arne Cleveland, MD [facility]  . IR GENERIC HISTORICAL  03/21/2016   IR US GUIDE VASC ACCESS RIGHT 03/21/2016 Arne Cleveland, MD [facility]  . IR GENERIC HISTORICAL  03/21/2016   IR ANGIOGRAM SELECTIVE EACH ADDITIONAL VESSEL 03/21/2016 Arne Cleveland, MD [facility]  . IR GENERIC HISTORICAL  03/21/2016   IR ANGIOGRAM SELECTIVE EACH ADDITIONAL VESSEL 03/21/2016 Arne Cleveland, MD [facility]  . IR GENERIC HISTORICAL  03/21/2016   IR ANGIOGRAM PELVIS SELECTIVE OR SUPRASELECTIVE 03/21/2016 Arne Cleveland, MD [facility]  . IR GENERIC HISTORICAL  03/21/2016   IR ANGIOGRAM PELVIS SELECTIVE OR SUPRASELECTIVE 03/21/2016 Arne Cleveland, MD [facility]  . IR GENERIC HISTORICAL  02/27/2016   IR RADIOLOGIST EVAL & MGMT 02/27/2016 Arne Cleveland, MD [facility]  . IR GENERIC HISTORICAL  04/03/2016   IR RADIOLOGIST EVAL & MGMT 04/03/2016 Arne Cleveland, MD [facility]  . IR RADIOLOGIST EVAL & MGMT  08/26/2016  . labial cyst removal    . TUBAL LIGATION     20 years ago   OB History    Gravida  4   Para  2   Term  2   Preterm  0   AB  2   Living  2     SAB  2   TAB  0   Ectopic      Multiple      Live Births  2          Patient denies any cervical dysplasia or STIs. (Not in a hospital admission)   Allergies  Allergen Reactions  . Lamictal [Lamotrigine] Rash   Social History:   reports that she has never smoked. She has never used smokeless tobacco. She reports current alcohol use. She reports that she does not use drugs.  Family History  Problem Relation Age of Onset  . Diabetes Father   . Diabetes Mother   . Hypertension Mother   . Deep vein thrombosis Mother   . Cancer - Other Mother        peritoneal cancer  . Hiatal hernia Mother   . Colon cancer Maternal Grandmother   . Colon polyps Maternal Grandmother   . Hypertension Sister   . Deep vein thrombosis Sister   . Diabetes Sister   . Hiatal hernia Sister   . Rectal cancer Neg Hx   . Stomach cancer Neg Hx   . Pancreatic cancer Neg Hx   . Kidney disease Neg Hx   . Liver disease Neg Hx     Review of Systems: Noncontributory  PHYSICAL EXAM: Blood pressure 139/89, pulse (!) 107, height 5\' 7"  (1.702 m), weight 237 lb (107.5 kg), last menstrual period 06/23/2014. General appearance - alert, well appearing, and in no distress Chest - clear to auscultation, no wheezes, rales or  rhonchi, symmetric air entry Heart - normal rate and regular rhythm Abdomen - soft, nontender, nondistended, no masses or organomegaly Pelvic - enlarged 12 weeks sized; mobile; irreg contour. No adnexal masses Extremities - peripheral pulses normal, no pedal edema, no clubbing or cyanosis  Labs: Results for orders placed or performed during the hospital encounter of 02/20/20 (from the past 336 hour(s))  CBC   Collection Time: 02/20/20 11:36 AM  Result Value Ref Range   WBC 5.2 4.0 - 10.5 K/uL   RBC 5.03 3.87 - 5.11 MIL/uL   Hemoglobin 12.7 12.0 - 15.0 g/dL   HCT 39.7 36 - 46 %   MCV 78.9 (L) 80.0 - 100.0 fL   MCH 25.2 (L) 26.0 - 34.0 pg   MCHC 32.0 30.0 - 36.0 g/dL   RDW 14.2 11.5 - 15.5 %   Platelets 218 150 - 400 K/uL   nRBC 0.0 0.0 - 0.2 %  Comprehensive metabolic panel   Collection Time: 02/20/20 11:36 AM  Result Value Ref Range   Sodium 139 135 - 145 mmol/L   Potassium 3.7 3.5 - 5.1 mmol/L   Chloride 105 98 - 111 mmol/L   CO2 24 22 - 32 mmol/L   Glucose, Bld 201 (H) 70 - 99 mg/dL   BUN 11 6 - 20 mg/dL   Creatinine, Ser 0.66 0.44 - 1.00 mg/dL   Calcium 9.2 8.9 - 10.3 mg/dL   Total Protein 7.7 6.5 - 8.1 g/dL   Albumin 4.1 3.5 - 5.0 g/dL   AST 23 15 - 41 U/L   ALT 20 0 - 44 U/L   Alkaline Phosphatase 77 38 - 126 U/L   Total Bilirubin 0.5 0.3 - 1.2 mg/dL   GFR, Estimated >60 >60 mL/min   Anion gap 10 5 - 15  Type and screen [place]   Collection Time: 02/20/20 11:36 AM  Result Value Ref Range   ABO/RH(D) AB POS    Antibody Screen NEG    Sample Expiration 03/05/2020,2359    Extend sample reason      NO TRANSFUSIONS OR PREGNANCY IN THE PAST 3 MONTHS Performed at [facility], [place] [address], [place], [place] [address]     Imaging Studies: NM GASTRIC EMPTYING  Result Date: 02/02/2020 CLINICAL DATA:  Epigastric pain.  Nausea and vomiting.  Diabetes. EXAM: NUCLEAR MEDICINE GASTRIC EMPTYING SCAN TECHNIQUE: After oral ingestion of  radiolabeled meal, sequential abdominal images were obtained for 120 minutes. Residual percentage of  activity remaining within the stomach was calculated at 60 and 120 minutes. RADIOPHARMACEUTICALS:  2.0 mCi Tc-59m sulfur colloid in standardized meal COMPARISON:  None. FINDINGS: Expected location of the stomach in the left upper quadrant. Ingested meal empties the stomach gradually over the course of the study with 52% retention at 60 min and 5% retention at 120 min (normal retention less than 30% at a 120 min). IMPRESSION: Normal gastric emptying study. Electronically Signed   By: Marlaine Hind M.D.   On: 02/02/2020 15:29   08/23/2019 FINAL MICROSCOPIC DIAGNOSIS:   A. ENDOMETRIUM, CURETTAGE:  - Endometrioid type polyp(s).  - Atrophic appearing endometrium.  - There is no evidence of hyperplasia or malignancy.   Assessment: Patient Active Problem List   Diagnosis Date Noted  . Hepatic steatosis 12/05/2019  . Major depression, recurrent, chronic ([place]) 07/07/2019  . Abnormal EKG 07/07/2019  . Benign paroxysmal positional vertigo 06/23/2019  . Nausea 06/23/2019  . Type 2 diabetes mellitus with hyperglycemia, without long-term current use of insulin ([place]) 06/23/2019  . Sensation of fullness in right ear 06/23/2019  . Postmenopausal bleeding 12/07/2018  . Panic attacks 06/11/2018  . Mild intermittent asthma without complication 50/56/9794  . Prediabetes 01/28/2017  . Obesity (BMI 30-39.9) 01/28/2017  . Adjustment disorder with anxiety 09/20/2016  . Uterine leiomyoma 03/21/2016  . Dyslipidemia 05/31/2012  . VAGINAL PRURITUS 01/11/2010  . KNEE PAIN, LEFT 07/20/2009  . Enlarged lymph nodes 07/20/2009  . INSOMNIA 02/06/2009  . ANEMIA, MILD 11/16/2008  . Gastroesophageal reflux disease 08/30/2008  . DIZZINESS 08/30/2008  . BIPOLAR DISORDER UNSPECIFIED 07/27/2007  . Anxiety and depression 10/29/2006  . DISORDER, DEPRESSIVE NEC 10/29/2006  . ALLERGIC RHINITIS 10/29/2006  . IRRITABLE BOWEL  SYNDROME 10/29/2006  . HYPERCHOLESTEROLEMIA, MILD 09/01/2005  . DISORDER, LIVER NOS 08/02/2002    Plan: Patient will undergo surgical management with Robot assisted total lap hysterectomy with BSO.   The risks of surgery were discussed in detail with the patient including but not limited to: bleeding which may require transfusion or reoperation; infection which may require antibiotics; injury to surrounding organs which may involve bowel, bladder, ureters ; need for additional procedures including laparoscopy or laparotomy; thromboembolic phenomenon, surgical site problems and other postoperative/anesthesia complications. Likelihood of success in alleviating the patient's condition was discussed. Routine postoperative instructions will be reviewed with the patient and her family in detail after surgery.  The patient concurred with the proposed plan, giving informed written consent for the surgery.  Patient has been NPO since last night she will remain NPO for procedure.  Anesthesia and OR aware.  Preoperative prophylactic antibiotics and SCDs ordered on call to the OR.  To OR when ready.  Brayley Mackowiak L. Ihor Dow, M.D., [facility] 02/22/2020 2:12 PM

## 2020-02-22 NOTE — H&P (View-Only) (Signed)
Preoperative History and Physical  [name] is a 59 y.o. [phone] here for surgical management of Pelvic pain, PMPB and uterine fibroids.  A hysteroscopy with resection of fibroid was attempted but, the posterior fibroid was too large and was submucosal.   Proposed surgery: Robot assisted total lap hysterectomy with BSO  Past Medical History:  Diagnosis Date  . Abdominal pain    due to fibroids  . Anxiety   . Arthritis    hips and knees  . Asthma    stress induced asthma  . Chest pain 06/2019  . Chlamydia   . Chronic kidney disease    ckd stage 1 per 5-26-201 dr Cecille Rubin [facility] ov note  . Depression   . DM type 2 (diabetes mellitus, type 2) ([place]) dx jan 2021  . Fibroid    uterine  . GERD (gastroesophageal reflux disease)   . Hepatic steatosis 12/05/2019  . Hyperlipidemia   . IBS (irritable bowel syndrome)   . Liver nodule 1999   no follow up needed was due to stress  . Neuropathy    both fingers and toes at times  . Obesity   . Protein in urine 03/2019  . Trichomonas   . Tubular adenoma of colon 01/2012   Past Surgical History:  Procedure Laterality Date  . colonscopy  2017  . COLPOSCOPY  1990   results were normal  . DILITATION & CURRETTAGE/HYSTROSCOPY WITH NOVASURE ABLATION N/A 08/23/2019   Procedure: DILATATION & CURETTAGE/DIAGNOSTIC HYSTEROSCOPY;  Surgeon: Lavonia Drafts, MD;  Location: [facility];  Service: Gynecology;  Laterality: N/A;  . IR GENERIC HISTORICAL  03/21/2016   IR EMBO TUMOR ORGAN ISCHEMIA INFARCT INC GUIDE ROADMAPPING 03/21/2016 Arne Cleveland, MD [facility]  . IR GENERIC HISTORICAL  03/21/2016   IR US GUIDE VASC ACCESS RIGHT 03/21/2016 Arne Cleveland, MD [facility]  . IR GENERIC HISTORICAL  03/21/2016   IR ANGIOGRAM SELECTIVE EACH ADDITIONAL VESSEL 03/21/2016 Arne Cleveland, MD [facility]  . IR GENERIC HISTORICAL  03/21/2016   IR ANGIOGRAM SELECTIVE EACH ADDITIONAL VESSEL 03/21/2016 Arne Cleveland, MD [facility]  . IR GENERIC HISTORICAL  03/21/2016   IR ANGIOGRAM PELVIS SELECTIVE OR SUPRASELECTIVE 03/21/2016 Arne Cleveland, MD [facility]  . IR GENERIC HISTORICAL  03/21/2016   IR ANGIOGRAM PELVIS SELECTIVE OR SUPRASELECTIVE 03/21/2016 Arne Cleveland, MD [facility]  . IR GENERIC HISTORICAL  02/27/2016   IR RADIOLOGIST EVAL & MGMT 02/27/2016 Arne Cleveland, MD [facility]  . IR GENERIC HISTORICAL  04/03/2016   IR RADIOLOGIST EVAL & MGMT 04/03/2016 Arne Cleveland, MD [facility]  . IR RADIOLOGIST EVAL & MGMT  08/26/2016  . labial cyst removal    . TUBAL LIGATION     20 years ago   OB History    Gravida  4   Para  2   Term  2   Preterm  0   AB  2   Living  2     SAB  2   TAB  0   Ectopic      Multiple      Live Births  2          Patient denies any cervical dysplasia or STIs. (Not in a hospital admission)   Allergies  Allergen Reactions  . Lamictal [Lamotrigine] Rash   Social History:   reports that she has never smoked. She has never used smokeless tobacco. She reports current alcohol use. She reports that she does not use drugs.  Family History  Problem Relation Age of Onset  . Diabetes Father   . Diabetes Mother   . Hypertension Mother   . Deep vein thrombosis Mother   . Cancer - Other Mother        peritoneal cancer  . Hiatal hernia Mother   . Colon cancer Maternal Grandmother   . Colon polyps Maternal Grandmother   . Hypertension Sister   . Deep vein thrombosis Sister   . Diabetes Sister   . Hiatal hernia Sister   . Rectal cancer Neg Hx   . Stomach cancer Neg Hx   . Pancreatic cancer Neg Hx   . Kidney disease Neg Hx   . Liver disease Neg Hx     Review of Systems: Noncontributory  PHYSICAL EXAM: Blood pressure 139/89, pulse (!) 107, height 5\' 7"  (1.702 m), weight 237 lb (107.5 kg), last menstrual period 06/23/2014. General appearance - alert, well appearing, and in no distress Chest - clear to auscultation, no wheezes, rales or  rhonchi, symmetric air entry Heart - normal rate and regular rhythm Abdomen - soft, nontender, nondistended, no masses or organomegaly Pelvic - enlarged 12 weeks sized; mobile; irreg contour. No adnexal masses Extremities - peripheral pulses normal, no pedal edema, no clubbing or cyanosis  Labs: Results for orders placed or performed during the hospital encounter of 02/20/20 (from the past 336 hour(s))  CBC   Collection Time: 02/20/20 11:36 AM  Result Value Ref Range   WBC 5.2 4.0 - 10.5 K/uL   RBC 5.03 3.87 - 5.11 MIL/uL   Hemoglobin 12.7 12.0 - 15.0 g/dL   HCT 39.7 36 - 46 %   MCV 78.9 (L) 80.0 - 100.0 fL   MCH 25.2 (L) 26.0 - 34.0 pg   MCHC 32.0 30.0 - 36.0 g/dL   RDW 14.2 11.5 - 15.5 %   Platelets 218 150 - 400 K/uL   nRBC 0.0 0.0 - 0.2 %  Comprehensive metabolic panel   Collection Time: 02/20/20 11:36 AM  Result Value Ref Range   Sodium 139 135 - 145 mmol/L   Potassium 3.7 3.5 - 5.1 mmol/L   Chloride 105 98 - 111 mmol/L   CO2 24 22 - 32 mmol/L   Glucose, Bld 201 (H) 70 - 99 mg/dL   BUN 11 6 - 20 mg/dL   Creatinine, Ser 0.66 0.44 - 1.00 mg/dL   Calcium 9.2 8.9 - 10.3 mg/dL   Total Protein 7.7 6.5 - 8.1 g/dL   Albumin 4.1 3.5 - 5.0 g/dL   AST 23 15 - 41 U/L   ALT 20 0 - 44 U/L   Alkaline Phosphatase 77 38 - 126 U/L   Total Bilirubin 0.5 0.3 - 1.2 mg/dL   GFR, Estimated >60 >60 mL/min   Anion gap 10 5 - 15  Type and screen [place]   Collection Time: 02/20/20 11:36 AM  Result Value Ref Range   ABO/RH(D) AB POS    Antibody Screen NEG    Sample Expiration 03/05/2020,2359    Extend sample reason      NO TRANSFUSIONS OR PREGNANCY IN THE PAST 3 MONTHS Performed at [facility], [place] [address], [place], [place] [address]     Imaging Studies: NM GASTRIC EMPTYING  Result Date: 02/02/2020 CLINICAL DATA:  Epigastric pain.  Nausea and vomiting.  Diabetes. EXAM: NUCLEAR MEDICINE GASTRIC EMPTYING SCAN TECHNIQUE: After oral ingestion of  radiolabeled meal, sequential abdominal images were obtained for 120 minutes. Residual percentage of  activity remaining within the stomach was calculated at 60 and 120 minutes. RADIOPHARMACEUTICALS:  2.0 mCi Tc-21m sulfur colloid in standardized meal COMPARISON:  None. FINDINGS: Expected location of the stomach in the left upper quadrant. Ingested meal empties the stomach gradually over the course of the study with 52% retention at 60 min and 5% retention at 120 min (normal retention less than 30% at a 120 min). IMPRESSION: Normal gastric emptying study. Electronically Signed   By: Marlaine Hind M.D.   On: 02/02/2020 15:29   08/23/2019 FINAL MICROSCOPIC DIAGNOSIS:   A. ENDOMETRIUM, CURETTAGE:  - Endometrioid type polyp(s).  - Atrophic appearing endometrium.  - There is no evidence of hyperplasia or malignancy.   Assessment: Patient Active Problem List   Diagnosis Date Noted  . Hepatic steatosis 12/05/2019  . Major depression, recurrent, chronic ([place]) 07/07/2019  . Abnormal EKG 07/07/2019  . Benign paroxysmal positional vertigo 06/23/2019  . Nausea 06/23/2019  . Type 2 diabetes mellitus with hyperglycemia, without long-term current use of insulin ([place]) 06/23/2019  . Sensation of fullness in right ear 06/23/2019  . Postmenopausal bleeding 12/07/2018  . Panic attacks 06/11/2018  . Mild intermittent asthma without complication 19/14/7829  . Prediabetes 01/28/2017  . Obesity (BMI 30-39.9) 01/28/2017  . Adjustment disorder with anxiety 09/20/2016  . Uterine leiomyoma 03/21/2016  . Dyslipidemia 05/31/2012  . VAGINAL PRURITUS 01/11/2010  . KNEE PAIN, LEFT 07/20/2009  . Enlarged lymph nodes 07/20/2009  . INSOMNIA 02/06/2009  . ANEMIA, MILD 11/16/2008  . Gastroesophageal reflux disease 08/30/2008  . DIZZINESS 08/30/2008  . BIPOLAR DISORDER UNSPECIFIED 07/27/2007  . Anxiety and depression 10/29/2006  . DISORDER, DEPRESSIVE NEC 10/29/2006  . ALLERGIC RHINITIS 10/29/2006  . IRRITABLE BOWEL  SYNDROME 10/29/2006  . HYPERCHOLESTEROLEMIA, MILD 09/01/2005  . DISORDER, LIVER NOS 08/02/2002    Plan: Patient will undergo surgical management with Robot assisted total lap hysterectomy with BSO.   The risks of surgery were discussed in detail with the patient including but not limited to: bleeding which may require transfusion or reoperation; infection which may require antibiotics; injury to surrounding organs which may involve bowel, bladder, ureters ; need for additional procedures including laparoscopy or laparotomy; thromboembolic phenomenon, surgical site problems and other postoperative/anesthesia complications. Likelihood of success in alleviating the patient's condition was discussed. Routine postoperative instructions will be reviewed with the patient and her family in detail after surgery.  The patient concurred with the proposed plan, giving informed written consent for the surgery.  Patient has been NPO since last night she will remain NPO for procedure.  Anesthesia and OR aware.  Preoperative prophylactic antibiotics and SCDs ordered on call to the OR.  To OR when ready.  Hina Gupta L. Ihor Dow, M.D., [facility] 02/22/2020 2:12 PM

## 2020-02-24 ENCOUNTER — Ambulatory Visit (INDEPENDENT_AMBULATORY_CARE_PROVIDER_SITE_OTHER): Payer: Medicare HMO | Admitting: Medical

## 2020-02-24 ENCOUNTER — Telehealth: Payer: Self-pay

## 2020-02-24 ENCOUNTER — Other Ambulatory Visit (HOSPITAL_COMMUNITY)
Admission: RE | Admit: 2020-02-24 | Discharge: 2020-02-24 | Disposition: A | Discharge status: Home / Self Care | Payer: Medicare HMO | Source: Ambulatory Visit | Attending: Obstetrics & Gynecology | Admitting: Obstetrics & Gynecology

## 2020-02-24 ENCOUNTER — Other Ambulatory Visit: Payer: Self-pay

## 2020-02-24 ENCOUNTER — Encounter: Payer: Self-pay | Admitting: Medical

## 2020-02-24 VITALS — BP 142/86 | HR 102 | Ht 67.0 in | Wt 236.6 lb

## 2020-02-24 DIAGNOSIS — Z20822 Contact with and (suspected) exposure to covid-19: Secondary | ICD-10-CM | POA: Diagnosis not present

## 2020-02-24 DIAGNOSIS — R300 Dysuria: Secondary | ICD-10-CM | POA: Diagnosis not present

## 2020-02-24 DIAGNOSIS — R6883 Chills (without fever): Secondary | ICD-10-CM

## 2020-02-24 DIAGNOSIS — Z01812 Encounter for preprocedural laboratory examination: Secondary | ICD-10-CM | POA: Insufficient documentation

## 2020-02-24 DIAGNOSIS — Z113 Encounter for screening for infections with a predominantly sexual mode of transmission: Secondary | ICD-10-CM | POA: Diagnosis not present

## 2020-02-24 DIAGNOSIS — R69 Illness, unspecified: Secondary | ICD-10-CM | POA: Diagnosis not present

## 2020-02-24 LAB — POCT URINALYSIS DIP (PROADVANTAGE DEVICE)
Blood, UA: NEGATIVE
Glucose, UA: NEGATIVE mg/dL
Ketones, POC UA: NEGATIVE mg/dL
Leukocytes, UA: NEGATIVE
Nitrite, UA: NEGATIVE
Protein Ur, POC: 30 mg/dL — AB
Specific Gravity, Urine: 1.03
Urobilinogen, Ur: 0.2
pH, UA: 5.5 (ref 5.0–8.0)

## 2020-02-24 LAB — SARS CORONAVIRUS 2 (TAT 6-24 HRS): SARS Coronavirus 2: NEGATIVE

## 2020-02-24 MED ORDER — SULFAMETHOXAZOLE-TRIMETHOPRIM 800-160 MG PO TABS: 1.0000 | ORAL_TABLET | Freq: Two times a day (BID) | ORAL | 0 refills | Status: DC

## 2020-02-24 NOTE — Progress Notes (Signed)
Subjective:  [name] is a 59 y.o. female who presents for Chief Complaint  Patient presents with  . Urinary Tract Infection     Here for possible UTI. Is having hysterectomy next week.  Went to Psychologist, sport and exercise this week.  Had mentioned to surgeon feeling some chills, having some pelvic pain.  Used some Azo the last few days.   Concerned about UTI.  Has low back pain and pelvic pain, some chills.  No burning with urination.  Has urinary frequency in general so not sure if any worse.  Maybe some urgency.   No blood in urine. No vaginal discharge.    Is having hysterectomy due to post menopausal bleeding, large fibroids, ongoing pelvic pain.   No cough, no runny nose, no sneezing.     hasn't gotten UTI's often, maybe once every year or every 1-2 years.  No significant hx/o BV, vaginal infection.   No concern for STD, but had sex with long time partner a few weeks ago, unprotected.     No other aggravating or relieving factors.    No other c/o.  The following portions of the patient's history were reviewed and updated as appropriate: allergies, current medications, past family history, past medical history, past social history, past surgical history and problem list.  ROS Otherwise as in subjective above    Objective: BP (!) 142/86   Pulse (!) 102   Ht 5\' 7"  (1.702 m)   Wt 236 lb 9.6 oz (107.3 kg)   LMP 06/23/2014   SpO2 96%   BMI 37.06 kg/m   General appearance: alert, no distress, well developed, well nourished Neck: supple, no lymphadenopathy, no thyromegaly, no masses Heart: RRR, normal S1, S2, no murmurs Lungs: CTA bilaterally, no wheezes, rhonchi, or rales Abdomen: +bs, soft, non tender, non distended, no masses, no hepatomegaly, no splenomegaly Back: nontender Pulses: 2+ radial pulses, 2+ pedal pulses, normal cap refill Ext: no edema   Assessment: Encounter Diagnoses  Name Primary?  . Dysuria Yes  . Chills   . Screen for STD (sexually transmitted disease)       Plan: Discussed symptoms.  UA with no obvious infection.  Discussed possible differential.   Gave Bactrim script in the event of worse UTI symptoms over the weekend.  Labs as below.  Hydrate well.  F/u pending labs.   [name] was seen today for urinary tract infection.  Diagnoses and all orders for this visit:  Dysuria -     Urine Culture -     Chlamydia/Gonococcus/Trichomonas, NAA -     HIV Antibody (routine testing w rflx) -     RPR -     POCT Urinalysis DIP (Proadvantage Device)  Chills -     Chlamydia/Gonococcus/Trichomonas, NAA -     HIV Antibody (routine testing w rflx) -     RPR -     POCT Urinalysis DIP (Proadvantage Device)  Screen for STD (sexually transmitted disease) -     Chlamydia/Gonococcus/Trichomonas, NAA -     HIV Antibody (routine testing w rflx) -     RPR -     POCT Urinalysis DIP (Proadvantage Device)  Other orders -     sulfamethoxazole-trimethoprim (BACTRIM DS) 800-160 MG tablet; Take 1 tablet by mouth 2 (two) times daily.    Follow up: pending labs

## 2020-02-24 NOTE — Telephone Encounter (Signed)
Pt called stating she is scheduled to have a procedure done on Tuesday. Pt states she was at her rental office yesterday and her chair slipped from under her. Pt states she fell on the floor and she is experiencing some soreness on her rt side. Crissy Mccreadie l Timm Bonenberger, CMA

## 2020-02-25 LAB — RPR: RPR Ser Ql: NONREACTIVE

## 2020-02-25 LAB — HIV ANTIBODY (ROUTINE TESTING W REFLEX): HIV Screen 4th Generation wRfx: NONREACTIVE

## 2020-02-26 LAB — URINE CULTURE

## 2020-02-27 ENCOUNTER — Other Ambulatory Visit: Payer: Self-pay | Admitting: Physician Assistant

## 2020-02-27 LAB — CHLAMYDIA/GONOCOCCUS/TRICHOMONAS, NAA
Chlamydia by NAA: NEGATIVE
Gonococcus by NAA: NEGATIVE
Trich vag by NAA: NEGATIVE

## 2020-02-27 NOTE — Anesthesia Preprocedure Evaluation (Addendum)
Anesthesia Evaluation  Patient identified by MRN, date of birth, ID band Patient awake    Reviewed: Allergy & Precautions, NPO status , Patient's Chart, lab work & pertinent test results  Airway Mallampati: II  TM Distance: >3 FB Neck ROM: Full    Dental  (+) Dental Advisory Given   Pulmonary asthma ,    breath sounds clear to auscultation       Cardiovascular negative cardio ROS   Rhythm:Regular Rate:Normal     Neuro/Psych negative neurological ROS     GI/Hepatic Neg liver ROS, GERD  ,  Endo/Other  diabetes  Renal/GU Renal disease     Musculoskeletal  (+) Arthritis ,   Abdominal   Peds  Hematology negative hematology ROS (+)   Anesthesia Other Findings   Reproductive/Obstetrics                            Lab Results  Component Value Date   WBC 5.2 02/20/2020   HGB 12.7 02/20/2020   HCT 39.7 02/20/2020   MCV 78.9 (L) 02/20/2020   PLT 218 02/20/2020   Lab Results  Component Value Date   CREATININE 0.66 02/20/2020   BUN 11 02/20/2020   NA 139 02/20/2020   K 3.7 02/20/2020   CL 105 02/20/2020   CO2 24 02/20/2020    Anesthesia Physical Anesthesia Plan  ASA: II  Anesthesia Plan: General   Post-op Pain Management:    Induction: Intravenous  PONV Risk Score and Plan: 4 or greater and Dexamethasone, Ondansetron, Treatment may vary due to age or medical condition, Scopolamine patch - Pre-op and Midazolam  Airway Management Planned: Oral ETT  Additional Equipment:   Intra-op Plan:   Post-operative Plan: Extubation in OR  Informed Consent: I have reviewed the patients History and Physical, chart, labs and discussed the procedure including the risks, benefits and alternatives for the proposed anesthesia with the patient or authorized representative who has indicated his/her understanding and acceptance.     Dental advisory given  Plan Discussed with:  CRNA  Anesthesia Plan Comments:        Anesthesia Quick Evaluation

## 2020-02-28 ENCOUNTER — Encounter (HOSPITAL_BASED_OUTPATIENT_CLINIC_OR_DEPARTMENT_OTHER)
Admission: RE | Disposition: A | Discharge status: Home / Self Care | Payer: Self-pay | Source: Home / Self Care | Attending: Obstetrics & Gynecology

## 2020-02-28 ENCOUNTER — Telehealth: Payer: Self-pay | Admitting: Obstetrics & Gynecology

## 2020-02-28 ENCOUNTER — Encounter (HOSPITAL_BASED_OUTPATIENT_CLINIC_OR_DEPARTMENT_OTHER): Payer: Self-pay | Admitting: Obstetrics & Gynecology

## 2020-02-28 ENCOUNTER — Ambulatory Visit (HOSPITAL_BASED_OUTPATIENT_CLINIC_OR_DEPARTMENT_OTHER)
Admission: RE | Admit: 2020-02-28 | Discharge: 2020-02-28 | Disposition: A | Discharge status: Home / Self Care | Payer: Medicare HMO | Attending: Obstetrics & Gynecology | Admitting: Obstetrics & Gynecology

## 2020-02-28 ENCOUNTER — Ambulatory Visit (HOSPITAL_BASED_OUTPATIENT_CLINIC_OR_DEPARTMENT_OTHER): Payer: Medicare HMO | Admitting: Anesthesiology

## 2020-02-28 ENCOUNTER — Encounter: Payer: Self-pay | Admitting: Obstetrics & Gynecology

## 2020-02-28 ENCOUNTER — Other Ambulatory Visit: Payer: Self-pay

## 2020-02-28 DIAGNOSIS — N83201 Unspecified ovarian cyst, right side: Secondary | ICD-10-CM | POA: Insufficient documentation

## 2020-02-28 DIAGNOSIS — D259 Leiomyoma of uterus, unspecified: Secondary | ICD-10-CM | POA: Diagnosis not present

## 2020-02-28 DIAGNOSIS — D251 Intramural leiomyoma of uterus: Secondary | ICD-10-CM | POA: Insufficient documentation

## 2020-02-28 DIAGNOSIS — Z888 Allergy status to other drugs, medicaments and biological substances status: Secondary | ICD-10-CM | POA: Diagnosis not present

## 2020-02-28 DIAGNOSIS — N83202 Unspecified ovarian cyst, left side: Secondary | ICD-10-CM | POA: Diagnosis not present

## 2020-02-28 DIAGNOSIS — D219 Benign neoplasm of connective and other soft tissue, unspecified: Secondary | ICD-10-CM

## 2020-02-28 DIAGNOSIS — N950 Postmenopausal bleeding: Secondary | ICD-10-CM | POA: Diagnosis not present

## 2020-02-28 DIAGNOSIS — Z9889 Other specified postprocedural states: Secondary | ICD-10-CM | POA: Diagnosis not present

## 2020-02-28 DIAGNOSIS — J4520 Mild intermittent asthma, uncomplicated: Secondary | ICD-10-CM | POA: Diagnosis not present

## 2020-02-28 DIAGNOSIS — N83291 Other ovarian cyst, right side: Secondary | ICD-10-CM | POA: Diagnosis not present

## 2020-02-28 DIAGNOSIS — R102 Pelvic and perineal pain: Secondary | ICD-10-CM

## 2020-02-28 DIAGNOSIS — D250 Submucous leiomyoma of uterus: Secondary | ICD-10-CM | POA: Diagnosis not present

## 2020-02-28 DIAGNOSIS — E7800 Pure hypercholesterolemia, unspecified: Secondary | ICD-10-CM | POA: Diagnosis not present

## 2020-02-28 DIAGNOSIS — N83292 Other ovarian cyst, left side: Secondary | ICD-10-CM | POA: Diagnosis not present

## 2020-02-28 DIAGNOSIS — D630 Anemia in neoplastic disease: Secondary | ICD-10-CM | POA: Diagnosis not present

## 2020-02-28 HISTORY — PX: ROBOTIC ASSISTED TOTAL HYSTERECTOMY: SHX6085

## 2020-02-28 HISTORY — PX: CYSTOSCOPY: SHX5120

## 2020-02-28 LAB — ABO/RH: ABO/RH(D): AB POS

## 2020-02-28 LAB — TYPE AND SCREEN
ABO/RH(D): AB POS
Antibody Screen: NEGATIVE

## 2020-02-28 LAB — GLUCOSE, CAPILLARY
Glucose-Capillary: 134 mg/dL — ABNORMAL HIGH (ref 70–99)
Glucose-Capillary: 168 mg/dL — ABNORMAL HIGH (ref 70–99)

## 2020-02-28 SURGERY — HYSTERECTOMY, TOTAL, ROBOT-ASSISTED
Anesthesia: General | Laterality: Bilateral

## 2020-02-28 MED ORDER — AMISULPRIDE (ANTIEMETIC) 5 MG/2ML IV SOLN: 10.0000 mg | Freq: Once | INTRAVENOUS | Status: DC | PRN

## 2020-02-28 MED ORDER — TRAMADOL HCL 50 MG PO TABS: 50.0000 mg | ORAL_TABLET | Freq: Four times a day (QID) | ORAL | Status: DC | PRN

## 2020-02-28 MED ORDER — SODIUM CHLORIDE 0.9 % IR SOLN
Status: DC | PRN
  Administered 2020-02-28: 3000 mL via INTRAVESICAL

## 2020-02-28 MED ORDER — BUPIVACAINE HCL (PF) 0.5 % IJ SOLN
INTRAMUSCULAR | Status: DC | PRN
  Administered 2020-02-28: 30 mL

## 2020-02-28 MED ORDER — DOCUSATE SODIUM 100 MG PO CAPS
ORAL_CAPSULE | ORAL | Status: AC
  Filled 2020-02-28: qty 1

## 2020-02-28 MED ORDER — DOCUSATE SODIUM 100 MG PO CAPS: 100.0000 mg | ORAL_CAPSULE | Freq: Two times a day (BID) | ORAL | 0 refills | Status: DC

## 2020-02-28 MED ORDER — ROCURONIUM BROMIDE 10 MG/ML (PF) SYRINGE
PREFILLED_SYRINGE | INTRAVENOUS | Status: AC
  Filled 2020-02-28: qty 10

## 2020-02-28 MED ORDER — EPHEDRINE SULFATE-NACL 50-0.9 MG/10ML-% IV SOSY
PREFILLED_SYRINGE | INTRAVENOUS | Status: DC | PRN
  Administered 2020-02-28: 10 mg via INTRAVENOUS
  Administered 2020-02-28: 20 mg via INTRAVENOUS

## 2020-02-28 MED ORDER — FENTANYL CITRATE (PF) 100 MCG/2ML IJ SOLN
INTRAMUSCULAR | Status: AC
  Filled 2020-02-28: qty 2

## 2020-02-28 MED ORDER — ARTIFICIAL TEARS OPHTHALMIC OINT
TOPICAL_OINTMENT | OPHTHALMIC | Status: AC
  Filled 2020-02-28: qty 3.5

## 2020-02-28 MED ORDER — DEXAMETHASONE SODIUM PHOSPHATE 10 MG/ML IJ SOLN
INTRAMUSCULAR | Status: DC | PRN
  Administered 2020-02-28: 5 mg via INTRAVENOUS

## 2020-02-28 MED ORDER — GABAPENTIN 100 MG PO CAPS
ORAL_CAPSULE | ORAL | Status: AC
  Filled 2020-02-28: qty 1

## 2020-02-28 MED ORDER — PROPOFOL 10 MG/ML IV BOLUS
INTRAVENOUS | Status: DC | PRN
  Administered 2020-02-28: 200 mg via INTRAVENOUS

## 2020-02-28 MED ORDER — IBUPROFEN 800 MG PO TABS: 800.0000 mg | ORAL_TABLET | Freq: Four times a day (QID) | ORAL | 0 refills | Status: DC

## 2020-02-28 MED ORDER — SIMETHICONE 80 MG PO CHEW: 80.0000 mg | CHEWABLE_TABLET | Freq: Four times a day (QID) | ORAL | Status: DC | PRN

## 2020-02-28 MED ORDER — SCOPOLAMINE 1 MG/3DAYS TD PT72
1.0000 | MEDICATED_PATCH | TRANSDERMAL | Status: DC
  Administered 2020-02-28: 1 via TRANSDERMAL

## 2020-02-28 MED ORDER — KETOROLAC TROMETHAMINE 30 MG/ML IJ SOLN: 30.0000 mg | Freq: Once | INTRAMUSCULAR | Status: DC

## 2020-02-28 MED ORDER — DEXAMETHASONE SODIUM PHOSPHATE 10 MG/ML IJ SOLN
INTRAMUSCULAR | Status: AC
  Filled 2020-02-28: qty 1

## 2020-02-28 MED ORDER — BISACODYL 10 MG RE SUPP: 10.0000 mg | Freq: Every day | RECTAL | Status: DC | PRN

## 2020-02-28 MED ORDER — PANTOPRAZOLE SODIUM 40 MG PO TBEC
40.0000 mg | DELAYED_RELEASE_TABLET | Freq: Every day | ORAL | Status: DC
  Administered 2020-02-28: 40 mg via ORAL

## 2020-02-28 MED ORDER — ROCURONIUM BROMIDE 10 MG/ML (PF) SYRINGE
PREFILLED_SYRINGE | INTRAVENOUS | Status: DC | PRN
  Administered 2020-02-28 (×3): 20 mg via INTRAVENOUS
  Administered 2020-02-28: 60 mg via INTRAVENOUS

## 2020-02-28 MED ORDER — PHENYLEPHRINE HCL (PRESSORS) 10 MG/ML IV SOLN
INTRAVENOUS | Status: AC
  Filled 2020-02-28: qty 1

## 2020-02-28 MED ORDER — MIDAZOLAM HCL 2 MG/2ML IJ SOLN
INTRAMUSCULAR | Status: DC | PRN
  Administered 2020-02-28: 2 mg via INTRAVENOUS

## 2020-02-28 MED ORDER — ONDANSETRON HCL 4 MG/2ML IJ SOLN: 4.0000 mg | Freq: Four times a day (QID) | INTRAMUSCULAR | Status: DC | PRN

## 2020-02-28 MED ORDER — OXYCODONE-ACETAMINOPHEN 5-325 MG PO TABS
ORAL_TABLET | ORAL | Status: AC
  Filled 2020-02-28: qty 1

## 2020-02-28 MED ORDER — GABAPENTIN 100 MG PO CAPS
100.0000 mg | ORAL_CAPSULE | Freq: Three times a day (TID) | ORAL | Status: DC
  Administered 2020-02-28: 100 mg via ORAL

## 2020-02-28 MED ORDER — PHENYLEPHRINE HCL-NACL 10-0.9 MG/250ML-% IV SOLN
INTRAVENOUS | Status: DC | PRN
  Administered 2020-02-28: 50 ug/min via INTRAVENOUS

## 2020-02-28 MED ORDER — CEFAZOLIN SODIUM-DEXTROSE 2-4 GM/100ML-% IV SOLN
2.0000 g | INTRAVENOUS | Status: AC
  Administered 2020-02-28: 2 g via INTRAVENOUS

## 2020-02-28 MED ORDER — PHENYLEPHRINE 40 MCG/ML (10ML) SYRINGE FOR IV PUSH (FOR BLOOD PRESSURE SUPPORT)
PREFILLED_SYRINGE | INTRAVENOUS | Status: DC | PRN
  Administered 2020-02-28: 40 ug via INTRAVENOUS
  Administered 2020-02-28 (×3): 120 ug via INTRAVENOUS

## 2020-02-28 MED ORDER — INDIGOTINDISULFONATE SODIUM 8 MG/ML IJ SOLN
INTRAMUSCULAR | Status: DC | PRN
  Administered 2020-02-28: 5 mL via INTRAVENOUS

## 2020-02-28 MED ORDER — ACETAMINOPHEN 500 MG PO TABS
ORAL_TABLET | ORAL | Status: AC
  Filled 2020-02-28: qty 2

## 2020-02-28 MED ORDER — DOCUSATE SODIUM 100 MG PO CAPS
100.0000 mg | ORAL_CAPSULE | Freq: Two times a day (BID) | ORAL | Status: DC
  Administered 2020-02-28: 100 mg via ORAL

## 2020-02-28 MED ORDER — KETOROLAC TROMETHAMINE 30 MG/ML IJ SOLN
INTRAMUSCULAR | Status: AC
  Filled 2020-02-28: qty 1

## 2020-02-28 MED ORDER — GABAPENTIN 300 MG PO CAPS
ORAL_CAPSULE | ORAL | Status: AC
  Filled 2020-02-28: qty 1

## 2020-02-28 MED ORDER — KETOROLAC TROMETHAMINE 30 MG/ML IJ SOLN
INTRAMUSCULAR | Status: DC | PRN
  Administered 2020-02-28: 30 mg via INTRAVENOUS

## 2020-02-28 MED ORDER — FENTANYL CITRATE (PF) 100 MCG/2ML IJ SOLN
25.0000 ug | INTRAMUSCULAR | Status: DC | PRN
  Administered 2020-02-28 (×2): 25 ug via INTRAVENOUS

## 2020-02-28 MED ORDER — LACTATED RINGERS IV SOLN: INTRAVENOUS | Status: DC

## 2020-02-28 MED ORDER — PANTOPRAZOLE SODIUM 40 MG PO TBEC
DELAYED_RELEASE_TABLET | ORAL | Status: AC
  Filled 2020-02-28: qty 1

## 2020-02-28 MED ORDER — KETAMINE HCL 10 MG/ML IJ SOLN
INTRAMUSCULAR | Status: DC | PRN
  Administered 2020-02-28: 50 mg via INTRAVENOUS

## 2020-02-28 MED ORDER — PHENYLEPHRINE 40 MCG/ML (10ML) SYRINGE FOR IV PUSH (FOR BLOOD PRESSURE SUPPORT)
PREFILLED_SYRINGE | INTRAVENOUS | Status: AC
  Filled 2020-02-28: qty 10

## 2020-02-28 MED ORDER — IBUPROFEN 800 MG PO TABS: 800.0000 mg | ORAL_TABLET | Freq: Four times a day (QID) | ORAL | Status: DC

## 2020-02-28 MED ORDER — LIDOCAINE 2% (20 MG/ML) 5 ML SYRINGE
INTRAMUSCULAR | Status: DC | PRN
  Administered 2020-02-28: 60 mg via INTRAVENOUS

## 2020-02-28 MED ORDER — PROPOFOL 10 MG/ML IV BOLUS
INTRAVENOUS | Status: AC
  Filled 2020-02-28: qty 40

## 2020-02-28 MED ORDER — CEFAZOLIN SODIUM-DEXTROSE 2-4 GM/100ML-% IV SOLN
INTRAVENOUS | Status: AC
  Filled 2020-02-28: qty 100

## 2020-02-28 MED ORDER — ONDANSETRON HCL 4 MG/2ML IJ SOLN
INTRAMUSCULAR | Status: DC | PRN
  Administered 2020-02-28: 4 mg via INTRAVENOUS

## 2020-02-28 MED ORDER — LIDOCAINE HCL (PF) 2 % IJ SOLN
INTRAMUSCULAR | Status: AC
  Filled 2020-02-28: qty 5

## 2020-02-28 MED ORDER — DEXTROSE-NACL 5-0.45 % IV SOLN: INTRAVENOUS | Status: DC

## 2020-02-28 MED ORDER — POLYETHYLENE GLYCOL 3350 17 G PO PACK: 17.0000 g | PACK | Freq: Every day | ORAL | Status: DC | PRN

## 2020-02-28 MED ORDER — ZOLPIDEM TARTRATE 5 MG PO TABS: 5.0000 mg | ORAL_TABLET | Freq: Every evening | ORAL | Status: DC | PRN

## 2020-02-28 MED ORDER — POVIDONE-IODINE 10 % EX SWAB: 2.0000 "application " | Freq: Once | CUTANEOUS | Status: DC

## 2020-02-28 MED ORDER — MIDAZOLAM HCL 2 MG/2ML IJ SOLN
INTRAMUSCULAR | Status: AC
  Filled 2020-02-28: qty 2

## 2020-02-28 MED ORDER — ONDANSETRON HCL 4 MG PO TABS: 4.0000 mg | ORAL_TABLET | Freq: Four times a day (QID) | ORAL | Status: DC | PRN

## 2020-02-28 MED ORDER — ALBUTEROL SULFATE HFA 108 (90 BASE) MCG/ACT IN AERS: 2.0000 | INHALATION_SPRAY | Freq: Four times a day (QID) | RESPIRATORY_TRACT | Status: DC | PRN

## 2020-02-28 MED ORDER — MENTHOL 3 MG MT LOZG: 1.0000 | LOZENGE | OROMUCOSAL | Status: DC | PRN

## 2020-02-28 MED ORDER — OXYCODONE-ACETAMINOPHEN 5-325 MG PO TABS: 1.0000 | ORAL_TABLET | Freq: Four times a day (QID) | ORAL | 0 refills | Status: DC | PRN

## 2020-02-28 MED ORDER — OXYCODONE-ACETAMINOPHEN 5-325 MG PO TABS
1.0000 | ORAL_TABLET | ORAL | Status: DC | PRN
  Administered 2020-02-28: 1 via ORAL

## 2020-02-28 MED ORDER — FENTANYL CITRATE (PF) 250 MCG/5ML IJ SOLN
INTRAMUSCULAR | Status: AC
  Filled 2020-02-28: qty 5

## 2020-02-28 MED ORDER — ACETAMINOPHEN 500 MG PO TABS
1000.0000 mg | ORAL_TABLET | ORAL | Status: AC
  Administered 2020-02-28: 1000 mg via ORAL

## 2020-02-28 MED ORDER — FENTANYL CITRATE (PF) 100 MCG/2ML IJ SOLN
INTRAMUSCULAR | Status: DC | PRN
  Administered 2020-02-28: 100 ug via INTRAVENOUS
  Administered 2020-02-28: 50 ug via INTRAVENOUS

## 2020-02-28 MED ORDER — LIDOCAINE HCL 2 % IJ SOLN
INTRAMUSCULAR | Status: AC
  Filled 2020-02-28: qty 20

## 2020-02-28 MED ORDER — SUGAMMADEX SODIUM 200 MG/2ML IV SOLN
INTRAVENOUS | Status: DC | PRN
  Administered 2020-02-28: 200 mg via INTRAVENOUS

## 2020-02-28 MED ORDER — GABAPENTIN 300 MG PO CAPS
300.0000 mg | ORAL_CAPSULE | ORAL | Status: AC
  Administered 2020-02-28: 300 mg via ORAL

## 2020-02-28 MED ORDER — ONDANSETRON HCL 4 MG/2ML IJ SOLN
INTRAMUSCULAR | Status: AC
  Filled 2020-02-28: qty 2

## 2020-02-28 MED ORDER — HYDROMORPHONE HCL 1 MG/ML IJ SOLN: 0.2000 mg | INTRAMUSCULAR | Status: DC | PRN

## 2020-02-28 MED ORDER — KETAMINE HCL 10 MG/ML IJ SOLN
INTRAMUSCULAR | Status: AC
  Filled 2020-02-28: qty 1

## 2020-02-28 MED ORDER — LIDOCAINE 2% (20 MG/ML) 5 ML SYRINGE
INTRAMUSCULAR | Status: DC | PRN
  Administered 2020-02-28: 1.5 mg/kg/h via INTRAVENOUS

## 2020-02-28 MED ORDER — SOD CITRATE-CITRIC ACID 500-334 MG/5ML PO SOLN: 30.0000 mL | ORAL | Status: DC

## 2020-02-28 MED ORDER — KETOROLAC TROMETHAMINE 30 MG/ML IJ SOLN: 30.0000 mg | Freq: Four times a day (QID) | INTRAMUSCULAR | Status: DC

## 2020-02-28 MED ORDER — SCOPOLAMINE 1 MG/3DAYS TD PT72
MEDICATED_PATCH | TRANSDERMAL | Status: AC
  Filled 2020-02-28: qty 1

## 2020-02-28 SURGICAL SUPPLY — 74 items
ADH SKN CLS APL DERMABOND .7 (GAUZE/BANDAGES/DRESSINGS) ×2
APL SRG 38 LTWT LNG FL B (MISCELLANEOUS)
APPLICATOR ARISTA FLEXITIP XL (MISCELLANEOUS) IMPLANT
APPLIER CLIP 5 13 M/L LIGAMAX5 (MISCELLANEOUS)
APR CLP MED LRG 5 ANG JAW (MISCELLANEOUS)
BARRIER ADHS 3X4 INTERCEED (GAUZE/BANDAGES/DRESSINGS) IMPLANT
BRR ADH 4X3 ABS CNTRL BYND (GAUZE/BANDAGES/DRESSINGS)
CANISTER SUCT 3000ML PPV (MISCELLANEOUS) ×3 IMPLANT
CATH FOLEY 3WAY  5CC 16FR (CATHETERS) ×3
CATH FOLEY 3WAY 5CC 16FR (CATHETERS) ×2 IMPLANT
CLIP APPLIE 5 13 M/L LIGAMAX5 (MISCELLANEOUS) IMPLANT
COVER BACK TABLE 60X90IN (DRAPES) ×3 IMPLANT
COVER TIP SHEARS 8 DVNC (MISCELLANEOUS) ×2 IMPLANT
COVER TIP SHEARS 8MM DA VINCI (MISCELLANEOUS) ×3
COVER WAND RF STERILE (DRAPES) ×3 IMPLANT
DECANTER SPIKE VIAL GLASS SM (MISCELLANEOUS) ×3 IMPLANT
DEFOGGER SCOPE WARMER CLEARIFY (MISCELLANEOUS) ×3 IMPLANT
DERMABOND ADVANCED (GAUZE/BANDAGES/DRESSINGS) ×1
DERMABOND ADVANCED .7 DNX12 (GAUZE/BANDAGES/DRESSINGS) ×2 IMPLANT
DRAPE ARM DVNC X/XI (DISPOSABLE) ×8 IMPLANT
DRAPE COLUMN DVNC XI (DISPOSABLE) ×2 IMPLANT
DRAPE DA VINCI XI ARM (DISPOSABLE) ×12
DRAPE DA VINCI XI COLUMN (DISPOSABLE) ×3
DRAPE UTILITY XL STRL (DRAPES) ×3 IMPLANT
DURAPREP 26ML APPLICATOR (WOUND CARE) ×3 IMPLANT
ELECT REM PT RETURN 9FT ADLT (ELECTROSURGICAL) ×3
ELECTRODE REM PT RTRN 9FT ADLT (ELECTROSURGICAL) ×2 IMPLANT
GLOVE BIO SURGEON STRL SZ7 (GLOVE) ×9 IMPLANT
GLOVE BIOGEL PI IND STRL 7.0 (GLOVE) ×6 IMPLANT
GLOVE BIOGEL PI INDICATOR 7.0 (GLOVE) ×3
HEMOSTAT ARISTA ABSORB 3G PWDR (HEMOSTASIS) IMPLANT
HOLDER FOLEY CATH W/STRAP (MISCELLANEOUS) IMPLANT
IRRIG SUCT STRYKERFLOW 2 WTIP (MISCELLANEOUS) ×3
IRRIGATION SUCT STRKRFLW 2 WTP (MISCELLANEOUS) ×2 IMPLANT
KIT TURNOVER CYSTO (KITS) ×3 IMPLANT
LEGGING LITHOTOMY PAIR STRL (DRAPES) ×3 IMPLANT
MANIPULATOR ADVINCU DEL 2.5 PL (MISCELLANEOUS) ×3 IMPLANT
MANIPULATOR ADVINCU DEL 3.0 PL (MISCELLANEOUS) IMPLANT
MANIPULATOR ADVINCU DEL 3.5 PL (MISCELLANEOUS) IMPLANT
MANIPULATOR ADVINCU DEL 4.0 PL (MISCELLANEOUS) IMPLANT
NEEDLE INSUFFLATION 120MM (ENDOMECHANICALS) ×3 IMPLANT
OBTURATOR OPTICAL STANDARD 8MM (TROCAR) ×3
OBTURATOR OPTICAL STND 8 DVNC (TROCAR) ×2
OBTURATOR OPTICALSTD 8 DVNC (TROCAR) ×2 IMPLANT
OCCLUDER COLPOPNEUMO (BALLOONS) ×3 IMPLANT
PACK ROBOT WH (CUSTOM PROCEDURE TRAY) ×3 IMPLANT
PACK ROBOTIC GOWN (GOWN DISPOSABLE) ×3 IMPLANT
PACK TRENDGUARD 450 HYBRID PRO (MISCELLANEOUS) ×2 IMPLANT
PAD OB MATERNITY 4.3X12.25 (PERSONAL CARE ITEMS) ×3 IMPLANT
PAD PREP 24X48 CUFFED NSTRL (MISCELLANEOUS) ×3 IMPLANT
PROTECTOR NERVE ULNAR (MISCELLANEOUS) ×3 IMPLANT
SEAL CANN UNIV 5-8 DVNC XI (MISCELLANEOUS) ×8 IMPLANT
SEAL XI 5MM-8MM UNIVERSAL (MISCELLANEOUS) ×12
SEALER VESSEL DA VINCI XI (MISCELLANEOUS) ×3
SEALER VESSEL EXT DVNC XI (MISCELLANEOUS) ×2 IMPLANT
SET IRRIG Y TYPE TUR BLADDER L (SET/KITS/TRAYS/PACK) ×3 IMPLANT
SET TRI-LUMEN FLTR TB AIRSEAL (TUBING) ×3 IMPLANT
SUT VIC AB 0 CT1 27 (SUTURE) ×6
SUT VIC AB 0 CT1 27XBRD ANBCTR (SUTURE) ×4 IMPLANT
SUT VIC AB 4-0 PS2 18 (SUTURE) ×9 IMPLANT
SUT VICRYL 0 UR6 27IN ABS (SUTURE) ×3 IMPLANT
SUT VLOC 180 0 9IN  GS21 (SUTURE) ×12
SUT VLOC 180 0 9IN GS21 (SUTURE) ×8 IMPLANT
SYSTEM CARTER THOMASON II (TROCAR) IMPLANT
TIP RUMI ORANGE 6.7MMX12CM (TIP) ×3 IMPLANT
TIP UTERINE 5.1X6CM LAV DISP (MISCELLANEOUS) ×3 IMPLANT
TIP UTERINE 6.7X10CM GRN DISP (MISCELLANEOUS) ×3 IMPLANT
TIP UTERINE 6.7X6CM WHT DISP (MISCELLANEOUS) ×3 IMPLANT
TIP UTERINE 6.7X8CM BLUE DISP (MISCELLANEOUS) ×3 IMPLANT
TOWEL OR 17X26 10 PK STRL BLUE (TOWEL DISPOSABLE) ×3 IMPLANT
TRENDGUARD 450 HYBRID PRO PACK (MISCELLANEOUS) ×3
TROCAR BLADELESS OPT 5 100 (ENDOMECHANICALS) IMPLANT
TROCAR PORT AIRSEAL 5X120 (TROCAR) ×3 IMPLANT
WATER STERILE IRR 1000ML POUR (IV SOLUTION) ×3 IMPLANT

## 2020-02-28 NOTE — Anesthesia Procedure Notes (Signed)
Procedure Name: Intubation Date/Time: 02/28/2020 7:37 AM Performed by: Mechele Claude, CRNA Pre-anesthesia Checklist: Patient identified, Emergency Drugs available, Suction available and Patient being monitored Patient Re-evaluated:Patient Re-evaluated prior to induction Oxygen Delivery Method: Circle system utilized Preoxygenation: Pre-oxygenation with 100% oxygen Induction Type: IV induction and Cricoid Pressure applied Ventilation: Mask ventilation without difficulty Laryngoscope Size: Mac and 3 Grade View: Grade II Tube type: Oral Tube size: 7.0 mm Number of attempts: 1 Airway Equipment and Method: Stylet and Oral airway Placement Confirmation: ETT inserted through vocal cords under direct vision,  positive ETCO2 and breath sounds checked- equal and bilateral Secured at: 21 cm Tube secured with: Tape Dental Injury: Teeth and Oropharynx as per pre-operative assessment

## 2020-02-28 NOTE — Interval H&P Note (Signed)
History and Physical Interval Note:  02/28/2020 7:24 AM  [name]  has presented today for surgery, with the diagnosis of Fibroids.  The various methods of treatment have been discussed with the patient and family. After consideration of risks, benefits and other options for treatment, the patient has consented to  Procedure(s): XI ROBOTIC ASSISTED TOTAL HYSTERECTOMY AND BILATERAL SALPINGECTOMY (Bilateral) as a surgical intervention.  The patient's history has been reviewed, patient examined, no change in status, stable for surgery.  I have reviewed the patient's chart and labs.  Questions were answered to the patient's satisfaction.     Lavonia Drafts

## 2020-02-28 NOTE — Telephone Encounter (Signed)
TC to pts daughter to inform of pts post op state. All questions were answered.   Sherilyn Windhorst L. Harraway-Smith, M.D., Cherlynn June

## 2020-02-28 NOTE — Transfer of Care (Signed)
Immediate Anesthesia Transfer of Care Note  Patient: SYREETA FIGLER  Procedure(s) Performed: Procedure(s) (LRB): XI ROBOTIC ASSISTED TOTAL HYSTERECTOMY AND BILATERAL SALPINGECTOMY (Bilateral) CYSTOSCOPY  Patient Location: PACU  Anesthesia Type: General  Level of Consciousness: awake, alert  and oriented  Airway & Oxygen Therapy: Patient Spontanous Breathing and Patient connected to face mask oxygen  Post-op Assessment: Report given to PACU RN and Post -op Vital signs reviewed and stable  Post vital signs: Reviewed and stable  Complications: No apparent anesthesia complications Last Vitals:  Vitals Value Taken Time  BP 135/92 02/28/20 1018  Temp    Pulse 91 02/28/20 1021  Resp 20 02/28/20 1021  SpO2 98 % 02/28/20 1021  Vitals shown include unvalidated device data.  Last Pain:  Vitals:   02/28/20 0628  TempSrc: Oral  PainSc: 0-No pain      Patients Stated Pain Goal: 5 (34/62/19 4712)  Complications: No complications documented.

## 2020-02-28 NOTE — Anesthesia Postprocedure Evaluation (Signed)
Anesthesia Post Note  Patient: [name]  Procedure(s) Performed: XI ROBOTIC ASSISTED TOTAL HYSTERECTOMY AND BILATERAL SALPINGECTOMY (Bilateral ) CYSTOSCOPY     Patient location during evaluation: PACU Anesthesia Type: General Level of consciousness: awake and alert Pain management: pain level controlled Vital Signs Assessment: post-procedure vital signs reviewed and stable Respiratory status: spontaneous breathing, nonlabored ventilation, respiratory function stable and patient connected to nasal cannula oxygen Cardiovascular status: blood pressure returned to baseline and stable Postop Assessment: no apparent nausea or vomiting Anesthetic complications: no   No complications documented.  Last Vitals:  Vitals:   02/28/20 1130 02/28/20 1145  BP: (!) 142/77 138/81  Pulse: 78 93  Resp: 12 16  Temp: 36.7 C 36.7 C  SpO2: 100% 94%    Last Pain:  Vitals:   02/28/20 1145  TempSrc:   PainSc: 3                  Tiajuana Amass

## 2020-02-28 NOTE — Interval H&P Note (Signed)
History and Physical Interval Note:  02/28/2020 7:25 AM  [name]  has presented today for surgery, with the diagnosis of Fibroids.  The various methods of treatment have been discussed with the patient and family. After consideration of risks, benefits and other options for treatment, the patient has consented to  Procedure(s): XI ROBOTIC ASSISTED TOTAL HYSTERECTOMY AND BILATERAL SALPINGECTOMY (Bilateral) as a surgical intervention.  The patient's history has been reviewed, patient examined, no change in status, stable for surgery.  I have reviewed the patient's chart and labs.  Questions were answered to the patient's satisfaction.     Audie Wieser Harraway-Smith  No new issues identified. Confirmed desire for BSO. Reviewed post op instructions.   Rasheed Welty L. Harraway-Smith, M.D., Cherlynn June

## 2020-02-28 NOTE — Discharge Instructions (Signed)

## 2020-02-28 NOTE — Op Note (Signed)
02/28/2020  10:32 AM  PATIENT:  [name]  59 y.o. female  PRE-OPERATIVE DIAGNOSIS:  Fibroids  POST-OPERATIVE DIAGNOSIS:  Fibroids  PROCEDURE:  Procedure(s): XI ROBOTIC ASSISTED TOTAL HYSTERECTOMY AND BILATERAL SALPINGECTOMY (Bilateral) CYSTOSCOPY  SURGEON:  Surgeon(s) and Role:    * Lavonia Drafts, MD - Primary    * Sloan Leiter, MD - Assisting  ANESTHESIA:   local and general  EBL:  50 mL   BLOOD ADMINISTERED:none  DRAINS: none   LOCAL MEDICATIONS USED:  MARCAINE     SPECIMEN:  Source of Specimen:  uterus, falloipian tubes and ovaries.   DISPOSITION OF SPECIMEN:  PATHOLOGY  COUNTS:  YES  TOURNIQUET:  * No tourniquets in log *  DICTATION: .Note written in [place]: admit for prolonged observation then discharge to home after 6 hours.  PATIENT DISPOSITION:  PACU - hemodynamically stable.   Delay start of Pharmacological VTE agent (>24hrs) due to surgical blood loss or risk of bleeding: yes  Complications: none immediate.   Indications: pt with h/o PMPB and pelvic pain. Unsuccessful resection of fibroid via hysteroscope.   The risks, benefits, and alternatives of surgery were explained, understood, and accepted. Consents were signed. All questions were answered. She was taken to the operating room and general anesthesia was applied without complication. She was placed in the dorsal lithotomy position and her abdomen and vagina were prepped and draped after she had been carefully positioned on the table. A bimanual exam revealed a 8 week size uterus that was mobile. Her adnexa were not enlarged. The cervix was measured and the uterus was sounded to 6 cm. A uterine manipulator with KOH ring was placed without difficulty. A Foley catheter was placed and it drained clear throughout the case. Gloves were changed and attention was turned to the abdomen. A 45mm incision was made in the umbilicus and a Veress needle was placed intraperitoneally. CO2  was used to insufflate the abdomen to approximately 4 L. After good pneumoperitoneum was established,an 8 mm trocar was placed in the umbilicus using the Optiview trocar.    The patient was placed in Trendelenburg position and ports were placed in appropriate positions on her abdomen to allow maximum exposure during the robotic case. Specifically there was an 35mm assistant port placed in the left upper quadrant under direct laproscopic visualization. Two 8 mm ports were placed 8cm lateral to the midline port.  These were all placed under direct laparoscopic visualization. The robot was docked and I proceeded with a robotic portion of the case.  The pelvis was inspected and the uterus was small. There were some pelvic adhesion that were minimal. The fallopian tubes and ovaries were found to be normal. The ureters and the infundibulopelvic ligaments were identified.The infundibulopelvic ligament was identified, grasped with the vessel sealer and transected. The Vessel sealer instrument was used for all transection and cautery. The round ligaments were identified, cauterized and ligated, a bladder flap was created anteriorly. The uterine vessels were identified and cauterized and then cut.The bladder was pushed out of the operative site and an anterior colpotomy was made. The colpotomy incision was extended circumferentially, following the blue outline of the KOH ring. All pedicles were hemostatic.  The uterus was removed from the vagina with the fallopian tube segments. The vaginal cuff was closed with v-lock suture.  Excellent hemostasis was noted throughout. The pelvis was irrigated. The intraabdominal pressure was lowered assess hemostasis. After determining excellent hemostasis, the robot was undocked. At this  point I performed cystoscopy. The cystoscopy revealed blue ejection from both ureters. The skin from all of the ports was closed with 3-0 vicryl. 30cc of 0.5% Marcaine was injected into the port sites.   The patient was then extubated and taken to recovery in stable condition.   Sponge, lap and needle counts were correct x 2.  An experienced assistant was required given the standard of surgical care given the complexity of the case.  This assistant was needed for exposure, dissection, suctioning, retraction, instrument exchange and for overall help during the procedure.  Allison Deshotels L. Harraway-Smith, M.D., Cherlynn June

## 2020-02-28 NOTE — Brief Op Note (Signed)
02/28/2020  10:32 AM  PATIENT:  [name]  59 y.o. female  PRE-OPERATIVE DIAGNOSIS:  Fibroids  POST-OPERATIVE DIAGNOSIS:  Fibroids  PROCEDURE:  Procedure(s): XI ROBOTIC ASSISTED TOTAL HYSTERECTOMY AND BILATERAL SALPINGECTOMY (Bilateral) CYSTOSCOPY  SURGEON:  Surgeon(s) and Role:    * Lavonia Drafts, MD - Primary    * Sloan Leiter, MD - Assisting  ANESTHESIA:   local and general  EBL:  50 mL   BLOOD ADMINISTERED:none  DRAINS: none   LOCAL MEDICATIONS USED:  MARCAINE     SPECIMEN:  Source of Specimen:  uterus, falloipian tubes and ovaries.   DISPOSITION OF SPECIMEN:  PATHOLOGY  COUNTS:  YES  TOURNIQUET:  * No tourniquets in log *  DICTATION: .Note written in [place]: admit for prolonged observation then discharge to home after 6 hours.  PATIENT DISPOSITION:  PACU - hemodynamically stable.   Delay start of Pharmacological VTE agent (>24hrs) due to surgical blood loss or risk of bleeding: yes  Complications: none immediate.   Quantrell Splitt L. Harraway-Smith, M.D., Cherlynn June

## 2020-02-29 ENCOUNTER — Encounter (HOSPITAL_BASED_OUTPATIENT_CLINIC_OR_DEPARTMENT_OTHER): Payer: Self-pay | Admitting: Obstetrics & Gynecology

## 2020-02-29 LAB — SURGICAL PATHOLOGY

## 2020-03-01 ENCOUNTER — Other Ambulatory Visit: Payer: Self-pay | Admitting: Family Medicine

## 2020-03-01 NOTE — Telephone Encounter (Signed)
This was refilled on 02/01/20 with additional refill

## 2020-03-02 NOTE — Discharge Summary (Signed)
Physician Discharge Summary  Patient ID: [name] MRN: [id number] DOB/AGE: Sep 16, 1960 59 y.o.  Admit date: 02/28/2020 Discharge date: 03/02/2020  Admission Diagnoses: pelvic pain, fibroids, post menopausal bleeding   Discharge Diagnoses:  Principal Problem:   Postmenopausal bleeding Active Problems:   Fibroid   Post-operative state   Pelvic pain   Discharged Condition: good  Hospital Course: Patient had an uncomplicated surgery; for further details of this surgery, please refer to the operative note. Furthermore, the patient had an uncomplicated postoperative course.  By time of discharge, her pain was controlled on oral pain medications; she was ambulating, voiding without difficulty, tolerating regular diet and passing flatus.  She was deemed stable for discharge to home.    Consults: None  Significant Diagnostic Studies: labs: CBC, [name], endo biopsy  Treatments: surgery: Robot assisted total lap hysterectomy with BSO  Discharge Exam: Blood pressure 126/77, pulse (!) 102, temperature 99 F (37.2 C), resp. rate 20, height 5\' 7"  (1.702 m), weight 108 kg, last menstrual period 06/23/2014, SpO2 96 %. General appearance: alert and cooperative  Disposition: Discharge disposition: 01-Home or Self Care       Discharge Instructions    Call MD for:  difficulty breathing, headache or visual disturbances   Complete by: As directed    Call MD for:  extreme fatigue   Complete by: As directed    Call MD for:  hives   Complete by: As directed    Call MD for:  persistant dizziness or light-headedness   Complete by: As directed    Call MD for:  persistant nausea and vomiting   Complete by: As directed    Call MD for:  redness, tenderness, or signs of infection (pain, swelling, redness, odor or green/yellow discharge around incision site)   Complete by: As directed    Call MD for:  severe uncontrolled pain   Complete by: As directed    Call MD for:  temperature >100.4    Complete by: As directed    Diet - low sodium heart healthy   Complete by: As directed    Discharge wound care:   Complete by: As directed    Keep port sites clean and dry.   Driving Restrictions   Complete by: As directed    No driving for 2 weeks   Increase activity slowly   Complete by: As directed    Lifting restrictions   Complete by: As directed    No heavy lifting for 6 weeks   Sexual Activity Restrictions   Complete by: As directed    No sexual activity for 8 weeks     Allergies as of 02/28/2020      Reactions   Lamictal [lamotrigine] Rash      Medication List    TAKE these medications   albuterol 108 (90 Base) MCG/ACT inhaler Commonly known as: VENTOLIN HFA INHALE 2 PUFFS INTO THE LUNGS EVERY 6 (SIX) HOURS AS NEEDED FOR WHEEZING.   aspirin 81 MG tablet Take 81 mg by mouth daily.   B-12 PO Take by mouth.   BLOOD GLUCOSE TEST STRIPS Strp Test once a day. Pt has a onetouch verio flex meter dx e11.9   diclofenac Sodium 1 % Gel Commonly known as: VOLTAREN APPLY 4 G TOPICALLY 4 (FOUR) TIMES DAILY. What changed:   when to take this  reasons to take this   docusate sodium 100 MG capsule Commonly known as: COLACE Take 1 capsule (100 mg total) by mouth 2 (two) times daily.  fluticasone 50 MCG/ACT nasal spray Commonly known as: FLONASE SPRAY 2 SPRAYS INTO EACH NOSTRIL EVERY DAY   gabapentin 100 MG capsule Commonly known as: NEURONTIN Take 100 mg by mouth 3 (three) times daily.   hyoscyamine 0.375 MG 12 hr tablet Commonly known as: LEVBID Take 1 tablet (0.375 mg total) by mouth every 12 (twelve) hours as needed.   ibuprofen 800 MG tablet Commonly known as: ADVIL Take 1 tablet (800 mg total) by mouth every 6 (six) hours.   meclizine 25 MG tablet Commonly known as: ANTIVERT TAKE 1 TABLET (25 MG TOTAL) BY MOUTH 2 (TWO) TIMES DAILY AS NEEDED FOR DIZZINESS.   Melatonin 5 MG Caps Take 5 mg by mouth at bedtime as needed (sleep).   multivitamin  tablet Take 1 tablet daily by mouth.   omeprazole 20 MG capsule Commonly known as: PRILOSEC TAKE 1 CAPSULE (20 MG TOTAL) BY MOUTH 2 (TWO) TIMES DAILY BEFORE A MEAL.   ondansetron 4 MG disintegrating tablet Commonly known as: Zofran ODT Take 1 tablet (4 mg total) by mouth every 8 (eight) hours as needed for nausea or vomiting (30-60 minutes before breakfast and dinner).   onetouch ultrasoft lancets Test once a day. Dx E11.9 pt has onetouch verio flex meter   oxyCODONE-acetaminophen 5-325 MG tablet Commonly known as: PERCOCET/ROXICET Take 1 tablet by mouth every 6 (six) hours as needed for moderate pain or severe pain.   Ozempic (0.25 or 0.5 MG/DOSE) 2 MG/1.5ML Sopn Generic drug: Semaglutide(0.25 or 0.5MG /DOS) INJECT 0.5 MG INTO THE SKIN ONCE A WEEK.   sulfamethoxazole-trimethoprim 800-160 MG tablet Commonly known as: BACTRIM DS Take 1 tablet by mouth 2 (two) times daily.   UNABLE TO FIND Med Name: vitamin b 12 500 mg daily   venlafaxine XR 75 MG 24 hr capsule Commonly known as: EFFEXOR-XR TAKE 1 CAPSULE (75 MG TOTAL) BY MOUTH DAILY WITH BREAKFAST. What changed:   how much to take  when to take this            Discharge Care Instructions  (From admission, onward)         Start     Ordered   02/28/20 0000  Discharge wound care:       Comments: Keep port sites clean and dry.   02/28/20 1149          Follow-up Information    [facility] In 2 weeks.   Contact information: [address] [place] [phone]              Signed: Lavonia Drafts 03/02/2020, 1:27 PM

## 2020-03-06 ENCOUNTER — Telehealth: Payer: Self-pay

## 2020-03-06 NOTE — Telephone Encounter (Signed)
Pt sent Mychart message stating Percocet caused her to itch all over her body. Pt states pain is tolerable and she is now taking Midol but has Ibuprofen 800 mg if needed. I advised pt to take ibuprofen for pain.Understanding was voiced.  Joliet Mallozzi l Konner Warrior, CMA

## 2020-03-12 ENCOUNTER — Ambulatory Visit (INDEPENDENT_AMBULATORY_CARE_PROVIDER_SITE_OTHER): Payer: Medicare HMO | Admitting: Obstetrics & Gynecology

## 2020-03-12 ENCOUNTER — Encounter: Payer: Self-pay | Admitting: Obstetrics & Gynecology

## 2020-03-12 ENCOUNTER — Other Ambulatory Visit: Payer: Self-pay

## 2020-03-12 VITALS — BP 135/88 | HR 99 | Wt 236.0 lb

## 2020-03-12 DIAGNOSIS — Z9889 Other specified postprocedural states: Secondary | ICD-10-CM

## 2020-03-12 NOTE — Progress Notes (Signed)
History:  59 y.o.LMP here today for 2 week post op check.Pt is s/p RATH with bilateral salpingectomy on 02/28/2020.  Pt reports that she is doing well. She is eating and passing stools without difficulty.     The following portions of the patient's history were reviewed and updated as appropriate: allergies, current medications, past family history, past medical history, past social history, past surgical history and problem list.  Review of Systems:  Pertinent items are noted in HPI.    Objective:  Physical Exam LMP 06/23/2014   CONSTITUTIONAL: Well-developed, well-nourished female in no acute distress.  HENT:  Normocephalic, atraumatic EYES: Conjunctivae and EOM are normal. No scleral icterus.  NECK: Normal range of motion SKIN: Skin is warm and dry. No rash noted. Not diaphoretic.No pallor. [place]: Alert and oriented to person, place, and time. Normal coordination.  Abd: Soft, nontender and nondistended; her port sites are healing well. .  Pelvic: deferred  Labs and Imaging Surg path 02/28/2020 UTERUS, CERVIX, BILATERAL FALLOPIAN TUBES AND OVARIES:  - Uterus:    Endometrium: Inactive endometrium. No hyperplasia or malignancy.    Myometrium: Leiomyomata. No malignancy.    Serosa: Unremarkable. No malignancy.  - Cervix: Benign squamous and endocervical mucosa. No dysplasia or  malignancy.  - Bilateral ovaries: Inclusion cyst. No malignancy.  - Bilateral fallopian tubes: Unremarkable. No malignancy.    Assessment & Plan:  2 week post op check following [place] with bilateral salpingectomy.   Doing well  Reviewed her surg path.   Reviewed post op instructions and activities  Gradual increase in activities  F/u in 4 weeks or sooner prn  Reviewed no intercourse for 8 weeks post op  All questions answered.   Jilliana Burkes L. Harraway-Smith, M.D., Cherlynn June

## 2020-03-15 DIAGNOSIS — F330 Major depressive disorder, recurrent, mild: Secondary | ICD-10-CM | POA: Diagnosis not present

## 2020-03-15 DIAGNOSIS — R69 Illness, unspecified: Secondary | ICD-10-CM | POA: Diagnosis not present

## 2020-03-15 DIAGNOSIS — F4312 Post-traumatic stress disorder, chronic: Secondary | ICD-10-CM | POA: Diagnosis not present

## 2020-03-22 ENCOUNTER — Encounter: Payer: Self-pay | Admitting: Family Medicine

## 2020-03-22 ENCOUNTER — Other Ambulatory Visit (INDEPENDENT_AMBULATORY_CARE_PROVIDER_SITE_OTHER): Payer: Medicare HMO

## 2020-03-22 ENCOUNTER — Telehealth (INDEPENDENT_AMBULATORY_CARE_PROVIDER_SITE_OTHER): Payer: Medicare HMO | Admitting: Family Medicine

## 2020-03-22 VITALS — BP 135/88 | Temp 98.0°F | Wt 236.0 lb

## 2020-03-22 DIAGNOSIS — R52 Pain, unspecified: Secondary | ICD-10-CM | POA: Diagnosis not present

## 2020-03-22 DIAGNOSIS — R6883 Chills (without fever): Secondary | ICD-10-CM

## 2020-03-22 DIAGNOSIS — J029 Acute pharyngitis, unspecified: Secondary | ICD-10-CM | POA: Diagnosis not present

## 2020-03-22 DIAGNOSIS — R519 Headache, unspecified: Secondary | ICD-10-CM

## 2020-03-22 LAB — POC COVID19 BINAXNOW: SARS Coronavirus 2 Ag: NEGATIVE

## 2020-03-22 LAB — POCT INFLUENZA A/B
Influenza A, POC: NEGATIVE
Influenza B, POC: NEGATIVE

## 2020-03-22 NOTE — Progress Notes (Signed)
° °  Subjective:  Documentation for virtual audio and video telecommunications through Caregility encounter:  The patient was located at home. 2 patient identifiers used.  The provider was located in the office. The patient did consent to this visit and is aware of possible charges through their insurance for this visit.  The other persons participating in this telemedicine service were none. Time spent on call was 14 minutes and in review of previous records >20 minutes total.  This virtual service is not related to other E/M service within previous 7 days.   Patient ID: [name], female    DOB: 10/16/1960, 59 y.o.   MRN: [id number]  HPI Chief Complaint  Patient presents with   sick    Headache, weakness, chills, sore throat, ear pain fatigue, cough, started Tuesday night   Complains of a 3 day history of chills, sore throat, headache, post nasal drainage, and cough. Tuesday night 7pm. States symptoms worsened yesterday. Starting feeling weak with bodyaches, headache, and nauseated. Reports 3 episodes of diarrhea but that has resolved.   No fever, shortness of breath, abdominal pain, N/V/D  Sore throat has improved.   She has been taking Tylenol and Mucinex.   Reviewed allergies, medications, past medical, surgical, family, and social history.    Review of Systems Pertinent positives and negatives in the history of present illness.     Objective:   Physical Exam BP 135/88    Temp 98 F (36.7 C)    Wt 236 lb (107 kg)    LMP 06/23/2014    BMI 36.96 kg/m   Alert and oriented in no acute distress.  Respirations unlabored.  Speaking in complete sentences without difficulty.      Assessment & Plan:  Chills - Plan: POC COVID-19, Influenza A/B, Novel Coronavirus, NAA ([facility])  Body aches - Plan: POC COVID-19, Influenza A/B, Novel Coronavirus, NAA ([facility])  Acute nonintractable headache, unspecified headache type - Plan: POC COVID-19, Influenza A/B, Novel  Coronavirus, NAA ([facility])  Acute pharyngitis, unspecified etiology - Plan: POC COVID-19, Influenza A/B, Novel Coronavirus, NAA ([facility])  She will come to the office for Covid and flu testing.  Discussed symptomatic treatment.  Follow-up pending results

## 2020-03-24 LAB — NOVEL CORONAVIRUS, NAA: SARS-CoV-2, NAA: DETECTED — AB

## 2020-03-24 LAB — SARS-COV-2, NAA 2 DAY TAT

## 2020-03-25 ENCOUNTER — Encounter: Payer: Self-pay | Admitting: Nurse Practitioner

## 2020-03-25 ENCOUNTER — Telehealth: Payer: Self-pay | Admitting: Nurse Practitioner

## 2020-03-25 DIAGNOSIS — J45909 Unspecified asthma, uncomplicated: Secondary | ICD-10-CM | POA: Insufficient documentation

## 2020-03-25 DIAGNOSIS — E119 Type 2 diabetes mellitus without complications: Secondary | ICD-10-CM | POA: Insufficient documentation

## 2020-03-25 NOTE — Progress Notes (Signed)
She is positive for Covid. Please check on her and schedule her for a follow up virtual Monday if needed.

## 2020-03-25 NOTE — Telephone Encounter (Signed)
Called patient to discuss Covid symptoms and the use of casirivimab/imdevimab, a monoclonal antibody infusion for those with mild to moderate Covid symptoms and at a high risk of hospitalization.  Pt is qualified for this infusion at the [facility] due to; Specific high risk criteria : BMI > 25 and Diabetes   Message left to call back our hotline [phone].  Nicolasa Ducking, NP

## 2020-03-26 ENCOUNTER — Other Ambulatory Visit: Payer: Self-pay

## 2020-03-26 ENCOUNTER — Encounter: Payer: Self-pay | Admitting: Family Medicine

## 2020-03-26 ENCOUNTER — Telehealth (INDEPENDENT_AMBULATORY_CARE_PROVIDER_SITE_OTHER): Payer: Medicare HMO | Admitting: Family Medicine

## 2020-03-26 VITALS — Temp 98.6°F | Wt 236.0 lb

## 2020-03-26 DIAGNOSIS — R058 Other specified cough: Secondary | ICD-10-CM | POA: Diagnosis not present

## 2020-03-26 DIAGNOSIS — J4520 Mild intermittent asthma, uncomplicated: Secondary | ICD-10-CM

## 2020-03-26 DIAGNOSIS — U071 COVID-19: Secondary | ICD-10-CM | POA: Diagnosis not present

## 2020-03-26 DIAGNOSIS — G933 Postviral fatigue syndrome: Secondary | ICD-10-CM | POA: Diagnosis not present

## 2020-03-26 DIAGNOSIS — G9331 Postviral fatigue syndrome: Secondary | ICD-10-CM

## 2020-03-26 MED ORDER — BENZONATATE 200 MG PO CAPS: 200.0000 mg | ORAL_CAPSULE | Freq: Two times a day (BID) | ORAL | 0 refills | Status: DC | PRN

## 2020-03-26 MED ORDER — AZITHROMYCIN 250 MG PO TABS: ORAL_TABLET | ORAL | 0 refills | Status: DC

## 2020-03-26 MED ORDER — BUDESONIDE-FORMOTEROL FUMARATE 160-4.5 MCG/ACT IN AERO: 2.0000 | INHALATION_SPRAY | Freq: Two times a day (BID) | RESPIRATORY_TRACT | 0 refills | Status: DC

## 2020-03-26 NOTE — Progress Notes (Signed)
   Subjective:  Documentation for virtual audio and video telecommunications through Caregility encounter:  The patient was located at home. 2 patient identifiers used.  The provider was located in the office. The patient did consent to this visit and is aware of possible charges through their insurance for this visit.  The other persons participating in this telemedicine service were none. Time spent on call was 25 minutes and in review of previous records >30 minutes total.  This virtual service is not related to other E/M service within previous 7 days.   Patient ID: [name], female    DOB: 10-30-1960, 60 y.o.   MRN: [id number]  HPI Chief Complaint  Patient presents with  . Covid Positive    Covid positive, body aches,weakness, cough, no fever, some sob. having ear pain since last wednesday   Complains of worsening symptoms with positive Covid test result on 03/22/2020, 5 days ago. Symptoms began on 03/20/2020.  States she is having fatigue, generalized weakness, body aches, and right ear pain. States she is still coughing a lot and it is productive of yellowish phlegm. Intermittent shortness of breath with coughing.   States she is somewhat improved since onset of symptoms.  She is still recovering from hysterectomy which was on 02/28/2020.   No longer having fever, chills, abdominal pain, N/V/D.    Taking Mucinex  Received Pfizer vaccines in April and May 2021. Did not get her booster.    Review of Systems Pertinent positives and negatives in the history of present illness.     Objective:   Physical Exam Temp 98.6 F (37 C)   Wt 236 lb (107 kg)   LMP 06/23/2014   BMI 36.96 kg/m   Alert and oriented and in no acute distress. Respirations unlabored. Speaking in complete sentences without difficulty.       Assessment & Plan:  COVID-19 virus infection  Mild intermittent asthma without complication - Plan: budesonide-formoterol (SYMBICORT) 160-4.5  MCG/ACT inhaler  Postviral fatigue syndrome  Productive cough - Plan: azithromycin (ZITHROMAX) 250 MG tablet  I will treat her with an antibiotic, steroid inhaler and Tessalon Perles. Continue with symptomatic treatment.  Opted to avoid oral steroids due to recent surgery and still healing.  Follow up as needed. She will not go to work for the full 10 days and not the new CDC guidelines of 5 days since she is still quite symptomatic.

## 2020-04-11 ENCOUNTER — Other Ambulatory Visit: Payer: Self-pay | Admitting: Family Medicine

## 2020-04-11 DIAGNOSIS — J4520 Mild intermittent asthma, uncomplicated: Secondary | ICD-10-CM

## 2020-04-13 ENCOUNTER — Other Ambulatory Visit: Payer: Self-pay

## 2020-04-13 ENCOUNTER — Encounter: Payer: Self-pay | Admitting: Obstetrics & Gynecology

## 2020-04-13 ENCOUNTER — Ambulatory Visit (INDEPENDENT_AMBULATORY_CARE_PROVIDER_SITE_OTHER): Payer: Medicare HMO | Admitting: Obstetrics & Gynecology

## 2020-04-13 VITALS — BP 145/90 | HR 101 | Wt 233.0 lb

## 2020-04-13 DIAGNOSIS — Z9889 Other specified postprocedural states: Secondary | ICD-10-CM

## 2020-04-13 NOTE — Progress Notes (Signed)
History:  60 y.o.LMP here today for 6 week post op check.Pt is s/p RATH with bilateral salpingectomy on 02/28/2020.  Pt reports that she is doing well. She is eating and passing stols without difficulty.   Pt tested positive for COVID Dec 27th.   The following portions of the patient's history were reviewed and updated as appropriate: allergies, current medications, past family history, past medical history, past social history, past surgical history and problem list.  Review of Systems:  Pertinent items are noted in HPI.    Objective:  Physical Exam BP (!) 145/90   Pulse (!) 101   Wt 233 lb (105.7 kg)   LMP 06/23/2014   BMI 36.49 kg/m   CONSTITUTIONAL: Well-developed, well-nourished female in no acute distress.  HENT:  Normocephalic, atraumatic EYES: Conjunctivae and EOM are normal. No scleral icterus.  NECK: Normal range of motion SKIN: Skin is warm and dry. No rash noted. Not diaphoretic.No pallor. [place]: Alert and oriented to person, place, and time. Normal coordination.  Abd: Soft, nontender and nondistended; her port sites are healing well. .  Pelvic: deferred  Labs and Imaging Surg path 02/28/2020 UTERUS, CERVIX, BILATERAL FALLOPIAN TUBES AND OVARIES:  - Uterus:    Endometrium: Inactive endometrium. No hyperplasia or malignancy.    Myometrium: Leiomyomata. No malignancy.    Serosa: Unremarkable. No malignancy.  - Cervix: Benign squamous and endocervical mucosa. No dysplasia or  malignancy.  - Bilateral ovaries: Inclusion cyst. No malignancy.  - Bilateral fallopian tubes: Unremarkable. No malignancy.    Assessment & Plan:  6 week post op check following [place] with bilateral salpingectomy.   Doing well  Reviewed her surg path.   Reviewed post op instructions and activities  Gradual increase in activities  F/u in 3 months or sooner prn  Reviewed no intercourse for 8 weeks post op  All questions answered.   Xzavien Harada L. Harraway-Smith, M.D., Cherlynn June

## 2020-04-14 ENCOUNTER — Other Ambulatory Visit: Payer: Self-pay | Admitting: Family Medicine

## 2020-04-14 DIAGNOSIS — J4520 Mild intermittent asthma, uncomplicated: Secondary | ICD-10-CM

## 2020-04-14 DIAGNOSIS — H8110 Benign paroxysmal vertigo, unspecified ear: Secondary | ICD-10-CM

## 2020-04-16 NOTE — Telephone Encounter (Signed)
Left message for pt to call me back 

## 2020-04-16 NOTE — Telephone Encounter (Signed)
Ok to refill. Please ask how often she is needing the meclizine and if she needs it regularly she should be seen.

## 2020-04-16 NOTE — Telephone Encounter (Signed)
Ok to refill 

## 2020-04-17 NOTE — Telephone Encounter (Signed)
Pt states she has recently had to take this a lot due to ear pain and dizziness since covid. She is waiting to have a cat scan done with Dentist and if she continues to have this she will schedule

## 2020-05-04 ENCOUNTER — Other Ambulatory Visit: Payer: Self-pay | Admitting: Family Medicine

## 2020-05-04 DIAGNOSIS — J4520 Mild intermittent asthma, uncomplicated: Secondary | ICD-10-CM

## 2020-05-04 NOTE — Telephone Encounter (Signed)
Is this okay to refill? 

## 2020-05-04 NOTE — Telephone Encounter (Signed)
Pt takes this 2-3 times a week since due to covid. She used to be on symbiort but not on a preventative anymore. She does not need a refill

## 2020-05-04 NOTE — Telephone Encounter (Signed)
Looks like I refilled this for her 3 weeks ago. Please find out how often she is using it for her asthma. Is she on a daily preventive inhaler?

## 2020-05-08 ENCOUNTER — Other Ambulatory Visit: Payer: Self-pay | Admitting: Family Medicine

## 2020-05-14 DIAGNOSIS — R69 Illness, unspecified: Secondary | ICD-10-CM | POA: Diagnosis not present

## 2020-05-14 DIAGNOSIS — F4312 Post-traumatic stress disorder, chronic: Secondary | ICD-10-CM | POA: Diagnosis not present

## 2020-05-14 DIAGNOSIS — F330 Major depressive disorder, recurrent, mild: Secondary | ICD-10-CM | POA: Diagnosis not present

## 2020-05-16 ENCOUNTER — Other Ambulatory Visit: Payer: Self-pay | Admitting: Family Medicine

## 2020-05-16 DIAGNOSIS — E1165 Type 2 diabetes mellitus with hyperglycemia: Secondary | ICD-10-CM

## 2020-05-18 ENCOUNTER — Other Ambulatory Visit: Payer: Self-pay

## 2020-05-18 ENCOUNTER — Encounter: Payer: Self-pay | Admitting: Family Medicine

## 2020-05-18 ENCOUNTER — Ambulatory Visit (INDEPENDENT_AMBULATORY_CARE_PROVIDER_SITE_OTHER): Payer: Medicare HMO | Admitting: Family Medicine

## 2020-05-18 VITALS — BP 120/70 | HR 77 | Temp 97.7°F | Wt 233.4 lb

## 2020-05-18 DIAGNOSIS — M5441 Lumbago with sciatica, right side: Secondary | ICD-10-CM | POA: Diagnosis not present

## 2020-05-18 DIAGNOSIS — S29012A Strain of muscle and tendon of back wall of thorax, initial encounter: Secondary | ICD-10-CM | POA: Diagnosis not present

## 2020-05-18 MED ORDER — METHOCARBAMOL 500 MG PO TABS: 500.0000 mg | ORAL_TABLET | Freq: Three times a day (TID) | ORAL | 0 refills | Status: DC | PRN
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
## 2020-05-18 NOTE — Patient Instructions (Signed)
Go to [facility] imaging for an x-ray of your low back  Continue using heat and you may want to try a topical pain medication with lidocaine.  You can take the muscle relaxant as needed but be aware that it may cause sedation.  We are referring you to physical therapy. If you have not heard from them in 1 week, let us know.

## 2020-05-21 ENCOUNTER — Ambulatory Visit
Admission: RE | Admit: 2020-05-21 | Discharge: 2020-05-21 | Disposition: A | Discharge status: Home / Self Care | Payer: Medicare HMO | Source: Ambulatory Visit | Attending: Family Medicine | Admitting: Family Medicine

## 2020-05-21 DIAGNOSIS — M545 Low back pain, unspecified: Secondary | ICD-10-CM | POA: Diagnosis not present

## 2020-05-21 DIAGNOSIS — M5441 Lumbago with sciatica, right side: Secondary | ICD-10-CM

## 2020-06-06 ENCOUNTER — Telehealth: Payer: Self-pay | Admitting: Internal Medicine

## 2020-06-06 NOTE — Telephone Encounter (Signed)
[facility] has contacted patient multiple times to schedule. I will send mychart message

## 2020-06-08 ENCOUNTER — Telehealth (INDEPENDENT_AMBULATORY_CARE_PROVIDER_SITE_OTHER): Payer: Medicare HMO | Admitting: Family Medicine

## 2020-06-08 ENCOUNTER — Other Ambulatory Visit: Payer: Self-pay

## 2020-06-08 ENCOUNTER — Encounter: Payer: Self-pay | Admitting: Family Medicine

## 2020-06-08 VITALS — Ht 67.0 in | Wt 233.0 lb

## 2020-06-08 DIAGNOSIS — K3189 Other diseases of stomach and duodenum: Secondary | ICD-10-CM

## 2020-06-08 NOTE — Progress Notes (Signed)
   Subjective:    Patient ID: [name], female    DOB: 1960/06/29, 60 y.o.   MRN: [id number]  HPI Documentation for virtual audio and video telecommunications through [place] encounter:  The patient was located at home. 2 patient identifiers used.  The provider was located in the office. The patient did consent to this visit and is aware of possible charges through their insurance for this visit. The other persons participating in this telemedicine service were none. Time spent on call was 5 minutes and in review of previous records >20 minutes total for counseling and coordination of care. This virtual service is not related to other E/M service within previous 7 days. She complains of a several day history of cramping abdominal pain, nausea and weakness when she has the pain.  This usually occurs after she eats.  She has tried OTC meds including Prilosec without much success.  She does have Levbid that she has used for other GI problems in the past but has not tried it with this. Review of Systems     Objective:   Physical Exam Alert and in no distress otherwise not examined       Assessment & Plan:  Spasm of GI tract I explained that I thought her symptoms were GI spasm related and strongly encouraged her to try Levbid.  Keep taking the Prilosec as well and call if continued difficulty.  She was comfortable with that

## 2020-06-10 ENCOUNTER — Other Ambulatory Visit: Payer: Self-pay | Admitting: Family Medicine

## 2020-06-25 ENCOUNTER — Encounter: Payer: Self-pay | Admitting: Internal Medicine

## 2020-06-27 ENCOUNTER — Other Ambulatory Visit: Payer: Self-pay

## 2020-06-27 ENCOUNTER — Emergency Department (HOSPITAL_COMMUNITY): Payer: Medicare HMO

## 2020-06-27 ENCOUNTER — Encounter: Payer: Self-pay | Admitting: Family Medicine

## 2020-06-27 ENCOUNTER — Ambulatory Visit (INDEPENDENT_AMBULATORY_CARE_PROVIDER_SITE_OTHER): Payer: Medicare HMO | Admitting: Family Medicine

## 2020-06-27 ENCOUNTER — Emergency Department (HOSPITAL_COMMUNITY)
Admission: EM | Admit: 2020-06-27 | Discharge: 2020-06-28 | Disposition: A | Discharge status: Home / Self Care | Payer: Medicare HMO | Attending: Emergency Medicine | Admitting: Emergency Medicine

## 2020-06-27 VITALS — BP 140/96 | HR 103 | Temp 98.6°F | Resp 18 | Wt 229.4 lb

## 2020-06-27 DIAGNOSIS — E1165 Type 2 diabetes mellitus with hyperglycemia: Secondary | ICD-10-CM

## 2020-06-27 DIAGNOSIS — R0602 Shortness of breath: Secondary | ICD-10-CM | POA: Diagnosis not present

## 2020-06-27 DIAGNOSIS — R911 Solitary pulmonary nodule: Secondary | ICD-10-CM | POA: Diagnosis not present

## 2020-06-27 DIAGNOSIS — J45909 Unspecified asthma, uncomplicated: Secondary | ICD-10-CM | POA: Insufficient documentation

## 2020-06-27 DIAGNOSIS — N181 Chronic kidney disease, stage 1: Secondary | ICD-10-CM | POA: Diagnosis not present

## 2020-06-27 DIAGNOSIS — Z8249 Family history of ischemic heart disease and other diseases of the circulatory system: Secondary | ICD-10-CM

## 2020-06-27 DIAGNOSIS — Z79899 Other long term (current) drug therapy: Secondary | ICD-10-CM | POA: Insufficient documentation

## 2020-06-27 DIAGNOSIS — Z7951 Long term (current) use of inhaled steroids: Secondary | ICD-10-CM | POA: Diagnosis not present

## 2020-06-27 DIAGNOSIS — E1140 Type 2 diabetes mellitus with diabetic neuropathy, unspecified: Secondary | ICD-10-CM | POA: Diagnosis not present

## 2020-06-27 DIAGNOSIS — R42 Dizziness and giddiness: Secondary | ICD-10-CM | POA: Insufficient documentation

## 2020-06-27 DIAGNOSIS — R002 Palpitations: Secondary | ICD-10-CM

## 2020-06-27 DIAGNOSIS — R079 Chest pain, unspecified: Secondary | ICD-10-CM

## 2020-06-27 DIAGNOSIS — E785 Hyperlipidemia, unspecified: Secondary | ICD-10-CM | POA: Insufficient documentation

## 2020-06-27 DIAGNOSIS — R5383 Other fatigue: Secondary | ICD-10-CM | POA: Diagnosis not present

## 2020-06-27 DIAGNOSIS — E1169 Type 2 diabetes mellitus with other specified complication: Secondary | ICD-10-CM | POA: Diagnosis not present

## 2020-06-27 DIAGNOSIS — R030 Elevated blood-pressure reading, without diagnosis of hypertension: Secondary | ICD-10-CM

## 2020-06-27 DIAGNOSIS — R202 Paresthesia of skin: Secondary | ICD-10-CM | POA: Diagnosis not present

## 2020-06-27 DIAGNOSIS — E1122 Type 2 diabetes mellitus with diabetic chronic kidney disease: Secondary | ICD-10-CM | POA: Diagnosis not present

## 2020-06-27 DIAGNOSIS — R072 Precordial pain: Secondary | ICD-10-CM

## 2020-06-27 DIAGNOSIS — R0789 Other chest pain: Secondary | ICD-10-CM | POA: Diagnosis not present

## 2020-06-27 LAB — BASIC METABOLIC PANEL
Anion gap: 7 (ref 5–15)
BUN: 8 mg/dL (ref 6–20)
CO2: 29 mmol/L (ref 22–32)
Calcium: 9.6 mg/dL (ref 8.9–10.3)
Chloride: 105 mmol/L (ref 98–111)
Creatinine, Ser: 0.79 mg/dL (ref 0.44–1.00)
GFR, Estimated: >60 mL/min (ref >60)
Glucose, Bld: 94 mg/dL (ref 70–99)
Potassium: 3.6 mmol/L (ref 3.5–5.1)
Sodium: 141 mmol/L (ref 135–145)

## 2020-06-27 LAB — GLUCOSE, POCT (MANUAL RESULT ENTRY): POC Glucose: 119 mg/dl — AB (ref 70–99)

## 2020-06-27 LAB — CBC
HCT: 41.3 % (ref 36.0–46.0)
Hemoglobin: 13.3 g/dL (ref 12.0–15.0)
MCH: 25.4 pg — ABNORMAL LOW (ref 26.0–34.0)
MCHC: 32.2 g/dL (ref 30.0–36.0)
MCV: 79 fL — ABNORMAL LOW (ref 80.0–100.0)
Platelets: 266 10*3/uL (ref 150–400)
RBC: 5.23 MIL/uL — ABNORMAL HIGH (ref 3.87–5.11)
RDW: 14.4 % (ref 11.5–15.5)
WBC: 8.3 10*3/uL (ref 4.0–10.5)
nRBC: 0 % (ref 0.0–0.2)

## 2020-06-27 LAB — POC COVID19 BINAXNOW: SARS Coronavirus 2 Ag: NEGATIVE

## 2020-06-27 LAB — POCT GLYCOSYLATED HEMOGLOBIN (HGB A1C): Hemoglobin A1C: 6.6 % — AB (ref 4.0–5.6)

## 2020-06-27 LAB — TROPONIN I (HIGH SENSITIVITY)
Troponin I (High Sensitivity): 4 ng/L (ref <18)
Troponin I (High Sensitivity): 4 ng/L (ref <18)
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
## 2020-06-27 NOTE — ED Notes (Signed)
Called pt x3 for vitals, no response. 

## 2020-06-27 NOTE — ED Provider Notes (Signed)
Patient placed in Quick Look pathway, seen and evaluated   Chief Complaint: chest pain radiates to her neck sent here by primary MD  HPI:   weakness  ROS: no fever  Physical Exam:   Gen: No distress  Neuro: Awake and Alert  Skin: Warm    Focused Exam: Lungs clear Heart rrr   Initiation of care has begun. The patient has been counseled on the process, plan, and necessity for staying for the completion/evaluation, and the remainder of the medical screening examination   [name] 06/27/20 1542    Carmin Muskrat, MD 06/28/20 [phone]

## 2020-06-27 NOTE — Addendum Note (Signed)
Addended by: Minette Headland A on: 06/27/2020 04:16 PM   Modules accepted: Orders

## 2020-06-27 NOTE — ED Triage Notes (Signed)
Pt BIB EMS for dizziness for a week. Pt states her BP has been elevated, but denies a history of hypertension and does not take medications for it. Pt states on and off right chest and jaw pain as well.

## 2020-06-28 ENCOUNTER — Encounter (HOSPITAL_COMMUNITY): Payer: Self-pay | Admitting: Emergency Medicine

## 2020-06-28 ENCOUNTER — Emergency Department (HOSPITAL_COMMUNITY): Payer: Medicare HMO

## 2020-06-28 DIAGNOSIS — R079 Chest pain, unspecified: Secondary | ICD-10-CM | POA: Diagnosis not present

## 2020-06-28 DIAGNOSIS — R0602 Shortness of breath: Secondary | ICD-10-CM | POA: Diagnosis not present

## 2020-06-28 DIAGNOSIS — R911 Solitary pulmonary nodule: Secondary | ICD-10-CM | POA: Diagnosis not present

## 2020-06-28 DIAGNOSIS — R42 Dizziness and giddiness: Secondary | ICD-10-CM | POA: Diagnosis not present

## 2020-06-28 MED ORDER — KETOROLAC TROMETHAMINE 60 MG/2ML IM SOLN
60.0000 mg | Freq: Once | INTRAMUSCULAR | Status: DC
  Filled 2020-06-28: qty 2

## 2020-06-28 MED ORDER — ALUM & MAG HYDROXIDE-SIMETH 200-200-20 MG/5ML PO SUSP
30.0000 mL | Freq: Once | ORAL | Status: AC
  Administered 2020-06-28: 30 mL via ORAL
  Filled 2020-06-28 (×2): qty 30

## 2020-06-28 MED ORDER — IOHEXOL 350 MG/ML SOLN
80.0000 mL | Freq: Once | INTRAVENOUS | Status: AC | PRN
  Administered 2020-06-28: 80 mL via INTRAVENOUS

## 2020-06-28 MED ORDER — KETOROLAC TROMETHAMINE 30 MG/ML IJ SOLN
30.0000 mg | Freq: Once | INTRAMUSCULAR | Status: AC
  Administered 2020-06-28: 30 mg via INTRAVENOUS
  Filled 2020-06-28: qty 1

## 2020-06-28 NOTE — ED Provider Notes (Addendum)
[facility] EMERGENCY DEPARTMENT Provider Note   CSN: [id number] Arrival date & time: 06/27/20  1535     History Chief Complaint  Patient presents with  . Dizziness    [name] is a 60 y.o. female.  The history is provided by the patient.  Dizziness Quality:  Lightheadedness Severity:  Moderate Onset quality:  Gradual Timing:  Constant Progression:  Unchanged Chronicity:  New Context: not when bending over, not with bowel movement, not with ear pain, not with eye movement, not with head movement, not with inactivity and not with loss of consciousness   Relieved by:  Nothing Worsened by:  Nothing Ineffective treatments:  None tried Associated symptoms: chest pain   Associated symptoms: no blood in stool, no diarrhea, no headaches, no hearing loss, no nausea, no palpitations, no shortness of breath, no syncope, no tinnitus, no vision changes, no vomiting and no weakness   Risk factors: no anemia and no hx of vertigo   Noted that BP has been elevated for about a week.  Also with lightheadedness and 2 days of chest pain.  No weakness, no numbness.  No DOE.  No exertional symptoms.  No travel.  No leg pain or swelling.      Past Medical History:  Diagnosis Date  . Abdominal pain    due to fibroids  . Anxiety   . Arthritis    hips and knees  . Asthma    stress induced asthma  . Chest pain 06/2019  . Chlamydia   . Chronic kidney disease    ckd stage 1 per 5-26-201 dr Cecille Rubin [facility] ov note  . Depression   . DM type 2 (diabetes mellitus, type 2) ([place]) dx jan 2021  . Fibroid    uterine  . GERD (gastroesophageal reflux disease)   . Hepatic steatosis 12/05/2019  . Hyperlipidemia   . IBS (irritable bowel syndrome)   . Liver nodule 1999   no follow up needed was due to stress  . Morbid obesity ([place])   . Neuropathy    both fingers and toes at times  . Obesity   . Protein in urine 03/2019  . Trichomonas   . Tubular adenoma of colon 01/2012     Patient Active Problem List   Diagnosis Date Noted  . Family history of pulmonary embolism 06/27/2020  . Morbid obesity ([place])   . Asthma   . DM type 2 (diabetes mellitus, type 2) ([place])   . Post-operative state 02/28/2020  . Pelvic pain 02/28/2020  . Hepatic steatosis 12/05/2019  . Major depression, recurrent, chronic ([place]) 07/07/2019  . Abnormal EKG 07/07/2019  . Benign paroxysmal positional vertigo 06/23/2019  . Nausea 06/23/2019  . Type 2 diabetes mellitus with hyperglycemia, without long-term current use of insulin ([place]) 06/23/2019  . Sensation of fullness in right ear 06/23/2019  . Postmenopausal bleeding 12/07/2018  . Panic attacks 06/11/2018  . Mild intermittent asthma without complication 63/03/6008  . Prediabetes 01/28/2017  . Obesity (BMI 30-39.9) 01/28/2017  . Adjustment disorder with anxiety 09/20/2016  . Fibroid 03/21/2016  . Dyslipidemia 05/31/2012  . VAGINAL PRURITUS 01/11/2010  . KNEE PAIN, LEFT 07/20/2009  . Enlarged lymph nodes 07/20/2009  . INSOMNIA 02/06/2009  . ANEMIA, MILD 11/16/2008  . Gastroesophageal reflux disease 08/30/2008  . DIZZINESS 08/30/2008  . BIPOLAR DISORDER UNSPECIFIED 07/27/2007  . Anxiety and depression 10/29/2006  . DISORDER, DEPRESSIVE NEC 10/29/2006  . ALLERGIC RHINITIS 10/29/2006  . IRRITABLE BOWEL SYNDROME 10/29/2006  . HYPERCHOLESTEROLEMIA, MILD  09/01/2005  . DISORDER, LIVER NOS 08/02/2002    Past Surgical History:  Procedure Laterality Date  . colonscopy  2017  . COLPOSCOPY  1990   results were normal  . CYSTOSCOPY  02/28/2020   Procedure: CYSTOSCOPY;  Surgeon: Lavonia Drafts, MD;  Location: [facility];  Service: Gynecology;;  . Murrell Redden & CURRETTAGE/HYSTROSCOPY WITH NOVASURE ABLATION N/A 08/23/2019   Procedure: DILATATION & CURETTAGE/DIAGNOSTIC HYSTEROSCOPY;  Surgeon: Lavonia Drafts, MD;  Location: [facility];  Service: Gynecology;  Laterality: N/A;  . IR GENERIC  HISTORICAL  03/21/2016   IR EMBO TUMOR ORGAN ISCHEMIA INFARCT INC GUIDE ROADMAPPING 03/21/2016 Arne Cleveland, MD [facility]  . IR GENERIC HISTORICAL  03/21/2016   IR US GUIDE VASC ACCESS RIGHT 03/21/2016 Arne Cleveland, MD [facility]  . IR GENERIC HISTORICAL  03/21/2016   IR ANGIOGRAM SELECTIVE EACH ADDITIONAL VESSEL 03/21/2016 Arne Cleveland, MD [facility]  . IR GENERIC HISTORICAL  03/21/2016   IR ANGIOGRAM SELECTIVE EACH ADDITIONAL VESSEL 03/21/2016 Arne Cleveland, MD [facility]  . IR GENERIC HISTORICAL  03/21/2016   IR ANGIOGRAM PELVIS SELECTIVE OR SUPRASELECTIVE 03/21/2016 Arne Cleveland, MD [facility]  . IR GENERIC HISTORICAL  03/21/2016   IR ANGIOGRAM PELVIS SELECTIVE OR SUPRASELECTIVE 03/21/2016 Arne Cleveland, MD [facility]  . IR GENERIC HISTORICAL  02/27/2016   IR RADIOLOGIST EVAL & MGMT 02/27/2016 Arne Cleveland, MD [facility]  . IR GENERIC HISTORICAL  04/03/2016   IR RADIOLOGIST EVAL & MGMT 04/03/2016 Arne Cleveland, MD [facility]  . IR RADIOLOGIST EVAL & MGMT  08/26/2016  . labial cyst removal    . ROBOTIC ASSISTED TOTAL HYSTERECTOMY Bilateral 02/28/2020   Procedure: XI ROBOTIC ASSISTED TOTAL HYSTERECTOMY AND BILATERAL SALPINGECTOMY;  Surgeon: Lavonia Drafts, MD;  Location: [facility];  Service: Gynecology;  Laterality: Bilateral;  . TUBAL LIGATION     20 years ago     OB History    Gravida  4   Para  2   Term  2   Preterm  0   AB  2   Living  2     SAB  2   IAB  0   Ectopic      Multiple      Live Births  2           Family History  Problem Relation Age of Onset  . Diabetes Father   . Diabetes Mother   . Hypertension Mother   . Deep vein thrombosis Mother   . Cancer - Other Mother        peritoneal cancer  . Hiatal hernia Mother   . Colon cancer Maternal Grandmother   . Colon polyps Maternal Grandmother   . Hypertension Sister   . Deep vein thrombosis Sister   . Diabetes Sister   .  Hiatal hernia Sister   . Rectal cancer Neg Hx   . Stomach cancer Neg Hx   . Pancreatic cancer Neg Hx   . Kidney disease Neg Hx   . Liver disease Neg Hx     Social History   Tobacco Use  . Smoking status: Never Smoker  . Smokeless tobacco: Never Used  Vaping Use  . Vaping Use: Never used  Substance Use Topics  . Alcohol use: Yes    Alcohol/week: 0.0 standard drinks    Comment: occ, not weekly  . Drug use: No    Home Medications Prior to Admission medications   Medication Sig Start Date End Date  Taking? Authorizing Provider  albuterol (VENTOLIN HFA) 108 (90 Base) MCG/ACT inhaler INHALE 2 PUFFS INTO THE LUNGS EVERY 6 (SIX) HOURS AS NEEDED FOR WHEEZING. 04/11/20   Henson, Vickie L, NP-C  aspirin 81 MG tablet Take 81 mg by mouth daily.    [provider]  clonazePAM (KLONOPIN) 0.5 MG tablet Take 0.5 mg by mouth daily as needed. 04/18/20   [provider]  Cyanocobalamin (B-12 PO) Take by mouth.    [provider]  diclofenac Sodium (VOLTAREN) 1 % GEL APPLY 4 G TOPICALLY 4 (FOUR) TIMES DAILY. Patient taking differently: Apply 4 g topically as needed. 08/02/19   Henson, Vickie L, NP-C  docusate sodium (COLACE) 100 MG capsule Take 1 capsule (100 mg total) by mouth 2 (two) times daily. 02/28/20   Lavonia Drafts, MD  fluticasone (FLONASE) 50 MCG/ACT nasal spray SPRAY 2 SPRAYS INTO EACH NOSTRIL EVERY DAY 06/11/20   Henson, Vickie L, NP-C  gabapentin (NEURONTIN) 100 MG capsule Take 100 mg by mouth 3 (three) times daily. 12/15/19   [provider]  Glucose Blood (BLOOD GLUCOSE TEST STRIPS) STRP Test once a day. Pt has a onetouch verio flex meter dx e11.9 01/03/19   Henson, Vickie L, NP-C  hyoscyamine (LEVBID) 0.375 MG 12 hr tablet Take 1 tablet (0.375 mg total) by mouth every 12 (twelve) hours as needed. 01/10/20   [name], PA  ibuprofen (ADVIL) 800 MG tablet Take 1 tablet (800 mg total) by mouth every 6 (six) hours. 02/29/20    Lavonia Drafts, MD  Lancets [facility] ULTRASOFT) lancets Test once a day. Dx E11.9 pt has onetouch verio flex meter 01/03/19   Henson, Vickie L, NP-C  meclizine (ANTIVERT) 25 MG tablet TAKE 1 TABLET (25 MG TOTAL) BY MOUTH 2 (TWO) TIMES DAILY AS NEEDED FOR DIZZINESS. 04/17/20   Henson, Vickie L, NP-C  Melatonin 5 MG CAPS Take 5 mg by mouth at bedtime as needed (sleep).  11/27/15   [provider]  methocarbamol (ROBAXIN) 500 MG tablet Take 1 tablet (500 mg total) by mouth every 8 (eight) hours as needed for muscle spasms. 05/18/20   Henson, Vickie L, NP-C  Multiple Vitamin (MULTIVITAMIN) tablet Take 1 tablet daily by mouth.    [provider]  omeprazole (PRILOSEC) 20 MG capsule TAKE 1 CAPSULE (20 MG TOTAL) BY MOUTH 2 (TWO) TIMES DAILY BEFORE A MEAL. 02/27/20   Irene Shipper, MD  ondansetron (ZOFRAN ODT) 4 MG disintegrating tablet Take 1 tablet (4 mg total) by mouth every 8 (eight) hours as needed for nausea or vomiting (30-60 minutes before breakfast and dinner). 01/10/20   Lemmon, Lavone Nian, PA  OZEMPIC, 0.25 OR 0.5 MG/DOSE, 2 MG/1.5ML SOPN INJECT 0.5 MG INTO THE SKIN ONCE A WEEK. 05/17/20   Henson, Vickie L, NP-C  SYMBICORT 160-4.5 MCG/ACT inhaler INHALE 2 PUFFS INTO THE LUNGS TWICE A DAY 04/16/20   Henson, Vickie L, NP-C  venlafaxine XR (EFFEXOR-XR) 150 MG 24 hr capsule Take by mouth daily. 05/22/20   [provider]  venlafaxine XR (EFFEXOR-XR) 75 MG 24 hr capsule TAKE 1 CAPSULE (75 MG TOTAL) BY MOUTH DAILY WITH BREAKFAST. Patient taking differently: Take 150 mg by mouth 2 (two) times daily after a meal. 03/08/18   Henson, Vickie L, NP-C    Allergies    Percocet [oxycodone-acetaminophen] and Lamictal [lamotrigine]  Review of Systems   Review of Systems  Constitutional: Negative for fever.  HENT: Negative for hearing loss and tinnitus.   Eyes: Negative for visual  disturbance.  Respiratory: Negative for shortness of breath.   Cardiovascular: Positive for  chest pain. Negative for palpitations, leg swelling and syncope.  Gastrointestinal: Negative for blood in stool, diarrhea, nausea and vomiting.  Neurological: Positive for dizziness. Negative for weakness and headaches.  All other systems reviewed and are negative.   Physical Exam Updated Vital Signs BP 136/87   Pulse (!) 101   Temp 98.2 F (36.8 C) (Oral)   Resp (!) 33   Ht 5\' 7"  (1.702 m)   Wt 103.9 kg   LMP 06/23/2014   SpO2 96%   BMI 35.87 kg/m   Physical Exam  ED Results / Procedures / Treatments   Labs (all labs ordered are listed, but only abnormal results are displayed) Results for orders placed or performed during the hospital encounter of 35/59/74  Basic metabolic panel  Result Value Ref Range   Sodium 141 135 - 145 mmol/L   Potassium 3.6 3.5 - 5.1 mmol/L   Chloride 105 98 - 111 mmol/L   CO2 29 22 - 32 mmol/L   Glucose, Bld 94 70 - 99 mg/dL   BUN 8 6 - 20 mg/dL   Creatinine, Ser 0.79 0.44 - 1.00 mg/dL   Calcium 9.6 8.9 - 10.3 mg/dL   GFR, Estimated >60 >60 mL/min   Anion gap 7 5 - 15  CBC  Result Value Ref Range   WBC 8.3 4.0 - 10.5 K/uL   RBC 5.23 (H) 3.87 - 5.11 MIL/uL   Hemoglobin 13.3 12.0 - 15.0 g/dL   HCT 41.3 36.0 - 46.0 %   MCV 79.0 (L) 80.0 - 100.0 fL   MCH 25.4 (L) 26.0 - 34.0 pg   MCHC 32.2 30.0 - 36.0 g/dL   RDW 14.4 11.5 - 15.5 %   Platelets 266 150 - 400 K/uL   nRBC 0.0 0.0 - 0.2 %  Troponin I (High Sensitivity)  Result Value Ref Range   Troponin I (High Sensitivity) 4 <18 ng/L  Troponin I (High Sensitivity)  Result Value Ref Range   Troponin I (High Sensitivity) 4 <18 ng/L   DG Chest 2 View  Result Date: 06/27/2020 CLINICAL DATA:  Dizziness for a week, elevated blood pressure, off and on RIGHT chest and jaw pain, history asthma, type II diabetes mellitus, irritable bowel syndrome EXAM: CHEST - 2 VIEW COMPARISON:  05/01/2017 FINDINGS: Normal heart size, mediastinal contours, and pulmonary vascularity. Lungs clear. No pulmonary  infiltrate, pleural effusion, or pneumothorax. Osseous structures unremarkable. IMPRESSION: No acute abnormalities. Electronically Signed   By: Lavonia Dana M.D.   On: 06/27/2020 16:32   CT Head Wo Contrast  Result Date: 06/28/2020 CLINICAL DATA:  Vertigo EXAM: CT HEAD WITHOUT CONTRAST TECHNIQUE: Contiguous axial images were obtained from the base of the skull through the vertex without intravenous contrast. COMPARISON:  None. FINDINGS: Brain: No acute intracranial abnormality. Specifically, no hemorrhage, hydrocephalus, mass lesion, acute infarction, or significant intracranial injury. Vascular: No hyperdense vessel or unexpected calcification. Skull: No acute calvarial abnormality. Sinuses/Orbits: No acute finding Other: None IMPRESSION: No acute intracranial abnormality. Electronically Signed   By: Rolm Baptise M.D.   On: 06/28/2020 01:20   Radiology DG Chest 2 View  Result Date: 06/27/2020 CLINICAL DATA:  Dizziness for a week, elevated blood pressure, off and on RIGHT chest and jaw pain, history asthma, type II diabetes mellitus, irritable bowel syndrome EXAM: CHEST - 2 VIEW COMPARISON:  05/01/2017 FINDINGS: Normal heart size, mediastinal contours, and pulmonary vascularity. Lungs clear. No pulmonary infiltrate, pleural  effusion, or pneumothorax. Osseous structures unremarkable. IMPRESSION: No acute abnormalities. Electronically Signed   By: Lavonia Dana M.D.   On: 06/27/2020 16:32   CT Head Wo Contrast  Result Date: 06/28/2020 CLINICAL DATA:  Vertigo EXAM: CT HEAD WITHOUT CONTRAST TECHNIQUE: Contiguous axial images were obtained from the base of the skull through the vertex without intravenous contrast. COMPARISON:  None. FINDINGS: Brain: No acute intracranial abnormality. Specifically, no hemorrhage, hydrocephalus, mass lesion, acute infarction, or significant intracranial injury. Vascular: No hyperdense vessel or unexpected calcification. Skull: No acute calvarial abnormality. Sinuses/Orbits: No acute  finding Other: None IMPRESSION: No acute intracranial abnormality. Electronically Signed   By: Rolm Baptise M.D.   On: 06/28/2020 01:20    Procedures Procedures   Medications Ordered in ED Medications  alum & mag hydroxide-simeth (MAALOX/MYLANTA) 200-200-20 MG/5ML suspension 30 mL (has no administration in time range)  ketorolac (TORADOL) injection 60 mg (has no administration in time range)    ED Course  I have reviewed the triage vital signs and the nursing notes.  Pertinent labs & imaging results that were available during my care of the patient were reviewed by me and considered in my medical decision making (see chart for details).    Ruled out for MI and PE, not orthostatic. HEART score is 2 low risk for ACS. Cut salt from your diet and follow up with your PMD for ongoing care. Informed verbally and on discharge paper of nodule seen on CT scan and need for follow up chest CT in 6 months.  Patient verbalizes understanding and agrees to follow up  [name] was evaluated in Emergency Department on 06/28/2020 for the symptoms described in the history of present illness. She was evaluated in the context of the global COVID-19 pandemic, which necessitated consideration that the patient might be at risk for infection with the SARS-CoV-2 virus that causes COVID-19. Institutional protocols and algorithms that pertain to the evaluation of patients at risk for COVID-19 are in a state of rapid change based on information released by regulatory bodies including the CDC and federal and state organizations. These policies and algorithms were followed during the patient's care in the ED.  Final Clinical Impression(s) / ED Diagnoses Final diagnoses:  Elevated blood pressure reading   Return for intractable cough, coughing up blood, fevers >100.4 unrelieved by medication, shortness of breath, intractable vomiting, chest pain, shortness of breath, weakness, numbness, changes in speech, facial  asymmetry, abdominal pain, passing out, Inability to tolerate liquids or food, cough, altered mental status or any concerns. No signs of systemic illness or infection. The patient is nontoxic-appearing on exam and vital signs are within normal limits.  I have reviewed the triage vital signs and the nursing notes. Pertinent labs & imaging results that were available during my care of the patient were reviewed by me and considered in my medical decision making (see chart for details). After history, exam, and medical workup I feel the patient has been appropriately medically screened and is safe for discharge home. Pertinent diagnoses were discussed with the patient. Patient was given return precautions.    Dezman Granda, MD 06/28/20 [place], Declyn Offield, MD 06/28/20 [id number]

## 2020-06-28 NOTE — ED Notes (Signed)
Patient transported to CT 

## 2020-06-29 ENCOUNTER — Encounter: Payer: Self-pay | Admitting: Internal Medicine

## 2020-07-02 ENCOUNTER — Telehealth: Payer: Self-pay | Admitting: Pharmacist

## 2020-07-02 DIAGNOSIS — T466X5A Adverse effect of antihyperlipidemic and antiarteriosclerotic drugs, initial encounter: Secondary | ICD-10-CM

## 2020-07-02 DIAGNOSIS — M791 Myalgia, unspecified site: Secondary | ICD-10-CM

## 2020-07-02 NOTE — Progress Notes (Addendum)
[facility])                                            [place] Team                                        Statin Quality Measure Assessment    07/02/2020  [name] [birth year] [id number]    Per review of chart and payor information, patient has a diagnosis of diabetes but is not currently filling a statin prescription.  This places patient into the SUPD (Statin Use In Patients with Diabetes) measure for CMS.    Patient was previously on Atorvastatin.  I spoke with her via telephone and she reported stopping it due to joint pain.   The 10-year ASCVD risk score Mikey Bussing DC Brooke Bonito., et al., 2013) is: 4.2%   Values used to calculate the score:     Age: 60 years     Sex: Female     Is Non-Hispanic African American: No     Diabetic: Yes     Tobacco smoker: No     Systolic Blood Pressure: 948 mmHg     Is BP treated: No     HDL Cholesterol: 59 mg/dL     Total Cholesterol: 157 mg/dL  Please consider ONE of the following recommendations:          ? Initiate moderate intensity          statin with reduced frequency if prior          statin intolerance 1x weekly, #13, 3 refills   2x weekly, #26, 3 refills   3x weekly, #39, 3 refills    ? Code for past statin intolerance or  other exclusions (required annually)  Provider Requirements: ? Associate code during an office visit or telehealth encounter  Drug Induced Myopathy G72.0   Myopathy, unspecified G72.9   Myositis, unspecified M60.9   Rhabdomyolysis N46.27   Alcoholic fatty liver O35.0   Cirrhosis of liver K74.69   Prediabetes R73.03   PCOS E28.2   Toxic liver disease, unspecified K71.9   Adverse effect of antihyperlipidemic and antiarteriosclerotic drugs, initial encounter [place], PharmD, [place] Clinical Pharmacist [phone]

## 2020-07-04 ENCOUNTER — Other Ambulatory Visit: Payer: Self-pay | Admitting: Family Medicine

## 2020-07-04 DIAGNOSIS — H2513 Age-related nuclear cataract, bilateral: Secondary | ICD-10-CM | POA: Diagnosis not present

## 2020-07-04 DIAGNOSIS — H35033 Hypertensive retinopathy, bilateral: Secondary | ICD-10-CM | POA: Diagnosis not present

## 2020-07-04 DIAGNOSIS — Z1231 Encounter for screening mammogram for malignant neoplasm of breast: Secondary | ICD-10-CM

## 2020-07-04 DIAGNOSIS — E119 Type 2 diabetes mellitus without complications: Secondary | ICD-10-CM | POA: Diagnosis not present

## 2020-07-04 LAB — HM DIABETES EYE EXAM

## 2020-07-10 NOTE — Progress Notes (Signed)
[name] is a 60 y.o. female who presents for annual wellness visit and follow-up on chronic medical conditions.  She has the following concerns:  She is having breast soreness bilaterally. [place] told her she needs a diagnostic mammogram and not a screening mammogram.   She is requesting a DEXA, never had one. LMP 6 years ago.   Source of her anxiety instead of the anxiety. Doing much better.   Declines Covid booster or pneumonia.    Immunization History  Administered Date(s) Administered  . Influenza Whole 02/06/2009  . Influenza,inj,Quad PF,6+ Mos 01/28/2017, 02/02/2018  . PFIZER(Purple Top)SARS-COV-2 Vaccination 07/07/2019, 07/28/2019  . Td 08/01/2005   Last Pap smear: hysterectomy Last mammogram: 2019 Last colonoscopy: 07/2017 Last DEXA: never LMP: 6 years ago  Dentist: October 2021  Ophtho: [facility] 4/22 Exercise: some walking  Other doctors caring for patient include: Dr. Marlou Porch- cardiologist  Dr. Constance Holster- ENT Dr. Ihor Dow-  OBGYN Dr. Erlinda Hong- Ortho Dr. Fuller Plan- [facility]  Therapist- [facility].  Depression screen:  See questionnaire below.  Depression screen [facility] 2/9 07/11/2020 06/27/2020 07/07/2019 02/02/2018 01/28/2017  Decreased Interest 0 1 1 1 2   Down, Depressed, Hopeless 0 1 1 1 2   PHQ - 2 Score 0 2 2 2 4   Altered sleeping - 1 1 3 2   Tired, decreased energy - 1 1 1 2   Change in appetite - 1 0 1 0  Feeling bad or failure about yourself  - 1 1 1 2   Trouble concentrating - 1 3 1 2   Moving slowly or fidgety/restless - 0 3 1 1   Suicidal thoughts - 1 0 1 2  PHQ-9 Score - 8 11 11 15   Difficult doing work/chores - Very difficult Extremely dIfficult Somewhat difficult Somewhat difficult  Some recent data might be hidden    Fall Risk Screen: see questionnaire below. Fall Risk  07/11/2020 06/27/2020 07/07/2019 02/02/2018 01/28/2017  Falls in the past year? 0 1 0 0 No  Number falls in past yr: 0 0 0 0 -  Injury with  Fall? 0 1 0 0 -  Risk for fall due to : No Fall Risks Impaired balance/gait - - -  Follow up Falls evaluation completed Falls evaluation completed - - -    ADL screen:  See questionnaire below Functional Status Survey: Is the patient deaf or have difficulty hearing?: No Does the patient have difficulty seeing, even when wearing glasses/contacts?: No Does the patient have difficulty concentrating, remembering, or making decisions?: Yes Does the patient have difficulty walking or climbing stairs?: No Does the patient have difficulty dressing or bathing?: No Does the patient have difficulty doing errands alone such as visiting a doctor's office or shopping?: No   End of Life Discussion:  Patient does not have a living will and medical power of attorney. She has documents at home and will consider filling these out. Her daughter Bethann Berkshire, her daughter will be her HCPOA.   Review of Systems Constitutional: -fever, -chills, -sweats, -unexpected weight change, -anorexia, -fatigue Allergy: -sneezing, -itching, -congestion Dermatology: denies changing moles, rash, lumps, new worrisome lesions ENT: -runny nose, -ear pain, -sore throat, -hoarseness, -sinus pain, -teeth pain, -tinnitus, -hearing loss, -epistaxis Cardiology:  -chest pain, -palpitations, -edema, -orthopnea, -paroxysmal nocturnal dyspnea Respiratory: -cough, -shortness of breath, -dyspnea on exertion, -wheezing, -hemoptysis Gastroenterology: -abdominal pain, -nausea, -vomiting, -diarrhea, -constipation, -blood in stool, -changes in bowel movement, -dysphagia Hematology: -bleeding or bruising problems Musculoskeletal: -arthralgias, -myalgias, -joint swelling, -back pain, -neck pain, -  cramping, -gait changes Ophthalmology: -vision changes, -eye redness, -itching, -discharge Urology: -dysuria, -difficulty urinating, -hematuria, -urinary frequency, -urgency, incontinence Neurology: -headache, -weakness, -tingling, -numbness, -speech  abnormality, -memory loss, -falls, -dizziness Psychology:  -depressed mood, -agitation, -sleep problems    PHYSICAL EXAM:  BP 110/62   Ht 5\' 7"  (1.702 m)   Wt 230 lb 12.8 oz (104.7 kg)   LMP 06/23/2014   BMI 36.15 kg/m   General Appearance: Alert, cooperative, no distress, appears stated age Head: Normocephalic, without obvious abnormality, atraumatic Eyes: PERRL, conjunctiva/corneas clear, EOM's intact, fundi benign Ears: Normal TM's and external ear canals Nose: Mask on Throat: Mask on Neck: Supple, no lymphadenopathy; thyroid: no enlargement/tenderness/nodules; no JVD Back: Spine nontender, no curvature, ROM normal, no CVA tenderness Lungs: Clear to auscultation bilaterally without wheezes, rales or ronchi; respirations unlabored Chest Wall: No tenderness or deformity Heart: Regular rate and rhythm, S1 and S2 normal, no murmur, rub or gallop Breast Exam: Mild tenderness noted bilaterally, masses, or nipple discharge or inversion. No axillary lymphadenopathy.  Abdomen: Soft, non-tender, nondistended, normoactive bowel sounds. Genitalia: OB/GYN Extremities: No clubbing, cyanosis or edema Pulses: 2+ and symmetric all extremities Skin: Skin color, texture, turgor normal, no rashes or lesions Lymph nodes: Cervical, supraclavicular, and axillary nodes normal Neurologic: CNII-XII intact, normal strength, sensation and gait; reflexes 2+ and symmetric throughout Psych: Normal mood, affect, hygiene and grooming.  ASSESSMENT/PLAN: Medicare annual wellness visit, subsequent -Denies any issues with falls, ADLs or memory.  Her mood has improved.  Advanced directives discussed  Routine general medical examination at a health care facility - Plan: Lipid panel -Preventive health care reviewed.  She sees her OB/GYN.  She will call and schedule her mammogram which will be diagnostic since she does have some tenderness bilaterally. Counseling on healthy lifestyle including diet and exercise.   Immunizations reviewed.  Discussed safety.  Advance directive discussed with patient -Paperwork provided  Immunization declined -Declines pneumonia vaccine.  I do recommend that she get a Tdap and Shingrix at her pharmacy  Major depression, recurrent, chronic ([place]) -Continue seeing therapist  Obesity (BMI 30-39.9) - Plan: Lipid panel -Continue to work on weight loss with a healthy diet and increasing physical activity  HYPERCHOLESTEROLEMIA, MILD - Plan: Lipid panel -Reportedly has not been taking her statin due to myalgias with atorvastatin. Check lipid panel and discussed treatment options to lower lipids.   Estrogen deficiency - Plan: DG Bone Density -This will be her first bone density study  Encounter for screening mammogram for malignant neoplasm of breast - Plan: MM DIAG BREAST TOMO BILATERAL -She will call and schedule a mammogram  Breast tenderness - Plan: MM DIAG BREAST TOMO BILATERAL     Discussed monthly self breast exams and yearly mammograms; at least 30 minutes of aerobic activity at least 5 days/week and weight-bearing exercise 2x/week; proper sunscreen use reviewed; healthy diet, including goals of calcium and vitamin D intake and alcohol recommendations (less than or equal to 1 drink/day) reviewed; regular seatbelt use; changing batteries in smoke detectors.  Immunization recommendations discussed.  Colonoscopy recommendations reviewed   Medicare Attestation I have personally reviewed: The patient's medical and social history Their use of alcohol, tobacco or illicit drugs Their current medications and supplements The patient's functional ability including ADLs,fall risks, home safety risks, cognitive, and hearing and visual impairment Diet and physical activities Evidence for depression or mood disorders  The patient's weight, height, and BMI have been recorded in the chart.  I have made referrals, counseling, and provided education to the  patient based on  review of the above and I have provided the patient with a written personalized care plan for preventive services.     Harland Dingwall, NP-C   07/11/2020

## 2020-07-11 ENCOUNTER — Other Ambulatory Visit: Payer: Self-pay

## 2020-07-11 ENCOUNTER — Encounter: Payer: Self-pay | Admitting: Family Medicine

## 2020-07-11 ENCOUNTER — Ambulatory Visit (INDEPENDENT_AMBULATORY_CARE_PROVIDER_SITE_OTHER): Payer: Medicare HMO | Admitting: Family Medicine

## 2020-07-11 VITALS — BP 110/62 | Ht 67.0 in | Wt 230.8 lb

## 2020-07-11 DIAGNOSIS — Z0000 Encounter for general adult medical examination without abnormal findings: Secondary | ICD-10-CM

## 2020-07-11 DIAGNOSIS — E2839 Other primary ovarian failure: Secondary | ICD-10-CM | POA: Diagnosis not present

## 2020-07-11 DIAGNOSIS — Z2821 Immunization not carried out because of patient refusal: Secondary | ICD-10-CM

## 2020-07-11 DIAGNOSIS — N644 Mastodynia: Secondary | ICD-10-CM

## 2020-07-11 DIAGNOSIS — F339 Major depressive disorder, recurrent, unspecified: Secondary | ICD-10-CM

## 2020-07-11 DIAGNOSIS — Z7189 Other specified counseling: Secondary | ICD-10-CM

## 2020-07-11 DIAGNOSIS — Z1231 Encounter for screening mammogram for malignant neoplasm of breast: Secondary | ICD-10-CM | POA: Diagnosis not present

## 2020-07-11 DIAGNOSIS — E7800 Pure hypercholesterolemia, unspecified: Secondary | ICD-10-CM

## 2020-07-11 DIAGNOSIS — E669 Obesity, unspecified: Secondary | ICD-10-CM

## 2020-07-11 DIAGNOSIS — R69 Illness, unspecified: Secondary | ICD-10-CM | POA: Diagnosis not present

## 2020-07-11 NOTE — Patient Instructions (Addendum)
  [name] , Thank you for taking time to come for your Medicare Wellness Visit. I appreciate your ongoing commitment to your health goals. Please review the following plan we discussed and let me know if I can assist you in the future.   These are the goals we discussed:  Call and schedule your mammogram and bone density.   Get regular dental and eye exams.   You can get a Tdap and Shingrix vaccines at your pharmacy but check on the price before getting them.    This is a list of the screening recommended for you and due dates:  Health Maintenance  Topic Date Due  . Pneumococcal vaccine  Never done  . Tetanus Vaccine  08/02/2015  . COVID-19 Vaccine (3 - Booster for Pfizer series) 01/28/2020  . Mammogram  03/22/2020  . Complete foot exam   07/06/2020  . Urine Protein Check  07/06/2020  . Eye exam for diabetics  07/21/2020  . Flu Shot  10/22/2020  . Hemoglobin A1C  12/27/2020  . Colon Cancer Screening  08/11/2022  .  Hepatitis C: One time screening is recommended by Center for Disease Control  (CDC) for  adults born from [birth year] through 1965.   Completed  . HIV Screening  Completed  . HPV Vaccine  Aged Out  . Pap Smear  Discontinued

## 2020-07-12 LAB — LIPID PANEL
Chol/HDL Ratio: 3.9 ratio (ref 0.0–4.4)
Cholesterol, Total: 205 mg/dL — ABNORMAL HIGH (ref 100–199)
HDL: 53 mg/dL (ref >39)
LDL Chol Calc (NIH): 131 mg/dL — ABNORMAL HIGH (ref 0–99)
Triglycerides: 117 mg/dL (ref 0–149)
VLDL Cholesterol Cal: 21 mg/dL (ref 5–40)

## 2020-07-13 ENCOUNTER — Other Ambulatory Visit: Payer: Self-pay | Admitting: Family Medicine

## 2020-07-13 ENCOUNTER — Other Ambulatory Visit: Payer: Self-pay | Admitting: Physician Assistant

## 2020-07-13 ENCOUNTER — Other Ambulatory Visit: Payer: Self-pay

## 2020-07-13 DIAGNOSIS — E2839 Other primary ovarian failure: Secondary | ICD-10-CM

## 2020-07-13 DIAGNOSIS — N644 Mastodynia: Secondary | ICD-10-CM

## 2020-07-13 DIAGNOSIS — R110 Nausea: Secondary | ICD-10-CM

## 2020-07-13 DIAGNOSIS — K582 Mixed irritable bowel syndrome: Secondary | ICD-10-CM

## 2020-07-13 MED ORDER — ONDANSETRON 4 MG PO TBDP: 4.0000 mg | ORAL_TABLET | Freq: Three times a day (TID) | ORAL | 0 refills | Status: DC | PRN

## 2020-07-26 ENCOUNTER — Encounter: Payer: Self-pay | Admitting: Family Medicine

## 2020-07-26 ENCOUNTER — Other Ambulatory Visit: Payer: Self-pay | Admitting: Family Medicine

## 2020-07-26 MED ORDER — PRAVASTATIN SODIUM 20 MG PO TABS: 20.0000 mg | ORAL_TABLET | Freq: Every day | ORAL | 1 refills | Status: DC

## 2020-07-30 DIAGNOSIS — N181 Chronic kidney disease, stage 1: Secondary | ICD-10-CM | POA: Diagnosis not present

## 2020-08-02 ENCOUNTER — Other Ambulatory Visit: Payer: Self-pay | Admitting: Family Medicine

## 2020-08-02 ENCOUNTER — Ambulatory Visit
Admission: RE | Admit: 2020-08-02 | Discharge: 2020-08-02 | Disposition: A | Discharge status: Home / Self Care | Payer: Medicare HMO | Source: Ambulatory Visit | Attending: Family Medicine | Admitting: Family Medicine

## 2020-08-02 ENCOUNTER — Other Ambulatory Visit: Payer: Self-pay

## 2020-08-02 ENCOUNTER — Other Ambulatory Visit: Payer: Medicare HMO

## 2020-08-02 DIAGNOSIS — E2839 Other primary ovarian failure: Secondary | ICD-10-CM

## 2020-08-02 DIAGNOSIS — Z780 Asymptomatic menopausal state: Secondary | ICD-10-CM | POA: Diagnosis not present

## 2020-08-06 ENCOUNTER — Other Ambulatory Visit: Payer: Self-pay | Admitting: Family Medicine

## 2020-08-06 DIAGNOSIS — I129 Hypertensive chronic kidney disease with stage 1 through stage 4 chronic kidney disease, or unspecified chronic kidney disease: Secondary | ICD-10-CM | POA: Diagnosis not present

## 2020-08-06 DIAGNOSIS — R69 Illness, unspecified: Secondary | ICD-10-CM | POA: Diagnosis not present

## 2020-08-06 DIAGNOSIS — E1122 Type 2 diabetes mellitus with diabetic chronic kidney disease: Secondary | ICD-10-CM | POA: Diagnosis not present

## 2020-08-06 DIAGNOSIS — E1165 Type 2 diabetes mellitus with hyperglycemia: Secondary | ICD-10-CM

## 2020-08-06 DIAGNOSIS — K219 Gastro-esophageal reflux disease without esophagitis: Secondary | ICD-10-CM | POA: Diagnosis not present

## 2020-08-06 DIAGNOSIS — R809 Proteinuria, unspecified: Secondary | ICD-10-CM | POA: Diagnosis not present

## 2020-08-06 DIAGNOSIS — F4312 Post-traumatic stress disorder, chronic: Secondary | ICD-10-CM | POA: Diagnosis not present

## 2020-08-06 DIAGNOSIS — F330 Major depressive disorder, recurrent, mild: Secondary | ICD-10-CM | POA: Diagnosis not present

## 2020-08-06 DIAGNOSIS — N181 Chronic kidney disease, stage 1: Secondary | ICD-10-CM | POA: Diagnosis not present

## 2020-08-10 ENCOUNTER — Inpatient Hospital Stay: Admission: RE | Admit: 2020-08-10 | Payer: Medicare HMO | Source: Ambulatory Visit

## 2020-08-10 ENCOUNTER — Other Ambulatory Visit: Payer: Medicare HMO

## 2020-08-14 ENCOUNTER — Ambulatory Visit
Admission: RE | Admit: 2020-08-14 | Discharge: 2020-08-14 | Disposition: A | Discharge status: Home / Self Care | Payer: Medicare HMO | Source: Ambulatory Visit | Attending: Family Medicine | Admitting: Family Medicine

## 2020-08-14 ENCOUNTER — Other Ambulatory Visit: Payer: Self-pay

## 2020-08-14 DIAGNOSIS — R922 Inconclusive mammogram: Secondary | ICD-10-CM | POA: Diagnosis not present

## 2020-08-14 DIAGNOSIS — N644 Mastodynia: Secondary | ICD-10-CM

## 2020-08-14 DIAGNOSIS — Z1231 Encounter for screening mammogram for malignant neoplasm of breast: Secondary | ICD-10-CM

## 2020-09-06 DIAGNOSIS — E1129 Type 2 diabetes mellitus with other diabetic kidney complication: Secondary | ICD-10-CM | POA: Diagnosis not present

## 2020-09-07 ENCOUNTER — Other Ambulatory Visit: Payer: Self-pay | Admitting: Family Medicine

## 2020-09-22 ENCOUNTER — Other Ambulatory Visit: Payer: Self-pay | Admitting: Family Medicine

## 2020-10-26 ENCOUNTER — Encounter: Payer: Self-pay | Admitting: Internal Medicine

## 2020-10-26 DIAGNOSIS — R69 Illness, unspecified: Secondary | ICD-10-CM | POA: Diagnosis not present

## 2020-10-26 DIAGNOSIS — F4312 Post-traumatic stress disorder, chronic: Secondary | ICD-10-CM | POA: Diagnosis not present

## 2020-10-26 DIAGNOSIS — F330 Major depressive disorder, recurrent, mild: Secondary | ICD-10-CM | POA: Diagnosis not present

## 2020-12-06 ENCOUNTER — Other Ambulatory Visit: Payer: Self-pay | Admitting: Family Medicine

## 2020-12-06 DIAGNOSIS — E1165 Type 2 diabetes mellitus with hyperglycemia: Secondary | ICD-10-CM

## 2020-12-11 NOTE — Progress Notes (Signed)
Subjective:    Patient ID: [name], female    DOB: 1960-05-13, 60 y.o.   MRN: [id number]  [name] is a 60 y.o. female who presents for follow-up of Type 2 diabetes mellitus and other chronic health conditions. She also has a new concern of heel pain.   Patient is not checking home blood sugars.   Home blood sugar records: patient does not check sugars How often is blood sugars being checked: once a month 110  Current symptoms include: none. Patient denies  n/a .  Patient is checking their feet daily. Any Foot concerns (callous, ulcer, wound, thickened nails, toenail fungus, skin fungus, hammer toe): heel pain Last dilated eye exam: 7/22  HL- only takes pravastatin 2-3 days per week. No side effects.   Anxiety and depression managed by her psychiatrist. Taking Gabapentin for sleep  Complains of heel pain for the past several weeks.   Current treatments:  ozempic . Medication compliance: good  Current diet: well balanced Current exercise: no regular exercise Known diabetic complications: nephropathy  The following portions of the patient's history were reviewed and updated as appropriate: allergies, current medications, past medical history, past social history and problem list.  ROS as in subjective above.     Objective:    Physical Exam Alert and in no distress otherwise not examined.  Blood pressure 124/82, pulse 88, temperature 98.3 F (36.8 C), weight 217 lb 12.8 oz (98.8 kg), last menstrual period 06/23/2014.  Lab Review Diabetic Labs Latest Ref Rng & Units 12/12/2020 07/11/2020 06/27/2020 02/20/2020 11/24/2019  HbA1c 4.0 - 5.6 % 6.3(A) - 6.6(A) - 6.7(H)  Micro/Creat Ratio 0 - 29 mg/g creat - - - - -  Chol 100 - 199 mg/dL - 205(H) - - -  HDL >39 mg/dL - 53 - - -  Calc LDL 0 - 99 mg/dL - 131(H) - - -  Triglycerides 0 - 149 mg/dL - 117 - - -  Creatinine 0.44 - 1.00 mg/dL - - 0.79 0.66 0.77   BP/Weight 12/12/2020 07/11/2020 06/28/2020 06/27/2020 04/30/8339   Systolic BP 962 229 798 - 921  Diastolic BP 82 62 82 - 96  Wt. (Lbs) 217.8 230.8 - 229 -  BMI 34.11 36.15 - 35.87 -   Foot/eye exam completion dates Latest Ref Rng & Units 07/04/2020 07/22/2019  Eye Exam No Retinopathy No Retinopathy No Retinopathy  Foot Form Completion - - -    [name]  reports that she has never smoked. She has never used smokeless tobacco. She reports current alcohol use. She reports that she does not use drugs.     Assessment & Plan:    Diabetes mellitus with nephropathy ([place]) - Plan: HgB A1c, CBC with Differential/Platelet, Comprehensive metabolic panel, TSH, T4, free, Microalbumin / creatinine urine ratio, Lipid panel  Hyperlipidemia associated with type 2 diabetes mellitus ([place]) - Plan: Lipid panel  Anxiety and depression  Heel pain, bilateral  Rx changes: none Hgb A1c 6.3%  Education: Reviewed 'ABCs' of diabetes management (respective goals in parentheses):  A1C (<7), blood pressure (<130/80), and cholesterol (LDL <100). Compliance at present is estimated to be good. Efforts to improve compliance (if necessary) will be directed at dietary modifications: continue to limit carbohydrates, increased exercise, and regular blood sugar monitoring: 2  times weekly. HL- only taking her statin 2-3 days per week. Follow up pending lipids.  Discussed conservative treatment for heel pain including ice, stretching, good supportive shoes and Tylenol (NSAIDs contraindicated). She will let me know if  she would like a referral to podiatry.  Follow up: 6 months

## 2020-12-12 ENCOUNTER — Other Ambulatory Visit: Payer: Self-pay

## 2020-12-12 ENCOUNTER — Ambulatory Visit (INDEPENDENT_AMBULATORY_CARE_PROVIDER_SITE_OTHER): Payer: Medicare HMO | Admitting: Family Medicine

## 2020-12-12 ENCOUNTER — Encounter: Payer: Self-pay | Admitting: Family Medicine

## 2020-12-12 ENCOUNTER — Encounter: Payer: Self-pay | Admitting: Internal Medicine

## 2020-12-12 VITALS — BP 124/82 | HR 88 | Temp 98.3°F | Wt 217.8 lb

## 2020-12-12 DIAGNOSIS — E1121 Type 2 diabetes mellitus with diabetic nephropathy: Secondary | ICD-10-CM | POA: Diagnosis not present

## 2020-12-12 DIAGNOSIS — M79671 Pain in right foot: Secondary | ICD-10-CM | POA: Diagnosis not present

## 2020-12-12 DIAGNOSIS — M79672 Pain in left foot: Secondary | ICD-10-CM | POA: Diagnosis not present

## 2020-12-12 DIAGNOSIS — E785 Hyperlipidemia, unspecified: Secondary | ICD-10-CM

## 2020-12-12 DIAGNOSIS — F419 Anxiety disorder, unspecified: Secondary | ICD-10-CM

## 2020-12-12 DIAGNOSIS — R69 Illness, unspecified: Secondary | ICD-10-CM | POA: Diagnosis not present

## 2020-12-12 DIAGNOSIS — F32A Depression, unspecified: Secondary | ICD-10-CM

## 2020-12-12 DIAGNOSIS — E1169 Type 2 diabetes mellitus with other specified complication: Secondary | ICD-10-CM

## 2020-12-12 LAB — POCT GLYCOSYLATED HEMOGLOBIN (HGB A1C): Hemoglobin A1C: 6.3 % — AB (ref 4.0–5.6)

## 2020-12-13 LAB — CBC WITH DIFFERENTIAL/PLATELET
Basophils Absolute: 0 10*3/uL (ref 0.0–0.2)
Basos: 1 %
EOS (ABSOLUTE): 0.1 10*3/uL (ref 0.0–0.4)
Eos: 1 %
Hematocrit: 38.9 % (ref 34.0–46.6)
Hemoglobin: 12.8 g/dL (ref 11.1–15.9)
Immature Grans (Abs): 0 10*3/uL (ref 0.0–0.1)
Immature Granulocytes: 0 %
Lymphocytes Absolute: 2.9 10*3/uL (ref 0.7–3.1)
Lymphs: 50 %
MCH: 24.5 pg — ABNORMAL LOW (ref 26.6–33.0)
MCHC: 32.9 g/dL (ref 31.5–35.7)
MCV: 75 fL — ABNORMAL LOW (ref 79–97)
Monocytes Absolute: 0.4 10*3/uL (ref 0.1–0.9)
Monocytes: 6 %
Neutrophils Absolute: 2.5 10*3/uL (ref 1.4–7.0)
Neutrophils: 42 %
Platelets: 236 10*3/uL (ref 150–450)
RBC: 5.22 x10E6/uL (ref 3.77–5.28)
RDW: 13.9 % (ref 11.7–15.4)
WBC: 5.9 10*3/uL (ref 3.4–10.8)

## 2020-12-13 LAB — COMPREHENSIVE METABOLIC PANEL
ALT: 9 IU/L (ref 0–32)
AST: 14 IU/L (ref 0–40)
Albumin/Globulin Ratio: 1.5 (ref 1.2–2.2)
Albumin: 4.4 g/dL (ref 3.8–4.9)
Alkaline Phosphatase: 86 IU/L (ref 44–121)
BUN/Creatinine Ratio: 13 (ref 9–23)
BUN: 11 mg/dL (ref 6–24)
Bilirubin Total: 0.4 mg/dL (ref 0.0–1.2)
CO2: 23 mmol/L (ref 20–29)
Calcium: 9.7 mg/dL (ref 8.7–10.2)
Chloride: 102 mmol/L (ref 96–106)
Creatinine, Ser: 0.88 mg/dL (ref 0.57–1.00)
Globulin, Total: 2.9 g/dL (ref 1.5–4.5)
Glucose: 99 mg/dL (ref 65–99)
Potassium: 3.9 mmol/L (ref 3.5–5.2)
Sodium: 141 mmol/L (ref 134–144)
Total Protein: 7.3 g/dL (ref 6.0–8.5)
eGFR: 76 mL/min/{1.73_m2} (ref >59)

## 2020-12-13 LAB — MICROALBUMIN / CREATININE URINE RATIO
Creatinine, Urine: 217.5 mg/dL
Microalb/Creat Ratio: 136 mg/g creat — ABNORMAL HIGH (ref 0–29)
Microalbumin, Urine: 296.6 ug/mL

## 2020-12-13 LAB — LIPID PANEL
Chol/HDL Ratio: 3.5 ratio (ref 0.0–4.4)
Cholesterol, Total: 209 mg/dL — ABNORMAL HIGH (ref 100–199)
HDL: 59 mg/dL (ref >39)
LDL Chol Calc (NIH): 134 mg/dL — ABNORMAL HIGH (ref 0–99)
Triglycerides: 89 mg/dL (ref 0–149)
VLDL Cholesterol Cal: 16 mg/dL (ref 5–40)

## 2020-12-13 LAB — TSH: TSH: 1.98 u[IU]/mL (ref 0.450–4.500)

## 2020-12-13 LAB — T4, FREE: Free T4: 1 ng/dL (ref 0.82–1.77)

## 2020-12-13 NOTE — Progress Notes (Signed)
Can you please forward her labs to her nephrologist?

## 2021-01-09 DIAGNOSIS — F332 Major depressive disorder, recurrent severe without psychotic features: Secondary | ICD-10-CM | POA: Diagnosis not present

## 2021-01-09 DIAGNOSIS — F411 Generalized anxiety disorder: Secondary | ICD-10-CM | POA: Diagnosis not present

## 2021-01-09 DIAGNOSIS — F900 Attention-deficit hyperactivity disorder, predominantly inattentive type: Secondary | ICD-10-CM | POA: Diagnosis not present

## 2021-01-09 DIAGNOSIS — R69 Illness, unspecified: Secondary | ICD-10-CM | POA: Diagnosis not present

## 2021-01-11 ENCOUNTER — Other Ambulatory Visit: Payer: Self-pay

## 2021-01-11 ENCOUNTER — Ambulatory Visit (INDEPENDENT_AMBULATORY_CARE_PROVIDER_SITE_OTHER): Payer: Medicare HMO | Admitting: Medical

## 2021-01-11 VITALS — BP 120/80 | HR 102 | Temp 97.4°F | Wt 219.0 lb

## 2021-01-11 DIAGNOSIS — R110 Nausea: Secondary | ICD-10-CM | POA: Diagnosis not present

## 2021-01-11 DIAGNOSIS — K219 Gastro-esophageal reflux disease without esophagitis: Secondary | ICD-10-CM | POA: Diagnosis not present

## 2021-01-11 DIAGNOSIS — R195 Other fecal abnormalities: Secondary | ICD-10-CM | POA: Insufficient documentation

## 2021-01-11 DIAGNOSIS — R109 Unspecified abdominal pain: Secondary | ICD-10-CM | POA: Insufficient documentation

## 2021-01-11 DIAGNOSIS — K582 Mixed irritable bowel syndrome: Secondary | ICD-10-CM

## 2021-01-11 MED ORDER — ONDANSETRON 4 MG PO TBDP: 4.0000 mg | ORAL_TABLET | Freq: Three times a day (TID) | ORAL | 1 refills | Status: DC | PRN

## 2021-01-11 MED ORDER — HYOSCYAMINE SULFATE ER 0.375 MG PO TB12: 0.3750 mg | ORAL_TABLET | Freq: Two times a day (BID) | ORAL | 1 refills | Status: DC | PRN

## 2021-01-11 NOTE — Progress Notes (Signed)
Subjective:  [name] is a 60 y.o. female who presents for Chief Complaint  Patient presents with   GI issues    GI issues for a week- had corn on Sunday and Monday had steak and cheese sub and it hasn't been right since. Has IBS. Taken otc to help at beginning of week. Having some depression issues but seeing specialist for this     Here for gastric issues.  Underlying medical problems include diabetes type 2, asthma, anxiety, chronic kidney disease, hepatic steatosis, hyperlipidemia, IBS, GERD, neuropathy, hyperlipidemia.  Getting ready to go to [facility] for a week to celebrate birthday.  Started having problems 4 nights ago.  In the middle of the night had cramping in the abdomen.   She had eating some recent corn, grilled onions, stake and cheese sub and since then has felt off.   Having some loose stools. Used imodium OTC several days.  No blood in stool.   Has had some nausea but no vomiting.  No fever.   [place] water is irritating currently.   Has been out of zofran and hyocasamine.    Typically has several BMs daily, and usually soft and formed.  Occasionally has constipation.    No sick contacts.   No urinary c/o.     No recent new medications.  No URI symptoms.   Hasn't been using the omeprazole of late either.   No other aggravating or relieving factors.    No other c/o.  Past Medical History:  Diagnosis Date   Abdominal pain    due to fibroids   Anxiety    Arthritis    hips and knees   Asthma    stress induced asthma   Chest pain 06/2019   Chlamydia    Chronic kidney disease    ckd stage 1 per 5-26-201 dr Cecille Rubin [facility] note   Depression    DM type 2 (diabetes mellitus, type 2) ([place]) dx jan 2021   Fibroid    uterine   GERD (gastroesophageal reflux disease)    Hepatic steatosis 12/05/2019   Hyperlipidemia    IBS (irritable bowel syndrome)    Liver nodule 1999   no follow up needed was due to stress   Morbid obesity ([place])    Neuropathy    both  fingers and toes at times   Obesity    Protein in urine 03/2019   Trichomonas    Tubular adenoma of colon 01/2012   Past Surgical History:  Procedure Laterality Date   colonscopy  2017   COLPOSCOPY  1990   results were normal   CYSTOSCOPY  02/28/2020   Procedure: CYSTOSCOPY;  Surgeon: Lavonia Drafts, MD;  Location: [facility];  Service: Gynecology;;   DILITATION & CURRETTAGE/HYSTROSCOPY WITH NOVASURE ABLATION N/A 08/23/2019   Procedure: DILATATION & CURETTAGE/DIAGNOSTIC HYSTEROSCOPY;  Surgeon: Lavonia Drafts, MD;  Location: [facility];  Service: Gynecology;  Laterality: N/A;   IR GENERIC HISTORICAL  03/21/2016   IR EMBO TUMOR ORGAN ISCHEMIA INFARCT INC GUIDE ROADMAPPING 03/21/2016 Arne Cleveland, MD [facility]   IR GENERIC HISTORICAL  03/21/2016   IR US GUIDE VASC ACCESS RIGHT 03/21/2016 Arne Cleveland, MD [facility]   IR GENERIC HISTORICAL  03/21/2016   IR [facility] SELECTIVE EACH ADDITIONAL VESSEL 03/21/2016 Arne Cleveland, MD [facility]   IR GENERIC HISTORICAL  03/21/2016   IR [facility] SELECTIVE EACH ADDITIONAL VESSEL 03/21/2016 Arne Cleveland, MD [facility]   IR GENERIC HISTORICAL  03/21/2016  IR ANGIOGRAM PELVIS SELECTIVE OR SUPRASELECTIVE 03/21/2016 Arne Cleveland, MD [facility]   IR GENERIC HISTORICAL  03/21/2016   IR ANGIOGRAM PELVIS SELECTIVE OR SUPRASELECTIVE 03/21/2016 Arne Cleveland, MD [facility]   IR GENERIC HISTORICAL  02/27/2016   IR RADIOLOGIST EVAL & MGMT 02/27/2016 Arne Cleveland, MD [facility]   IR GENERIC HISTORICAL  04/03/2016   IR RADIOLOGIST EVAL & MGMT 04/03/2016 Arne Cleveland, MD [facility]   IR RADIOLOGIST EVAL & MGMT  08/26/2016   labial cyst removal     ROBOTIC ASSISTED TOTAL HYSTERECTOMY Bilateral 02/28/2020   Procedure: XI ROBOTIC ASSISTED TOTAL HYSTERECTOMY AND BILATERAL SALPINGECTOMY;  Surgeon: Lavonia Drafts, MD;  Location: [place];  Service:  Gynecology;  Laterality: Bilateral;   TUBAL LIGATION     20 years ago    The following portions of the patient's history were reviewed and updated as appropriate: allergies, current medications, past family history, past medical history, past social history, past surgical history and problem list.  ROS Otherwise as in subjective above  Objective: BP 120/80   Pulse (!) 102   Temp (!) 97.4 F (36.3 C)   Wt 219 lb (99.3 kg)   LMP 06/23/2014   BMI 34.30 kg/m   General appearance: alert, no distress, well developed, well nourished Oral cavity: MMM, no lesions Neck: supple, no lymphadenopathy, no thyromegaly, no masses Heart: RRR, normal S1, S2, no murmurs Lungs: CTA bilaterally, no wheezes, rhonchi, or rales Abdomen: +increased bs, soft, mild epigastric tenderness, otherwise non tender, non distended, no masses, no hepatomegaly, no splenomegaly Pulses: 2+ radial pulses, 2+ pedal pulses, normal cap refill Ext: no edema  I reviewed 2017 EGD and 2019 colonoscopy.  She does have a history of some gastritis and colon polyps and diverticulosis.  I reviewed her recent labs she had on her physical in September which were stable   Assessment: Encounter Diagnoses  Name Primary?   Loose stools Yes   Gastroesophageal reflux disease, unspecified whether esophagitis present    Abdominal cramping    Nausea    Irritable bowel syndrome with both constipation and diarrhea      Plan: Symptoms and exam suggest IBS flareup and acid reflux flareup.  Likely triggered by stress recently, aggravated by some recent food choices.  Advise she begin back on her omeprazole for the next 2 weeks.  We discussed proper use of this.  She has plenty at home.  Begin back on Zofran and hyoscyamine as needed.  Avoid trigger foods.  Reduce stress when possible.  We discussed foods that typically can make acid reflux or IBS worse.  If not back to normal within 2 weeks you are still having quite a few  symptoms 2 weeks now that follow-up  I wished her a happy birthday as she heads to [place] soon for a trip.  [name] was seen today for gi issues.  Diagnoses and all orders for this visit:  Loose stools  Gastroesophageal reflux disease, unspecified whether esophagitis present  Abdominal cramping  Nausea -     ondansetron (ZOFRAN ODT) 4 MG disintegrating tablet; Take 1 tablet (4 mg total) by mouth every 8 (eight) hours as needed for nausea or vomiting (30-60 minutes before breakfast and dinner).  Irritable bowel syndrome with both constipation and diarrhea -     hyoscyamine (LEVBID) 0.375 MG 12 hr tablet; Take 1 tablet (0.375 mg total) by mouth every 12 (twelve) hours as needed.   Follow up: prn

## 2021-01-17 ENCOUNTER — Other Ambulatory Visit: Payer: Self-pay

## 2021-01-17 MED ORDER — PRAVASTATIN SODIUM 20 MG PO TABS: 20.0000 mg | ORAL_TABLET | Freq: Every day | ORAL | 1 refills | Status: DC

## 2021-01-29 ENCOUNTER — Telehealth (INDEPENDENT_AMBULATORY_CARE_PROVIDER_SITE_OTHER): Payer: Medicare HMO | Admitting: Family Medicine

## 2021-01-29 ENCOUNTER — Other Ambulatory Visit: Payer: Self-pay

## 2021-01-29 VITALS — BP 122/72 | HR 107 | Temp 97.8°F | Wt 219.0 lb

## 2021-01-29 DIAGNOSIS — R051 Acute cough: Secondary | ICD-10-CM | POA: Diagnosis not present

## 2021-01-29 MED ORDER — AZITHROMYCIN 500 MG PO TABS: 500.0000 mg | ORAL_TABLET | Freq: Every day | ORAL | 0 refills | Status: DC

## 2021-01-29 NOTE — Progress Notes (Signed)
   Subjective:    Patient ID: [name], female    DOB: 1960-07-09, 60 y.o.   MRN: [id number]  HPI Documentation for virtual audio and video telecommunications through [place] encounter: The patient was located at home. 2 patient identifiers used.  The provider was located in the office. The patient did consent to this visit and is aware of possible charges through their insurance for this visit. The other persons participating in this telemedicine service were none. Time spent on call was 5 minutes and in review of previous records >12 minutes total for counseling and coordination of care. This virtual service is not related to other E/M service within previous 7 days.  She states that October 27 she noted difficulty with fatigue, fever, chills, headache and malaise.  A couple days later she got better but the symptoms then reoccurred November 2 and has been lingering since then.  Today she still is having difficulty with myalgias sore throat, dry cough, headache with some occasional nausea and abdominal trouble.  Review of Systems     Objective:   Physical Exam Alert and in no distress otherwise not examined       Assessment & Plan:  Acute cough - Plan: azithromycin (ZITHROMAX) 500 MG tablet I explained that I thought that she probably had a viral illness and then had a secondary bacterial infection causing the reoccurrence of her cough, congestion etc.  She has had azithromycin in the past and had no difficulty with that especially since she has had some difficulty recently with some nausea, I am comfortable giving it to her.  She will call at the beginning of the week if she still having difficulty.

## 2021-02-01 ENCOUNTER — Ambulatory Visit: Payer: Medicare HMO | Admitting: Medical

## 2021-02-05 ENCOUNTER — Other Ambulatory Visit: Payer: Self-pay

## 2021-02-05 ENCOUNTER — Ambulatory Visit (INDEPENDENT_AMBULATORY_CARE_PROVIDER_SITE_OTHER): Payer: Medicare HMO | Admitting: Medical

## 2021-02-05 VITALS — BP 110/70 | HR 106 | Temp 97.4°F | Wt 218.2 lb

## 2021-02-05 DIAGNOSIS — R142 Eructation: Secondary | ICD-10-CM

## 2021-02-05 DIAGNOSIS — R110 Nausea: Secondary | ICD-10-CM

## 2021-02-05 DIAGNOSIS — K580 Irritable bowel syndrome with diarrhea: Secondary | ICD-10-CM

## 2021-02-05 DIAGNOSIS — K219 Gastro-esophageal reflux disease without esophagitis: Secondary | ICD-10-CM | POA: Diagnosis not present

## 2021-02-05 DIAGNOSIS — E785 Hyperlipidemia, unspecified: Secondary | ICD-10-CM

## 2021-02-05 DIAGNOSIS — E1169 Type 2 diabetes mellitus with other specified complication: Secondary | ICD-10-CM | POA: Diagnosis not present

## 2021-02-05 DIAGNOSIS — R109 Unspecified abdominal pain: Secondary | ICD-10-CM

## 2021-02-05 MED ORDER — DEXLANSOPRAZOLE 60 MG PO CPDR: 60.0000 mg | DELAYED_RELEASE_CAPSULE | Freq: Every day | ORAL | 1 refills | Status: DC

## 2021-02-05 MED ORDER — FAMOTIDINE 20 MG PO TABS: 20.0000 mg | ORAL_TABLET | Freq: Every day | ORAL | 1 refills | Status: DC

## 2021-02-05 MED ORDER — ONDANSETRON 4 MG PO TBDP: 4.0000 mg | ORAL_TABLET | Freq: Three times a day (TID) | ORAL | 1 refills | Status: DC | PRN

## 2021-02-05 NOTE — Progress Notes (Signed)
Subjective:  [name] is a 60 y.o. female who presents for Chief Complaint  Patient presents with   GI issues    GI issues. Taking zofran everyday and no appetite, burning across belly and then into back     Medical team: Dr. Lucio Edward, GI Dr. Lavonia Drafts, gyn Prior PCP here with Harland Dingwall, NP  Here for recheck on gastric issues.  Underlying medical problems include diabetes type 2, asthma, anxiety, chronic kidney disease, hepatic steatosis, hyperlipidemia, IBS, GERD, neuropathy, hyperlipidemia.  I saw her in late October for loose stools, IBS flare up.  That improved, but she was heading to [facility] for a trip.   Got sick while on her trip had flu like symptoms.  This is mostly resolved at this point  She is here mainly today for burning across her stomach and chest, lots and lots of gas and belching, IBS flareups, spasms of the belly, chronic loose stools.  She has a history of IBS diarrhea predominant  She has diabetes, on Ozempic but really does not feel like this has been a medicine that has caused any problems.  She does not feel like her nausea and loose stools is related to metformin necessarily.  She has been on Ozempic for the last year  In the past week she has had belly discomfort, burning sensation across the belly, chest discomfort, lots of belching, but no specific right upper quadrant left upper quadrant or left lower quadrant pain.  She still has her gallbladder and appendix.  No history of pancreatitis, no history of diverticulitis.  She denies any recent alcohol use.  She does not have a history of elevated triglycerides.  Her father has a history of pancreatitis though  She is using the hyoscyamine and Zofran but the symptoms have not really improved  No blood in the stool.  No urinary complaint.  No fever.  No body aches or chills.  She actually forgot to take her Ozempic last week and the symptoms have been present off the medication for a  week, so she does not think the Ozempic is causing a problem  No other aggravating or relieving factors.    No other c/o.  Past Medical History:  Diagnosis Date   Abdominal pain    due to fibroids   Anxiety    Arthritis    hips and knees   Asthma    stress induced asthma   Chest pain 06/2019   Chlamydia    Chronic kidney disease    ckd stage 1 per 5-26-201 dr Cecille Rubin [facility] note   Depression    DM type 2 (diabetes mellitus, type 2) ([place]) dx jan 2021   Fibroid    uterine   GERD (gastroesophageal reflux disease)    Hepatic steatosis 12/05/2019   Hyperlipidemia    IBS (irritable bowel syndrome)    Liver nodule 1999   no follow up needed was due to stress   Morbid obesity ([place])    Neuropathy    both fingers and toes at times   Obesity    Protein in urine 03/2019   Trichomonas    Tubular adenoma of colon 01/2012   Current Outpatient Medications on File Prior to Visit  Medication Sig Dispense Refill   albuterol (VENTOLIN HFA) 108 (90 Base) MCG/ACT inhaler INHALE 2 PUFFS INTO THE LUNGS EVERY 6 (SIX) HOURS AS NEEDED FOR WHEEZING. 18 each 0   aspirin 81 MG tablet Take 81 mg by mouth daily.  clonazePAM (KLONOPIN) 0.5 MG tablet Take 0.5 mg by mouth daily as needed.     Cyanocobalamin (B-12 PO) Take by mouth.     diclofenac Sodium (VOLTAREN) 1 % GEL APPLY 4 G TOPICALLY 4 (FOUR) TIMES DAILY. (Patient taking differently: Apply 4 g topically as needed.) 200 g 1   fluticasone (FLONASE) 50 MCG/ACT nasal spray SPRAY 2 SPRAYS INTO EACH NOSTRIL EVERY DAY 48 mL 0   gabapentin (NEURONTIN) 100 MG capsule Take 100 mg by mouth 3 (three) times daily.     Glucose Blood (BLOOD GLUCOSE TEST STRIPS) STRP Test once a day. Pt has a onetouch verio flex meter dx e11.9 100 strip 1   hyoscyamine (LEVBID) 0.375 MG 12 hr tablet Take 1 tablet (0.375 mg total) by mouth every 12 (twelve) hours as needed. 20 tablet 1   Lancets (ONETOUCH ULTRASOFT) lancets Test once a day. Dx E11.9 pt has onetouch verio flex  meter 100 each 1   losartan (COZAAR) 25 MG tablet Take 25 mg by mouth daily.     meclizine (ANTIVERT) 25 MG tablet TAKE 1 TABLET (25 MG TOTAL) BY MOUTH 2 (TWO) TIMES DAILY AS NEEDED FOR DIZZINESS. 30 tablet 0   Melatonin 5 MG CAPS Take 5 mg by mouth at bedtime as needed (sleep).      Multiple Vitamin (MULTIVITAMIN) tablet Take 1 tablet daily by mouth.     OZEMPIC, 0.25 OR 0.5 MG/DOSE, 2 MG/1.5ML SOPN INJECT 0.5 MG INTO THE SKIN ONCE A WEEK. 1.5 mL 2   pravastatin (PRAVACHOL) 20 MG tablet Take 1 tablet (20 mg total) by mouth daily. 90 tablet 1   venlafaxine XR (EFFEXOR-XR) 150 MG 24 hr capsule Take by mouth daily.     venlafaxine XR (EFFEXOR-XR) 75 MG 24 hr capsule TAKE 1 CAPSULE (75 MG TOTAL) BY MOUTH DAILY WITH BREAKFAST. (Patient taking differently: Take 150 mg by mouth 2 (two) times daily after a meal.) 90 capsule 0   No current facility-administered medications on file prior to visit.   Past Surgical History:  Procedure Laterality Date   colonscopy  2017   COLPOSCOPY  1990   results were normal   CYSTOSCOPY  02/28/2020   Procedure: CYSTOSCOPY;  Surgeon: Lavonia Drafts, MD;  Location: [place];  Service: Gynecology;;   [place] N/A 08/23/2019   Procedure: DILATATION & CURETTAGE/DIAGNOSTIC HYSTEROSCOPY;  Surgeon: Lavonia Drafts, MD;  Location: [place];  Service: Gynecology;  Laterality: N/A;   IR GENERIC HISTORICAL  03/21/2016   IR EMBO TUMOR ORGAN ISCHEMIA INFARCT INC GUIDE ROADMAPPING 03/21/2016 Arne Cleveland, MD [facility]   IR GENERIC HISTORICAL  03/21/2016   IR US GUIDE VASC ACCESS RIGHT 03/21/2016 Arne Cleveland, MD [facility]   IR GENERIC HISTORICAL  03/21/2016   IR ANGIOGRAM SELECTIVE EACH ADDITIONAL VESSEL 03/21/2016 Arne Cleveland, MD [facility]   IR GENERIC HISTORICAL  03/21/2016   IR ANGIOGRAM SELECTIVE EACH ADDITIONAL VESSEL 03/21/2016 Arne Cleveland, MD [facility]   IR GENERIC HISTORICAL  03/21/2016   IR ANGIOGRAM PELVIS SELECTIVE OR SUPRASELECTIVE 03/21/2016 Arne Cleveland, MD [facility]   IR GENERIC HISTORICAL  03/21/2016   IR ANGIOGRAM PELVIS SELECTIVE OR SUPRASELECTIVE 03/21/2016 Arne Cleveland, MD [facility]   IR GENERIC HISTORICAL  02/27/2016   IR RADIOLOGIST EVAL & MGMT 02/27/2016 Arne Cleveland, MD [facility]   IR GENERIC HISTORICAL  04/03/2016   IR RADIOLOGIST EVAL & MGMT 04/03/2016 Arne Cleveland, MD [facility]   IR  RADIOLOGIST EVAL & MGMT  08/26/2016   labial cyst removal     ROBOTIC ASSISTED TOTAL HYSTERECTOMY Bilateral 02/28/2020   Procedure: XI ROBOTIC ASSISTED TOTAL HYSTERECTOMY AND BILATERAL SALPINGECTOMY;  Surgeon: Lavonia Drafts, MD;  Location: [place];  Service: Gynecology;  Laterality: Bilateral;   TUBAL LIGATION     20 years ago     The following portions of the patient's history were reviewed and updated as appropriate: allergies, current medications, past family history, past medical history, past social history, past surgical history and problem list.  ROS Otherwise as in subjective above   Objective: BP 110/70   Pulse (!) 106   Temp (!) 97.4 F (36.3 C)   Wt 218 lb 3.2 oz (99 kg)   LMP 06/23/2014   BMI 34.17 kg/m   Wt Readings from Last 3 Encounters:  02/05/21 218 lb 3.2 oz (99 kg)  01/29/21 219 lb (99.3 kg)  01/11/21 219 lb (99.3 kg)    General appearance: alert, no distress, well developed, well nourished Neck: supple, no lymphadenopathy, no thyromegaly, no masses Heart: RRR, normal S1, S2, no murmurs Lungs: CTA bilaterally, no wheezes, rhonchi, or rales Abdomen: +bs, soft, tender epigastric region only today, otherwise non tender, non distended, no masses, no hepatomegaly, no splenomegaly Pulses: 2+ radial pulses, 2+ pedal pulses, normal cap refill Ext: no edema   I reviewed 2017 EGD and 2019 colonoscopy.  She does have a history of some gastritis and  colon polyps and diverticulosis.   I reviewed her recent labs she had on her physical in September which were stable   Assessment: Encounter Diagnoses  Name Primary?   Irritable bowel syndrome with diarrhea Yes   Abdominal cramping    Type 2 diabetes mellitus with other specified complication, without long-term current use of insulin (HCC)    Dyslipidemia    Gastroesophageal reflux disease, unspecified whether esophagitis present    Nausea    Belching      Plan: Abdominal cramping, nausea, belching, IBS, GERD -we discussed her abdominal symptoms.  Her symptoms today suggest a couple possibilities including GERD, possible ulcer, aggravation of IBS, food sensitivity/food allergy or other.  I will have her stop omeprazole and change to Dexilant in the morning famotidine at night.  We discussed proper use of medication.  Zofran as needed.  We spent a great deal time of talk about ways to deal with food intolerances and using a food diary.  I asked her to cut out spicy foods, peppers, any type of acidic food, greasy foods, gas producing foods like onions and pinto beans and cabbage.  If not much improved within the next 2 weeks I recommended we refer back to gastroenterology.  I reviewed her last EGD and colonoscopy on file.  I also reviewed an abdominal ultrasound she had last September 2021 that was normal except for fatty liver disease.  Her symptoms today do not suggest gallbladder issue or appendicitis or diverticulitis or pancreatitis.    Other than being on Ozempic, she has low risk for pancreatitis.  No history of elevated triglycerides and no heavy alcohol use.  Her lipase has been checked multiple times in the past and has been normal.  Nevertheless given her current symptoms advise she cut out the Ozempic for 2 weeks to see if this helps at all  We discussed updated abdominal imaging versus HIDA scan versus food allergy testing vs referral back to GI.  If her symptoms or not improving  over the next  few weeks I recommended referring back to GI as a neck step.  Fatty liver disease on prior ultrasound-i recommend you lose weight through healthy diet and exercise to reduce progression to liver damage and scarring  Diabetes -most recent hemoglobin A1c 6.3% in September.  We will temporarily stop Ozempic to see if her gastric symptoms improve.  Dyslipidemia -doing fine on current therapy   [place] was seen today for gi issues.  Diagnoses and all orders for this visit:  Irritable bowel syndrome with diarrhea  Abdominal cramping  Type 2 diabetes mellitus with other specified complication, without long-term current use of insulin (HCC)  Dyslipidemia  Gastroesophageal reflux disease, unspecified whether esophagitis present  Nausea -     ondansetron (ZOFRAN ODT) 4 MG disintegrating tablet; Take 1 tablet (4 mg total) by mouth every 8 (eight) hours as needed for nausea or vomiting (30-60 minutes before breakfast and dinner).  Belching  Other orders -     famotidine (PEPCID) 20 MG tablet; Take 1 tablet (20 mg total) by mouth at bedtime. -     dexlansoprazole (DEXILANT) 60 MG capsule; Take 1 capsule (60 mg total) by mouth daily.   Follow up: call back in 2 weeks

## 2021-02-07 ENCOUNTER — Ambulatory Visit: Payer: Medicare HMO | Admitting: Medical

## 2021-02-15 ENCOUNTER — Encounter (HOSPITAL_BASED_OUTPATIENT_CLINIC_OR_DEPARTMENT_OTHER): Payer: Self-pay | Admitting: *Deleted

## 2021-02-15 ENCOUNTER — Emergency Department (HOSPITAL_BASED_OUTPATIENT_CLINIC_OR_DEPARTMENT_OTHER): Payer: Medicare HMO

## 2021-02-15 ENCOUNTER — Emergency Department (HOSPITAL_BASED_OUTPATIENT_CLINIC_OR_DEPARTMENT_OTHER)
Admission: EM | Admit: 2021-02-15 | Discharge: 2021-02-15 | Disposition: A | Discharge status: Home / Self Care | Payer: Medicare HMO | Attending: Emergency Medicine | Admitting: Emergency Medicine

## 2021-02-15 ENCOUNTER — Other Ambulatory Visit: Payer: Self-pay

## 2021-02-15 DIAGNOSIS — R0602 Shortness of breath: Secondary | ICD-10-CM | POA: Diagnosis not present

## 2021-02-15 DIAGNOSIS — R079 Chest pain, unspecified: Secondary | ICD-10-CM | POA: Diagnosis not present

## 2021-02-15 DIAGNOSIS — N181 Chronic kidney disease, stage 1: Secondary | ICD-10-CM | POA: Insufficient documentation

## 2021-02-15 DIAGNOSIS — J4520 Mild intermittent asthma, uncomplicated: Secondary | ICD-10-CM | POA: Diagnosis not present

## 2021-02-15 DIAGNOSIS — R072 Precordial pain: Secondary | ICD-10-CM | POA: Insufficient documentation

## 2021-02-15 DIAGNOSIS — Z7951 Long term (current) use of inhaled steroids: Secondary | ICD-10-CM | POA: Insufficient documentation

## 2021-02-15 DIAGNOSIS — E1122 Type 2 diabetes mellitus with diabetic chronic kidney disease: Secondary | ICD-10-CM | POA: Insufficient documentation

## 2021-02-15 DIAGNOSIS — Z7982 Long term (current) use of aspirin: Secondary | ICD-10-CM | POA: Insufficient documentation

## 2021-02-15 DIAGNOSIS — Z79899 Other long term (current) drug therapy: Secondary | ICD-10-CM | POA: Insufficient documentation

## 2021-02-15 DIAGNOSIS — E1140 Type 2 diabetes mellitus with diabetic neuropathy, unspecified: Secondary | ICD-10-CM | POA: Insufficient documentation

## 2021-02-15 LAB — HEPATIC FUNCTION PANEL
ALT: 10 U/L (ref 0–44)
AST: 14 U/L — ABNORMAL LOW (ref 15–41)
Albumin: 4.7 g/dL (ref 3.5–5.0)
Alkaline Phosphatase: 61 U/L (ref 38–126)
Bilirubin, Direct: <0.1 mg/dL (ref 0.0–0.2)
Total Bilirubin: 0.3 mg/dL (ref 0.3–1.2)
Total Protein: 7.8 g/dL (ref 6.5–8.1)

## 2021-02-15 LAB — CBC
HCT: 39.5 % (ref 36.0–46.0)
Hemoglobin: 12.8 g/dL (ref 12.0–15.0)
MCH: 25.1 pg — ABNORMAL LOW (ref 26.0–34.0)
MCHC: 32.4 g/dL (ref 30.0–36.0)
MCV: 77.5 fL — ABNORMAL LOW (ref 80.0–100.0)
Platelets: 226 10*3/uL (ref 150–400)
RBC: 5.1 MIL/uL (ref 3.87–5.11)
RDW: 14.1 % (ref 11.5–15.5)
WBC: 5.7 10*3/uL (ref 4.0–10.5)
nRBC: 0 % (ref 0.0–0.2)

## 2021-02-15 LAB — TROPONIN I (HIGH SENSITIVITY)
Troponin I (High Sensitivity): 2 ng/L (ref <18)
Troponin I (High Sensitivity): 2 ng/L (ref <18)

## 2021-02-15 LAB — BASIC METABOLIC PANEL
Anion gap: 11 (ref 5–15)
BUN: 12 mg/dL (ref 6–20)
CO2: 27 mmol/L (ref 22–32)
Calcium: 10.3 mg/dL (ref 8.9–10.3)
Chloride: 103 mmol/L (ref 98–111)
Creatinine, Ser: 0.78 mg/dL (ref 0.44–1.00)
GFR, Estimated: >60 mL/min (ref >60)
Glucose, Bld: 112 mg/dL — ABNORMAL HIGH (ref 70–99)
Potassium: 3.9 mmol/L (ref 3.5–5.1)
Sodium: 141 mmol/L (ref 135–145)

## 2021-02-15 LAB — D-DIMER, QUANTITATIVE: D-Dimer, Quant: 0.37 ug/mL-FEU (ref 0.00–0.50)

## 2021-02-15 MED ORDER — IPRATROPIUM-ALBUTEROL 0.5-2.5 (3) MG/3ML IN SOLN
RESPIRATORY_TRACT | Status: AC
  Administered 2021-02-15: 3 mL via RESPIRATORY_TRACT
  Filled 2021-02-15: qty 3

## 2021-02-15 MED ORDER — IPRATROPIUM-ALBUTEROL 0.5-2.5 (3) MG/3ML IN SOLN: 3.0000 mL | Freq: Once | RESPIRATORY_TRACT | Status: AC

## 2021-02-15 MED ORDER — ALBUTEROL SULFATE (2.5 MG/3ML) 0.083% IN NEBU
INHALATION_SOLUTION | RESPIRATORY_TRACT | Status: AC
  Administered 2021-02-15: 2.5 mg via RESPIRATORY_TRACT
  Filled 2021-02-15: qty 3

## 2021-02-15 MED ORDER — ALBUTEROL SULFATE (2.5 MG/3ML) 0.083% IN NEBU: 5.0000 mg | INHALATION_SOLUTION | Freq: Once | RESPIRATORY_TRACT | Status: DC

## 2021-02-15 MED ORDER — ALBUTEROL SULFATE (2.5 MG/3ML) 0.083% IN NEBU: 2.5000 mg | INHALATION_SOLUTION | Freq: Once | RESPIRATORY_TRACT | Status: AC

## 2021-02-15 MED ORDER — IPRATROPIUM BROMIDE 0.02 % IN SOLN: 0.5000 mg | Freq: Once | RESPIRATORY_TRACT | Status: DC

## 2021-02-15 MED ORDER — AEROCHAMBER PLUS FLO-VU MISC
1.0000 | Freq: Once | Status: AC
Start: 2021-02-15 — End: 2021-02-15
  Administered 2021-02-15: 1
  Filled 2021-02-15: qty 1

## 2021-02-15 NOTE — Discharge Instructions (Signed)
Please read and follow all provided instructions.  Your diagnoses today include:  1. Precordial pain   2. Shortness of breath     Tests performed today include: Complete blood cell count: No problems Complete metabolic panel: No problems Cardiac enzymes (blood test looking for stress on the heart): Were negative x2 without any signs of stress on the heart EKG: Showed fast heart rate otherwise unchanged from previous Chest x-ray: No signs of pneumonia or other problems D-dimer: Screening test for blood clot was negative Vital signs. See below for your results today.   Medications prescribed:  None  Take any prescribed medications only as directed.  Follow-up instructions: Please follow-up with your primary care provider as soon as you can for further evaluation of your symptoms.   Return instructions:  SEEK IMMEDIATE MEDICAL ATTENTION IF: You have severe chest pain, especially if the pain is crushing or pressure-like and spreads to the arms, back, neck, or jaw, or if you have sweating, nausea (feeling sick to your stomach), or shortness of breath. THIS IS AN EMERGENCY. Don't wait to see if the pain will go away. Get medical help at once. Call 911 or [phone] (operator). DO NOT drive yourself to the hospital.  Your chest pain gets worse and does not go away with rest.  You have an attack of chest pain lasting longer than usual, despite rest and treatment with the medications your caregiver has prescribed.  You wake from sleep with chest pain or shortness of breath. You feel dizzy or faint. You have chest pain not typical of your usual pain for which you originally saw your caregiver.  You have any other emergent concerns regarding your health.  Additional Information: Chest pain comes from many different causes. Your caregiver has diagnosed you as having chest pain that is not specific for one problem, but does not require admission.  You are at low risk for an acute heart condition or  other serious illness.   Your vital signs today were: BP 139/72   Pulse 87   Temp 97.8 F (36.6 C)   Resp (!) 24   Ht 5\' 7"  (1.702 m)   Wt 98.9 kg   LMP 06/23/2014   SpO2 99%   BMI 34.14 kg/m  If your blood pressure (BP) was elevated above 135/85 this visit, please have this repeated by your doctor within one month. --------------

## 2021-02-15 NOTE — ED Provider Notes (Signed)
[facility] EMERGENCY DEPT Provider Note   CSN: [id number] Arrival date & time: 02/15/21  1447     History Chief Complaint  Patient presents with   Chest Pain   Cough    [name] is a 60 y.o. female.  Patient with history of anxiety, known pulmonary nodules, diabetes, hyperlipidemia --presents the emergency department for chest pain.  Patient has had this for several weeks on and off.  Symptoms worsened 2 to 3 days ago and she has had more persistent symptoms.  She reports pain across her chest, more so into her right shoulder area.  She has been seen by her doctor and they changed her GERD medications.  This helped for a while but symptoms eventually returned.  She has had shortness of breath that wakes her up at night and has caused her extreme anxiety.  No associated vomiting or diaphoresis.  She has not had any lower extremity pain or swelling until this morning when she noticed a light cramp in her right leg.  She does have a strong family history of blood clots. Patient denies risk factors for pulmonary embolism including: unilateral leg swelling, history of DVT/PE/other blood clots, use of exogenous hormones, recent immobilizations, recent surgery, recent travel (>4hr segment), malignancy, hemoptysis.  When symptoms worse early this morning she took a baby aspirin and Tylenol.  This did provide some relief.  She spoke with a family member who encouraged her to come to the emergency department for evaluation.      Past Medical History:  Diagnosis Date   Abdominal pain    due to fibroids   Anxiety    Arthritis    hips and knees   Asthma    stress induced asthma   Chest pain 06/2019   Chlamydia    Chronic kidney disease    ckd stage 1 per 5-26-201 dr Cecille Rubin [facility] ov note   Depression    DM type 2 (diabetes mellitus, type 2) ([place]) dx jan 2021   Fibroid    uterine   GERD (gastroesophageal reflux disease)    Hepatic steatosis 12/05/2019    Hyperlipidemia    IBS (irritable bowel syndrome)    Liver nodule 1999   no follow up needed was due to stress   Morbid obesity ([place])    Neuropathy    both fingers and toes at times   Obesity    Protein in urine 03/2019   Trichomonas    Tubular adenoma of colon 01/2012    Patient Active Problem List   Diagnosis Date Noted   Belching 02/05/2021   Loose stools 01/11/2021   Abdominal cramping 01/11/2021   Family history of pulmonary embolism 06/27/2020   Morbid obesity ([place])    Asthma    DM type 2 (diabetes mellitus, type 2) ([place])    Post-operative state 02/28/2020   Pelvic pain 02/28/2020   Hepatic steatosis 12/05/2019   Major depression, recurrent, chronic ([place]) 07/07/2019   Abnormal EKG 07/07/2019   Benign paroxysmal positional vertigo 06/23/2019   Nausea 06/23/2019   Type 2 diabetes mellitus with hyperglycemia, without long-term current use of insulin ([place]) 06/23/2019   Sensation of fullness in right ear 06/23/2019   Postmenopausal bleeding 12/07/2018   Panic attacks 06/11/2018   Mild intermittent asthma without complication 24/26/8341   Prediabetes 01/28/2017   Obesity (BMI 30-39.9) 01/28/2017   Adjustment disorder with anxiety 09/20/2016   Fibroid 03/21/2016   Dyslipidemia 05/31/2012   VAGINAL PRURITUS 01/11/2010   KNEE PAIN, LEFT  07/20/2009   Enlarged lymph nodes 07/20/2009   INSOMNIA 02/06/2009   ANEMIA, MILD 11/16/2008   Gastroesophageal reflux disease 08/30/2008   DIZZINESS 08/30/2008   BIPOLAR DISORDER UNSPECIFIED 07/27/2007   Anxiety and depression 10/29/2006   DISORDER, DEPRESSIVE NEC 10/29/2006   ALLERGIC RHINITIS 10/29/2006   IRRITABLE BOWEL SYNDROME 10/29/2006   HYPERCHOLESTEROLEMIA, MILD 09/01/2005   DISORDER, LIVER NOS 08/02/2002    Past Surgical History:  Procedure Laterality Date   colonscopy  2017   COLPOSCOPY  1990   results were normal   CYSTOSCOPY  02/28/2020   Procedure: CYSTOSCOPY;  Surgeon: Lavonia Drafts, MD;  Location:  [facility];  Service: Gynecology;;   DILITATION & CURRETTAGE/HYSTROSCOPY WITH NOVASURE ABLATION N/A 08/23/2019   Procedure: DILATATION & CURETTAGE/DIAGNOSTIC HYSTEROSCOPY;  Surgeon: Lavonia Drafts, MD;  Location: [facility];  Service: Gynecology;  Laterality: N/A;   IR GENERIC HISTORICAL  03/21/2016   IR EMBO TUMOR ORGAN ISCHEMIA INFARCT INC GUIDE ROADMAPPING 03/21/2016 Arne Cleveland, MD [facility]   IR GENERIC HISTORICAL  03/21/2016   IR US GUIDE VASC ACCESS RIGHT 03/21/2016 Arne Cleveland, MD [facility]   IR GENERIC HISTORICAL  03/21/2016   IR ANGIOGRAM SELECTIVE EACH ADDITIONAL VESSEL 03/21/2016 Arne Cleveland, MD [facility]   IR GENERIC HISTORICAL  03/21/2016   IR ANGIOGRAM SELECTIVE EACH ADDITIONAL VESSEL 03/21/2016 Arne Cleveland, MD [facility]   IR GENERIC HISTORICAL  03/21/2016   IR ANGIOGRAM PELVIS SELECTIVE OR SUPRASELECTIVE 03/21/2016 Arne Cleveland, MD [facility]   IR GENERIC HISTORICAL  03/21/2016   IR ANGIOGRAM PELVIS SELECTIVE OR SUPRASELECTIVE 03/21/2016 Arne Cleveland, MD [facility]   IR GENERIC HISTORICAL  02/27/2016   IR RADIOLOGIST EVAL & MGMT 02/27/2016 Arne Cleveland, MD [facility]   IR GENERIC HISTORICAL  04/03/2016   IR RADIOLOGIST EVAL & MGMT 04/03/2016 Arne Cleveland, MD [facility]   IR RADIOLOGIST EVAL & MGMT  08/26/2016   labial cyst removal     ROBOTIC ASSISTED TOTAL HYSTERECTOMY Bilateral 02/28/2020   Procedure: XI ROBOTIC ASSISTED TOTAL HYSTERECTOMY AND BILATERAL SALPINGECTOMY;  Surgeon: Lavonia Drafts, MD;  Location: [facility];  Service: Gynecology;  Laterality: Bilateral;   TUBAL LIGATION     20 years ago     OB History     Gravida  4   Para  2   Term  2   Preterm  0   AB  2   Living  2      SAB  2   IAB  0   Ectopic      Multiple      Live Births  2           Family History  Problem Relation Age of Onset   Diabetes Father    Diabetes  Mother    Hypertension Mother    Deep vein thrombosis Mother    Cancer - Other Mother        peritoneal cancer   Hiatal hernia Mother    Colon cancer Maternal Grandmother    Colon polyps Maternal Grandmother    Hypertension Sister    Deep vein thrombosis Sister    Diabetes Sister    Hiatal hernia Sister    Rectal cancer Neg Hx    Stomach cancer Neg Hx    Pancreatic cancer Neg Hx    Kidney disease Neg Hx    Liver disease Neg Hx     Social History   Tobacco Use   Smoking status: Never  Smokeless tobacco: Never  Vaping Use   Vaping Use: Never used  Substance Use Topics   Alcohol use: Yes    Alcohol/week: 0.0 standard drinks    Comment: occ, not weekly   Drug use: No    Home Medications Prior to Admission medications   Medication Sig Start Date End Date Taking? Authorizing Provider  albuterol (VENTOLIN HFA) 108 (90 Base) MCG/ACT inhaler INHALE 2 PUFFS INTO THE LUNGS EVERY 6 (SIX) HOURS AS NEEDED FOR WHEEZING. 04/11/20   Henson, Vickie L, PA-C  aspirin 81 MG tablet Take 81 mg by mouth daily.    [provider]  clonazePAM (KLONOPIN) 0.5 MG tablet Take 0.5 mg by mouth daily as needed. 04/18/20   [provider]  Cyanocobalamin (B-12 PO) Take by mouth.    [provider]  dexlansoprazole (DEXILANT) 60 MG capsule Take 1 capsule (60 mg total) by mouth daily. 02/05/21   [name], [name], PA-C  diclofenac Sodium (VOLTAREN) 1 % GEL APPLY 4 G TOPICALLY 4 (FOUR) TIMES DAILY. Patient taking differently: Apply 4 g topically as needed. 08/02/19   Henson, Vickie L, PA-C  famotidine (PEPCID) 20 MG tablet Take 1 tablet (20 mg total) by mouth at bedtime. 02/05/21   [name], [name], PA-C  fluticasone (FLONASE) 50 MCG/ACT nasal spray SPRAY 2 SPRAYS INTO EACH NOSTRIL EVERY DAY 09/25/20   Henson, Vickie L, PA-C  gabapentin (NEURONTIN) 100 MG capsule Take 100 mg by mouth 3 (three) times daily. 12/15/19   [provider]  Glucose Blood (BLOOD GLUCOSE TEST STRIPS)  STRP Test once a day. Pt has a onetouch verio flex meter dx e11.9 01/03/19   Henson, Vickie L, PA-C  hyoscyamine (LEVBID) 0.375 MG 12 hr tablet Take 1 tablet (0.375 mg total) by mouth every 12 (twelve) hours as needed. 01/11/21   [name], [name], PA-C  Lancets [facility] ULTRASOFT) lancets Test once a day. Dx E11.9 pt has onetouch verio flex meter 01/03/19   Henson, Vickie L, PA-C  losartan (COZAAR) 25 MG tablet Take 25 mg by mouth daily.    [provider]  meclizine (ANTIVERT) 25 MG tablet TAKE 1 TABLET (25 MG TOTAL) BY MOUTH 2 (TWO) TIMES DAILY AS NEEDED FOR DIZZINESS. 04/17/20   Henson, Vickie L, PA-C  Melatonin 5 MG CAPS Take 5 mg by mouth at bedtime as needed (sleep).  11/27/15   [provider]  Multiple Vitamin (MULTIVITAMIN) tablet Take 1 tablet daily by mouth.    [provider]  ondansetron (ZOFRAN ODT) 4 MG disintegrating tablet Take 1 tablet (4 mg total) by mouth every 8 (eight) hours as needed for nausea or vomiting (30-60 minutes before breakfast and dinner). 02/05/21   [name], [name], PA-C  OZEMPIC, 0.25 OR 0.5 MG/DOSE, 2 MG/1.5ML SOPN INJECT 0.5 MG INTO THE SKIN ONCE A WEEK. 12/06/20   Henson, Vickie L, PA-C  pravastatin (PRAVACHOL) 20 MG tablet Take 1 tablet (20 mg total) by mouth daily. 01/17/21   [name], [name], PA-C  venlafaxine XR (EFFEXOR-XR) 150 MG 24 hr capsule Take by mouth daily. 05/22/20   [provider]  venlafaxine XR (EFFEXOR-XR) 75 MG 24 hr capsule TAKE 1 CAPSULE (75 MG TOTAL) BY MOUTH DAILY WITH BREAKFAST. Patient taking differently: Take 150 mg by mouth 2 (two) times daily after a meal. 03/08/18   Henson, Vickie L, PA-C    Allergies    Percocet [oxycodone-acetaminophen] and Lamictal [lamotrigine]  Review of Systems   Review of Systems  Constitutional:  Negative for  diaphoresis and fever.  Eyes:  Negative for redness.  Respiratory:  Positive for shortness of breath. Negative for cough.   Cardiovascular:  Positive for chest  pain. Negative for palpitations and leg swelling.  Gastrointestinal:  Negative for abdominal pain, nausea and vomiting.  Genitourinary:  Negative for dysuria.  Musculoskeletal:  Positive for myalgias. Negative for back pain and neck pain.  Skin:  Negative for rash.  Neurological:  Negative for syncope and light-headedness.  Psychiatric/Behavioral:  The patient is nervous/anxious.    Physical Exam Updated Vital Signs BP 129/85 (BP Location: Right Arm)   Pulse (!) 103   Temp 97.8 F (36.6 C)   Resp 16   Ht 5\' 7"  (1.702 m)   Wt 98.9 kg   LMP 06/23/2014   SpO2 98%   BMI 34.14 kg/m   Physical Exam Vitals and nursing note reviewed.  Constitutional:      General: She is in acute distress (Tearful).     Appearance: She is well-developed. She is not diaphoretic.  HENT:     Head: Normocephalic and atraumatic.     Nose: Nose normal.     Mouth/Throat:     Mouth: Mucous membranes are not dry.  Eyes:     Conjunctiva/sclera: Conjunctivae normal.  Neck:     Vascular: Normal carotid pulses. No JVD.     Trachea: Trachea normal. No tracheal deviation.  Cardiovascular:     Rate and Rhythm: Regular rhythm. Tachycardia present.     Pulses: No decreased pulses.          Radial pulses are 2+ on the right side and 2+ on the left side.     Heart sounds: Normal heart sounds, S1 normal and S2 normal. No murmur heard.    Comments: Heart rate 100 Pulmonary:     Effort: Pulmonary effort is normal. No respiratory distress.     Breath sounds: No wheezing.  Chest:     Chest wall: No tenderness.  Abdominal:     General: Bowel sounds are normal.     Palpations: Abdomen is soft.     Tenderness: There is abdominal tenderness (Mild bilateral upper). There is no guarding or rebound.  Musculoskeletal:        General: Normal range of motion.     Cervical back: Normal range of motion and neck supple. No muscular tenderness.     Right lower leg: No edema.     Left lower leg: No edema.  Skin:     General: Skin is warm and dry.     Coloration: Skin is not pale.  Neurological:     Mental Status: She is alert.    ED Results / Procedures / Treatments   Labs (all labs ordered are listed, but only abnormal results are displayed) Labs Reviewed  BASIC METABOLIC PANEL - Abnormal; Notable for the following components:      Result Value   Glucose, Bld 112 (*)    All other components within normal limits  CBC - Abnormal; Notable for the following components:   MCV 77.5 (*)    MCH 25.1 (*)    All other components within normal limits  HEPATIC FUNCTION PANEL - Abnormal; Notable for the following components:   AST 14 (*)    All other components within normal limits  D-DIMER, QUANTITATIVE  PREGNANCY, URINE  TROPONIN I (HIGH SENSITIVITY)  TROPONIN I (HIGH SENSITIVITY)    ED ECG REPORT   Date: 02/15/2021  Rate: 107  Rhythm: sinus tachycardia  QRS Axis: normal  Intervals: normal  ST/T Wave abnormalities: nonspecific ST/T changes  Conduction Disutrbances:none  Narrative Interpretation:   Old EKG Reviewed: changes noted, qtc shorter today  I have personally reviewed the EKG tracing and agree with the computerized printout as noted.   Radiology DG Chest 2 View  Result Date: 02/15/2021 CLINICAL DATA:  Chest pain short of breath EXAM: CHEST - 2 VIEW COMPARISON:  06/27/2020 FINDINGS: The heart size and mediastinal contours are within normal limits. Both lungs are clear. The visualized skeletal structures are unremarkable. IMPRESSION: No active cardiopulmonary disease. Electronically Signed   By: Donavan Foil M.D.   On: 02/15/2021 16:01    Procedures Procedures   Medications Ordered in ED Medications - No data to display  ED Course  I have reviewed the triage vital signs and the nursing notes.  Pertinent labs & imaging results that were available during my care of the patient were reviewed by me and considered in my medical decision making (see chart for details).  Patient  seen and examined. Plan discussed with patient.   Labs: Labs ordered in triage reviewed and are reassuring.  Second troponin pending.  Added hepatic function panel given upper abdominal pain and D-dimer.  Imaging: Chest x-ray is negative.  D-dimer to determine if CTA is needed.  Medications/Fluids: None.  Vital signs reviewed and are as follows: BP (!) 147/86   Pulse (!) 101   Temp 97.8 F (36.6 C)   Resp 16   Ht 5\' 7"  (1.702 m)   Wt 98.9 kg   LMP 06/23/2014   SpO2 97%   BMI 34.14 kg/m   Patient reevaluated.  Exam is stable.  Heart rate improved into the 80s from 100.  Her D-dimer is normal.  Second troponin is negative.  Patient updated on reassuring results.  She is requesting a breathing treatment as she feels like this will make her chest tightness and breathing better.  We will give albuterol and Atrovent prior to discharge.  Patient has an albuterol inhaler at home which she does not feel this helps as much.  Otherwise, no indications for admission today.  Encouraged PCP follow-up.  She is aware that she will need to have pulmonary nodule rechecked for stability in the next few months.  Patient was counseled to return with severe chest pain, especially if the pain is crushing or pressure-like and spreads to the arms, back, neck, or jaw, or if they have sweating, nausea, or shortness of breath with the pain. They were encouraged to call 911 with these symptoms.   The patient verbalized understanding and agreed.    MDM Rules/Calculators/A&P                           Patient with intermittent chest pains and shortness of breath.  These have been more persistent over the past several days.  Unclear etiology, possibly GI in nature.  Patient does have some risk factors.  Troponin normal x2.  EKG nonischemic.  Chest x-ray is clear.  Normal white blood cell count, liver function tests.  No focal right upper quadrant tenderness and I have low concern for cholecystitis or  cholelithiasis at this point.  Patient looks well, in no distress.  She is under a lot of stress and is very anxious.  This may be exacerbating her symptoms.  Patient seems reliable to return with worsening.  We discussed signs and symptoms to return as above.  Final Clinical Impression(s) / ED Diagnoses Final diagnoses:  Precordial pain  Shortness of breath    Rx / DC Orders ED Discharge Orders     None        Carlisle Cater, PA-C 02/15/21 2145    Lorelle Gibbs, DO 02/15/21 2356

## 2021-02-15 NOTE — ED Notes (Signed)
RT educated pt on the proper use of MDI w/spacer. Pt verbalized to this RT that she has never had a PFT/seen a pulmonologist for asthma verification/symptom treatment. RT suggested pt see pulmonologist for PFT to assess her asthma/reactive airway so that she may receive more specialized treatment and symptom control. Pts respiratory status is stable at this time w/no distress noted.

## 2021-02-15 NOTE — ED Triage Notes (Addendum)
Chest pain "off and on for a couple of weeks". Worse last night. Reports SOB also. Hx of asthma but this feels different. States she smoked hookah at the end of October during bday celebration and has had chest pain since. She took a baby aspirin and tylenol this morning around 0400

## 2021-02-19 ENCOUNTER — Other Ambulatory Visit: Payer: Self-pay | Admitting: Medical

## 2021-02-19 MED ORDER — PROMETHAZINE HCL 25 MG PO TABS: 25.0000 mg | ORAL_TABLET | Freq: Four times a day (QID) | ORAL | 0 refills | Status: DC | PRN

## 2021-03-01 ENCOUNTER — Other Ambulatory Visit: Payer: Self-pay | Admitting: Medical

## 2021-03-15 ENCOUNTER — Other Ambulatory Visit: Payer: Self-pay | Admitting: Medical

## 2021-03-20 ENCOUNTER — Telehealth: Payer: Self-pay | Admitting: Medical

## 2021-03-20 DIAGNOSIS — E1165 Type 2 diabetes mellitus with hyperglycemia: Secondary | ICD-10-CM

## 2021-03-20 MED ORDER — OZEMPIC (0.25 OR 0.5 MG/DOSE) 2 MG/1.5ML ~~LOC~~ SOPN: 0.5000 mg | PEN_INJECTOR | SUBCUTANEOUS | 2 refills | Status: DC

## 2021-03-20 NOTE — Telephone Encounter (Signed)
[facility] mail order send refill request for ozempic please send to the [id number] - [place], [place] - [address]

## 2021-04-01 DIAGNOSIS — F411 Generalized anxiety disorder: Secondary | ICD-10-CM | POA: Diagnosis not present

## 2021-04-01 DIAGNOSIS — F900 Attention-deficit hyperactivity disorder, predominantly inattentive type: Secondary | ICD-10-CM | POA: Diagnosis not present

## 2021-04-01 DIAGNOSIS — F332 Major depressive disorder, recurrent severe without psychotic features: Secondary | ICD-10-CM | POA: Diagnosis not present

## 2021-04-01 DIAGNOSIS — R69 Illness, unspecified: Secondary | ICD-10-CM | POA: Diagnosis not present

## 2021-04-15 ENCOUNTER — Other Ambulatory Visit: Payer: Self-pay

## 2021-04-15 ENCOUNTER — Ambulatory Visit (INDEPENDENT_AMBULATORY_CARE_PROVIDER_SITE_OTHER): Payer: Medicare HMO | Admitting: Medical

## 2021-04-15 VITALS — BP 130/86 | HR 100 | Temp 97.5°F | Wt 219.8 lb

## 2021-04-15 DIAGNOSIS — N644 Mastodynia: Secondary | ICD-10-CM

## 2021-04-15 DIAGNOSIS — N6452 Nipple discharge: Secondary | ICD-10-CM | POA: Diagnosis not present

## 2021-04-15 NOTE — Progress Notes (Signed)
Subjective: Chief Complaint  Patient presents with   right breast pain    Right breast pain with boil. Boil sstarted draining beginning of January   Here for right breast.  On 03/22/21 had boil on her right nipple.  The boil never bursted, but would drain.  It was like a white center but then had a knot on top and under nipple.  Was painful.   Wasn't initially red or swollen.  It seemed to improve by January 4 without having to use gauze or pad under bra.  When she would touch her breast, pain would radiate throughout the breast.    This past weekend used some heat and it helped but still gets discharge from right nipple.    No fever, no body aches or chills.    No prior nipple piercing.  No recent significant other biting or scratching nipple.  Hasn't had hx/o boils in general.   No hidradenitis issues.    No other aggravating or relieving factors. No other complaint.  Past Medical History:  Diagnosis Date   Abdominal pain    due to fibroids   Anxiety    Arthritis    hips and knees   Asthma    stress induced asthma   Chest pain 06/2019   Chlamydia    Chronic kidney disease    ckd stage 1 per 5-26-201 dr Cecille Rubin [facility] ov note   Depression    DM type 2 (diabetes mellitus, type 2) ([place]) dx jan 2021   Fibroid    uterine   GERD (gastroesophageal reflux disease)    Hepatic steatosis 12/05/2019   Hyperlipidemia    IBS (irritable bowel syndrome)    Liver nodule 1999   no follow up needed was due to stress   Morbid obesity ([place])    Neuropathy    both fingers and toes at times   Obesity    Protein in urine 03/2019   Trichomonas    Tubular adenoma of colon 01/2012   Current Outpatient Medications on File Prior to Visit  Medication Sig Dispense Refill   albuterol (VENTOLIN HFA) 108 (90 Base) MCG/ACT inhaler INHALE 2 PUFFS INTO THE LUNGS EVERY 6 (SIX) HOURS AS NEEDED FOR WHEEZING. 18 each 0   aspirin 81 MG tablet Take 81 mg by mouth daily.     clonazePAM (KLONOPIN) 0.5 MG tablet  Take 0.5 mg by mouth daily as needed.     Cyanocobalamin (B-12 PO) Take by mouth.     dexlansoprazole (DEXILANT) 60 MG capsule TAKE 1 CAPSULE BY MOUTH EVERY DAY 30 capsule 1   diclofenac Sodium (VOLTAREN) 1 % GEL APPLY 4 G TOPICALLY 4 (FOUR) TIMES DAILY. (Patient taking differently: Apply 4 g topically as needed.) 200 g 1   famotidine (PEPCID) 20 MG tablet TAKE 1 TABLET BY MOUTH EVERYDAY AT BEDTIME 30 tablet 1   fluticasone (FLONASE) 50 MCG/ACT nasal spray SPRAY 2 SPRAYS INTO EACH NOSTRIL EVERY DAY 48 mL 0   gabapentin (NEURONTIN) 100 MG capsule Take 100 mg by mouth 3 (three) times daily.     losartan (COZAAR) 25 MG tablet Take 25 mg by mouth daily.     meclizine (ANTIVERT) 25 MG tablet TAKE 1 TABLET (25 MG TOTAL) BY MOUTH 2 (TWO) TIMES DAILY AS NEEDED FOR DIZZINESS. 30 tablet 0   Melatonin 5 MG CAPS Take 5 mg by mouth at bedtime as needed (sleep).      Multiple Vitamin (MULTIVITAMIN) tablet Take 1 tablet daily by mouth.  ondansetron (ZOFRAN ODT) 4 MG disintegrating tablet Take 1 tablet (4 mg total) by mouth every 8 (eight) hours as needed for nausea or vomiting (30-60 minutes before breakfast and dinner). 20 tablet 1   pravastatin (PRAVACHOL) 20 MG tablet Take 1 tablet (20 mg total) by mouth daily. 90 tablet 1   promethazine (PHENERGAN) 25 MG tablet Take 1 tablet (25 mg total) by mouth every 6 (six) hours as needed for nausea or vomiting. 1/2-1 tablet po prn q6 hours 12 tablet 0   Semaglutide,0.25 or 0.5MG /DOS, (OZEMPIC, 0.25 OR 0.5 MG/DOSE,) 2 MG/1.5ML SOPN Inject 0.5 mg into the skin once a week. 1.5 mL 2   venlafaxine XR (EFFEXOR-XR) 150 MG 24 hr capsule Take by mouth daily.     venlafaxine XR (EFFEXOR-XR) 75 MG 24 hr capsule TAKE 1 CAPSULE (75 MG TOTAL) BY MOUTH DAILY WITH BREAKFAST. (Patient taking differently: Take 150 mg by mouth 2 (two) times daily after a meal.) 90 capsule 0   Glucose Blood (BLOOD GLUCOSE TEST STRIPS) STRP Test once a day. Pt has a onetouch verio flex meter dx e11.9 100  strip 1   hyoscyamine (LEVBID) 0.375 MG 12 hr tablet Take 1 tablet (0.375 mg total) by mouth every 12 (twelve) hours as needed. 20 tablet 1   Lancets (ONETOUCH ULTRASOFT) lancets Test once a day. Dx E11.9 pt has onetouch verio flex meter 100 each 1   No current facility-administered medications on file prior to visit.    ROS as in subjective    Objective: BP 130/86    Pulse 100    Temp (!) 97.5 F (36.4 C)    Wt 219 lb 12.8 oz (99.7 kg)    LMP 06/23/2014    BMI 34.43 kg/m   Gen: wd, wn ,nad, African American female Bilat breasts nontender, no obvious discharge, no erythema or skin changes, no mass, no lymphadenopathy.  There is a 2-3 mm diameter small area of inferior portion of right nipple suggestive of a recent nodule or pustule that is resolving.  No induration, no fluctuance, no warmth, no redness.    Exam chaperoned by nurse    Assessment: Encounter Diagnoses  Name Primary?   Nipple discharge Yes   Breast tenderness in female      Plan: We discussed symptoms and concerns.  No obvious infection today.  Labs as below.  If labs normal will likely refer to general surgery for further evaluation of ongoing intermittent nipple discharge.  [name] was seen today for right breast pain.  Diagnoses and all orders for this visit:  Nipple discharge -     Prolactin -     FSH/LH  Breast tenderness in female -     Prolactin -     FSH/LH    F/u pending labs.

## 2021-04-16 ENCOUNTER — Other Ambulatory Visit: Payer: Self-pay | Admitting: Medical

## 2021-04-16 DIAGNOSIS — N6452 Nipple discharge: Secondary | ICD-10-CM

## 2021-04-16 DIAGNOSIS — N644 Mastodynia: Secondary | ICD-10-CM

## 2021-04-16 LAB — PROLACTIN: Prolactin: 17.3 ng/mL (ref 4.8–23.3)

## 2021-04-16 LAB — FSH/LH
FSH: 56.7 m[IU]/mL
LH: 30.7 m[IU]/mL

## 2021-04-16 NOTE — Progress Notes (Signed)
orders

## 2021-04-20 ENCOUNTER — Other Ambulatory Visit: Payer: Self-pay | Admitting: Medical

## 2021-04-20 DIAGNOSIS — N6452 Nipple discharge: Secondary | ICD-10-CM

## 2021-04-20 DIAGNOSIS — N644 Mastodynia: Secondary | ICD-10-CM

## 2021-05-09 ENCOUNTER — Ambulatory Visit
Admission: RE | Admit: 2021-05-09 | Discharge: 2021-05-09 | Disposition: A | Discharge status: Home / Self Care | Payer: Medicare HMO | Source: Ambulatory Visit | Attending: Medical | Admitting: Medical

## 2021-05-09 DIAGNOSIS — N6452 Nipple discharge: Secondary | ICD-10-CM

## 2021-05-09 DIAGNOSIS — N644 Mastodynia: Secondary | ICD-10-CM

## 2021-05-09 DIAGNOSIS — R922 Inconclusive mammogram: Secondary | ICD-10-CM | POA: Diagnosis not present

## 2021-05-16 DIAGNOSIS — F332 Major depressive disorder, recurrent severe without psychotic features: Secondary | ICD-10-CM | POA: Diagnosis not present

## 2021-05-16 DIAGNOSIS — F900 Attention-deficit hyperactivity disorder, predominantly inattentive type: Secondary | ICD-10-CM | POA: Diagnosis not present

## 2021-05-16 DIAGNOSIS — F411 Generalized anxiety disorder: Secondary | ICD-10-CM | POA: Diagnosis not present

## 2021-05-16 DIAGNOSIS — R69 Illness, unspecified: Secondary | ICD-10-CM | POA: Diagnosis not present

## 2021-05-23 ENCOUNTER — Other Ambulatory Visit: Payer: Self-pay | Admitting: Medical

## 2021-05-27 ENCOUNTER — Telehealth: Payer: Self-pay | Admitting: Medical

## 2021-05-27 NOTE — Telephone Encounter (Signed)
Referral Followup °

## 2021-06-04 ENCOUNTER — Telehealth: Payer: Self-pay | Admitting: Medical

## 2021-06-04 NOTE — Chronic Care Management (AMB) (Signed)
?  Chronic Care Management  ? ?Outreach Note ? ?06/04/2021 ?Name: [name] MRN: [id number] DOB: [birth year] ? ?Referred by: [name], PA-C ?Reason for referral : No chief complaint on file. ? ? ?An unsuccessful telephone outreach was attempted today. The patient was referred to the pharmacist for assistance with care management and care coordination.  ? ?Follow Up Plan:  ? ?Tatjana Dellinger ?Upstream Scheduler  ?

## 2021-06-10 ENCOUNTER — Telehealth: Payer: Self-pay | Admitting: Medical

## 2021-06-10 DIAGNOSIS — N6489 Other specified disorders of breast: Secondary | ICD-10-CM | POA: Diagnosis not present

## 2021-06-10 NOTE — Chronic Care Management (AMB) (Signed)
?  Chronic Care Management  ? ?Outreach Note ? ?06/10/2021 ?Name: [name] MRN: [id number] DOB: [birth year] ? ?Referred by: [name], PA-C ?Reason for referral : No chief complaint on file. ? ? ?A second unsuccessful telephone outreach was attempted today. The patient was referred to pharmacist for assistance with care management and care coordination. ? ?Follow Up Plan:  ? ?[name] ?Upstream Scheduler  ?

## 2021-06-13 ENCOUNTER — Telehealth: Payer: Self-pay | Admitting: Medical

## 2021-06-13 NOTE — Chronic Care Management (AMB) (Signed)
?  Chronic Care Management  ? ?Outreach Note ? ?06/13/2021 ?Name: [name] MRN: [id number] DOB: February 02, 1961 ? ?Referred by: [name], PA-C ?Reason for referral : No chief complaint on file. ? ? ?Third unsuccessful telephone outreach was attempted today. The patient was referred to the pharmacist for assistance with care management and care coordination.  ? ?Follow Up Plan:  ? ?[name] ?Upstream Scheduler  ?

## 2021-06-13 NOTE — Progress Notes (Signed)
?  Chronic Care Management  ? ?Note ? ?06/13/2021 ?Name: [name] MRN: [id number] DOB: [birth year] ? ?[name] is a 61 y.o. year old female who is a primary care patient of Caryl Ada. I reached out to [name] by phone today in response to a referral sent by Ms. [place] PCP, [name], [name], PA-C.  ? ?Ms. Girgis was given information about Chronic Care Management services today including:  ?CCM service includes personalized support from designated clinical staff supervised by her physician, including individualized plan of care and coordination with other care providers ?24/7 contact phone numbers for assistance for urgent and routine care needs. ?Service will only be billed when office clinical staff spend 20 minutes or more in a month to coordinate care. ?Only one practitioner may furnish and bill the service in a calendar month. ?The patient may stop CCM services at any time (effective at the end of the month) by phone call to the office staff. ? ? ?Patient agreed to services and verbal consent obtained.  ? ?Follow up plan: ? ? ?Tatjana Dellinger ?Upstream Scheduler  ?

## 2021-06-14 ENCOUNTER — Ambulatory Visit: Payer: Medicare HMO | Admitting: Medical

## 2021-06-19 ENCOUNTER — Ambulatory Visit (INDEPENDENT_AMBULATORY_CARE_PROVIDER_SITE_OTHER): Payer: Medicare HMO | Admitting: Medical

## 2021-06-19 ENCOUNTER — Other Ambulatory Visit: Payer: Self-pay

## 2021-06-19 VITALS — BP 110/70 | HR 107 | Wt 223.4 lb

## 2021-06-19 DIAGNOSIS — R809 Proteinuria, unspecified: Secondary | ICD-10-CM | POA: Diagnosis not present

## 2021-06-19 DIAGNOSIS — E785 Hyperlipidemia, unspecified: Secondary | ICD-10-CM

## 2021-06-19 DIAGNOSIS — R69 Illness, unspecified: Secondary | ICD-10-CM | POA: Diagnosis not present

## 2021-06-19 DIAGNOSIS — F419 Anxiety disorder, unspecified: Secondary | ICD-10-CM

## 2021-06-19 DIAGNOSIS — E1169 Type 2 diabetes mellitus with other specified complication: Secondary | ICD-10-CM | POA: Diagnosis not present

## 2021-06-19 DIAGNOSIS — Z7185 Encounter for immunization safety counseling: Secondary | ICD-10-CM | POA: Insufficient documentation

## 2021-06-19 DIAGNOSIS — J45909 Unspecified asthma, uncomplicated: Secondary | ICD-10-CM | POA: Diagnosis not present

## 2021-06-19 DIAGNOSIS — E1165 Type 2 diabetes mellitus with hyperglycemia: Secondary | ICD-10-CM | POA: Diagnosis not present

## 2021-06-19 DIAGNOSIS — F32A Depression, unspecified: Secondary | ICD-10-CM

## 2021-06-19 DIAGNOSIS — R000 Tachycardia, unspecified: Secondary | ICD-10-CM | POA: Diagnosis not present

## 2021-06-19 DIAGNOSIS — R911 Solitary pulmonary nodule: Secondary | ICD-10-CM | POA: Insufficient documentation

## 2021-06-19 DIAGNOSIS — N181 Chronic kidney disease, stage 1: Secondary | ICD-10-CM | POA: Insufficient documentation

## 2021-06-19 MED ORDER — SEMAGLUTIDE (1 MG/DOSE) 4 MG/3ML ~~LOC~~ SOPN: 1.0000 mg | PEN_INJECTOR | SUBCUTANEOUS | 2 refills | Status: DC

## 2021-06-19 NOTE — Patient Instructions (Addendum)
Encounter Diagnoses  ?Name Primary?  ? Type 2 diabetes mellitus with hyperglycemia, without long-term current use of insulin ([place]) Yes  ? Vaccine counseling   ? Pulmonary nodule   ? Type 2 diabetes mellitus with other specified complication, without long-term current use of insulin ([place])   ? Dyslipidemia   ? CKD (chronic kidney disease) stage 1, GFR 90 ml/min or greater   ? Proteinuria, unspecified type   ? Elevated pulse rate   ? Anxiety and depression   ? Uncomplicated asthma, unspecified asthma severity, unspecified whether persistent   ? ? ? ?Recommendations: ? ?Diabetes ?Lets increase to the 1 mg Ozempic weekly injection ?Check your feet daily for sores or wounds ?See your eye doctor yearly for diabetic eye exam and make sure they send [name] a copy of this ?Work to lose weight through healthy diet and exercise ?Check sugars several days per week.  Goal is less than 130 fasting glucose ? ?Proteinuria secondary to diabetes, chronic kidney disease stage I ?Continue efforts to keep blood pressure and blood sugar under control ?Continue losartan 25 mg daily ?Due to abnormal kidney function, and in order to protect your kidneys, I recommend you avoid medications that can harm the kidneys such as ibuprofen, Aleve, Advil, Motrin, Naprosyn, or prescription anti-inflammatories which are used for pain, inflammation, and arthritis.   You should avoid dehydration which can harm the kidneys. ? ? ?Elevated pulse rate ?Consider beta-blocker medication daily to lower pulse and possibly help with anxiety ?Consider additional testing such as overnight cardiac monitoring and sleep study ?Alternatively you could go see a cardiologist for updated evaluation ?Limit caffeine and stress ?Exercise regularly as this will likely help your pulse rate as well ? ? ?High cholesterol ?Pending labs I may change you from Pravachol to Crestor ?Eat a low-cholesterol diet ? ? ?Pulmonary nodule ?I put an order for updated CT chest.  Expect a phone call  about scheduling this ? ? ?Immunization History  ?Administered Date(s) Administered  ? Influenza Whole 02/06/2009  ? Influenza,inj,Quad PF,6+ Mos 01/28/2017, 02/02/2018  ? PFIZER(Purple Top)SARS-COV-2 Vaccination 07/07/2019, 07/28/2019  ? Td 08/01/2005  ? ? ?

## 2021-06-19 NOTE — Progress Notes (Signed)
Subjective: ? [name] is a 61 y.o. female who presents for ?Chief Complaint  ?Patient presents with  ? Diabetes  ?  Diabetes  ?  ?   ?Here for med check ? ?Medical team: ?Dr. Harrie Jeans, nephrology ?Dr. Donnie Mesa, general surgery ?Dr Lavonia Drafts, gyn ?Dr. Ellouise Newer, PA, IG ?Dr. Candee Furbish, cardiology ?Dr. Izora Gala, ENT ? ? ?Diabetes-Using Ozempic 0.'5mg'$  daily.  Sometimes she forgets a dose.  There has been some issues with availability at pharmacy as well.  She does note some polyuria and polydipsia.  No blurred vision.  Checks her sugar sometimes.  Has not been checking lately.  She has been eating a little more sweets of late.  Needs renewal on test trips ? ?Not currently exercising very much.  Been dealing with some hip and knee pains.  History of bursitis in the hip ? ?Microalbuminuria-compliant with losartan 25 mg daily ? ?Hyperlipidemia-on Pravachol.  In the past has gotten aches from Pravachol.  Had similar with Lipitor.  Fasting today. ? ?On gabapentin from psychiatry for anxiety not for neuropathy but does have a history of neuropathy in the feet..  On Effexor as well ? ?No other aggravating or relieving factors.   ? ?No other c/o. ? ?Past Medical History:  ?Diagnosis Date  ? Abdominal pain   ? due to fibroids  ? Anxiety   ? Arthritis   ? hips and knees  ? Asthma   ? stress induced asthma  ? Chest pain 06/2019  ? Chlamydia   ? Chronic kidney disease   ? ckd stage 1 per 5-26-201 dr Cecille Rubin [facility] note  ? Depression   ? DM type 2 (diabetes mellitus, type 2) ([place]) dx jan 2021  ? Fibroid   ? uterine  ? GERD (gastroesophageal reflux disease)   ? Hepatic steatosis 12/05/2019  ? Hyperlipidemia   ? IBS (irritable bowel syndrome)   ? Liver nodule 1999  ? no follow up needed was due to stress  ? Morbid obesity ([place])   ? Neuropathy   ? both fingers and toes at times  ? Obesity   ? Protein in urine 03/2019  ? Trichomonas   ? Tubular adenoma of colon 01/2012  ? ?Current Outpatient  Medications on File Prior to Visit  ?Medication Sig Dispense Refill  ? albuterol (VENTOLIN HFA) 108 (90 Base) MCG/ACT inhaler INHALE 2 PUFFS INTO THE LUNGS EVERY 6 (SIX) HOURS AS NEEDED FOR WHEEZING. 18 each 0  ? aspirin 81 MG tablet Take 81 mg by mouth daily.    ? clonazePAM (KLONOPIN) 0.5 MG tablet Take 0.5 mg by mouth daily as needed.    ? Cyanocobalamin (B-12 PO) Take by mouth.    ? diclofenac Sodium (VOLTAREN) 1 % GEL APPLY 4 G TOPICALLY 4 (FOUR) TIMES DAILY. (Patient taking differently: Apply 4 g topically as needed.) 200 g 1  ? fluticasone (FLONASE) 50 MCG/ACT nasal spray SPRAY 2 SPRAYS INTO EACH NOSTRIL EVERY DAY 48 mL 0  ? gabapentin (NEURONTIN) 100 MG capsule Take 100 mg by mouth 3 (three) times daily.    ? hyoscyamine (LEVBID) 0.375 MG 12 hr tablet Take 1 tablet (0.375 mg total) by mouth every 12 (twelve) hours as needed. 20 tablet 1  ? losartan (COZAAR) 25 MG tablet Take 25 mg by mouth daily.    ? meclizine (ANTIVERT) 25 MG tablet TAKE 1 TABLET (25 MG TOTAL) BY MOUTH 2 (TWO) TIMES DAILY AS NEEDED FOR DIZZINESS. 30 tablet 0  ?  Melatonin 5 MG CAPS Take 5 mg by mouth at bedtime as needed (sleep).     ? Multiple Vitamin (MULTIVITAMIN) tablet Take 1 tablet daily by mouth.    ? ondansetron (ZOFRAN ODT) 4 MG disintegrating tablet Take 1 tablet (4 mg total) by mouth every 8 (eight) hours as needed for nausea or vomiting (30-60 minutes before breakfast and dinner). 20 tablet 1  ? pravastatin (PRAVACHOL) 20 MG tablet TAKE 1 TABLET BY MOUTH EVERY DAY 90 tablet 1  ? promethazine (PHENERGAN) 25 MG tablet Take 1 tablet (25 mg total) by mouth every 6 (six) hours as needed for nausea or vomiting. 1/2-1 tablet po prn q6 hours 12 tablet 0  ? Semaglutide,0.25 or 0.'5MG'$ /DOS, (OZEMPIC, 0.25 OR 0.5 MG/DOSE,) 2 MG/1.5ML SOPN Inject 0.5 mg into the skin once a week. 1.5 mL 2  ? venlafaxine XR (EFFEXOR-XR) 150 MG 24 hr capsule Take by mouth daily.    ? venlafaxine XR (EFFEXOR-XR) 75 MG 24 hr capsule TAKE 1 CAPSULE (75 MG TOTAL)  BY MOUTH DAILY WITH BREAKFAST. (Patient taking differently: Take 150 mg by mouth 2 (two) times daily after a meal.) 90 capsule 0  ? dexlansoprazole (DEXILANT) 60 MG capsule TAKE 1 CAPSULE BY MOUTH EVERY DAY (Patient not taking: Reported on 06/19/2021) 30 capsule 1  ? famotidine (PEPCID) 20 MG tablet TAKE 1 TABLET BY MOUTH EVERYDAY AT BEDTIME (Patient not taking: Reported on 06/19/2021) 30 tablet 1  ? Glucose Blood (BLOOD GLUCOSE TEST STRIPS) STRP Test once a day. Pt has a onetouch verio flex meter dx e11.9 100 strip 1  ? Lancets (ONETOUCH ULTRASOFT) lancets Test once a day. Dx E11.9 pt has onetouch verio flex meter 100 each 1  ? ?No current facility-administered medications on file prior to visit.  ? ? ? ?The following portions of the patient's history were reviewed and updated as appropriate: allergies, current medications, past family history, past medical history, past social history, past surgical history and problem list. ? ?ROS ?Otherwise as in subjective above ? ?Objective: ?BP 110/70   Pulse (!) 107   Wt 223 lb 6.4 oz (101.3 kg)   LMP 06/23/2014   BMI 34.99 kg/m?  ? ?Wt Readings from Last 3 Encounters:  ?06/19/21 223 lb 6.4 oz (101.3 kg)  ?04/15/21 219 lb 12.8 oz (99.7 kg)  ?02/15/21 218 lb (98.9 kg)  ? ?BP Readings from Last 3 Encounters:  ?06/19/21 110/70  ?04/15/21 130/86  ?02/15/21 139/72  ? ? ?General appearance: alert, no distress, well developed, well nourished ?HEENT: normocephalic, sclerae anicteric, conjunctiva pink and moist, nares patent, no discharge or erythema, pharynx normal ?Oral cavity: MMM, no lesions ?Neck: supple, no lymphadenopathy, no thyromegaly, no masses, no JVD or bruit ?Heart: RRR, normal S1, S2, no murmurs ?Lungs: CTA bilaterally, no wheezes, rhonchi, or rales ?Pulses: 2+ radial pulses, 2+ pedal pulses, normal cap refill ?Ext: no edema ? ?Diabetic Foot Exam - Simple   ?Simple Foot Form ?Diabetic Foot exam was performed with the following findings: Yes 06/19/2021 11:57 AM   ?Visual Inspection ?No deformities, no ulcerations, no other skin breakdown bilaterally: Yes ?Sensation Testing ?See comments: Yes ?Pulse Check ?Posterior Tibialis and Dorsalis pulse intact bilaterally: Yes ?Comments ?She can only feel 6.0 monofilament somewhat throughout both feet but mostly impaired sensation ?  ? ? ? ?Assessment: ?Encounter Diagnoses  ?Name Primary?  ? Type 2 diabetes mellitus with hyperglycemia, without long-term current use of insulin ([place]) Yes  ? Vaccine counseling   ? Pulmonary nodule   ? Type  2 diabetes mellitus with other specified complication, without long-term current use of insulin ([place])   ? Dyslipidemia   ? CKD (chronic kidney disease) stage 1, GFR 90 ml/min or greater   ? Proteinuria, unspecified type   ? Elevated pulse rate   ? Anxiety and depression   ? Uncomplicated asthma, unspecified asthma severity, unspecified whether persistent   ? Microalbuminuria   ? ? ? ?Plan: ?Diabetes ?Lets increase to the 1 mg Ozempic weekly injection ?Check your feet daily for sores or wounds ?See your eye doctor yearly for diabetic eye exam and make sure they send [name] a copy of this ?Work to lose weight through healthy diet and exercise ?Check sugars several days per week.  Goal is less than 130 fasting glucose ? ?Proteinuria secondary to diabetes, chronic kidney disease stage I ?Continue efforts to keep blood pressure and blood sugar under control ?Continue losartan 25 mg daily ?Due to abnormal kidney function, and in order to protect your kidneys, I recommend you avoid medications that can harm the kidneys such as ibuprofen, Aleve, Advil, Motrin, Naprosyn, or prescription anti-inflammatories which are used for pain, inflammation, and arthritis.   You should avoid dehydration which can harm the kidneys. ? ? ?Elevated pulse rate ?Consider beta-blocker medication daily to lower pulse and possibly help with anxiety ?Consider additional testing such as overnight cardiac monitoring and sleep  study ?Alternatively you could go see a cardiologist for updated evaluation ?Limit caffeine and stress ?Exercise regularly as this will likely help your pulse rate as well ? ? ?High cholesterol ?Pending labs I may change you from

## 2021-06-20 ENCOUNTER — Other Ambulatory Visit: Payer: Self-pay | Admitting: Medical

## 2021-06-20 DIAGNOSIS — J4520 Mild intermittent asthma, uncomplicated: Secondary | ICD-10-CM

## 2021-06-20 LAB — LIPID PANEL
Chol/HDL Ratio: 3.7 ratio (ref 0.0–4.4)
Cholesterol, Total: 223 mg/dL — ABNORMAL HIGH (ref 100–199)
HDL: 60 mg/dL (ref >39)
LDL Chol Calc (NIH): 146 mg/dL — ABNORMAL HIGH (ref 0–99)
Triglycerides: 94 mg/dL (ref 0–149)
VLDL Cholesterol Cal: 17 mg/dL (ref 5–40)

## 2021-06-20 LAB — HEMOGLOBIN A1C
Est. average glucose Bld gHb Est-mCnc: 134 mg/dL
Hgb A1c MFr Bld: 6.3 % — ABNORMAL HIGH (ref 4.8–5.6)

## 2021-06-20 MED ORDER — BLOOD GLUCOSE TEST VI STRP: ORAL_STRIP | 1 refills | Status: AC

## 2021-06-20 MED ORDER — LOSARTAN POTASSIUM 25 MG PO TABS: 25.0000 mg | ORAL_TABLET | Freq: Every day | ORAL | 3 refills | Status: DC

## 2021-06-20 MED ORDER — ALBUTEROL SULFATE HFA 108 (90 BASE) MCG/ACT IN AERS: 2.0000 | INHALATION_SPRAY | Freq: Four times a day (QID) | RESPIRATORY_TRACT | 0 refills | Status: DC | PRN

## 2021-06-20 MED ORDER — ROSUVASTATIN CALCIUM 20 MG PO TABS: 20.0000 mg | ORAL_TABLET | Freq: Every day | ORAL | 3 refills | Status: DC

## 2021-06-25 ENCOUNTER — Telehealth: Payer: Self-pay | Admitting: Pharmacist

## 2021-06-25 NOTE — Chronic Care Management (AMB) (Signed)
? ? ?Chronic Care Management ?Pharmacy Assistant  ? ?Name: [name]  MRN: [id number] DOB: 1960-06-20 ? ?Reason for Encounter: Chart prep for initial encounter with [facility] Pharmacist on 06/27/21 at 2 in office ?  ?Conditions to be addressed/monitored: ?DMII, Anxiety, Depression, Asthma, Allergic Rhinitis, and Dislipidemia ? ?Recent office visits:  ?06/19/21 [name], PA-C - Patient presented for Type 2 diabetes mellitus with hyperglycemia without long term current use of insulin and other concerns. Increased Semaglutide. ? ?04/15/21 [name], [name], PA-C - Patient presented for Nipple discharge and other concerns. No medication changes. ? ?02/05/21 TysingerCamelia Eng, PA-C - Patient presented for Irritable bowel syndrome with diarrhea and other concerns. Prescribed Dexlansoprazole & Famotidine. Stopped Azithromycin & Omeprazole. ? ?01/29/21 [name], MD - Patient presented for Acute cough. Prescribed Azithromycin. ? ?01/11/21 [name], [name], PA-C - Patient presented for loose stools and other concerns. Stopped Budesonide, Stopped Ibuprofen. ? ? ?Recent consult visits:  ?06/20/21 [name], MD  - Patient presented for Lesion of right nipple. No medication changes. ? ?01/09/21 [name] (Psychiatry) - Patient presented for Illness unspecified and other concerns. No other visit details available. ? ?Hospital visits:  ?Medication Reconciliation was completed by comparing discharge summary, patient?s EMR and Pharmacy list, and upon discussion with patient. ? ?Patient presented to [facility] ED on 02/15/21 due to Precordial pain and other concerns. Patient was present for 3 hours. ? ?New?Medications Started at [facility] Discharge:?? ?-started  ?none ? ?Medication Changes at Hospital Discharge: ?-Changed  ?none ? ?Medications Discontinued at Hospital Discharge: ?-Stopped  ?none ? ?Medications that remain the same after Hospital Discharge:??  ?-All other  medications will remain the same.   ? ?Medications: ?Outpatient Encounter Medications as of 06/25/2021  ?Medication Sig  ? rosuvastatin (CRESTOR) 20 MG tablet Take 1 tablet (20 mg total) by mouth daily.  ? albuterol (VENTOLIN HFA) 108 (90 Base) MCG/ACT inhaler Inhale 2 puffs into the lungs every 6 (six) hours as needed for wheezing.  ? aspirin 81 MG tablet Take 81 mg by mouth daily.  ? clonazePAM (KLONOPIN) 0.5 MG tablet Take 0.5 mg by mouth daily as needed.  ? Cyanocobalamin (B-12 PO) Take by mouth.  ? diclofenac Sodium (VOLTAREN) 1 % GEL APPLY 4 G TOPICALLY 4 (FOUR) TIMES DAILY. (Patient taking differently: Apply 4 g topically as needed.)  ? fluticasone (FLONASE) 50 MCG/ACT nasal spray SPRAY 2 SPRAYS INTO EACH NOSTRIL EVERY DAY  ? gabapentin (NEURONTIN) 100 MG capsule Take 100 mg by mouth 3 (three) times daily.  ? Glucose Blood (BLOOD GLUCOSE TEST STRIPS) STRP Test once a day. Pt has a onetouch verio flex meter dx e11.9  ? hyoscyamine (LEVBID) 0.375 MG 12 hr tablet Take 1 tablet (0.375 mg total) by mouth every 12 (twelve) hours as needed.  ? Lancets (ONETOUCH ULTRASOFT) lancets Test once a day. Dx E11.9 pt has onetouch verio flex meter  ? losartan (COZAAR) 25 MG tablet Take 1 tablet (25 mg total) by mouth daily.  ? Melatonin 5 MG CAPS Take 5 mg by mouth at bedtime as needed (sleep).   ? Multiple Vitamin (MULTIVITAMIN) tablet Take 1 tablet daily by mouth.  ? Semaglutide, 1 MG/DOSE, 4 MG/3ML SOPN Inject 1 mg as directed once a week.  ? venlafaxine XR (EFFEXOR-XR) 150 MG 24 hr capsule Take by mouth daily.  ? venlafaxine XR (EFFEXOR-XR) 75 MG 24 hr capsule TAKE 1 CAPSULE (75 MG TOTAL) BY MOUTH DAILY WITH BREAKFAST. (Patient taking  differently: Take 150 mg by mouth 2 (two) times daily after a meal.)  ? ?No facility-administered encounter medications on file as of 06/25/2021.  ?Fill History  : ?PRAVASTATIN SODIUM 20 MG TAB 04/20/2021 90  ? ?ALBUTEROL HFA 90 MCG INHALER 04/12/2020 25  ? ?CLONAZEPAM 0.5 MG TABLET 04/29/2021  30  ? ?DICLOFENAC SODIUM 1% GEL 07/07/2019 30  ? ?FLUTICASONE PROP 50 MCG SPRAY 08/06/2020 30  ? ?GABAPENTIN '300MG'$  CAP 06/04/2021 30  ? ?LOSARTAN POTASSIUM 25 MG TAB 05/03/2021 90  ? ?OZEMPIC '1MG'$  PEN 06/19/2021 28  ? ?VENLAFAXINE HCL ER 37.5 MG CAP 04/01/2021 30  ? ?VENLAFAXINE HCL ER 75 MG CAP 02/04/2021 30  ? ?Have you seen any other providers since your last visit? Patient reports she does see a psychiatrist. ? ?Any changes in your medications or health? Patient reports none ? ?Any side effects from any medications? Patient reports she does experience really bad headaches but is not sure what medication causes it. ? ?Do you have an symptoms or problems not managed by your medications? Patient reports no ? ?Any concerns about your health right now? Patient reports none ? ?Has your provider asked that you check blood pressure, blood sugar, or follow special diet at home? Patient reports she has not checked her sugars lately has to check on [facility] strips [facility] is [facility]. ? ?Do you get any type of exercise on a regular basis? Patient reports she does have silver sneakers has membership at the [employer], has not gone regularly does have a stationary bike, does stretching exercises, housework and walking around her neighborhood. ? ?Can you think of a goal you would like to reach for your health? Patient reports not that she can think of at this time. ? ?Do you have any problems getting your medications? Patient reports [facility] and [facility] cost of [facility]. ? ?Is there anything that you would like to discuss during the appointment? Patient reports no ? ? ?Patient aware to bring medications to appointment and has confirmed ? ? ?Care Gaps: ?AWV- 07/11/20 ?Lab Results  ?Component Value Date  ? HGBA1C 6.3 (H) 06/19/2021  ? ? ?Star Rating Drugs: ?Pravastatin 20 mg - [facility]/21 [facility] ?Losartan 25 mg - Last [facility]/03/21 [facility] ?Ozempic 1 mg - Last filled  06/19/21 [facility] ? ? ? ?Ned Clines CMA ?Clinical Pharmacist Assistant ?[phone] ? ?

## 2021-06-27 ENCOUNTER — Ambulatory Visit: Payer: Medicare HMO

## 2021-06-27 NOTE — Progress Notes (Deleted)
? ?Chronic Care Management ?Pharmacy Note ? ?06/27/2021 ?Name:  [name] MRN:  [id number] DOB:  09-Apr-1960 ? ?Summary: ?*** ? ?Recommendations/Changes made from today's visit: ?*** ? ?Plan: ?*** ? ? ?Subjective: ?[name] is an 61 y.o. year old female who is a primary patient of Monica Hurl, PA-C.  The CCM team was consulted for assistance with disease management and care coordination needs.   ? ?Engaged with patient face to face for initial visit in response to provider referral for pharmacy case management and/or care coordination services.  ? ?Consent to Services:  ?The patient was given the following information about Chronic Care Management services today, agreed to services, and gave verbal consent: 1. CCM service includes personalized support from designated clinical staff supervised by the primary care provider, including individualized plan of care and coordination with other care providers 2. 24/7 contact phone numbers for assistance for urgent and routine care needs. 3. Service will only be billed when office clinical staff spend 20 minutes or more in a month to coordinate care. 4. Only one practitioner may furnish and bill the service in a calendar month. 5.The patient may stop CCM services at any time (effective at the end of the month) by phone call to the office staff. 6. The patient will be responsible for cost sharing (co-pay) of up to 20% of the service fee (after annual deductible is met). Patient agreed to services and consent obtained. ? ?Patient Care Team: ?[name], [name] as PCP - General (Family Medicine) ?[name], MD as Consulting Physician (Nephrology) ?[name], [facility] as Pharmacist (Pharmacist) ? ?Recent office visits: ?06/19/21 Monica Hurl, PA-C - Patient presented for Type 2 diabetes mellitus with hyperglycemia without long term current use of insulin and other concerns. Increased Semaglutide. ?  ?04/15/21 [name], [name], PA-C -  Patient presented for Nipple discharge and other concerns. No medication changes. ?  ?02/05/21 TysingerCamelia Eng, PA-C - Patient presented for Irritable bowel syndrome with diarrhea and other concerns. Prescribed Dexlansoprazole & Famotidine. Stopped Azithromycin & Omeprazole. ?  ?01/29/21 Monica Lung, MD - Patient presented for Acute cough. Prescribed Azithromycin. ?  ?01/11/21 [name], [name], PA-C - Patient presented for loose stools and other concerns. Stopped Budesonide, Stopped Ibuprofen. ? ?Recent consult visits: ?06/20/21 Monica Jews, MD  - Patient presented for Lesion of right nipple. No medication changes. ?  ?01/09/21 [name] (Psychiatry) - Patient presented for Illness unspecified and other concerns. No other visit details available. ? ?Hospital visits: ?Medication Reconciliation was completed by comparing discharge summary, patient?s EMR and Pharmacy list, and upon discussion with patient. ?  ?Patient presented to [facility] ED on 02/15/21 due to Precordial pain and other concerns. Patient was present for 3 hours. ?  ?New?Medications Started at [facility] Discharge:?? ?-started  ?none ?  ?Medication Changes at Hospital Discharge: ?-Changed  ?none ?  ?Medications Discontinued at Hospital Discharge: ?-Stopped  ?none ?  ?Medications that remain the same after Hospital Discharge:??  ?-All other medications will remain the same.   ? ?Objective: ? ?Lab Results  ?Component Value Date  ? CREATININE 0.78 02/15/2021  ? BUN 12 02/15/2021  ? EGFR 76 12/12/2020  ? GFRNONAA >60 02/15/2021  ? GFRAA 98 11/24/2019  ? NA 141 02/15/2021  ? K 3.9 02/15/2021  ? CALCIUM 10.3 02/15/2021  ? CO2 27 02/15/2021  ? GLUCOSE 112 (H) 02/15/2021  ? ? ?Lab Results  ?Component Value Date/Time  ? HGBA1C  6.3 (H) 06/19/2021 11:43 AM  ? HGBA1C 6.3 (A) 12/12/2020 08:54 AM  ? HGBA1C 6.6 (A) 06/27/2020 04:16 PM  ? HGBA1C 6.7 (H) 11/24/2019 10:01 AM  ?  ?Last diabetic Eye exam:  ?Lab Results  ?Component Value  Date/Time  ? HMDIABEYEEXA No Retinopathy 07/04/2020 12:00 AM  ?  ?Last diabetic Foot exam: No results found for: HMDIABFOOTEX  ? ?Lab Results  ?Component Value Date  ? CHOL 223 (H) 06/19/2021  ? HDL 60 06/19/2021  ? LDLCALC 146 (H) 06/19/2021  ? TRIG 94 06/19/2021  ? CHOLHDL 3.7 06/19/2021  ? ? ? ?  Latest Ref Rng & Units 02/15/2021  ?  7:23 PM 12/12/2020  ?  9:35 AM 02/20/2020  ? 11:36 AM  ?Hepatic Function  ?Total Protein 6.5 - 8.1 g/dL 7.8   7.3   7.7    ?Albumin 3.5 - 5.0 g/dL 4.7   4.4   4.1    ?AST 15 - 41 U/L _0 ?ALT 0 - 44 U/L _1 ?Alk Phosphatase 38 - 126 U/L 61   86   77    ?Total Bilirubin 0.3 - 1.2 mg/dL 0.3   0.4   0.5    ?Bilirubin, Direct 0.0 - 0.2 mg/dL <0.1      ? ? ?Lab Results  ?Component Value Date/Time  ? TSH 1.980 12/12/2020 09:35 AM  ? TSH 2.120 07/07/2019 09:38 AM  ? FREET4 1.00 12/12/2020 09:35 AM  ? FREET4 0.95 07/07/2019 09:38 AM  ? ? ? ?  Latest Ref Rng & Units 02/15/2021  ?  3:13 PM 12/12/2020  ?  9:35 AM 06/27/2020  ?  4:00 PM  ?CBC  ?WBC 4.0 - 10.5 K/uL 5.7   5.9   8.3    ?Hemoglobin 12.0 - 15.0 g/dL 12.8   12.8   13.3    ?Hematocrit 36.0 - 46.0 % 39.5   38.9   41.3    ?Platelets 150 - 400 K/uL 226   236   266    ? ? ?Lab Results  ?Component Value Date/Time  ? VD25OH 37.9 08/27/2017 04:49 PM  ? ? ?Clinical ASCVD: {YES/NO:21197} ?The 10-year ASCVD risk score (Arnett DK, et al., 2019) is: 4.8% ?  Values used to calculate the score: ?    Age: 61 years ?    Sex: Female ?    Is Non-Hispanic African American: No ?    Diabetic: Yes ?    Tobacco smoker: No ?    Systolic Blood Pressure: 528 mmHg ?    Is BP treated: No ?    HDL Cholesterol: 60 mg/dL ?    Total Cholesterol: 223 mg/dL   ? ? ?  06/19/2021  ? 11:05 AM 04/15/2021  ?  3:15 PM 04/15/2021  ?  3:14 PM  ?Depression screen PHQ 2/9  ?Decreased Interest 0 0 0  ?Down, Depressed, Hopeless 0 1 0  ?PHQ - 2 Score 0 1 0  ?Altered sleeping  1   ?Tired, decreased energy  1   ?Change in appetite  1   ?Feeling bad or failure about  yourself   1   ?Trouble concentrating  1   ?Moving slowly or fidgety/restless  1   ?Suicidal thoughts  1   ?PHQ-9 Score  8   ?Difficult doing work/chores  Not difficult at all   ?  ? ?***Other: (CHADS2VASc if Afib, MMRC or  CAT for COPD, ACT, DEXA) ? ?Social History  ? ?Tobacco Use  ?Smoking Status Never  ?Smokeless Tobacco Never  ? ?BP Readings from Last 3 Encounters:  ?06/19/21 110/70  ?04/15/21 130/86  ?02/15/21 139/72  ? ?Pulse Readings from Last 3 Encounters:  ?06/19/21 (!) 107  ?04/15/21 100  ?02/15/21 87  ? ?Wt Readings from Last 3 Encounters:  ?06/19/21 223 lb 6.4 oz (101.3 kg)  ?04/15/21 219 lb 12.8 oz (99.7 kg)  ?02/15/21 218 lb (98.9 kg)  ? ?BMI Readings from Last 3 Encounters:  ?06/19/21 34.99 kg/m?  ?04/15/21 34.43 kg/m?  ?02/15/21 34.14 kg/m?  ? ? ?Assessment/Interventions: Review of patient past medical history, allergies, medications, health status, including review of consultants reports, laboratory and other test data, was performed as part of comprehensive evaluation and provision of chronic care management services.  ? ?SDOH:  (Social Determinants of Health) assessments and interventions performed: {yes/no:20286} ? ?SDOH Screenings  ? ?Alcohol Screen: Not on file  ?Depression (PHQ2-9): Low Risk   ? PHQ-2 Score: 0  ?Financial Resource Strain: Not on file  ?Food Insecurity: Not on file  ?Housing: Not on file  ?Physical Activity: Not on file  ?Social Connections: Not on file  ?Stress: Not on file  ?Tobacco Use: Low Risk   ? Smoking Tobacco Use: Never  ? Smokeless Tobacco Use: Never  ? Passive Exposure: Not on file  ?Transportation Needs: Not on file  ? ?Patient reports she does experience really bad headaches but is not sure what medication causes it. ? ?Patient reports she has not checked her sugars lately has to check on the cost of her strips from [facility] is currently out. ?  ?Patient reports she does have silver sneakers has membership at the [employer], has not gone regularly does have a  stationary bike, does stretching exercises, housework and walking around her neighborhood. ? ? ?CCM Care Plan ? ?Allergies  ?Allergen Reactions  ? Percocet [Oxycodone-Acetaminophen] Itching  ? Lamictal [Lamotrig

## 2021-07-12 ENCOUNTER — Ambulatory Visit (INDEPENDENT_AMBULATORY_CARE_PROVIDER_SITE_OTHER): Payer: Medicare HMO

## 2021-07-12 VITALS — BP 136/78 | HR 106 | Temp 98.2°F | Ht 67.5 in | Wt 222.0 lb

## 2021-07-12 DIAGNOSIS — F332 Major depressive disorder, recurrent severe without psychotic features: Secondary | ICD-10-CM | POA: Diagnosis not present

## 2021-07-12 DIAGNOSIS — Z0000 Encounter for general adult medical examination without abnormal findings: Secondary | ICD-10-CM | POA: Diagnosis not present

## 2021-07-12 DIAGNOSIS — F411 Generalized anxiety disorder: Secondary | ICD-10-CM | POA: Diagnosis not present

## 2021-07-12 DIAGNOSIS — R69 Illness, unspecified: Secondary | ICD-10-CM | POA: Diagnosis not present

## 2021-07-12 DIAGNOSIS — F900 Attention-deficit hyperactivity disorder, predominantly inattentive type: Secondary | ICD-10-CM | POA: Diagnosis not present

## 2021-07-12 NOTE — Patient Instructions (Signed)
[name] , ?Thank you for taking time to come for your Medicare Wellness Visit. I appreciate your ongoing commitment to your health goals. Please review the following plan we discussed and let me know if I can assist you in the future.  ? ?Screening recommendations/referrals: ?Colonoscopy: completed 08/10/2017, due 08/11/2027 ?Mammogram: completed 05/09/2021, due 05/09/2022 ?Bone Density: completed 08/02/2020 ?Recommended yearly ophthalmology/optometry visit for glaucoma screening and checkup ?Recommended yearly dental visit for hygiene and checkup ? ?Vaccinations: ?Influenza vaccine: due 10/22/2021 ?Pneumococcal vaccine: n/a ?Tdap vaccine: due ?Shingles vaccine: discussed  ?Covid-19:  07/28/2019, 07/07/2019,  ? ?Advanced directives: Advance directive discussed with you today. Even though you declined this today please call our office should you change your mind and we can give you the proper paperwork for you to fill out. ? ?Conditions/risks identified: none ? ?Next appointment: Follow up in one year for your annual wellness visit.  ? ?Preventive Care 40-64 Years, Female ?Preventive care refers to lifestyle choices and visits with your health care provider that can promote health and wellness. ?What does preventive care include? ?A yearly physical exam. This is also called an annual well check. ?Dental exams once or twice a year. ?Routine eye exams. Ask your health care provider how often you should have your eyes checked. ?Personal lifestyle choices, including: ?Daily care of your teeth and gums. ?Regular physical activity. ?Eating a healthy diet. ?Avoiding tobacco and drug use. ?Limiting alcohol use. ?Practicing safe sex. ?Taking low-dose aspirin daily starting at age 33. ?Taking vitamin and mineral supplements as recommended by your health care provider. ?What happens during an annual well check? ?The services and screenings done by your health care provider during your annual well check will depend on your age,  overall health, lifestyle risk factors, and family history of disease. ?Counseling  ?Your health care provider may ask you questions about your: ?Alcohol use. ?Tobacco use. ?Drug use. ?Emotional well-being. ?Home and relationship well-being. ?Sexual activity. ?Eating habits. ?Work and work Statistician. ?Method of birth control. ?Menstrual cycle. ?Pregnancy history. ?Screening  ?You may have the following tests or measurements: ?Height, weight, and BMI. ?Blood pressure. ?Lipid and cholesterol levels. These may be checked every 5 years, or more frequently if you are over 86 years old. ?Skin check. ?Lung cancer screening. You may have this screening every year starting at age 3 if you have a 30-pack-year history of smoking and currently smoke or have quit within the past 15 years. ?Fecal occult blood test (FOBT) of the stool. You may have this test every year starting at age 16. ?Flexible sigmoidoscopy or colonoscopy. You may have a sigmoidoscopy every 5 years or a colonoscopy every 10 years starting at age 5. ?Hepatitis C blood test. ?Hepatitis B blood test. ?Sexually transmitted disease (STD) testing. ?Diabetes screening. This is done by checking your blood sugar (glucose) after you have not eaten for a while (fasting). You may have this done every 1-3 years. ?Mammogram. This may be done every 1-2 years. Talk to your health care provider about when you should start having regular mammograms. This may depend on whether you have a family history of breast cancer. ?BRCA-related cancer screening. This may be done if you have a family history of breast, ovarian, tubal, or peritoneal cancers. ?Pelvic exam and Pap test. This may be done every 3 years starting at age 48. Starting at age 31, this may be done every 5 years if you have a Pap test in combination with an HPV test. ?Bone density scan. This is done  to screen for osteoporosis. You may have this scan if you are at high risk for osteoporosis. ?Discuss your test  results, treatment options, and if necessary, the need for more tests with your health care provider. ?Vaccines  ?Your health care provider may recommend certain vaccines, such as: ?Influenza vaccine. This is recommended every year. ?Tetanus, diphtheria, and acellular pertussis (Tdap, Td) vaccine. You may need a Td booster every 10 years. ?Zoster vaccine. You may need this after age 39. ?Pneumococcal 13-valent conjugate (PCV13) vaccine. You may need this if you have certain conditions and were not previously vaccinated. ?Pneumococcal polysaccharide (PPSV23) vaccine. You may need one or two doses if you smoke cigarettes or if you have certain conditions. ?Talk to your health care provider about which screenings and vaccines you need and how often you need them. ?This information is not intended to replace advice given to you by your health care provider. Make sure you discuss any questions you have with your health care provider. ?Document Released: 04/06/2015 Document Revised: 11/28/2015 Document Reviewed: 01/09/2015 ?Elsevier Interactive Patient Education ? 2017 [employer] ? ? ? ?Fall Prevention in the Home ?Falls can cause injuries. They can happen to people of all ages. There are many things you can do to make your home safe and to help prevent falls. ?What can I do on the outside of my home? ?Regularly fix the edges of walkways and driveways and fix any cracks. ?Remove anything that might make you trip as you walk through a door, such as a raised step or threshold. ?Trim any bushes or trees on the path to your home. ?Use bright outdoor lighting. ?Clear any walking paths of anything that might make someone trip, such as rocks or tools. ?Regularly check to see if handrails are loose or broken. Make sure that both sides of any steps have handrails. ?Any raised decks and porches should have guardrails on the edges. ?Have any leaves, snow, or ice cleared regularly. ?Use sand or salt on walking paths during  winter. ?Clean up any spills in your garage right away. This includes oil or grease spills. ?What can I do in the bathroom? ?Use night lights. ?Install grab bars by the toilet and in the tub and shower. Do not use towel bars as grab bars. ?Use non-skid mats or decals in the tub or shower. ?If you need to sit down in the shower, use a plastic, non-slip stool. ?Keep the floor dry. Clean up any water that spills on the floor as soon as it happens. ?Remove soap buildup in the tub or shower regularly. ?Attach bath mats securely with double-sided non-slip rug tape. ?Do not have throw rugs and other things on the floor that can make you trip. ?What can I do in the bedroom? ?Use night lights. ?Make sure that you have a light by your bed that is easy to reach. ?Do not use any sheets or blankets that are too big for your bed. They should not hang down onto the floor. ?Have a firm chair that has side arms. You can use this for support while you get dressed. ?Do not have throw rugs and other things on the floor that can make you trip. ?What can I do in the kitchen? ?Clean up any spills right away. ?Avoid walking on wet floors. ?Keep items that you use a lot in easy-to-reach places. ?If you need to reach something above you, use a strong step stool that has a grab bar. ?Keep electrical cords out of the  way. ?Do not use floor polish or wax that makes floors slippery. If you must use wax, use non-skid floor wax. ?Do not have throw rugs and other things on the floor that can make you trip. ?What can I do with my stairs? ?Do not leave any items on the stairs. ?Make sure that there are handrails on both sides of the stairs and use them. Fix handrails that are broken or loose. Make sure that handrails are as long as the stairways. ?Check any carpeting to make sure that it is firmly attached to the stairs. Fix any carpet that is loose or worn. ?Avoid having throw rugs at the top or bottom of the stairs. If you do have throw rugs,  attach them to the floor with carpet tape. ?Make sure that you have a light switch at the top of the stairs and the bottom of the stairs. If you do not have them, ask someone to add them for you. ?What else can I do to he

## 2021-07-12 NOTE — Progress Notes (Signed)
?This visit occurred during the SARS-CoV-2 public health emergency.  Safety protocols were in place, including screening questions prior to the visit, additional usage of staff PPE, and extensive cleaning of exam room while observing appropriate contact time as indicated for disinfecting solutions. ? ?Subjective:  ? [name] is a 61 y.o. female who presents for Medicare Annual (Subsequent) preventive examination. ? ?Review of Systems    ? ?Cardiac Risk Factors include: diabetes mellitus;dyslipidemia;obesity (BMI >30kg/m2) ? ?   ?Objective:  ?  ?Today's Vitals  ? 07/12/21 1144 07/12/21 1150  ?BP: 136/78   ?Pulse: (!) 106   ?Temp: 98.2 ?F (36.8 ?C)   ?TempSrc: Oral   ?SpO2: 94%   ?Weight: 222 lb (100.7 kg)   ?Height: 5' 7.5" (1.715 m)   ?PainSc:  2   ? ?Body mass index is 34.26 kg/m?. ? ? ?  07/12/2021  ? 11:55 AM 02/15/2021  ?  3:02 PM 06/27/2020  ?  3:33 PM 02/28/2020  ?  6:26 AM 02/20/2020  ? 11:14 AM 08/23/2019  ? 12:11 PM 08/10/2017  ?  1:43 PM  ?Advanced Directives  ?Does Patient Have a Medical Advance Directive? No No No No No No No  ?Would patient like information on creating a medical advance directive? No - Patient declined   No - Patient declined No - Patient declined No - Patient declined   ? ? ?Current Medications (verified) ?Outpatient Encounter Medications as of 07/12/2021  ?Medication Sig  ? albuterol (VENTOLIN HFA) 108 (90 Base) MCG/ACT inhaler Inhale 2 puffs into the lungs every 6 (six) hours as needed for wheezing.  ? aspirin 81 MG tablet Take 81 mg by mouth daily.  ? clonazePAM (KLONOPIN) 0.5 MG tablet Take 0.5 mg by mouth daily as needed.  ? Cyanocobalamin (B-12 PO) Take by mouth.  ? diclofenac Sodium (VOLTAREN) 1 % GEL APPLY 4 G TOPICALLY 4 (FOUR) TIMES DAILY. (Patient taking differently: Apply 4 g topically as needed.)  ? fluticasone (FLONASE) 50 MCG/ACT nasal spray SPRAY 2 SPRAYS INTO EACH NOSTRIL EVERY DAY  ? gabapentin (NEURONTIN) 100 MG capsule Take 100 mg by mouth 3 (three) times daily.   ? Glucose Blood (BLOOD GLUCOSE TEST STRIPS) STRP Test once a day. Pt has a onetouch verio flex meter dx e11.9  ? hyoscyamine (LEVBID) 0.375 MG 12 hr tablet Take 1 tablet (0.375 mg total) by mouth every 12 (twelve) hours as needed.  ? Lancets (ONETOUCH ULTRASOFT) lancets Test once a day. Dx E11.9 pt has onetouch verio flex meter  ? losartan (COZAAR) 25 MG tablet Take 1 tablet (25 mg total) by mouth daily.  ? Melatonin 5 MG CAPS Take 5 mg by mouth at bedtime as needed (sleep).   ? Multiple Vitamin (MULTIVITAMIN) tablet Take 1 tablet daily by mouth.  ? rosuvastatin (CRESTOR) 20 MG tablet Take 1 tablet (20 mg total) by mouth daily.  ? Semaglutide, 1 MG/DOSE, 4 MG/3ML SOPN Inject 1 mg as directed once a week.  ? venlafaxine XR (EFFEXOR-XR) 150 MG 24 hr capsule Take by mouth daily.  ? venlafaxine XR (EFFEXOR-XR) 75 MG 24 hr capsule TAKE 1 CAPSULE (75 MG TOTAL) BY MOUTH DAILY WITH BREAKFAST. (Patient taking differently: Take 150 mg by mouth 2 (two) times daily after a meal.)  ? ?No facility-administered encounter medications on file as of 07/12/2021.  ? ? ?Allergies (verified) ?Percocet [oxycodone-acetaminophen] and Lamictal [lamotrigine]  ? ?History: ?Past Medical History:  ?Diagnosis Date  ? Abdominal pain   ? due to fibroids  ?  Anxiety   ? Arthritis   ? hips and knees  ? Asthma   ? stress induced asthma  ? Chest pain 06/2019  ? Chlamydia   ? Chronic kidney disease   ? ckd stage 1 per 5-26-201 dr Cecille Rubin [facility] note  ? Depression   ? DM type 2 (diabetes mellitus, type 2) ([place]) dx jan 2021  ? Fibroid   ? uterine  ? GERD (gastroesophageal reflux disease)   ? Hepatic steatosis 12/05/2019  ? Hyperlipidemia   ? IBS (irritable bowel syndrome)   ? Liver nodule 1999  ? no follow up needed was due to stress  ? Morbid obesity ([place])   ? Neuropathy   ? both fingers and toes at times  ? Obesity   ? Protein in urine 03/2019  ? Trichomonas   ? Tubular adenoma of colon 01/2012  ? ?Past Surgical History:  ?Procedure Laterality Date  ?  colonscopy  2017  ? COLPOSCOPY  1990  ? results were normal  ? CYSTOSCOPY  02/28/2020  ? Procedure: CYSTOSCOPY;  Surgeon: Lavonia Drafts, MD;  Location: [facility];  Service: Gynecology;;  ? [place] N/A 08/23/2019  ? Procedure: DILATATION & CURETTAGE/DIAGNOSTIC HYSTEROSCOPY;  Surgeon: Lavonia Drafts, MD;  Location: [facility];  Service: Gynecology;  Laterality: N/A;  ? IR GENERIC HISTORICAL  03/21/2016  ? IR EMBO TUMOR ORGAN ISCHEMIA INFARCT INC GUIDE ROADMAPPING 03/21/2016 Arne Cleveland, MD [facility]  ? IR GENERIC HISTORICAL  03/21/2016  ? IR US GUIDE VASC ACCESS RIGHT 03/21/2016 Arne Cleveland, MD [facility]  ? IR GENERIC HISTORICAL  03/21/2016  ? IR ANGIOGRAM SELECTIVE EACH ADDITIONAL VESSEL 03/21/2016 Arne Cleveland, MD [facility]  ? IR GENERIC HISTORICAL  03/21/2016  ? IR ANGIOGRAM SELECTIVE EACH ADDITIONAL VESSEL 03/21/2016 Arne Cleveland, MD [facility]  ? IR GENERIC HISTORICAL  03/21/2016  ? IR ANGIOGRAM PELVIS SELECTIVE OR SUPRASELECTIVE 03/21/2016 Arne Cleveland, MD [facility]  ? IR GENERIC HISTORICAL  03/21/2016  ? IR ANGIOGRAM PELVIS SELECTIVE OR SUPRASELECTIVE 03/21/2016 Arne Cleveland, MD [facility]  ? IR GENERIC HISTORICAL  02/27/2016  ? IR RADIOLOGIST EVAL & MGMT 02/27/2016 Arne Cleveland, MD [facility]  ? IR GENERIC HISTORICAL  04/03/2016  ? IR RADIOLOGIST EVAL & MGMT 04/03/2016 Arne Cleveland, MD [facility]  ? IR RADIOLOGIST EVAL & MGMT  08/26/2016  ? labial cyst removal    ? ROBOTIC ASSISTED TOTAL HYSTERECTOMY Bilateral 02/28/2020  ? Procedure: XI ROBOTIC ASSISTED TOTAL HYSTERECTOMY AND BILATERAL SALPINGECTOMY;  Surgeon: Lavonia Drafts, MD;  Location: [place];  Service: Gynecology;  Laterality: Bilateral;  ? TUBAL LIGATION    ? 20 years ago  ? ?Family History  ?Problem Relation Age of Onset  ? Diabetes Father   ? Diabetes Mother   ? Hypertension  Mother   ? Deep vein thrombosis Mother   ? Cancer - Other Mother   ?     peritoneal cancer  ? Hiatal hernia Mother   ? Colon cancer Maternal Grandmother   ? Colon polyps Maternal Grandmother   ? Hypertension Sister   ? Deep vein thrombosis Sister   ? Diabetes Sister   ? Hiatal hernia Sister   ? Rectal cancer Neg Hx   ? Stomach cancer Neg Hx   ? Pancreatic cancer Neg Hx   ? Kidney disease Neg Hx   ? Liver disease Neg Hx   ? ?Social History  ? ?Socioeconomic History  ?  Marital status: Divorced  ?  Spouse name: Not on file  ? Number of children: 2  ? Years of education: Not on file  ? Highest education level: Not on file  ?Occupational History  ? Not on file  ?Tobacco Use  ? Smoking status: Never  ? Smokeless tobacco: Never  ?Vaping Use  ? Vaping Use: Never used  ?Substance and Sexual Activity  ? Alcohol use: Not Currently  ?  Comment: occ, not weekly  ? Drug use: No  ? Sexual activity: Yes  ?Other Topics Concern  ? Not on file  ?Social History Narrative  ? Not on file  ? ?Social Determinants of Health  ? ?Financial Resource Strain: Low Risk   ? Difficulty of Paying Living Expenses: Not hard at all  ?Food Insecurity: No Food Insecurity  ? Worried About Charity fundraiser in the Last Year: Never true  ? Ran Out of Food in the Last Year: Never true  ?Transportation Needs: No Transportation Needs  ? Lack of Transportation (Medical): No  ? Lack of Transportation (Non-Medical): No  ?Physical Activity: Insufficiently Active  ? Days of Exercise per Week: 3 days  ? Minutes of Exercise per Session: 10 min  ?Stress: Stress Concern Present  ? Feeling of Stress : To some extent  ?Social Connections: Not on file  ? ? ?Tobacco Counseling ?Counseling given: Not Answered ? ? ?Clinical Intake: ? ?Pre-visit preparation completed: Yes ? ?Pain : 0-10 ?Pain Score: 2  ?Pain Type: Chronic pain ?Pain Location: Hip ?Pain Orientation: Right ?Pain Radiating Towards: down leg ?Pain Descriptors / Indicators: Shooting ?Pain Onset: More than a  month ago ?Pain Frequency: Intermittent ? ?  ? ?Nutritional Status: BMI > 30  Obese ?Nutritional Risks: None ?Diabetes: Yes ? ?How often do you need to have someone help you when you read instructions, pamphle

## 2021-07-15 ENCOUNTER — Ambulatory Visit
Admission: RE | Admit: 2021-07-15 | Discharge: 2021-07-15 | Disposition: A | Discharge status: Home / Self Care | Payer: Medicare HMO | Source: Ambulatory Visit | Attending: Medical | Admitting: Medical

## 2021-07-15 DIAGNOSIS — R911 Solitary pulmonary nodule: Secondary | ICD-10-CM | POA: Diagnosis not present

## 2021-07-17 ENCOUNTER — Ambulatory Visit (INDEPENDENT_AMBULATORY_CARE_PROVIDER_SITE_OTHER): Payer: Medicare HMO | Admitting: Medical

## 2021-07-17 VITALS — BP 120/70 | HR 95 | Ht 67.0 in | Wt 222.8 lb

## 2021-07-17 DIAGNOSIS — N181 Chronic kidney disease, stage 1: Secondary | ICD-10-CM

## 2021-07-17 DIAGNOSIS — R911 Solitary pulmonary nodule: Secondary | ICD-10-CM

## 2021-07-17 DIAGNOSIS — R69 Illness, unspecified: Secondary | ICD-10-CM | POA: Diagnosis not present

## 2021-07-17 DIAGNOSIS — Z8249 Family history of ischemic heart disease and other diseases of the circulatory system: Secondary | ICD-10-CM | POA: Diagnosis not present

## 2021-07-17 DIAGNOSIS — Z7185 Encounter for immunization safety counseling: Secondary | ICD-10-CM

## 2021-07-17 DIAGNOSIS — E1165 Type 2 diabetes mellitus with hyperglycemia: Secondary | ICD-10-CM | POA: Diagnosis not present

## 2021-07-17 DIAGNOSIS — E669 Obesity, unspecified: Secondary | ICD-10-CM

## 2021-07-17 DIAGNOSIS — Z0000 Encounter for general adult medical examination without abnormal findings: Secondary | ICD-10-CM | POA: Diagnosis not present

## 2021-07-17 DIAGNOSIS — K580 Irritable bowel syndrome with diarrhea: Secondary | ICD-10-CM

## 2021-07-17 DIAGNOSIS — Z139 Encounter for screening, unspecified: Secondary | ICD-10-CM

## 2021-07-17 DIAGNOSIS — E785 Hyperlipidemia, unspecified: Secondary | ICD-10-CM

## 2021-07-17 DIAGNOSIS — G4700 Insomnia, unspecified: Secondary | ICD-10-CM

## 2021-07-17 DIAGNOSIS — F32A Depression, unspecified: Secondary | ICD-10-CM

## 2021-07-17 DIAGNOSIS — K219 Gastro-esophageal reflux disease without esophagitis: Secondary | ICD-10-CM | POA: Diagnosis not present

## 2021-07-17 DIAGNOSIS — E1169 Type 2 diabetes mellitus with other specified complication: Secondary | ICD-10-CM | POA: Diagnosis not present

## 2021-07-17 DIAGNOSIS — R809 Proteinuria, unspecified: Secondary | ICD-10-CM

## 2021-07-17 DIAGNOSIS — Z111 Encounter for screening for respiratory tuberculosis: Secondary | ICD-10-CM | POA: Diagnosis not present

## 2021-07-17 DIAGNOSIS — J4520 Mild intermittent asthma, uncomplicated: Secondary | ICD-10-CM

## 2021-07-17 DIAGNOSIS — F419 Anxiety disorder, unspecified: Secondary | ICD-10-CM

## 2021-07-17 DIAGNOSIS — E559 Vitamin D deficiency, unspecified: Secondary | ICD-10-CM | POA: Diagnosis not present

## 2021-07-17 DIAGNOSIS — K760 Fatty (change of) liver, not elsewhere classified: Secondary | ICD-10-CM

## 2021-07-17 DIAGNOSIS — F319 Bipolar disorder, unspecified: Secondary | ICD-10-CM

## 2021-07-17 DIAGNOSIS — Z832 Family history of diseases of the blood and blood-forming organs and certain disorders involving the immune mechanism: Secondary | ICD-10-CM

## 2021-07-17 DIAGNOSIS — F410 Panic disorder [episodic paroxysmal anxiety] without agoraphobia: Secondary | ICD-10-CM

## 2021-07-17 NOTE — Assessment & Plan Note (Signed)
No recent complaints, continue current therapy ?

## 2021-07-17 NOTE — Assessment & Plan Note (Signed)
Continue Ozempic 1 mg weekly, ?

## 2021-07-17 NOTE — Assessment & Plan Note (Signed)
History of stress-induced symptoms.  Uses albuterol as needed.  No recent problems ?

## 2021-07-17 NOTE — Assessment & Plan Note (Signed)
Managed by psychiatry 

## 2021-07-17 NOTE — Progress Notes (Signed)
Subjective:  ? ?HPI ? [name] is a 61 y.o. female who presents for ?Chief Complaint  ?Patient presents with  ? fasting cpe  ?  Fasting cpe, has form filled out for work. Needs to be screened for protein s deficiency. Obgyn- Timmothy Euler ?  ? ? ?Patient Care Team: ?Jarman Litton, [name] as PCP - General (Family Medicine) ?[name], MD as Consulting Physician (Nephrology) ?[name], [facility] as Pharmacist (Pharmacist) ?Sees dentist ?Dr. Lucio Edward, GI ?Tawni Millers, optometry ?Dr. Donnie Mesa, general surgery ?Dr. Lavonia Drafts, og/gyn ?Dr. Harrie Jeans, nephrology, [facility] ?Dr. [name], psychiatry ? ? ?Concerns: ?No current issues. ? ?Doing some exercises calisthenics not much aerobic though. ? ?No recent problems with stress-induced asthma.  Has not used inhaler lately. ? ?Compliant with medications which were reviewed ? ?Reviewed their medical, surgical, family, social, medication, and allergy history and updated chart as appropriate. ? ?Past Medical History:  ?Diagnosis Date  ? Abdominal pain   ? due to fibroids  ? Anxiety   ? Arthritis   ? hips and knees  ? Asthma   ? stress induced asthma  ? Chest pain 06/2019  ? Chlamydia   ? Chronic kidney disease   ? ckd stage 1 per 5-26-201 dr Cecille Rubin [facility] note  ? Depression   ? DM type 2 (diabetes mellitus, type 2) ([place]) dx jan 2021  ? Fibroid   ? uterine  ? GERD (gastroesophageal reflux disease)   ? Hepatic steatosis 12/05/2019  ? Hyperlipidemia   ? IBS (irritable bowel syndrome)   ? Liver nodule 1999  ? no follow up needed was due to stress  ? Morbid obesity ([place])   ? Neuropathy   ? both fingers and toes at times  ? Obesity   ? Protein in urine 03/2019  ? Trichomonas   ? Tubular adenoma of colon 01/2012  ? ? ?Family History  ?Problem Relation Age of Onset  ? Diabetes Father   ? Diabetes Mother   ? Hypertension Mother   ? Deep vein thrombosis Mother   ? Cancer - Other Mother   ?     peritoneal cancer  ? Hiatal  hernia Mother   ? Colon cancer Maternal Grandmother   ? Colon polyps Maternal Grandmother   ? Hypertension Sister   ? Deep vein thrombosis Sister   ? Diabetes Sister   ? Hiatal hernia Sister   ? Rectal cancer Neg Hx   ? Stomach cancer Neg Hx   ? Pancreatic cancer Neg Hx   ? Kidney disease Neg Hx   ? Liver disease Neg Hx   ? ? ? ?Current Outpatient Medications:  ?  albuterol (VENTOLIN HFA) 108 (90 Base) MCG/ACT inhaler, Inhale 2 puffs into the lungs every 6 (six) hours as needed for wheezing., Disp: 18 each, Rfl: 0 ?  aspirin 81 MG tablet, Take 81 mg by mouth daily., Disp: , Rfl:  ?  clonazePAM (KLONOPIN) 0.5 MG tablet, Take 0.5 mg by mouth daily as needed., Disp: , Rfl:  ?  Cyanocobalamin (B-12 PO), Take by mouth., Disp: , Rfl:  ?  diclofenac Sodium (VOLTAREN) 1 % GEL, APPLY 4 G TOPICALLY 4 (FOUR) TIMES DAILY. (Patient taking differently: Apply 4 g topically as needed.), Disp: 200 g, Rfl: 1 ?  fluticasone (FLONASE) 50 MCG/ACT nasal spray, SPRAY 2 SPRAYS INTO EACH NOSTRIL EVERY DAY, Disp: 48 mL, Rfl: 0 ?  gabapentin (NEURONTIN) 100 MG capsule, Take 100 mg by mouth  3 (three) times daily., Disp: , Rfl:  ?  losartan (COZAAR) 25 MG tablet, Take 1 tablet (25 mg total) by mouth daily., Disp: 90 tablet, Rfl: 3 ?  Melatonin 5 MG CAPS, Take 5 mg by mouth at bedtime as needed (sleep). , Disp: , Rfl:  ?  Multiple Vitamin (MULTIVITAMIN) tablet, Take 1 tablet daily by mouth., Disp: , Rfl:  ?  rosuvastatin (CRESTOR) 20 MG tablet, Take 1 tablet (20 mg total) by mouth daily., Disp: 90 tablet, Rfl: 3 ?  Semaglutide, 1 MG/DOSE, 4 MG/3ML SOPN, Inject 1 mg as directed once a week., Disp: 3 mL, Rfl: 2 ?  venlafaxine XR (EFFEXOR-XR) 150 MG 24 hr capsule, Take 150 mg by mouth daily with breakfast., Disp: , Rfl:  ?  venlafaxine XR (EFFEXOR-XR) 75 MG 24 hr capsule, TAKE 1 CAPSULE (75 MG TOTAL) BY MOUTH DAILY WITH BREAKFAST. (Patient taking differently: Take 75 mg by mouth daily in the afternoon.), Disp: 90 capsule, Rfl: 0 ?  zolpidem  (AMBIEN) 10 MG tablet, Take 10 mg by mouth at bedtime as needed for sleep., Disp: , Rfl:  ?  Glucose Blood (BLOOD GLUCOSE TEST STRIPS) STRP, Test once a day. Pt has a onetouch verio flex meter dx e11.9, Disp: 100 strip, Rfl: 1 ?  hyoscyamine (LEVBID) 0.375 MG 12 hr tablet, Take 1 tablet (0.375 mg total) by mouth every 12 (twelve) hours as needed., Disp: 20 tablet, Rfl: 1 ?  Lancets (ONETOUCH ULTRASOFT) lancets, Test once a day. Dx E11.9 pt has onetouch verio flex meter, Disp: 100 each, Rfl: 1 ? ?Allergies  ?Allergen Reactions  ? Percocet [Oxycodone-Acetaminophen] Itching  ? Lamictal [Lamotrigine] Rash  ? ? ? ?Review of Systems ?Constitutional: -fever, -chills, -sweats, -unexpected weight change, -decreased appetite, -fatigue ?Allergy: -sneezing, -itching, -congestion ?Dermatology: -changing moles, --rash, -lumps ?ENT: -runny nose, -ear pain, -sore throat, -hoarseness, -sinus pain, -teeth pain, - ringing in ears, -hearing loss, -nosebleeds ?Cardiology: -chest pain, -palpitations, -swelling, -difficulty breathing when lying flat, -waking up short of breath ?Respiratory: -cough, -shortness of breath, -difficulty breathing with exercise or exertion, -wheezing, -coughing up blood ?Gastroenterology: -abdominal pain, -nausea, -vomiting, -diarrhea, -constipation, -blood in stool, -changes in bowel movement, -difficulty swallowing or eating ?Hematology: -bleeding, -bruising  ?Musculoskeletal: -joint aches, -muscle aches, -joint swelling, -back pain, -neck pain, -cramping, -changes in gait ?Ophthalmology: denies vision changes, eye redness, itching, discharge ?Urology: -burning with urination, -difficulty urinating, -blood in urine, -urinary frequency, -urgency, -incontinence ?Neurology: -headache, -weakness, -tingling, -numbness, -memory loss, -falls, -dizziness ?Psychology: -depressed mood, -agitation, -sleep problems ?Breast/gyn: -breast tendnerss, -discharge, -lumps, -vaginal discharge,- irregular periods, -heavy  periods ? ? ? ?  07/12/2021  ? 11:57 AM 06/19/2021  ? 11:05 AM 04/15/2021  ?  3:15 PM 04/15/2021  ?  3:14 PM 01/11/2021  ?  9:10 AM  ?Depression screen PHQ 2/9  ?Decreased Interest 0 0 0 0 1  ?Down, Depressed, Hopeless 0 0 1 0 1  ?PHQ - 2 Score 0 0 1 0 2  ?Altered sleeping '3  1  1  '$ ?Tired, decreased energy '1  1  1  '$ ?Change in appetite 0  1  1  ?Feeling bad or failure about yourself  0  1  1  ?Trouble concentrating 0  1  1  ?Moving slowly or fidgety/restless 0  1  0  ?Suicidal thoughts 0  1  0  ?PHQ-9 Score '4  8  7  '$ ?Difficult doing work/chores Not difficult at all  Not difficult at all  Extremely dIfficult  ? ? ?   ?  Objective:  ?BP 120/70   Pulse 95   Ht '5\' 7"'$  (1.702 m)   Wt 222 lb 12.8 oz (101.1 kg)   LMP 06/23/2014   BMI 34.90 kg/m?  ? ?General appearance: alert, no distress, WD/WN, African American female ?Skin: unremarkable ?HEENT: normocephalic, conjunctiva/corneas normal, sclerae anicteric, PERRLA, EOMi, nares patent, no discharge or erythema, pharynx normal ?Oral cavity: MMM, tongue normal, teeth in good repair ?Neck: supple, no lymphadenopathy, no thyromegaly, no masses, normal ROM, no bruits ?Chest: non tender, normal shape and expansion ?Heart: RRR, normal S1, S2, no murmurs ?Lungs: CTA bilaterally, no wheezes, rhonchi, or rales ?Abdomen: +bs, soft, non tender, non distended, no masses, no hepatomegaly, no splenomegaly, no bruits ?Back: non tender, normal ROM, no scoliosis ?Musculoskeletal: upper extremities non tender, no obvious deformity, normal ROM throughout, lower extremities non tender, no obvious deformity, normal ROM throughout ?Extremities: no edema, no cyanosis, no clubbing ?Pulses: 2+ symmetric, upper and lower extremities, normal cap refill ?Neurological: alert, oriented x 3, CN2-12 intact, strength normal upper extremities and lower extremities, sensation normal throughout, DTRs 2+ throughout, no cerebellar signs, gait normal ?Psychiatric: normal affect, behavior normal, pleasant   ?Breast/gyn/rectal - deferred to gynecology  ? ? ? ?Assessment and Plan :  ? ?Encounter Diagnoses  ?Name Primary?  ? Encounter for health maintenance examination in adult Yes  ? Vaccine counseling   ? Type 2 diabetes mellitus with hype

## 2021-07-17 NOTE — Assessment & Plan Note (Signed)
Continue efforts to keep good sugar control and good blood pressure control, on losartan ?

## 2021-07-17 NOTE — Assessment & Plan Note (Signed)
On Ambien and melatonin per psychiatry ?

## 2021-07-17 NOTE — Assessment & Plan Note (Signed)
Continue rosuvastatin 20 mg daily. 

## 2021-07-18 ENCOUNTER — Other Ambulatory Visit: Payer: Self-pay | Admitting: Medical

## 2021-07-18 MED ORDER — VITAMIN D 50 MCG (2000 UT) PO CAPS: 1.0000 | ORAL_CAPSULE | Freq: Every day | ORAL | 3 refills | Status: AC

## 2021-07-19 ENCOUNTER — Encounter: Payer: Self-pay | Admitting: Internal Medicine

## 2021-07-21 LAB — COMPREHENSIVE METABOLIC PANEL WITH GFR
ALT: 11 [IU]/L (ref 0–32)
AST: 18 [IU]/L (ref 0–40)
Albumin/Globulin Ratio: 1.6 (ref 1.2–2.2)
Albumin: 4.4 g/dL (ref 3.8–4.9)
Alkaline Phosphatase: 83 [IU]/L (ref 44–121)
BUN/Creatinine Ratio: 10 — ABNORMAL LOW (ref 12–28)
BUN: 9 mg/dL (ref 8–27)
Bilirubin Total: 0.2 mg/dL (ref 0.0–1.2)
CO2: 22 mmol/L (ref 20–29)
Calcium: 9.4 mg/dL (ref 8.7–10.3)
Chloride: 102 mmol/L (ref 96–106)
Creatinine, Ser: 0.86 mg/dL (ref 0.57–1.00)
Globulin, Total: 2.8 g/dL (ref 1.5–4.5)
Glucose: 91 mg/dL (ref 70–99)
Potassium: 4.2 mmol/L (ref 3.5–5.2)
Sodium: 140 mmol/L (ref 134–144)
Total Protein: 7.2 g/dL (ref 6.0–8.5)
eGFR: 77 mL/min/{1.73_m2}

## 2021-07-21 LAB — QUANTIFERON-TB GOLD PLUS
QuantiFERON Mitogen Value: >10 IU/mL
QuantiFERON Nil Value: 0.02 IU/mL
QuantiFERON TB1 Ag Value: 0.2 IU/mL
QuantiFERON TB2 Ag Value: 0.42 IU/mL
QuantiFERON-TB Gold Plus: POSITIVE — AB

## 2021-07-21 LAB — MEASLES/MUMPS/RUBELLA IMMUNITY
MUMPS ABS, IGG: 38.3 [AU]/ml
RUBEOLA AB, IGG: 50.9 [AU]/ml
Rubella Antibodies, IGG: 25.9 {index}

## 2021-07-21 LAB — HEPATITIS B SURFACE ANTIGEN: Hepatitis B Surface Ag: NEGATIVE

## 2021-07-21 LAB — VITAMIN D 25 HYDROXY (VIT D DEFICIENCY, FRACTURES): Vit D, 25-Hydroxy: 15.6 ng/mL — ABNORMAL LOW (ref 30.0–100.0)

## 2021-07-21 LAB — HEPATITIS B SURFACE ANTIBODY, QUANTITATIVE: Hepatitis B Surf Ab Quant: <3.1 m[IU]/mL — ABNORMAL LOW (ref >9.9)

## 2021-07-23 ENCOUNTER — Other Ambulatory Visit: Payer: Self-pay | Admitting: Internal Medicine

## 2021-07-23 DIAGNOSIS — R7612 Nonspecific reaction to cell mediated immunity measurement of gamma interferon antigen response without active tuberculosis: Secondary | ICD-10-CM

## 2021-07-24 ENCOUNTER — Other Ambulatory Visit (INDEPENDENT_AMBULATORY_CARE_PROVIDER_SITE_OTHER): Payer: Medicare HMO

## 2021-07-24 ENCOUNTER — Ambulatory Visit
Admission: RE | Admit: 2021-07-24 | Discharge: 2021-07-24 | Disposition: A | Discharge status: Home / Self Care | Payer: Medicare HMO | Source: Ambulatory Visit | Attending: Medical | Admitting: Medical

## 2021-07-24 DIAGNOSIS — R7612 Nonspecific reaction to cell mediated immunity measurement of gamma interferon antigen response without active tuberculosis: Secondary | ICD-10-CM

## 2021-07-24 DIAGNOSIS — R7611 Nonspecific reaction to tuberculin skin test without active tuberculosis: Secondary | ICD-10-CM | POA: Diagnosis not present

## 2021-07-24 DIAGNOSIS — Z23 Encounter for immunization: Secondary | ICD-10-CM | POA: Diagnosis not present

## 2021-07-31 DIAGNOSIS — N181 Chronic kidney disease, stage 1: Secondary | ICD-10-CM | POA: Diagnosis not present

## 2021-08-01 DIAGNOSIS — R7612 Nonspecific reaction to cell mediated immunity measurement of gamma interferon antigen response without active tuberculosis: Secondary | ICD-10-CM | POA: Diagnosis not present

## 2021-08-01 DIAGNOSIS — Z111 Encounter for screening for respiratory tuberculosis: Secondary | ICD-10-CM | POA: Diagnosis not present

## 2021-08-08 DIAGNOSIS — E1122 Type 2 diabetes mellitus with diabetic chronic kidney disease: Secondary | ICD-10-CM | POA: Diagnosis not present

## 2021-08-08 DIAGNOSIS — R809 Proteinuria, unspecified: Secondary | ICD-10-CM | POA: Diagnosis not present

## 2021-08-08 DIAGNOSIS — N181 Chronic kidney disease, stage 1: Secondary | ICD-10-CM | POA: Diagnosis not present

## 2021-08-08 DIAGNOSIS — I129 Hypertensive chronic kidney disease with stage 1 through stage 4 chronic kidney disease, or unspecified chronic kidney disease: Secondary | ICD-10-CM | POA: Diagnosis not present

## 2021-08-08 DIAGNOSIS — K219 Gastro-esophageal reflux disease without esophagitis: Secondary | ICD-10-CM | POA: Diagnosis not present

## 2021-08-14 ENCOUNTER — Encounter: Payer: Self-pay | Admitting: Medical

## 2021-08-14 DIAGNOSIS — E559 Vitamin D deficiency, unspecified: Secondary | ICD-10-CM | POA: Insufficient documentation

## 2021-08-27 ENCOUNTER — Other Ambulatory Visit: Payer: Self-pay | Admitting: Medical

## 2021-08-30 ENCOUNTER — Encounter: Payer: Self-pay | Admitting: Medical

## 2021-10-10 DIAGNOSIS — R69 Illness, unspecified: Secondary | ICD-10-CM | POA: Diagnosis not present

## 2021-10-10 DIAGNOSIS — F332 Major depressive disorder, recurrent severe without psychotic features: Secondary | ICD-10-CM | POA: Diagnosis not present

## 2021-10-10 DIAGNOSIS — F900 Attention-deficit hyperactivity disorder, predominantly inattentive type: Secondary | ICD-10-CM | POA: Diagnosis not present

## 2021-10-10 DIAGNOSIS — F411 Generalized anxiety disorder: Secondary | ICD-10-CM | POA: Diagnosis not present

## 2021-10-15 ENCOUNTER — Other Ambulatory Visit: Payer: Self-pay | Admitting: Internal Medicine

## 2021-10-21 ENCOUNTER — Ambulatory Visit: Payer: Medicare HMO | Admitting: Medical

## 2021-10-22 ENCOUNTER — Other Ambulatory Visit: Payer: Self-pay | Admitting: Internal Medicine

## 2021-10-23 ENCOUNTER — Ambulatory Visit (INDEPENDENT_AMBULATORY_CARE_PROVIDER_SITE_OTHER): Payer: Medicare HMO | Admitting: Medical

## 2021-10-23 VITALS — BP 122/80 | HR 107 | Wt 221.8 lb

## 2021-10-23 DIAGNOSIS — N181 Chronic kidney disease, stage 1: Secondary | ICD-10-CM

## 2021-10-23 DIAGNOSIS — K219 Gastro-esophageal reflux disease without esophagitis: Secondary | ICD-10-CM | POA: Diagnosis not present

## 2021-10-23 DIAGNOSIS — Z23 Encounter for immunization: Secondary | ICD-10-CM | POA: Diagnosis not present

## 2021-10-23 DIAGNOSIS — Z7185 Encounter for immunization safety counseling: Secondary | ICD-10-CM | POA: Diagnosis not present

## 2021-10-23 DIAGNOSIS — R142 Eructation: Secondary | ICD-10-CM | POA: Diagnosis not present

## 2021-10-23 DIAGNOSIS — R110 Nausea: Secondary | ICD-10-CM | POA: Diagnosis not present

## 2021-10-23 DIAGNOSIS — E1165 Type 2 diabetes mellitus with hyperglycemia: Secondary | ICD-10-CM | POA: Diagnosis not present

## 2021-10-23 DIAGNOSIS — E559 Vitamin D deficiency, unspecified: Secondary | ICD-10-CM | POA: Diagnosis not present

## 2021-10-23 LAB — POCT GLYCOSYLATED HEMOGLOBIN (HGB A1C): Hemoglobin A1C: 6.1 % — AB (ref 4.0–5.6)

## 2021-10-23 MED ORDER — OZEMPIC (1 MG/DOSE) 4 MG/3ML ~~LOC~~ SOPN: PEN_INJECTOR | SUBCUTANEOUS | 1 refills | Status: DC

## 2021-10-23 MED ORDER — OMEPRAZOLE 40 MG PO CPDR
40.0000 mg | DELAYED_RELEASE_CAPSULE | Freq: Every day | ORAL | 0 refills | Status: DC
Start: 2021-10-23 — End: 2022-02-24

## 2021-10-23 MED ORDER — PROMETHAZINE HCL 12.5 MG PO TABS: 12.5000 mg | ORAL_TABLET | Freq: Three times a day (TID) | ORAL | 0 refills | Status: DC | PRN

## 2021-10-23 MED ORDER — ONDANSETRON HCL 4 MG PO TABS: 4.0000 mg | ORAL_TABLET | Freq: Three times a day (TID) | ORAL | 0 refills | Status: DC | PRN

## 2021-10-23 NOTE — Addendum Note (Signed)
Addended by: Minette Headland A on: 10/23/2021 05:08 PM   Modules accepted: Orders

## 2021-10-23 NOTE — Progress Notes (Signed)
Subjective:  [name] is a 61 y.o. female who presents for Chief Complaint  Patient presents with   med check    Med check, needs refill on omeprazole and needs hep b-        Patient Care Team: Adriell Polansky, [name], PA-C as PCP - General (Family Medicine) [name], MD as Consulting Physician (Nephrology) [name], [facility] as Pharmacist (Pharmacist) Sees dentist Dr. Lucio Edward, GI Tawni Millers, optometry Dr. Donnie Mesa, general surgery Dr. Lavonia Drafts, og/gyn Dr. Harrie Jeans, nephrology, [facility] Dr. [name], psychiatry    Here for med check  Taking ozempic weekly.  Sometimes gets off track with dosing.  Not checking glucose regularly  Has chronic problems with bad belching since teenage years.   Uses hot water or aloe liquid to help.  Has seen GI prior for this.  Its a significant daily problem. Worse if sitting.  Tries to avoid certain food.  Problem persists though  Has hx/o IBS.  In the past was more loose, but now more formed.   Still has several loose stools daily.   On hyoscyamine  Compliant with rest of medications as usual  Here to discuss vaccines.  No other new c/o.  No other aggravating or relieving factors.    No other c/o.  Past Medical History:  Diagnosis Date   Abdominal pain    due to fibroids   Anxiety    Arthritis    hips and knees   Asthma    stress induced asthma   Chest pain 06/2019   Chlamydia    Chronic kidney disease    ckd stage 1 per 5-26-201 dr Cecille Rubin [facility] ov note   Depression    DM type 2 (diabetes mellitus, type 2) ([place]) dx jan 2021   Fibroid    uterine   GERD (gastroesophageal reflux disease)    Hepatic steatosis 12/05/2019   Hyperlipidemia    IBS (irritable bowel syndrome)    Liver nodule 1999   no follow up needed was due to stress   Morbid obesity ([place])    Neuropathy    both fingers and toes at times   Obesity    Protein in urine 03/2019   Trichomonas    Tubular  adenoma of colon 01/2012   Current Outpatient Medications on File Prior to Visit  Medication Sig Dispense Refill   albuterol (VENTOLIN HFA) 108 (90 Base) MCG/ACT inhaler Inhale 2 puffs into the lungs every 6 (six) hours as needed for wheezing. 18 each 0   aspirin 81 MG tablet Take 81 mg by mouth daily.     Cholecalciferol (VITAMIN D) 50 MCG (2000 UT) CAPS Take 1 capsule (2,000 Units total) by mouth daily. 90 capsule 3   clonazePAM (KLONOPIN) 0.5 MG tablet Take 0.5 mg by mouth daily as needed.     Cyanocobalamin (B-12 PO) Take by mouth.     diclofenac Sodium (VOLTAREN) 1 % GEL APPLY 4 G TOPICALLY 4 (FOUR) TIMES DAILY. (Patient taking differently: Apply 4 g topically as needed.) 200 g 1   fluticasone (FLONASE) 50 MCG/ACT nasal spray SPRAY 2 SPRAYS INTO EACH NOSTRIL EVERY DAY 48 mL 0   gabapentin (NEURONTIN) 100 MG capsule Take 100 mg by mouth 3 (three) times daily.     hyoscyamine (LEVBID) 0.375 MG 12 hr tablet Take 1 tablet (0.375 mg total) by mouth every 12 (twelve) hours as needed. 20 tablet 1   losartan (COZAAR) 25 MG tablet Take 1 tablet (  25 mg total) by mouth daily. 90 tablet 3   Melatonin 5 MG CAPS Take 5 mg by mouth at bedtime as needed (sleep).      Multiple Vitamin (MULTIVITAMIN) tablet Take 1 tablet daily by mouth.     rosuvastatin (CRESTOR) 20 MG tablet Take 1 tablet (20 mg total) by mouth daily. 90 tablet 3   venlafaxine XR (EFFEXOR-XR) 150 MG 24 hr capsule Take 150 mg by mouth daily with breakfast.     zolpidem (AMBIEN) 10 MG tablet Take 10 mg by mouth at bedtime as needed for sleep.     Glucose Blood (BLOOD GLUCOSE TEST STRIPS) STRP Test once a day. Pt has a onetouch verio flex meter dx e11.9 100 strip 1   Lancets (ONETOUCH ULTRASOFT) lancets Test once a day. Dx E11.9 pt has onetouch verio flex meter 100 each 1   No current facility-administered medications on file prior to visit.     The following portions of the patient's history were reviewed and updated as appropriate:  allergies, current medications, past family history, past medical history, past social history, past surgical history and problem list.  ROS Otherwise as in subjective above    Objective: BP 122/80   Pulse (!) 107   Wt 221 lb 12.8 oz (100.6 kg)   LMP 06/23/2014   BMI 34.74 kg/m   Wt Readings from Last 3 Encounters:  10/23/21 221 lb 12.8 oz (100.6 kg)  07/17/21 222 lb 12.8 oz (101.1 kg)  07/12/21 222 lb (100.7 kg)    General appearance: alert, no distress, well developed, well nourished HEENT: normocephalic, sclerae anicteric, conjunctiva pink and moist, TMs pearly, nares patent, no discharge or erythema, pharynx normal Oral cavity: MMM, no lesions Neck: supple, no lymphadenopathy, no thyromegaly, no masses Heart: RRR, normal S1, S2, no murmurs Lungs: CTA bilaterally, no wheezes, rhonchi, or rales Abdomen: +bs, soft, non tender, non distended, no masses, no hepatomegaly, no splenomegaly Pulses: 2+ radial pulses, 2+ pedal pulses, normal cap refill Ext: no edema   Assessment: Encounter Diagnoses  Name Primary?   Belching Yes   Chronic nausea    Type 2 diabetes mellitus with hyperglycemia, without long-term current use of insulin (HCC)    CKD (chronic kidney disease) stage 1, GFR 90 ml/min or greater    Gastroesophageal reflux disease, unspecified whether esophagitis present    Vitamin D deficiency    Need for hepatitis B vaccination    Vaccine counseling      Plan: Diabetes - we discussed going back to metformin or trying a less potent GLP1 given the nausea and belching but she wants to continue ozempic for now.  Monitor sugars, work on healthy diet and exercise  Belching, swallow difficulty, chronic - referral back to GI.  May need EGD, swallow study.  Continue omeprazole.  Consider alternate GLP1 therapy for diabetes.   CKD - stable, continue losartan  GERD - no current daily medication  Vitamin D deficiency - compliant with supplement started 06/2021  Shingles  vaccine:  I recommend you have a shingles vaccine to help prevent shingles or herpes zoster outbreak.   Please call your insurer to inquire about coverage for the Shingrix vaccine given in 2 doses.   Some insurers cover this vaccine after age 32, some cover this after age 76.  If your insurer covers this, then call to schedule appointment to have this vaccine here.  I recommend Pneumococcal 23 vaccine. She will consider.  Counseled on the Hepatitis B virus vaccine.  Hepatitis  B [place] B vaccine given after consent obtained.     Omelia was seen today for med check.  Diagnoses and all orders for this visit:  Belching -     Ambulatory referral to Gastroenterology  Chronic nausea -     Ambulatory referral to Gastroenterology  Type 2 diabetes mellitus with hyperglycemia, without long-term current use of insulin (HCC)  CKD (chronic kidney disease) stage 1, GFR 90 ml/min or greater  Gastroesophageal reflux disease, unspecified whether esophagitis present -     Ambulatory referral to Gastroenterology  Vitamin D deficiency  Need for hepatitis B vaccination -     Heplisav-B (HepB-CPG) Vaccine  Vaccine counseling  Other orders -     omeprazole (PRILOSEC) 40 MG capsule; Take 1 capsule (40 mg total) by mouth daily. -     Semaglutide, 1 MG/DOSE, (OZEMPIC, 1 MG/DOSE,) 4 MG/3ML SOPN; INJECT 1 MG ONCE A WEEK AS DIRECTED -     ondansetron (ZOFRAN) 4 MG tablet; Take 1 tablet (4 mg total) by mouth every 8 (eight) hours as needed for nausea or vomiting. -     promethazine (PHENERGAN) 12.5 MG tablet; Take 1 tablet (12.5 mg total) by mouth every 8 (eight) hours as needed for nausea or vomiting.    Follow up: pending referral

## 2021-10-28 ENCOUNTER — Telehealth: Payer: Self-pay | Admitting: Medical

## 2021-10-28 NOTE — Telephone Encounter (Signed)
GI Referral

## 2021-11-27 ENCOUNTER — Encounter: Payer: Self-pay | Admitting: Internal Medicine

## 2021-12-17 ENCOUNTER — Other Ambulatory Visit: Payer: Self-pay | Admitting: Medical

## 2021-12-17 ENCOUNTER — Telehealth: Payer: Self-pay

## 2021-12-17 DIAGNOSIS — H8110 Benign paroxysmal vertigo, unspecified ear: Secondary | ICD-10-CM

## 2021-12-17 MED ORDER — MECLIZINE HCL 25 MG PO TABS: 25.0000 mg | ORAL_TABLET | Freq: Two times a day (BID) | ORAL | 0 refills | Status: DC | PRN

## 2021-12-17 NOTE — Telephone Encounter (Signed)
[facility] sent fax for refill on meclizine last apt 10/23/21 next apt 12/18/21

## 2021-12-18 ENCOUNTER — Ambulatory Visit (INDEPENDENT_AMBULATORY_CARE_PROVIDER_SITE_OTHER): Payer: Medicare Other | Admitting: Medical

## 2021-12-18 VITALS — BP 120/70 | HR 103 | Temp 98.2°F | Resp 18 | Wt 217.0 lb

## 2021-12-18 DIAGNOSIS — J4520 Mild intermittent asthma, uncomplicated: Secondary | ICD-10-CM

## 2021-12-18 DIAGNOSIS — Z889 Allergy status to unspecified drugs, medicaments and biological substances status: Secondary | ICD-10-CM

## 2021-12-18 DIAGNOSIS — U071 COVID-19: Secondary | ICD-10-CM

## 2021-12-18 DIAGNOSIS — R0602 Shortness of breath: Secondary | ICD-10-CM | POA: Diagnosis not present

## 2021-12-18 DIAGNOSIS — R058 Other specified cough: Secondary | ICD-10-CM | POA: Diagnosis not present

## 2021-12-18 DIAGNOSIS — J4541 Moderate persistent asthma with (acute) exacerbation: Secondary | ICD-10-CM | POA: Insufficient documentation

## 2021-12-18 MED ORDER — BUDESONIDE-FORMOTEROL FUMARATE 160-4.5 MCG/ACT IN AERO: 2.0000 | INHALATION_SPRAY | Freq: Two times a day (BID) | RESPIRATORY_TRACT | 2 refills | Status: DC

## 2021-12-18 MED ORDER — ALBUTEROL SULFATE HFA 108 (90 BASE) MCG/ACT IN AERS: 2.0000 | INHALATION_SPRAY | Freq: Four times a day (QID) | RESPIRATORY_TRACT | 2 refills | Status: DC | PRN

## 2021-12-18 MED ORDER — BENZONATATE 200 MG PO CAPS: 200.0000 mg | ORAL_CAPSULE | Freq: Three times a day (TID) | ORAL | 0 refills | Status: DC | PRN

## 2021-12-18 MED ORDER — MONTELUKAST SODIUM 10 MG PO TABS: 10.0000 mg | ORAL_TABLET | Freq: Every day | ORAL | 3 refills | Status: DC

## 2021-12-18 MED ORDER — AZITHROMYCIN 250 MG PO TABS: ORAL_TABLET | ORAL | 0 refills | Status: DC

## 2021-12-18 NOTE — Progress Notes (Signed)
Subjective:  [name] is a 61 y.o. female who presents for Chief Complaint  Patient presents with   Covid Positive    Covid positive on 12/07/21. Symptoms- SOB, some chest pain, coughing up yellow/green pelgm     Here for f/u.  Tested positive for covid 12/07/21.   Initially had some right sided pain on leg and body, then had sore throat, body aches.  Within in a few days had cough, congestion, ongoing body aches.  Felt hot but didn't take temp.    In the last few days, having SOB, fatigue, coughing.  Still coughing quite a bit.  Still coughing up yellow/green mucous.  No longer has body or chills, no longer has sore throat.  No nausea, no vomiting.   Had some loose stools initially but not much.  No loose stools now.   Had headache and still having headaches now.  Still having some congestion.    Using OTC sinus congestion medication.   She notes in remote past used Singulair.   She is curious about using this now.   Still has fatigue, low energy.  Has asthma, and albuterol is helping.  Had some left over Symbicort been using some too.  This is the 2nd time  having covid since being vaccinated.  No other aggravating or relieving factors.    No other c/o.  Past Medical History:  Diagnosis Date   Abdominal pain    due to fibroids   Anxiety    Arthritis    hips and knees   Asthma    stress induced asthma   Chest pain 06/2019   Chlamydia    Chronic kidney disease    ckd stage 1 per 5-26-201 dr Cecille Rubin [facility] ov note   Depression    DM type 2 (diabetes mellitus, type 2) ([place]) dx jan 2021   Fibroid    uterine   GERD (gastroesophageal reflux disease)    Hepatic steatosis 12/05/2019   Hyperlipidemia    IBS (irritable bowel syndrome)    Liver nodule 1999   no follow up needed was due to stress   Morbid obesity ([place])    Neuropathy    both fingers and toes at times   Obesity    Protein in urine 03/2019   Trichomonas    Tubular adenoma of colon 01/2012   Current  Outpatient Medications on File Prior to Visit  Medication Sig Dispense Refill   aspirin 81 MG tablet Take 81 mg by mouth daily.     Cholecalciferol (VITAMIN D) 50 MCG (2000 UT) CAPS Take 1 capsule (2,000 Units total) by mouth daily. 90 capsule 3   clonazePAM (KLONOPIN) 0.5 MG tablet Take 0.5 mg by mouth daily as needed.     Cyanocobalamin (B-12 PO) Take by mouth.     diclofenac Sodium (VOLTAREN) 1 % GEL APPLY 4 G TOPICALLY 4 (FOUR) TIMES DAILY. (Patient taking differently: Apply 4 g topically as needed.) 200 g 1   fluticasone (FLONASE) 50 MCG/ACT nasal spray SPRAY 2 SPRAYS INTO EACH NOSTRIL EVERY DAY 48 mL 0   gabapentin (NEURONTIN) 100 MG capsule Take 100 mg by mouth 3 (three) times daily.     Glucose Blood (BLOOD GLUCOSE TEST STRIPS) STRP Test once a day. Pt has a onetouch verio flex meter dx e11.9 100 strip 1   losartan (COZAAR) 25 MG tablet Take 1 tablet (25 mg total) by mouth daily. 90 tablet 3   Melatonin 5 MG CAPS Take 5 mg by mouth  at bedtime as needed (sleep).      Multiple Vitamin (MULTIVITAMIN) tablet Take 1 tablet daily by mouth.     omeprazole (PRILOSEC) 40 MG capsule Take 1 capsule (40 mg total) by mouth daily. 90 capsule 0   ondansetron (ZOFRAN) 4 MG tablet Take 1 tablet (4 mg total) by mouth every 8 (eight) hours as needed for nausea or vomiting. 30 tablet 0   promethazine (PHENERGAN) 12.5 MG tablet Take 1 tablet (12.5 mg total) by mouth every 8 (eight) hours as needed for nausea or vomiting. 30 tablet 0   rosuvastatin (CRESTOR) 20 MG tablet Take 1 tablet (20 mg total) by mouth daily. 90 tablet 3   Semaglutide, 1 MG/DOSE, (OZEMPIC, 1 MG/DOSE,) 4 MG/3ML SOPN INJECT 1 MG ONCE A WEEK AS DIRECTED 3 mL 1   venlafaxine XR (EFFEXOR-XR) 150 MG 24 hr capsule Take 150 mg by mouth daily with breakfast.     zolpidem (AMBIEN) 10 MG tablet Take 10 mg by mouth at bedtime as needed for sleep.     hyoscyamine (LEVBID) 0.375 MG 12 hr tablet Take 1 tablet (0.375 mg total) by mouth every 12 (twelve)  hours as needed. 20 tablet 1   Lancets (ONETOUCH ULTRASOFT) lancets Test once a day. Dx E11.9 pt has onetouch verio flex meter 100 each 1   No current facility-administered medications on file prior to visit.   The following portions of the patient's history were reviewed and updated as appropriate: allergies, current medications, past family history, past medical history, past social history, past surgical history and problem list.  ROS Otherwise as in subjective above   Objective: BP 120/70   Pulse (!) 103   Temp 98.2 F (36.8 C)   Resp 18   Wt 217 lb (98.4 kg)   LMP 06/23/2014   SpO2 96%   BMI 33.99 kg/m   General appearance: alert, no distress, well developed, well nourished HEENT: normocephalic, sclerae anicteric, conjunctiva pink and moist, TMs pearly, nares with some mucoid discharge and erythema, pharynx normal Oral cavity: MMM, no lesions Neck: supple, no lymphadenopathy, no thyromegaly, no masses Heart: RRR, normal S1, S2, no murmurs Lungs: somewhat decreased breath sounds, otherwise  no wheezes, rhonchi, or rales Pulses: 2+ radial pulses, 2+ pedal pulses, normal cap refill Ext: no edema   Assessment: Encounter Diagnoses  Name Primary?   Productive cough Yes   Moderate persistent asthma with acute exacerbation    COVID    History of allergy    SOB (shortness of breath)    Mild intermittent asthma without complication      Plan: We discussed her recent COVID infection and persistent symptoms.  Begin Z-Pak antibiotic, begin back on Symbicort which she has used in the past.  Continue albuterol as needed.  Can use Tessalon Perles as needed.  Rest, hydrate well.  If not much improved in the next 72 hours call or recheck.  She has year-round allergies so we will begin Singulair.  Given her history of asthma, we will add back Symbicort in general.  We discussed the proper use of Symbicort preventative inhaler versus albuterol rescue inhaler.  Ceniyah was seen  today for covid positive.  Diagnoses and all orders for this visit:  Productive cough  Moderate persistent asthma with acute exacerbation  COVID  History of allergy  SOB (shortness of breath)  Mild intermittent asthma without complication -     albuterol (VENTOLIN HFA) 108 (90 Base) MCG/ACT inhaler; Inhale 2 puffs into the lungs every 6 (  six) hours as needed for wheezing.  Other orders -     budesonide-formoterol (SYMBICORT) 160-4.5 MCG/ACT inhaler; Inhale 2 puffs into the lungs 2 (two) times daily. -     montelukast (SINGULAIR) 10 MG tablet; Take 1 tablet (10 mg total) by mouth at bedtime. -     azithromycin (ZITHROMAX) 250 MG tablet; 2 tablets day 1, then 1 tablet days 2-4 -     benzonatate (TESSALON) 200 MG capsule; Take 1 capsule (200 mg total) by mouth 3 (three) times daily as needed for cough.    Follow up: prn

## 2021-12-31 ENCOUNTER — Encounter: Payer: Self-pay | Admitting: Internal Medicine

## 2022-01-03 ENCOUNTER — Ambulatory Visit (INDEPENDENT_AMBULATORY_CARE_PROVIDER_SITE_OTHER): Payer: Medicare Other | Admitting: Medical

## 2022-01-03 ENCOUNTER — Encounter: Payer: Self-pay | Admitting: Medical

## 2022-01-03 VITALS — BP 128/80 | HR 100 | Temp 98.5°F | Wt 219.0 lb

## 2022-01-03 DIAGNOSIS — R0602 Shortness of breath: Secondary | ICD-10-CM | POA: Diagnosis not present

## 2022-01-03 DIAGNOSIS — E559 Vitamin D deficiency, unspecified: Secondary | ICD-10-CM

## 2022-01-03 DIAGNOSIS — R0600 Dyspnea, unspecified: Secondary | ICD-10-CM

## 2022-01-03 DIAGNOSIS — R911 Solitary pulmonary nodule: Secondary | ICD-10-CM

## 2022-01-03 DIAGNOSIS — N181 Chronic kidney disease, stage 1: Secondary | ICD-10-CM | POA: Diagnosis not present

## 2022-01-03 DIAGNOSIS — Z8616 Personal history of COVID-19: Secondary | ICD-10-CM | POA: Diagnosis not present

## 2022-01-03 DIAGNOSIS — E1165 Type 2 diabetes mellitus with hyperglycemia: Secondary | ICD-10-CM | POA: Diagnosis not present

## 2022-01-03 LAB — D-DIMER, QUANTITATIVE: D-DIMER: 0.32 mg/L FEU (ref 0.00–0.49)

## 2022-01-03 LAB — CBC
Hematocrit: 37.9 % (ref 34.0–46.6)
Hemoglobin: 12.5 g/dL (ref 11.1–15.9)
MCH: 25.1 pg — ABNORMAL LOW (ref 26.6–33.0)
MCHC: 33 g/dL (ref 31.5–35.7)
MCV: 76 fL — ABNORMAL LOW (ref 79–97)
Platelets: 219 10*3/uL (ref 150–450)
RBC: 4.99 x10E6/uL (ref 3.77–5.28)
RDW: 14.2 % (ref 11.7–15.4)
WBC: 6.8 10*3/uL (ref 3.4–10.8)

## 2022-01-03 LAB — SEDIMENTATION RATE: Sed Rate: 64 mm/hr — ABNORMAL HIGH (ref 0–40)

## 2022-01-03 MED ORDER — PREDNISONE 10 MG PO TABS: ORAL_TABLET | ORAL | 0 refills | Status: DC

## 2022-01-03 NOTE — Patient Instructions (Signed)
Recommendations: Continue albuterol rescue inhaler 2 puffs every 4-6 hours as needed for shortness of breath cough or wheezing Continue Symbicort prevention inhaler 2 puffs in the morning 2 puffs at night, rinse mouth after water few minutes after use Began to emerg[facility] immune plus vitamin pack over-the-counter Continue guaifenesin a few days if you would like Be good about your water intake and rest If the stat labs come back positive today for D-dimer I will need to set you up for chest CT stat Continue to inject her Ozempic subcutaneous under the skin as we discussed today Keep in mind COVID sometimes causes lingering inflammation and fatigue for weeks to months If any new changes over the weekend that are more severe and go to the emergency department If any obvious swelling discoloration or significant pain in your calf follow-up urgently

## 2022-01-03 NOTE — Progress Notes (Signed)
Subjective:  [name] is a 61 y.o. female who presents for Chief Complaint  Patient presents with   other    SOB and trouble breathing with the inhalers      Here for recheck.  Still having SOB.  Stilll coughing , no productive cough.  She recongized that it takes a while to get energy back after covid.  I saw her on 12/18/2021 for breathing issues status post recent infection with COVID.  On that visit we had a Z-Pak, Symbicort which she has taken.  She felt the most improvement with guaifenesin.  She is still using Symbicort and albuterol but still feels short of breath.  She is going on a cruise in 10 days and wants to be better.  She has a history of asthma with very infrequent flareup.  She does not recall ever having to use prednisone but once in her life.  She said the last time she had shortness of breath like this a breathing treatment really helped and she would like a breathing treatment today.  When she had COVID recently her symptoms included body pain, sore throat, cough, congestion, felt feverish, coughing.  She had productive cough and now is chest dry cough.  Still has fatigue, low energy.  This is the 2nd time  having covid since being vaccinated.  She has had some achiness in the right calf but no obvious discoloration and swelling or not.  No personal history of blood clot but 2 sisters have a history of clots and factor disorder.  No other aggravating or relieving factors.    No other c/o.  Past Medical History:  Diagnosis Date   Abdominal pain    due to fibroids   Anxiety    Arthritis    hips and knees   Asthma    stress induced asthma   Chest pain 06/2019   Chlamydia    Chronic kidney disease    ckd stage 1 per 5-26-201 dr Cecille Rubin [facility] ov note   Depression    DM type 2 (diabetes mellitus, type 2) ([place]) dx jan 2021   Fibroid    uterine   GERD (gastroesophageal reflux disease)    Hepatic steatosis 12/05/2019   Hyperlipidemia    IBS (irritable bowel  syndrome)    Liver nodule 1999   no follow up needed was due to stress   Morbid obesity ([place])    Neuropathy    both fingers and toes at times   Obesity    Protein in urine 03/2019   Trichomonas    Tubular adenoma of colon 01/2012   Current Outpatient Medications on File Prior to Visit  Medication Sig Dispense Refill   albuterol (VENTOLIN HFA) 108 (90 Base) MCG/ACT inhaler Inhale 2 puffs into the lungs every 6 (six) hours as needed for wheezing. 18 each 2   aspirin 81 MG tablet Take 81 mg by mouth daily.     benzonatate (TESSALON) 200 MG capsule Take 1 capsule (200 mg total) by mouth 3 (three) times daily as needed for cough. 30 capsule 0   budesonide-formoterol (SYMBICORT) 160-4.5 MCG/ACT inhaler Inhale 2 puffs into the lungs 2 (two) times daily. 1 each 2   Cholecalciferol (VITAMIN D) 50 MCG (2000 UT) CAPS Take 1 capsule (2,000 Units total) by mouth daily. 90 capsule 3   clonazePAM (KLONOPIN) 0.5 MG tablet Take 0.5 mg by mouth daily as needed.     Cyanocobalamin (B-12 PO) Take by mouth.     fluticasone (  FLONASE) 50 MCG/ACT nasal spray SPRAY 2 SPRAYS INTO EACH NOSTRIL EVERY DAY 48 mL 0   gabapentin (NEURONTIN) 100 MG capsule Take 100 mg by mouth 3 (three) times daily.     Glucose Blood (BLOOD GLUCOSE TEST STRIPS) STRP Test once a day. Pt has a onetouch verio flex meter dx e11.9 100 strip 1   hyoscyamine (LEVBID) 0.375 MG 12 hr tablet Take 1 tablet (0.375 mg total) by mouth every 12 (twelve) hours as needed. 20 tablet 1   Lancets (ONETOUCH ULTRASOFT) lancets Test once a day. Dx E11.9 pt has onetouch verio flex meter 100 each 1   losartan (COZAAR) 25 MG tablet Take 1 tablet (25 mg total) by mouth daily. 90 tablet 3   Melatonin 5 MG CAPS Take 5 mg by mouth at bedtime as needed (sleep).      montelukast (SINGULAIR) 10 MG tablet Take 1 tablet (10 mg total) by mouth at bedtime. 90 tablet 3   Multiple Vitamin (MULTIVITAMIN) tablet Take 1 tablet daily by mouth.     omeprazole (PRILOSEC) 40 MG  capsule Take 1 capsule (40 mg total) by mouth daily. 90 capsule 0   ondansetron (ZOFRAN) 4 MG tablet Take 1 tablet (4 mg total) by mouth every 8 (eight) hours as needed for nausea or vomiting. 30 tablet 0   promethazine (PHENERGAN) 12.5 MG tablet Take 1 tablet (12.5 mg total) by mouth every 8 (eight) hours as needed for nausea or vomiting. 30 tablet 0   rosuvastatin (CRESTOR) 20 MG tablet Take 1 tablet (20 mg total) by mouth daily. 90 tablet 3   Semaglutide, 1 MG/DOSE, (OZEMPIC, 1 MG/DOSE,) 4 MG/3ML SOPN INJECT 1 MG ONCE A WEEK AS DIRECTED 3 mL 1   venlafaxine XR (EFFEXOR-XR) 150 MG 24 hr capsule Take 150 mg by mouth daily with breakfast.     zolpidem (AMBIEN) 10 MG tablet Take 10 mg by mouth at bedtime as needed for sleep.     azithromycin (ZITHROMAX) 250 MG tablet 2 tablets day 1, then 1 tablet days 2-4 (Patient not taking: Reported on 01/03/2022) 6 tablet 0   diclofenac Sodium (VOLTAREN) 1 % GEL APPLY 4 G TOPICALLY 4 (FOUR) TIMES DAILY. (Patient not taking: Reported on 01/03/2022) 200 g 1   No current facility-administered medications on file prior to visit.   The following portions of the patient's history were reviewed and updated as appropriate: allergies, current medications, past family history, past medical history, past social history, past surgical history and problem list.  ROS Otherwise as in subjective above   Objective: BP 128/80   Pulse 100   Temp 98.5 F (36.9 C)   Wt 219 lb (99.3 kg)   LMP 06/23/2014   SpO2 98%   BMI 34.30 kg/m   Wt Readings from Last 3 Encounters:  01/03/22 219 lb (99.3 kg)  12/18/21 217 lb (98.4 kg)  10/23/21 221 lb 12.8 oz (100.6 kg)    General appearance: alert, no distress, well developed, well nourished HEENT: normocephalic, sclerae anicteric, conjunctiva pink and moist, TMs pearly, nares normal, pharynx normal  oral cavity: MMM, no lesions Neck: supple, no lymphadenopathy, no thyromegaly, no masses Heart: Heart rate right around 100,  RRR, normal S1, S2, no murmurs Lungs: Lungs clear, no wheezes, rhonchi, or rales Pulses: 2+ radial pulses, 2+ pedal pulses, normal cap refill Ext: no edema, no calf tenderness, no calf swelling or discoloration or asymmetry, negative cords, negative Homans     Assessment: Encounter Diagnoses  Name Primary?  SOB (shortness of breath) Yes   Type 2 diabetes mellitus with hyperglycemia, without long-term current use of insulin (HCC)    Pulmonary nodule    CKD (chronic kidney disease) stage 1, GFR 90 ml/min or greater    History of COVID-19    Vitamin D deficiency       Plan: We discussed her recent COVID infection and persistent symptoms.  I suspect some residual inflammation causing her symptoms.  We discussed other differential.  Labs as below today.  Breathing test PFT performed today.  We did use a round of nebulized albuterol and she did feel improvement by the time she left today.  After her last visit on 12/18/2021 she completed a round of Z-Pak antibiotic.  She did start the Symbicort which she is still using.  She is still using the albuterol.  We discussed the risk and benefits of prednisone which we will add today.  We will also check a vitamin D level since she has been taking her supplement for months.  Patient Instructions  Recommendations: Continue albuterol rescue inhaler 2 puffs every 4-6 hours as needed for shortness of breath cough or wheezing Continue Symbicort prevention inhaler 2 puffs in the morning 2 puffs at night, rinse mouth after water few minutes after use Began to [facility] immune plus vitamin pack over-the-counter Continue guaifenesin a few days if you would like Be good about your water intake and rest If the stat labs come back positive today for D-dimer I will need to set you up for chest CT stat Continue to inject her Ozempic subcutaneous under the skin as we discussed today Keep in mind COVID sometimes causes lingering inflammation and fatigue for weeks  to months If any new changes over the weekend that are more severe and go to the emergency department If any obvious swelling discoloration or significant pain in your calf follow-up urgently    Yalexa was seen today for other.  Diagnoses and all orders for this visit:  SOB (shortness of breath) -     Spirometry with graph -     CBC -     D-dimer, quantitative -     Sedimentation rate  Type 2 diabetes mellitus with hyperglycemia, without long-term current use of insulin (HCC)  Pulmonary nodule  CKD (chronic kidney disease) stage 1, GFR 90 ml/min or greater  History of COVID-19  Vitamin D deficiency -     VITAMIN D 25 Hydroxy (Vit-D Deficiency, Fractures)  Other orders -     predniSONE (DELTASONE) 10 MG tablet; 6 tablets all together day 1, 5 tablets day 2, 4 tablets day 3, 3 tablets day 4, 2 tablets day 5, 1 tablet day 6.   Follow up: pending labs

## 2022-01-04 LAB — VITAMIN D 25 HYDROXY (VIT D DEFICIENCY, FRACTURES): Vit D, 25-Hydroxy: 38.4 ng/mL (ref 30.0–100.0)

## 2022-01-10 DIAGNOSIS — H35033 Hypertensive retinopathy, bilateral: Secondary | ICD-10-CM | POA: Diagnosis not present

## 2022-01-10 DIAGNOSIS — H524 Presbyopia: Secondary | ICD-10-CM | POA: Diagnosis not present

## 2022-01-10 DIAGNOSIS — H2513 Age-related nuclear cataract, bilateral: Secondary | ICD-10-CM | POA: Diagnosis not present

## 2022-01-10 DIAGNOSIS — E119 Type 2 diabetes mellitus without complications: Secondary | ICD-10-CM | POA: Diagnosis not present

## 2022-01-10 DIAGNOSIS — H5203 Hypermetropia, bilateral: Secondary | ICD-10-CM | POA: Diagnosis not present

## 2022-01-10 LAB — HM DIABETES EYE EXAM

## 2022-01-23 ENCOUNTER — Other Ambulatory Visit: Payer: Self-pay | Admitting: Medical

## 2022-01-23 MED ORDER — MOUNJARO 7.5 MG/0.5ML ~~LOC~~ SOAJ: 7.5000 mg | SUBCUTANEOUS | 1 refills | Status: DC

## 2022-01-28 ENCOUNTER — Ambulatory Visit: Payer: Medicare Other | Admitting: Medical

## 2022-02-21 ENCOUNTER — Ambulatory Visit (INDEPENDENT_AMBULATORY_CARE_PROVIDER_SITE_OTHER): Payer: Medicare Other | Admitting: Medical

## 2022-02-21 ENCOUNTER — Other Ambulatory Visit: Payer: Self-pay | Admitting: Medical

## 2022-02-21 VITALS — Wt 217.0 lb

## 2022-02-21 DIAGNOSIS — Z113 Encounter for screening for infections with a predominantly sexual mode of transmission: Secondary | ICD-10-CM | POA: Diagnosis not present

## 2022-02-21 DIAGNOSIS — R102 Pelvic and perineal pain: Secondary | ICD-10-CM

## 2022-02-21 DIAGNOSIS — R350 Frequency of micturition: Secondary | ICD-10-CM | POA: Diagnosis not present

## 2022-02-21 LAB — POCT URINALYSIS DIP (PROADVANTAGE DEVICE)
Bilirubin, UA: NEGATIVE
Blood, UA: NEGATIVE
Glucose, UA: NEGATIVE mg/dL
Ketones, POC UA: NEGATIVE mg/dL
Nitrite, UA: NEGATIVE
Protein Ur, POC: 30 mg/dL — AB
Specific Gravity, Urine: 1.015
Urobilinogen, Ur: NEGATIVE
pH, UA: 6 (ref 5.0–8.0)

## 2022-02-21 MED ORDER — NITROFURANTOIN MONOHYD MACRO 100 MG PO CAPS: 100.0000 mg | ORAL_CAPSULE | Freq: Two times a day (BID) | ORAL | 0 refills | Status: DC

## 2022-02-21 NOTE — Progress Notes (Signed)
Subjective:  [name] is a 61 y.o. female who presents for Chief Complaint  Patient presents with   possible UTI    Possible UTI- some pelvic pain and goes up side and into back, had unprotected sex and pain started 3 weeks later      Here for pain in pelvic/groin area.  No vaginal discharge, no burning with urination, but pain in right groin to flank and around to back.  Started 1.5 week ago.  No blood in urine.  Has urinary frequency but no change.   No body aches or chills, no fever.  She does note unprotected sex 01/24/22, old acquaintance.   No other aggravating or relieving factors.    No other c/o.  Past Medical History:  Diagnosis Date   Abdominal pain    due to fibroids   Anxiety    Arthritis    hips and knees   Asthma    stress induced asthma   Chest pain 06/2019   Chlamydia    Chronic kidney disease    ckd stage 1 per 5-26-201 dr Cecille Rubin [facility] ov note   Depression    DM type 2 (diabetes mellitus, type 2) ([place]) dx jan 2021   Fibroid    uterine   GERD (gastroesophageal reflux disease)    Hepatic steatosis 12/05/2019   Hyperlipidemia    IBS (irritable bowel syndrome)    Liver nodule 1999   no follow up needed was due to stress   Morbid obesity ([place])    Neuropathy    both fingers and toes at times   Obesity    Protein in urine 03/2019   Trichomonas    Tubular adenoma of colon 01/2012   Current Outpatient Medications on File Prior to Visit  Medication Sig Dispense Refill   albuterol (VENTOLIN HFA) 108 (90 Base) MCG/ACT inhaler Inhale 2 puffs into the lungs every 6 (six) hours as needed for wheezing. 18 each 2   aspirin 81 MG tablet Take 81 mg by mouth daily.     budesonide-formoterol (SYMBICORT) 160-4.5 MCG/ACT inhaler Inhale 2 puffs into the lungs 2 (two) times daily. 1 each 2   Cholecalciferol (VITAMIN D) 50 MCG (2000 UT) CAPS Take 1 capsule (2,000 Units total) by mouth daily. 90 capsule 3   clonazePAM (KLONOPIN) 0.5 MG tablet Take 0.5 mg by mouth daily  as needed.     Cyanocobalamin (B-12 PO) Take by mouth.     diclofenac Sodium (VOLTAREN) 1 % GEL APPLY 4 G TOPICALLY 4 (FOUR) TIMES DAILY. 200 g 1   fluticasone (FLONASE) 50 MCG/ACT nasal spray SPRAY 2 SPRAYS INTO EACH NOSTRIL EVERY DAY 48 mL 0   gabapentin (NEURONTIN) 100 MG capsule Take 100 mg by mouth 3 (three) times daily.     Glucose Blood (BLOOD GLUCOSE TEST STRIPS) STRP Test once a day. Pt has a onetouch verio flex meter dx e11.9 100 strip 1   losartan (COZAAR) 25 MG tablet Take 1 tablet (25 mg total) by mouth daily. 90 tablet 3   Melatonin 5 MG CAPS Take 5 mg by mouth at bedtime as needed (sleep).      montelukast (SINGULAIR) 10 MG tablet Take 1 tablet (10 mg total) by mouth at bedtime. 90 tablet 3   Multiple Vitamin (MULTIVITAMIN) tablet Take 1 tablet daily by mouth.     omeprazole (PRILOSEC) 40 MG capsule Take 1 capsule (40 mg total) by mouth daily. 90 capsule 0   ondansetron (ZOFRAN) 4 MG tablet Take 1  tablet (4 mg total) by mouth every 8 (eight) hours as needed for nausea or vomiting. 30 tablet 0   rosuvastatin (CRESTOR) 20 MG tablet Take 1 tablet (20 mg total) by mouth daily. 90 tablet 3   Semaglutide, 1 MG/DOSE, (OZEMPIC, 1 MG/DOSE,) 4 MG/3ML SOPN INJECT 1 MG ONCE A WEEK AS DIRECTED 3 mL 1   venlafaxine XR (EFFEXOR-XR) 150 MG 24 hr capsule Take 150 mg by mouth daily with breakfast.     zolpidem (AMBIEN) 10 MG tablet Take 10 mg by mouth at bedtime as needed for sleep.     hyoscyamine (LEVBID) 0.375 MG 12 hr tablet Take 1 tablet (0.375 mg total) by mouth every 12 (twelve) hours as needed. 20 tablet 1   Lancets (ONETOUCH ULTRASOFT) lancets Test once a day. Dx E11.9 pt has onetouch verio flex meter 100 each 1   No current facility-administered medications on file prior to visit.   Past Surgical History:  Procedure Laterality Date   colonscopy  2017   COLPOSCOPY  1990   results were normal   CYSTOSCOPY  02/28/2020   Procedure: CYSTOSCOPY;  Surgeon: Lavonia Drafts, MD;   Location: [place];  Service: Gynecology;;   [place] N/A 08/23/2019   Procedure: DILATATION & CURETTAGE/DIAGNOSTIC HYSTEROSCOPY;  Surgeon: Lavonia Drafts, MD;  Location: [place];  Service: Gynecology;  Laterality: N/A;   IR GENERIC HISTORICAL  03/21/2016   IR EMBO TUMOR ORGAN ISCHEMIA INFARCT INC GUIDE ROADMAPPING 03/21/2016 Arne Cleveland, MD [facility]   IR GENERIC HISTORICAL  03/21/2016   IR US GUIDE VASC ACCESS RIGHT 03/21/2016 Arne Cleveland, MD [facility]   IR GENERIC HISTORICAL  03/21/2016   IR ANGIOGRAM SELECTIVE EACH ADDITIONAL VESSEL 03/21/2016 Arne Cleveland, MD [facility]   IR GENERIC HISTORICAL  03/21/2016   IR ANGIOGRAM SELECTIVE EACH ADDITIONAL VESSEL 03/21/2016 Arne Cleveland, MD [facility]   IR GENERIC HISTORICAL  03/21/2016   IR ANGIOGRAM PELVIS SELECTIVE OR SUPRASELECTIVE 03/21/2016 Arne Cleveland, MD [facility]   IR GENERIC HISTORICAL  03/21/2016   IR ANGIOGRAM PELVIS SELECTIVE OR SUPRASELECTIVE 03/21/2016 Arne Cleveland, MD [facility]   IR GENERIC HISTORICAL  02/27/2016   IR RADIOLOGIST EVAL & MGMT 02/27/2016 Arne Cleveland, MD [facility]   IR GENERIC HISTORICAL  04/03/2016   IR RADIOLOGIST EVAL & MGMT 04/03/2016 Arne Cleveland, MD [facility]   IR RADIOLOGIST EVAL & MGMT  08/26/2016   labial cyst removal     ROBOTIC ASSISTED TOTAL HYSTERECTOMY Bilateral 02/28/2020   Procedure: XI ROBOTIC ASSISTED TOTAL HYSTERECTOMY AND BILATERAL SALPINGECTOMY;  Surgeon: Lavonia Drafts, MD;  Location: [place];  Service: Gynecology;  Laterality: Bilateral;   TUBAL LIGATION     20 years ago    The following portions of the patient's history were reviewed and updated as appropriate: allergies, current medications, past family history, past medical history, past social history, past surgical history and problem list.  ROS Otherwise as in  subjective above    Objective: Wt 217 lb (98.4 kg)   LMP 06/23/2014   BMI 33.99 kg/m   General appearance: alert, no distress, well developed, well nourished Back: nontender, no CVA tenderness Abdomen: +bs, soft, non tender, non distended, no masses, no hepatomegaly, no splenomegaly Pulses: 2+ radial pulses, 2+ pedal pulses, normal cap refill Ext: no edema GU- declined   Assessment: Encounter Diagnoses  Name Primary?   Pelvic pain Yes   Urinary frequency    Screen for  STD (sexually transmitted disease)      Plan: We discussed symptoms and concerns.  Urinalysis today not particular remarkable.  Good about water intake.  Await culture and labs as below.  If worse over the weekend with urinary tract symptoms as discussed , then start the antibiotic.  A[place] was seen today for possible uti.  Diagnoses and all orders for this visit:  Pelvic pain -     POCT Urinalysis DIP (Proadvantage Device) -     Urine Culture -     RPR+HIV+GC+CT Panel -     Hepatitis C antibody -     Hepatitis B surface antigen  Urinary frequency -     POCT Urinalysis DIP (Proadvantage Device) -     Urine Culture -     RPR+HIV+GC+CT Panel -     Hepatitis C antibody -     Hepatitis B surface antigen  Screen for STD (sexually transmitted disease) -     POCT Urinalysis DIP (Proadvantage Device) -     Urine Culture -     RPR+HIV+GC+CT Panel -     Hepatitis C antibody -     Hepatitis B surface antigen  Other orders -     nitrofurantoin, macrocrystal-monohydrate, (MACROBID) 100 MG capsule; Take 1 capsule (100 mg total) by mouth 2 (two) times daily.    Follow up: pending labs

## 2022-02-23 LAB — URINE CULTURE

## 2022-02-24 LAB — RPR+HIV+GC+CT PANEL
Chlamydia trachomatis, NAA: NEGATIVE
HIV Screen 4th Generation wRfx: NONREACTIVE
Neisseria Gonorrhoeae by PCR: NEGATIVE
RPR Ser Ql: NONREACTIVE

## 2022-02-24 LAB — HEPATITIS B SURFACE ANTIGEN: Hepatitis B Surface Ag: NEGATIVE

## 2022-02-24 LAB — HEPATITIS C ANTIBODY: Hep C Virus Ab: NONREACTIVE

## 2022-02-25 ENCOUNTER — Encounter: Payer: Self-pay | Admitting: *Deleted

## 2022-02-27 DIAGNOSIS — K582 Mixed irritable bowel syndrome: Secondary | ICD-10-CM

## 2022-02-28 MED ORDER — HYOSCYAMINE SULFATE ER 0.375 MG PO TB12: 0.3750 mg | ORAL_TABLET | Freq: Two times a day (BID) | ORAL | 1 refills | Status: DC | PRN

## 2022-03-07 ENCOUNTER — Other Ambulatory Visit: Payer: Self-pay | Admitting: Medical

## 2022-03-07 DIAGNOSIS — K582 Mixed irritable bowel syndrome: Secondary | ICD-10-CM

## 2022-03-13 ENCOUNTER — Other Ambulatory Visit: Payer: Self-pay | Admitting: Medical

## 2022-03-21 ENCOUNTER — Other Ambulatory Visit: Payer: Self-pay | Admitting: Medical

## 2022-03-27 ENCOUNTER — Other Ambulatory Visit: Payer: Self-pay | Admitting: Medical

## 2022-03-27 DIAGNOSIS — H8110 Benign paroxysmal vertigo, unspecified ear: Secondary | ICD-10-CM

## 2022-03-31 ENCOUNTER — Encounter: Payer: Self-pay | Admitting: Physician Assistant

## 2022-03-31 ENCOUNTER — Ambulatory Visit (INDEPENDENT_AMBULATORY_CARE_PROVIDER_SITE_OTHER): Payer: 59 | Admitting: Physician Assistant

## 2022-03-31 VITALS — BP 110/74 | HR 103 | Ht 67.0 in | Wt 212.0 lb

## 2022-03-31 DIAGNOSIS — K219 Gastro-esophageal reflux disease without esophagitis: Secondary | ICD-10-CM

## 2022-03-31 DIAGNOSIS — R1013 Epigastric pain: Secondary | ICD-10-CM | POA: Diagnosis not present

## 2022-03-31 MED ORDER — OMEPRAZOLE 40 MG PO CPDR: 40.0000 mg | DELAYED_RELEASE_CAPSULE | Freq: Two times a day (BID) | ORAL | 3 refills | Status: DC

## 2022-03-31 NOTE — Progress Notes (Signed)
Chief Complaint: Abdominal pain  HPI:    [name] is a 62 year old female with a past medical history as listed below including abdominal pain, depression, GERD, IBS, morbid obesity and multiple others, known to Dr. Fuller Plan, who was referred to me by [name], PA-C for a complaint of abdominal pain.      06/26/2015 EGD with LA grade a reflux esophagitis, nonbleeding erosive antral gastropathy and otherwise normal.  Biopsy showed reactive gastritis.    08/10/2017 colonoscopy for personal history of adenomatous polyps with one 6 mm polyp in the transverse colon, mild sigmoid diverticulosis and internal hemorrhoids.  Repeat recommended in 5 years (08/11/2022)    01/10/2020 patient seen in clinic for GERD.  At that time it started with nausea after starting Ozempic.  Some epigastric pain.  At that time recommend she hold her Ozempic and see if it alleviated her symptoms.  Prescribed Omeprazole 20 mg twice daily and refill of Zofran and Hyoscyamine.  Also ordered gastric emptying study.    02/02/2020 gastric emptying study normal.    Today, the patient presents to clinic and tells me that she is worried because she has had an increase in epigastric burning pain that came on in August and has really continued since then.  Her PCP was worried that she may have an ulcer.  Patient tells me she is cut back on caffeine and that this seemed to help but regardless of using her Omeprazole 40 mg daily she continues with burning pain and occasionally feels this worse after eating.      Also aware that she has a large amount of stress in her life with her family and her job and is about to take on a few other things and just wants to make sure everything is okay.    Also aware she needs a colonoscopy this year.    Denies fever, chills, weight loss or blood in her stool.  Past Medical History:  Diagnosis Date   Abdominal pain    due to fibroids   Anxiety    Arthritis    hips and knees   Asthma     stress induced asthma   Chest pain 06/2019   Chlamydia    Chronic kidney disease    ckd stage 1 per 5-26-201 dr Cecille Rubin [facility] ov note   Depression    DM type 2 (diabetes mellitus, type 2) ([place]) dx jan 2021   Fibroid    uterine   GERD (gastroesophageal reflux disease)    Hepatic steatosis 12/05/2019   Hyperlipidemia    IBS (irritable bowel syndrome)    Liver nodule 1999   no follow up needed was due to stress   Morbid obesity ([place])    Neuropathy    both fingers and toes at times   Obesity    Protein in urine 03/2019   Trichomonas    Tubular adenoma of colon 01/2012    Past Surgical History:  Procedure Laterality Date   colonscopy  2017   COLPOSCOPY  1990   results were normal   CYSTOSCOPY  02/28/2020   Procedure: CYSTOSCOPY;  Surgeon: Lavonia Drafts, MD;  Location: [place];  Service: Gynecology;;   DILITATION & CURRETTAGE/HYSTROSCOPY WITH NOVASURE ABLATION N/A 08/23/2019   Procedure: DILATATION & CURETTAGE/DIAGNOSTIC HYSTEROSCOPY;  Surgeon: Lavonia Drafts, MD;  Location: [place];  Service: Gynecology;  Laterality: N/A;   IR GENERIC HISTORICAL  03/21/2016   IR EMBO TUMOR ORGAN ISCHEMIA INFARCT INC GUIDE  ROADMAPPING 03/21/2016 Arne Cleveland, MD [facility]   IR GENERIC HISTORICAL  03/21/2016   IR US GUIDE VASC ACCESS RIGHT 03/21/2016 Arne Cleveland, MD [facility]   IR GENERIC HISTORICAL  03/21/2016   IR [facility] SELECTIVE EACH ADDITIONAL VESSEL 03/21/2016 Arne Cleveland, MD [facility]   IR GENERIC HISTORICAL  03/21/2016   IR [facility] SELECTIVE EACH ADDITIONAL VESSEL 03/21/2016 Arne Cleveland, MD [facility]   IR GENERIC HISTORICAL  03/21/2016   IR ANGIOGRAM PELVIS SELECTIVE OR SUPRASELECTIVE 03/21/2016 Arne Cleveland, MD [facility]   IR GENERIC HISTORICAL  03/21/2016   IR ANGIOGRAM PELVIS SELECTIVE OR SUPRASELECTIVE 03/21/2016 Arne Cleveland, MD [facility]   IR GENERIC HISTORICAL  02/27/2016   IR  RADIOLOGIST EVAL & MGMT 02/27/2016 Arne Cleveland, MD [facility]   IR GENERIC HISTORICAL  04/03/2016   IR RADIOLOGIST EVAL & MGMT 04/03/2016 Arne Cleveland, MD [facility]   IR RADIOLOGIST EVAL & MGMT  08/26/2016   labial cyst removal     ROBOTIC ASSISTED TOTAL HYSTERECTOMY Bilateral 02/28/2020   Procedure: XI ROBOTIC ASSISTED TOTAL HYSTERECTOMY AND BILATERAL SALPINGECTOMY;  Surgeon: Lavonia Drafts, MD;  Location: [place];  Service: Gynecology;  Laterality: Bilateral;   TUBAL LIGATION     20 years ago    Current Outpatient Medications  Medication Sig Dispense Refill   albuterol (VENTOLIN HFA) 108 (90 Base) MCG/ACT inhaler Inhale 2 puffs into the lungs every 6 (six) hours as needed for wheezing. 18 each 2   aspirin 81 MG tablet Take 81 mg by mouth daily.     budesonide-formoterol (SYMBICORT) 160-4.5 MCG/ACT inhaler Inhale 2 puffs into the lungs 2 (two) times daily. 1 each 2   Cholecalciferol (VITAMIN D) 50 MCG (2000 UT) CAPS Take 1 capsule (2,000 Units total) by mouth daily. 90 capsule 3   clonazePAM (KLONOPIN) 0.5 MG tablet Take 0.5 mg by mouth daily as needed.     Cyanocobalamin (B-12 PO) Take by mouth.     diclofenac Sodium (VOLTAREN) 1 % GEL APPLY 4 G TOPICALLY 4 (FOUR) TIMES DAILY. 200 g 1   fluticasone (FLONASE) 50 MCG/ACT nasal spray SPRAY 2 SPRAYS INTO EACH NOSTRIL EVERY DAY 48 mL 0   gabapentin (NEURONTIN) 100 MG capsule Take 100 mg by mouth 3 (three) times daily.     Glucose Blood (BLOOD GLUCOSE TEST STRIPS) STRP Test once a day. Pt has a onetouch verio flex meter dx e11.9 100 strip 1   hyoscyamine (LEVBID) 0.375 MG 12 hr tablet Take 1 tablet (0.375 mg total) by mouth every 12 (twelve) hours as needed. 30 tablet 1   Lancets (ONETOUCH ULTRASOFT) lancets Test once a day. Dx E11.9 pt has onetouch verio flex meter 100 each 1   losartan (COZAAR) 25 MG tablet Take 1 tablet (25 mg total) by mouth daily. 90 tablet 3   Melatonin 5 MG CAPS Take 5 mg by  mouth at bedtime as needed (sleep).      montelukast (SINGULAIR) 10 MG tablet Take 1 tablet (10 mg total) by mouth at bedtime. 90 tablet 3   Multiple Vitamin (MULTIVITAMIN) tablet Take 1 tablet daily by mouth.     nitrofurantoin, macrocrystal-monohydrate, (MACROBID) 100 MG capsule Take 1 capsule (100 mg total) by mouth 2 (two) times daily. 14 capsule 0   omeprazole (PRILOSEC) 40 MG capsule TAKE 1 CAPSULE (40 MG TOTAL) BY MOUTH DAILY. 90 capsule 1   ondansetron (ZOFRAN) 4 MG tablet Take 1 tablet (4 mg total) by mouth every 8 (eight)  hours as needed for nausea or vomiting. 30 tablet 0   rosuvastatin (CRESTOR) 20 MG tablet Take 1 tablet (20 mg total) by mouth daily. 90 tablet 3   Semaglutide, 1 MG/DOSE, (OZEMPIC, 1 MG/DOSE,) 4 MG/3ML SOPN INJECT 1 MG ONCE A WEEK AS DIRECTED 3 mL 1   venlafaxine XR (EFFEXOR-XR) 150 MG 24 hr capsule Take 150 mg by mouth daily with breakfast.     zolpidem (AMBIEN) 10 MG tablet Take 10 mg by mouth at bedtime as needed for sleep.     No current facility-administered medications for this visit.    Allergies as of 03/31/2022 - Review Complete 03/31/2022  Allergen Reaction Noted   Percocet [oxycodone-acetaminophen] Itching 03/12/2020   Lamictal [lamotrigine] Rash 01/28/2017    Family History  Problem Relation Age of Onset   Diabetes Father    Diabetes Mother    Hypertension Mother    Deep vein thrombosis Mother    Cancer - Other Mother        peritoneal cancer   Hiatal hernia Mother    Colon cancer Maternal Grandmother    Colon polyps Maternal Grandmother    Hypertension Sister    Deep vein thrombosis Sister    Diabetes Sister    Hiatal hernia Sister    Rectal cancer Neg Hx    Stomach cancer Neg Hx    Pancreatic cancer Neg Hx    Kidney disease Neg Hx    Liver disease Neg Hx     Social History   Socioeconomic History   Marital status: Divorced    Spouse name: Not on file   Number of children: 2   Years of education: Not on file   Highest  education level: Not on file  Occupational History   Not on file  Tobacco Use   Smoking status: Never   Smokeless tobacco: Never  Vaping Use   Vaping Use: Never used  Substance and Sexual Activity   Alcohol use: Not Currently    Comment: occ, not weekly   Drug use: No   Sexual activity: Yes  Other Topics Concern   Not on file  Social History Narrative   Not on file   Social Determinants of Health   Financial Resource Strain: Low Risk  (07/12/2021)   Overall Financial Resource Strain (CARDIA)    Difficulty of Paying Living Expenses: Not hard at all  Food Insecurity: No Food Insecurity (07/12/2021)   Hunger Vital Sign    Worried About Running Out of Food in the Last Year: Never true    Ran Out of Food in the Last Year: Never true  Transportation Needs: No Transportation Needs (07/12/2021)   PRAPARE - Hydrologist (Medical): No    Lack of Transportation (Non-Medical): No  Physical Activity: Insufficiently Active (07/12/2021)   Exercise Vital Sign    Days of Exercise per Week: 3 days    Minutes of Exercise per Session: 10 min  Stress: Stress Concern Present (07/12/2021)   [place]    Feeling of Stress : To some extent  Social Connections: Not on file  Intimate Partner Violence: Not on file    Review of Systems:    Constitutional: No weight loss, fever or chills Cardiovascular: No chest pain Respiratory: No SOB  Gastrointestinal: See HPI and otherwise negative   Physical Exam:  Vital signs: BP 110/74   Pulse (!) 103   Ht '5\' 7"'$  (1.702 m)  Wt 212 lb (96.2 kg)   LMP 06/23/2014   BMI 33.20 kg/m    Constitutional:   Very Pleasant overweight AA female appears to be in NAD, Well developed, Well nourished, alert and cooperative Respiratory: Respirations even and unlabored. Lungs clear to auscultation bilaterally.   No wheezes, crackles, or rhonchi.  Cardiovascular: Normal S1, S2.  No MRG. Regular rate and rhythm. No peripheral edema, cyanosis or pallor.  Gastrointestinal:  Soft, nondistended, nontender. No rebound or guarding. Normal bowel sounds. No appreciable masses or hepatomegaly. Psychiatric:  Demonstrates good judgement and reason without abnormal affect or behaviors.  RELEVANT LABS AND IMAGING: CBC    Component Value Date/Time   WBC 6.8 01/03/2022 1118   WBC 5.7 02/15/2021 1513   RBC 4.99 01/03/2022 1118   RBC 5.10 02/15/2021 1513   HGB 12.5 01/03/2022 1118   HCT 37.9 01/03/2022 1118   PLT 219 01/03/2022 1118   MCV 76 (L) 01/03/2022 1118   MCH 25.1 (L) 01/03/2022 1118   MCH 25.1 (L) 02/15/2021 1513   MCHC 33.0 01/03/2022 1118   MCHC 32.4 02/15/2021 1513   RDW 14.2 01/03/2022 1118   LYMPHSABS 2.9 12/12/2020 0935   MONOABS 0.6 09/06/2016 0418   EOSABS 0.1 12/12/2020 0935   BASOSABS 0.0 12/12/2020 0935    CMP     Component Value Date/Time   NA 140 07/17/2021 0907   K 4.2 07/17/2021 0907   CL 102 07/17/2021 0907   CO2 22 07/17/2021 0907   GLUCOSE 91 07/17/2021 0907   GLUCOSE 112 (H) 02/15/2021 1513   BUN 9 07/17/2021 0907   CREATININE 0.86 07/17/2021 0907   CREATININE 0.85 01/28/2017 1501   CALCIUM 9.4 07/17/2021 0907   PROT 7.2 07/17/2021 0907   ALBUMIN 4.4 07/17/2021 0907   AST 18 07/17/2021 0907   ALT 11 07/17/2021 0907   ALKPHOS 83 07/17/2021 0907   BILITOT 0.2 07/17/2021 0907   GFRNONAA >60 02/15/2021 1513   GFRAA 98 11/24/2019 0918    Assessment: 1.  Epigastric pain: History of reflux and erosive gastropathy back in 2017 on EGD, now with increasing burning epigastric pain regardless of Omeprazole 40 mg daily; consider erosive gastropathy +/- PUD versus other 2.  GERD: With above  Plan: 1.  Scheduled patient for diagnostic EGD in the [place] with Dr. Fuller Plan.  Did provide the patient a detailed list of risks for the procedure and she agrees to proceed. 2.  Increased Omeprazole to 40 mg twice daily, 30 to 60 minutes before breakfast  and dinner.  #60 with 3 refills. 3.  Reviewed antireflux diet and lifestyle modifications. 4.  Patient will need colonoscopy in May of this year.  She verbalized understanding. 5.  Patient to follow in clinic per recommendations after procedure above or for her colonoscopy.  Ellouise Newer, PA-C [facility] 03/31/2022, 10:11 AM  Cc: [name], PA-C

## 2022-03-31 NOTE — Patient Instructions (Signed)
Increase your Omeprazole to twice daily.   We have sent the following medications to your pharmacy for you to pick up at your convenience: Omeprazole   You have been scheduled for an endoscopy. Please follow written instructions given to you at your visit today. If you use inhalers (even only as needed), please bring them with you on the day of your procedure.  Due to recent changes in healthcare laws, you may see the results of your imaging and laboratory studies on MyChart before your provider has had a chance to review them.  We understand that in some cases there may be results that are confusing or concerning to you. Not all laboratory results come back in the same time frame and the provider may be waiting for multiple results in order to interpret others.  Please give [name] 48 hours in order for your provider to thoroughly review all the results before contacting the office for clarification of your results.   Thank you for choosing me and [facility].  Ellouise Newer PA-C

## 2022-04-03 ENCOUNTER — Ambulatory Visit (AMBULATORY_SURGERY_CENTER): Payer: 59 | Admitting: Gastroenterology

## 2022-04-03 ENCOUNTER — Encounter: Payer: Self-pay | Admitting: Gastroenterology

## 2022-04-03 VITALS — BP 130/85 | HR 84 | Temp 97.1°F | Resp 17 | Ht 67.0 in | Wt 212.0 lb

## 2022-04-03 DIAGNOSIS — I10 Essential (primary) hypertension: Secondary | ICD-10-CM | POA: Diagnosis not present

## 2022-04-03 DIAGNOSIS — E119 Type 2 diabetes mellitus without complications: Secondary | ICD-10-CM | POA: Diagnosis not present

## 2022-04-03 DIAGNOSIS — E785 Hyperlipidemia, unspecified: Secondary | ICD-10-CM | POA: Diagnosis not present

## 2022-04-03 DIAGNOSIS — K219 Gastro-esophageal reflux disease without esophagitis: Secondary | ICD-10-CM

## 2022-04-03 DIAGNOSIS — J45909 Unspecified asthma, uncomplicated: Secondary | ICD-10-CM | POA: Diagnosis not present

## 2022-04-03 DIAGNOSIS — Q438 Other specified congenital malformations of intestine: Secondary | ICD-10-CM | POA: Diagnosis not present

## 2022-04-03 DIAGNOSIS — M549 Dorsalgia, unspecified: Secondary | ICD-10-CM | POA: Diagnosis not present

## 2022-04-03 DIAGNOSIS — Z86010 Personal history of colonic polyps: Secondary | ICD-10-CM | POA: Diagnosis not present

## 2022-04-03 DIAGNOSIS — Z800 Family history of malignant neoplasm of digestive organs: Secondary | ICD-10-CM | POA: Diagnosis not present

## 2022-04-03 DIAGNOSIS — R1013 Epigastric pain: Secondary | ICD-10-CM | POA: Diagnosis not present

## 2022-04-03 MED ORDER — SODIUM CHLORIDE 0.9 % IV SOLN: 500.0000 mL | Freq: Once | INTRAVENOUS | Status: DC

## 2022-04-03 NOTE — Progress Notes (Signed)
VS completed by MB.   Pt's states no medical or surgical changes since previsit or office visit.

## 2022-04-03 NOTE — Progress Notes (Signed)
Sedate, gd SR, tolerated procedure well, VSS, report to RN 

## 2022-04-03 NOTE — Patient Instructions (Signed)
Please read handouts provided. Continue present medications. Follow antireflux measures. FDgard 1-2 tablets three times a day as needed if symptoms not controlled on omeprazole 40 mg twice daily.   YOU HAD AN ENDOSCOPIC PROCEDURE TODAY AT [facility]:   Refer to the procedure report that was given to you for any specific questions about what was found during the examination.  If the procedure report does not answer your questions, please call your gastroenterologist to clarify.  If you requested that your care partner not be given the details of your procedure findings, then the procedure report has been included in a sealed envelope for you to review at your convenience later.  YOU SHOULD EXPECT: Some feelings of bloating in the abdomen. Passage of more gas than usual.  Walking can help get rid of the air that was put into your GI tract during the procedure and reduce the bloating. If you had a lower endoscopy (such as a colonoscopy or flexible sigmoidoscopy) you may notice spotting of blood in your stool or on the toilet paper. If you underwent a bowel prep for your procedure, you may not have a normal bowel movement for a few days.  Please Note:  You might notice some irritation and congestion in your nose or some drainage.  This is from the oxygen used during your procedure.  There is no need for concern and it should clear up in a day or so.  SYMPTOMS TO REPORT IMMEDIATELY:  Following upper endoscopy (EGD)  Vomiting of blood or coffee ground material  New chest pain or pain under the shoulder blades  Painful or persistently difficult swallowing  New shortness of breath  Fever of 100F or higher  Black, tarry-looking stools  For urgent or emergent issues, a gastroenterologist can be reached at any hour by calling [phone]. Do not use MyChart messaging for urgent concerns.    DIET:  We do recommend a small meal at first, but then you may proceed to your regular  diet.  Drink plenty of fluids but you should avoid alcoholic beverages for 24 hours.  ACTIVITY:  You should plan to take it easy for the rest of today and you should NOT DRIVE or use heavy machinery until tomorrow (because of the sedation medicines used during the test).    FOLLOW UP: Our staff will call the number listed on your records the next business day following your procedure.  We will call around 7:15- 8:00 am to check on you and address any questions or concerns that you may have regarding the information given to you following your procedure. If we do not reach you, we will leave a message.     If any biopsies were taken you will be contacted by phone or by letter within the next 1-3 weeks.  Please call us at [phone] if you have not heard about the biopsies in 3 weeks.    SIGNATURES/CONFIDENTIALITY: You and/or your care partner have signed paperwork which will be entered into your electronic medical record.  These signatures attest to the fact that that the information above on your After Visit Summary has been reviewed and is understood.  Full responsibility of the confidentiality of this discharge information lies with you and/or your care-partner.

## 2022-04-03 NOTE — Op Note (Signed)
[place] Patient Name: Arly Salminen Procedure Date: 04/03/2022 9:45 AM MRN: [id number] Endoscopist: Ladene Artist , MD, [id number] Age: 62 Referring MD:  Date of Birth: [birth year] Gender: Female Account #: [id number] Procedure:                Upper GI endoscopy Indications:              Epigastric abdominal pain, Gastroesophageal reflux                            disease Medicines:                Monitored Anesthesia Care Procedure:                Pre-Anesthesia Assessment:                           - Prior to the procedure, a History and Physical                            was performed, and patient medications and                            allergies were reviewed. The patient's tolerance of                            previous anesthesia was also reviewed. The risks                            and benefits of the procedure and the sedation                            options and risks were discussed with the patient.                            All questions were answered, and informed consent                            was obtained. Prior Anticoagulants: The patient has                            taken no anticoagulant or antiplatelet agents. ASA                            Grade Assessment: II - A patient with mild systemic                            disease. After reviewing the risks and benefits,                            the patient was deemed in satisfactory condition to                            undergo the procedure.  After obtaining informed consent, the endoscope was                            passed under direct vision. Throughout the                            procedure, the patient's blood pressure, pulse, and                            oxygen saturations were monitored continuously. The                            GIF [id number] #[id number] was introduced through the                            mouth, and advanced to the second part of  duodenum.                            The upper GI endoscopy was accomplished without                            difficulty. The patient tolerated the procedure                            well. Scope In: Scope Out: Findings:                 The examined esophagus was normal.                           The entire examined stomach was normal.                           The duodenal bulb and second portion of the                            duodenum were normal. Complications:            No immediate complications. Estimated Blood Loss:     Estimated blood loss: none. Impression:               - Normal esophagus.                           - Normal stomach.                           - Normal duodenal bulb and second portion of the                            duodenum.                           - No specimens collected. Recommendation:           - Patient has a contact number available for  emergencies. The signs and symptoms of potential                            delayed complications were discussed with the                            patient. Return to normal activities tomorrow.                            Written discharge instructions were provided to the                            patient.                           - Resume previous diet.                           - Follow antireflux measures.                           - Continue present medications.                           - FDgard 1-2 po tid prn if symptoms not controlled                            on omeprazole 40 mg po bid. Ladene Artist, MD 04/03/2022 10:09:13 AM This report has been signed electronically.

## 2022-04-03 NOTE — Progress Notes (Signed)
See 03/31/2022 H&P, no changes

## 2022-04-04 ENCOUNTER — Telehealth: Payer: Self-pay

## 2022-04-04 NOTE — Telephone Encounter (Signed)
  Follow up Call-     04/03/2022    9:42 AM  Call back number  Post procedure Call Back phone  # [phone]  Permission to leave phone message Yes     Patient questions:  Do you have a fever, pain , or abdominal swelling? No. Pain Score  0 *  Have you tolerated food without any problems? Yes.    Have you been able to return to your normal activities? Yes.    Do you have any questions about your discharge instructions: Diet   No. Medications  No. Follow up visit  No.  Do you have questions or concerns about your Care? No.  Actions: * If pain score is 4 or above: No action needed, pain <4.

## 2022-04-10 ENCOUNTER — Other Ambulatory Visit: Payer: Self-pay | Admitting: Medical

## 2022-04-10 DIAGNOSIS — K582 Mixed irritable bowel syndrome: Secondary | ICD-10-CM

## 2022-04-22 ENCOUNTER — Other Ambulatory Visit: Payer: Self-pay | Admitting: Medical

## 2022-05-01 IMAGING — NM NM GASTRIC EMPTYING
1 series · 1 of 1 positions shown · non-contrast
Comparison: None.

CLINICAL DATA: Epigastric pain.  Nausea and vomiting.  Diabetes.

EXAM:
NUCLEAR MEDICINE GASTRIC EMPTYING SCAN
TECHNIQUE: After oral ingestion of radiolabeled meal, sequential abdominal
images were obtained for 120 minutes. Residual percentage of
activity remaining within the stomach was calculated at 60 and 120
minutes.
RADIOPHARMACEUTICALS:  2.0 mCi Tc-CCm sulfur colloid in standardized
meal

[Series 2: 1 hr · 4.14mm/px · 1 of 1 slices shown]
[im 1/1]
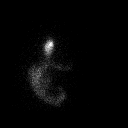

[1 of 1 positions shown; findings below may reference images not displayed]

FINDINGS: Expected location of the stomach in the left upper quadrant.
Ingested meal empties the stomach gradually over the course of the
study with 52% retention at 60 min and 5% retention at 120 min
(normal retention less than 30% at a 120 min).
IMPRESSION: Normal gastric emptying study.

## 2022-05-13 ENCOUNTER — Telehealth: Payer: Medicaid Other | Admitting: Medical

## 2022-05-22 ENCOUNTER — Other Ambulatory Visit: Payer: Self-pay | Admitting: Medical

## 2022-06-25 ENCOUNTER — Other Ambulatory Visit: Payer: Self-pay | Admitting: Family Medicine

## 2022-06-26 MED ORDER — FLUTICASONE PROPIONATE 50 MCG/ACT NA SUSP: NASAL | 0 refills | Status: DC

## 2022-06-27 ENCOUNTER — Other Ambulatory Visit: Payer: Self-pay | Admitting: Medical

## 2022-06-27 ENCOUNTER — Telehealth: Payer: Self-pay | Admitting: *Deleted

## 2022-06-27 MED ORDER — ONDANSETRON HCL 4 MG PO TABS: 4.0000 mg | ORAL_TABLET | Freq: Three times a day (TID) | ORAL | 0 refills | Status: DC | PRN

## 2022-06-27 MED ORDER — OZEMPIC (1 MG/DOSE) 4 MG/3ML ~~LOC~~ SOPN: 1.0000 mg | PEN_INJECTOR | SUBCUTANEOUS | 2 refills | Status: DC

## 2022-06-27 MED ORDER — FLUTICASONE PROPIONATE 50 MCG/ACT NA SUSP: NASAL | 0 refills | Status: DC

## 2022-06-27 NOTE — Addendum Note (Signed)
Addended by: Herminio Commons A on: 06/27/2022 09:44 AM   Modules accepted: Orders

## 2022-06-27 NOTE — Telephone Encounter (Signed)
Patient has not taken Mounjaro in 2 weeks as she is not able to get it. She said she feels fine. Do you want her to switch to something else? She was on Ozempic before. She has an appt with you on the 26th.

## 2022-06-27 NOTE — Telephone Encounter (Signed)
Patient states she had Ozempic first and they paid for it last time.

## 2022-06-28 ENCOUNTER — Other Ambulatory Visit: Payer: Self-pay | Admitting: Physician Assistant

## 2022-07-09 ENCOUNTER — Telehealth: Payer: Self-pay | Admitting: Medical

## 2022-07-09 NOTE — Telephone Encounter (Signed)
Contacted [name] to schedule their annual wellness visit. Appointment made for 07/22/22.  [name] AW[facility] direct phone # [phone]     Due to a schedule change, we have had to reschedule your medicare wellness visit from  07/18/22  to 07/22/22   Patient aware of appt date/time change

## 2022-07-16 ENCOUNTER — Other Ambulatory Visit: Payer: Self-pay | Admitting: Medical

## 2022-07-16 DIAGNOSIS — H8110 Benign paroxysmal vertigo, unspecified ear: Secondary | ICD-10-CM

## 2022-07-18 ENCOUNTER — Ambulatory Visit: Payer: Medicare HMO

## 2022-07-22 ENCOUNTER — Ambulatory Visit (INDEPENDENT_AMBULATORY_CARE_PROVIDER_SITE_OTHER): Payer: 59

## 2022-07-22 VITALS — BP 130/80 | HR 91 | Temp 98.0°F | Ht 68.0 in | Wt 212.0 lb

## 2022-07-22 DIAGNOSIS — Z0000 Encounter for general adult medical examination without abnormal findings: Secondary | ICD-10-CM

## 2022-07-22 NOTE — Patient Instructions (Signed)
[name] , Thank you for taking time to come for your Medicare Wellness Visit. I appreciate your ongoing commitment to your health goals. Please review the following plan we discussed and let me know if I can assist you in the future.   These are the goals we discussed:  Goals      Patient Stated     07/12/2021, wants to lose weight     Patient Stated     07/22/2022, wants to eat healthier, cook more at home and exercise        This is a list of the screening recommended for you and due dates:  Health Maintenance  Topic Date Due   Zoster (Shingles) Vaccine (1 of 2) Never done   COVID-19 Vaccine (3 - 2023-24 season) 11/22/2021   Yearly kidney health urinalysis for diabetes  12/12/2021   Hemoglobin A1C  04/25/2022   Complete foot exam   06/20/2022   Yearly kidney function blood test for diabetes  07/18/2022   Flu Shot  10/23/2022   Eye exam for diabetics  01/11/2023   Mammogram  05/10/2023   Medicare Annual Wellness Visit  07/22/2023   Colon Cancer Screening  08/11/2027   DTaP/Tdap/Td vaccine (3 - Td or Tdap) 07/30/2031   Hepatitis C Screening: USPSTF Recommendation to screen - Ages 50-79 yo.  Completed   HIV Screening  Completed   HPV Vaccine  Aged Out   Pap Smear  Discontinued    Advanced directives: Advance directive discussed with you today. Even though you declined this today please call our office should you change your mind and we can give you the proper paperwork for you to fill out.  Conditions/risks identified: none  Next appointment: Follow up in one year for your annual wellness visit.   Preventive Care 40-64 Years, Female Preventive care refers to lifestyle choices and visits with your health care provider that can promote health and wellness. What does preventive care include? A yearly physical exam. This is also called an annual well check. Dental exams once or twice a year. Routine eye exams. Ask your health care provider how often you should have your  eyes checked. Personal lifestyle choices, including: Daily care of your teeth and gums. Regular physical activity. Eating a healthy diet. Avoiding tobacco and drug use. Limiting alcohol use. Practicing safe sex. Taking low-dose aspirin daily starting at age 98. Taking vitamin and mineral supplements as recommended by your health care provider. What happens during an annual well check? The services and screenings done by your health care provider during your annual well check will depend on your age, overall health, lifestyle risk factors, and family history of disease. Counseling  Your health care provider may ask you questions about your: Alcohol use. Tobacco use. Drug use. Emotional well-being. Home and relationship well-being. Sexual activity. Eating habits. Work and work Astronomer. Method of birth control. Menstrual cycle. Pregnancy history. Screening  You may have the following tests or measurements: Height, weight, and BMI. Blood pressure. Lipid and cholesterol levels. These may be checked every 5 years, or more frequently if you are over 73 years old. Skin check. Lung cancer screening. You may have this screening every year starting at age 38 if you have a 30-pack-year history of smoking and currently smoke or have quit within the past 15 years. Fecal occult blood test (FOBT) of the stool. You may have this test every year starting at age 6. Flexible sigmoidoscopy or colonoscopy. You may have a sigmoidoscopy every 5  years or a colonoscopy every 10 years starting at age 77. Hepatitis C blood test. Hepatitis B blood test. Sexually transmitted disease (STD) testing. Diabetes screening. This is done by checking your blood sugar (glucose) after you have not eaten for a while (fasting). You may have this done every 1-3 years. Mammogram. This may be done every 1-2 years. Talk to your health care provider about when you should start having regular mammograms. This may depend  on whether you have a family history of breast cancer. BRCA-related cancer screening. This may be done if you have a family history of breast, ovarian, tubal, or peritoneal cancers. Pelvic exam and Pap test. This may be done every 3 years starting at age 60. Starting at age 25, this may be done every 5 years if you have a Pap test in combination with an HPV test. Bone density scan. This is done to screen for osteoporosis. You may have this scan if you are at high risk for osteoporosis. Discuss your test results, treatment options, and if necessary, the need for more tests with your health care provider. Vaccines  Your health care provider may recommend certain vaccines, such as: Influenza vaccine. This is recommended every year. Tetanus, diphtheria, and acellular pertussis (Tdap, Td) vaccine. You may need a Td booster every 10 years. Zoster vaccine. You may need this after age 45. Pneumococcal 13-valent conjugate (PCV13) vaccine. You may need this if you have certain conditions and were not previously vaccinated. Pneumococcal polysaccharide (PPSV23) vaccine. You may need one or two doses if you smoke cigarettes or if you have certain conditions. Talk to your health care provider about which screenings and vaccines you need and how often you need them. This information is not intended to replace advice given to you by your health care provider. Make sure you discuss any questions you have with your health care provider. Document Released: 04/06/2015 Document Revised: 11/28/2015 Document Reviewed: 01/09/2015 Elsevier Interactive Patient Education  2017 [employer].    Fall Prevention in the Home Falls can cause injuries. They can happen to people of all ages. There are many things you can do to make your home safe and to help prevent falls. What can I do on the outside of my home? Regularly fix the edges of walkways and driveways and fix any cracks. Remove anything that might make you trip as  you walk through a door, such as a raised step or threshold. Trim any bushes or trees on the path to your home. Use bright outdoor lighting. Clear any walking paths of anything that might make someone trip, such as rocks or tools. Regularly check to see if handrails are loose or broken. Make sure that both sides of any steps have handrails. Any raised decks and porches should have guardrails on the edges. Have any leaves, snow, or ice cleared regularly. Use sand or salt on walking paths during winter. Clean up any spills in your garage right away. This includes oil or grease spills. What can I do in the bathroom? Use night lights. Install grab bars by the toilet and in the tub and shower. Do not use towel bars as grab bars. Use non-skid mats or decals in the tub or shower. If you need to sit down in the shower, use a plastic, non-slip stool. Keep the floor dry. Clean up any water that spills on the floor as soon as it happens. Remove soap buildup in the tub or shower regularly. Attach bath mats securely with double-sided  non-slip rug tape. Do not have throw rugs and other things on the floor that can make you trip. What can I do in the bedroom? Use night lights. Make sure that you have a light by your bed that is easy to reach. Do not use any sheets or blankets that are too big for your bed. They should not hang down onto the floor. Have a firm chair that has side arms. You can use this for support while you get dressed. Do not have throw rugs and other things on the floor that can make you trip. What can I do in the kitchen? Clean up any spills right away. Avoid walking on wet floors. Keep items that you use a lot in easy-to-reach places. If you need to reach something above you, use a strong step stool that has a grab bar. Keep electrical cords out of the way. Do not use floor polish or wax that makes floors slippery. If you must use wax, use non-skid floor wax. Do not have throw  rugs and other things on the floor that can make you trip. What can I do with my stairs? Do not leave any items on the stairs. Make sure that there are handrails on both sides of the stairs and use them. Fix handrails that are broken or loose. Make sure that handrails are as long as the stairways. Check any carpeting to make sure that it is firmly attached to the stairs. Fix any carpet that is loose or worn. Avoid having throw rugs at the top or bottom of the stairs. If you do have throw rugs, attach them to the floor with carpet tape. Make sure that you have a light switch at the top of the stairs and the bottom of the stairs. If you do not have them, ask someone to add them for you. What else can I do to help prevent falls? Wear shoes that: Do not have high heels. Have rubber bottoms. Are comfortable and fit you well. Are closed at the toe. Do not wear sandals. If you use a stepladder: Make sure that it is fully opened. Do not climb a closed stepladder. Make sure that both sides of the stepladder are locked into place. Ask someone to hold it for you, if possible. Clearly mark and make sure that you can see: Any grab bars or handrails. First and last steps. Where the edge of each step is. Use tools that help you move around (mobility aids) if they are needed. These include: Canes. Walkers. Scooters. Crutches. Turn on the lights when you go into a dark area. Replace any light bulbs as soon as they burn out. Set up your furniture so you have a clear path. Avoid moving your furniture around. If any of your floors are uneven, fix them. If there are any pets around you, be aware of where they are. Review your medicines with your doctor. Some medicines can make you feel dizzy. This can increase your chance of falling. Ask your doctor what other things that you can do to help prevent falls. This information is not intended to replace advice given to you by your health care provider. Make  sure you discuss any questions you have with your health care provider. Document Released: 01/04/2009 Document Revised: 08/16/2015 Document Reviewed: 04/14/2014 Elsevier Interactive Patient Education  2017 [employer].

## 2022-07-22 NOTE — Progress Notes (Signed)
Subjective:   [name] is a 62 y.o. female who presents for Medicare Annual (Subsequent) preventive examination.  Review of Systems     Cardiac Risk Factors include: diabetes mellitus;dyslipidemia;obesity (BMI >30kg/m2)     Objective:    Today's Vitals   07/22/22 1159 07/22/22 1204  BP: 130/80   Pulse: 91   Temp: 98 F (36.7 C)   TempSrc: Oral   SpO2: 96%   Weight: 212 lb (96.2 kg)   Height: 5\' 8"  (1.727 m)   PainSc:  5    Body mass index is 32.23 kg/m.     07/22/2022   12:13 PM 07/12/2021   11:55 AM 02/15/2021    3:02 PM 06/27/2020    3:33 PM 02/28/2020    6:26 AM 02/20/2020   11:14 AM 08/23/2019   12:11 PM  Advanced Directives  Does Patient Have a Medical Advance Directive? No No No No No No No  Would patient like information on creating a medical advance directive? No - Patient declined No - Patient declined   No - Patient declined No - Patient declined No - Patient declined    Current Medications (verified) Outpatient Encounter Medications as of 07/22/2022  Medication Sig   albuterol (VENTOLIN HFA) 108 (90 Base) MCG/ACT inhaler Inhale 2 puffs into the lungs every 6 (six) hours as needed for wheezing.   Cholecalciferol (VITAMIN D) 50 MCG (2000 UT) CAPS Take 1 capsule (2,000 Units total) by mouth daily.   clonazePAM (KLONOPIN) 0.5 MG tablet Take 0.5 mg by mouth daily as needed.   diclofenac Sodium (VOLTAREN) 1 % GEL APPLY 4 G TOPICALLY 4 (FOUR) TIMES DAILY.   fluticasone (FLONASE) 50 MCG/ACT nasal spray SPRAY 2 SPRAYS INTO EACH NOSTRIL EVERY DAY   gabapentin (NEURONTIN) 100 MG capsule Take 100 mg by mouth 3 (three) times daily.   Glucose Blood (BLOOD GLUCOSE TEST STRIPS) STRP Test once a day. Pt has a onetouch verio flex meter dx e11.9   hyoscyamine (LEVBID) 0.375 MG 12 hr tablet TAKE 1 TABLET (0.375 MG TOTAL) BY MOUTH EVERY 12 (TWELVE) HOURS AS NEEDED.   Lancets (ONETOUCH ULTRASOFT) lancets Test once a day. Dx E11.9 pt has onetouch verio flex meter    losartan (COZAAR) 25 MG tablet Take 1 tablet (25 mg total) by mouth daily.   Melatonin 5 MG CAPS Take 5 mg by mouth at bedtime as needed (sleep).    montelukast (SINGULAIR) 10 MG tablet Take 1 tablet (10 mg total) by mouth at bedtime.   Multiple Vitamin (MULTIVITAMIN) tablet Take 1 tablet daily by mouth.   omeprazole (PRILOSEC) 40 MG capsule TAKE 1 CAPSULE BY MOUTH TWICE A DAY   ondansetron (ZOFRAN) 4 MG tablet Take 1 tablet (4 mg total) by mouth every 8 (eight) hours as needed for nausea or vomiting.   Semaglutide, 1 MG/DOSE, (OZEMPIC, 1 MG/DOSE,) 4 MG/3ML SOPN Inject 1 mg into the skin once a week.   venlafaxine XR (EFFEXOR-XR) 150 MG 24 hr capsule Take 150 mg by mouth daily with breakfast.   zolpidem (AMBIEN) 10 MG tablet Take 10 mg by mouth at bedtime as needed for sleep.   aspirin 81 MG tablet Take 81 mg by mouth daily. (Patient not taking: Reported on 07/22/2022)   budesonide-formoterol (SYMBICORT) 160-4.5 MCG/ACT inhaler Inhale 2 puffs into the lungs 2 (two) times daily. (Patient not taking: Reported on 07/22/2022)   Cyanocobalamin (B-12 PO) Take by mouth. (Patient not taking: Reported on 04/03/2022)   nitrofurantoin, macrocrystal-monohydrate, (MACROBID) 100 MG  capsule Take 1 capsule (100 mg total) by mouth 2 (two) times daily. (Patient not taking: Reported on 07/22/2022)   rosuvastatin (CRESTOR) 20 MG tablet Take 1 tablet (20 mg total) by mouth daily.   tirzepatide (MOUNJARO) 7.5 MG/0.5ML Pen INJECT 7.5 MG SUBCUTANEOUSLY WEEKLY (Patient not taking: Reported on 07/22/2022)   No facility-administered encounter medications on file as of 07/22/2022.    Allergies (verified) Percocet [oxycodone-acetaminophen] and Lamictal [lamotrigine]   History: Past Medical History:  Diagnosis Date   Abdominal pain    due to fibroids   Anxiety    Arthritis    hips and knees   Asthma    stress induced asthma   Chest pain 06/2019   Chlamydia    Chronic kidney disease    ckd stage 1 per 5-26-201 dr Lawson Fiscal  foster ov note   Depression    DM type 2 (diabetes mellitus, type 2) (HCC) dx jan 2021   Fibroid    uterine   GERD (gastroesophageal reflux disease)    Hepatic steatosis 12/05/2019   Hyperlipidemia    IBS (irritable bowel syndrome)    Liver nodule 1999   no follow up needed was due to stress   Morbid obesity (HCC)    Neuropathy    both fingers and toes at times   Obesity    Protein in urine 03/2019   Trichomonas    Tubular adenoma of colon 01/2012   Past Surgical History:  Procedure Laterality Date   colonscopy  2017   COLPOSCOPY  1990   results were normal   CYSTOSCOPY  02/28/2020   Procedure: CYSTOSCOPY;  Surgeon: Willodean Rosenthal, MD;  Location: [facility] [place];  Service: Gynecology;;   DILITATION & CURRETTAGE/HYSTROSCOPY WITH NOVASURE ABLATION N/A 08/23/2019   Procedure: DILATATION & CURETTAGE/DIAGNOSTIC HYSTEROSCOPY;  Surgeon: Willodean Rosenthal, MD;  Location: [facility];  Service: Gynecology;  Laterality: N/A;   IR GENERIC HISTORICAL  03/21/2016   IR EMBO TUMOR ORGAN ISCHEMIA INFARCT INC GUIDE ROADMAPPING 03/21/2016 Oley Balm, MD [facility]   IR GENERIC HISTORICAL  03/21/2016   IR US GUIDE VASC ACCESS RIGHT 03/21/2016 Oley Balm, MD [facility]   IR GENERIC HISTORICAL  03/21/2016   IR ANGIOGRAM SELECTIVE EACH ADDITIONAL VESSEL 03/21/2016 Oley Balm, MD [facility]   IR GENERIC HISTORICAL  03/21/2016   IR ANGIOGRAM SELECTIVE EACH ADDITIONAL VESSEL 03/21/2016 Oley Balm, MD [facility]   IR GENERIC HISTORICAL  03/21/2016   IR ANGIOGRAM PELVIS SELECTIVE OR SUPRASELECTIVE 03/21/2016 Oley Balm, MD [facility]   IR GENERIC HISTORICAL  03/21/2016   IR ANGIOGRAM PELVIS SELECTIVE OR SUPRASELECTIVE 03/21/2016 Oley Balm, MD [facility]   IR GENERIC HISTORICAL  02/27/2016   IR RADIOLOGIST EVAL & MGMT 02/27/2016 Oley Balm, MD [facility]   IR GENERIC HISTORICAL  04/03/2016   IR RADIOLOGIST  EVAL & MGMT 04/03/2016 Oley Balm, MD [facility]   IR RADIOLOGIST EVAL & MGMT  08/26/2016   labial cyst removal     ROBOTIC ASSISTED TOTAL HYSTERECTOMY Bilateral 02/28/2020   Procedure: XI ROBOTIC ASSISTED TOTAL HYSTERECTOMY AND BILATERAL SALPINGECTOMY;  Surgeon: Willodean Rosenthal, MD;  Location: [facility] [place];  Service: Gynecology;  Laterality: Bilateral;   TUBAL LIGATION     20 years ago   UPPER GASTROINTESTINAL ENDOSCOPY     Family History  Problem Relation Age of Onset   Diabetes Father    Diabetes Mother    Hypertension Mother    Deep vein thrombosis Mother  Cancer - Other Mother        peritoneal cancer   Hiatal hernia Mother    Colon cancer Maternal Grandmother    Colon polyps Maternal Grandmother    Hypertension Sister    Deep vein thrombosis Sister    Diabetes Sister    Hiatal hernia Sister    Rectal cancer Neg Hx    Stomach cancer Neg Hx    Pancreatic cancer Neg Hx    Kidney disease Neg Hx    Liver disease Neg Hx    Social History   Socioeconomic History   Marital status: Divorced    Spouse name: Not on file   Number of children: 2   Years of education: Not on file   Highest education level: Not on file  Occupational History   Not on file  Tobacco Use   Smoking status: Never   Smokeless tobacco: Never  Vaping Use   Vaping Use: Never used  Substance and Sexual Activity   Alcohol use: Not Currently    Comment: occ, not weekly   Drug use: No   Sexual activity: Yes  Other Topics Concern   Not on file  Social History Narrative   Not on file   Social Determinants of Health   Financial Resource Strain: Low Risk  (07/22/2022)   Overall Financial Resource Strain (CARDIA)    Difficulty of Paying Living Expenses: Not hard at all  Food Insecurity: No Food Insecurity (07/22/2022)   Hunger Vital Sign    Worried About Running Out of Food in the Last Year: Never true    Ran Out of Food in the Last Year: Never true  Transportation  Needs: No Transportation Needs (07/22/2022)   PRAPARE - Administrator, Civil Service (Medical): No    Lack of Transportation (Non-Medical): No  Physical Activity: Inactive (07/22/2022)   Exercise Vital Sign    Days of Exercise per Week: 0 days    Minutes of Exercise per Session: 0 min  Stress: No Stress Concern Present (07/22/2022)   [employer] of Occupational Health - Occupational Stress Questionnaire    Feeling of Stress : Only a little  Social Connections: Not on file    Tobacco Counseling Counseling given: Not Answered   Clinical Intake:  Pre-visit preparation completed: Yes  Pain : 0-10 Pain Score: 5  Pain Type: Chronic pain Pain Location: Hip Pain Orientation: Right Pain Descriptors / Indicators: Aching Pain Onset: More than a month ago Pain Frequency: Intermittent     Nutritional Status: BMI > 30  Obese Nutritional Risks: Nausea/ vomitting/ diarrhea (nausea a week or two ago, but has gotten better) Diabetes: Yes  How often do you need to have someone help you when you read instructions, pamphlets, or other written materials from your doctor or pharmacy?: 1 - Never  Diabetic? Yes Nutrition Risk Assessment:  Has the patient had any N/V/D within the last 2 months?  Yes  Does the patient have any non-healing wounds?  No  Has the patient had any unintentional weight loss or weight gain?  No   Diabetes:  Is the patient diabetic?  Yes  If diabetic, was a CBG obtained today?  No  Did the patient bring in their glucometer from home?  No  How often do you monitor your CBG's? Twice weekly.   Financial Strains and Diabetes Management:  Are you having any financial strains with the device, your supplies or your medication? No .  Does the patient want to  be seen by Chronic Care Management for management of their diabetes?  No  Would the patient like to be referred to a Nutritionist or for Diabetic Management?  No   Diabetic Exams:  Diabetic Eye  Exam: Completed 01/10/2022 Diabetic Foot Exam: Completed 06/19/2021   Interpreter Needed?: No  Information entered by :: NAllen LPN   Activities of Daily Living    07/22/2022   12:16 PM  In your present state of health, do you have any difficulty performing the following activities:  Hearing? 1  Comment decreased hearing of right ear  Vision? 1  Comment less focused  Difficulty concentrating or making decisions? 1  Walking or climbing stairs? 1  Comment due to knees  Dressing or bathing? 0  Doing errands, shopping? 0  Preparing Food and eating ? N  Using the Toilet? N  In the past six months, have you accidently leaked urine? N  Do you have problems with loss of bowel control? N  Managing your Medications? N  Managing your Finances? N  Housekeeping or managing your Housekeeping? N    Patient Care Team: [name], Kermit Balo, PA-C as PCP - General (Family Medicine) Estanislado Emms, MD as Consulting Physician (Nephrology) Verner Chol, [facility] (Inactive) as Pharmacist (Pharmacist)  Indicate any recent Medical Services you may have received from other than [facility] providers in the past year (date may be approximate).     Assessment:   This is a routine wellness examination for [name].  Hearing/Vision screen Vision Screening - Comments:: Regular eye exams, [facility]  Dietary issues and exercise activities discussed: Current Exercise Habits: The patient does not participate in regular exercise at present   Goals Addressed             This Visit's Progress    Patient Stated       07/22/2022, wants to eat healthier, cook more at home and exercise       Depression Screen    07/22/2022   12:14 PM 01/03/2022   10:14 AM 10/23/2021    4:01 PM 07/12/2021   11:57 AM 06/19/2021   11:05 AM 04/15/2021    3:15 PM 04/15/2021    3:14 PM  PHQ 2/9 Scores  PHQ - 2 Score 0 5 0 0 0 1 0  PHQ- 9 Score 3 15  4  8      Fall Risk    07/22/2022   12:14 PM 10/23/2021    4:01 PM  07/12/2021   11:56 AM 06/19/2021   11:05 AM 04/15/2021    3:14 PM  Fall Risk   Falls in the past year? 0 1 0 0 0  Comment   fell off curb twice    Number falls in past yr: 0 0 1 0 0  Injury with Fall? 0 0 0 0 0  Risk for fall due to : Medication side effect Impaired balance/gait Medication side effect No Fall Risks No Fall Risks  Follow up Falls prevention discussed;Education provided;Falls evaluation completed Falls evaluation completed Falls evaluation completed;Education provided;Falls prevention discussed Falls evaluation completed Falls evaluation completed    FALL RISK PREVENTION PERTAINING TO THE HOME:  Any stairs in or around the home? Yes  If so, are there any without handrails? No  Home free of loose throw rugs in walkways, pet beds, electrical cords, etc? Yes  Adequate lighting in your home to reduce risk of falls? Yes   ASSISTIVE DEVICES UTILIZED TO PREVENT FALLS:  Life alert? No  Use of  a cane, walker or w/c? No  Grab bars in the bathroom? Yes  Shower chair or bench in shower? Yes  Elevated toilet seat or a handicapped toilet? Yes   TIMED UP AND GO:  Was the test performed? Yes .  Length of time to ambulate 10 feet: 5 sec.   Gait steady and fast without use of assistive device  Cognitive Function:        07/22/2022   12:19 PM 07/12/2021   12:02 PM  6CIT Screen  What Year? 0 points 0 points  What month? 0 points 0 points  What time? 0 points 0 points  Count back from 20 0 points 0 points  Months in reverse 0 points 0 points  Repeat phrase 2 points 2 points  Total Score 2 points 2 points    Immunizations Immunization History  Administered Date(s) Administered   Hepb-cpg 07/24/2021, 10/23/2021   Influenza Whole 02/06/2009   Influenza,inj,Quad PF,6+ Mos 01/28/2017, 02/02/2018   PFIZER(Purple Top)SARS-COV-2 Vaccination 07/07/2019, 07/28/2019   Td 08/01/2005   Tdap 07/29/2021    TDAP status: Up to date  Flu Vaccine status: Up to date  Pneumococcal  vaccine status: Up to date  Covid-19 vaccine status: Completed vaccines  Qualifies for Shingles Vaccine? Yes   Zostavax completed No   Shingrix Completed?: No.    Education has been provided regarding the importance of this vaccine. Patient has been advised to call insurance company to determine out of pocket expense if they have not yet received this vaccine. Advised may also receive vaccine at local pharmacy or Health Dept. Verbalized acceptance and understanding.  Screening Tests Health Maintenance  Topic Date Due   Zoster Vaccines- Shingrix (1 of 2) Never done   COVID-19 Vaccine (3 - 2023-24 season) 11/22/2021   Diabetic kidney evaluation - Urine ACR  12/12/2021   HEMOGLOBIN A1C  04/25/2022   FOOT EXAM  06/20/2022   Medicare Annual Wellness (AWV)  07/13/2022   Diabetic kidney evaluation - eGFR measurement  07/18/2022   INFLUENZA VACCINE  10/23/2022   OPHTHALMOLOGY EXAM  01/11/2023   MAMMOGRAM  05/10/2023   COLONOSCOPY (Pts 45-72yrs Insurance coverage will need to be confirmed)  08/11/2027   DTaP/Tdap/Td (3 - Td or Tdap) 07/30/2031   Hepatitis C Screening  Completed   HIV Screening  Completed   HPV VACCINES  Aged Out   PAP SMEAR-Modifier  Discontinued    Health Maintenance  Health Maintenance Due  Topic Date Due   Zoster Vaccines- Shingrix (1 of 2) Never done   COVID-19 Vaccine (3 - 2023-24 season) 11/22/2021   Diabetic kidney evaluation - Urine ACR  12/12/2021   HEMOGLOBIN A1C  04/25/2022   FOOT EXAM  06/20/2022   Medicare Annual Wellness (AWV)  07/13/2022   Diabetic kidney evaluation - eGFR measurement  07/18/2022    Colorectal cancer screening: Type of screening: Colonoscopy. Completed 08/10/2017. Repeat every 5 years  Mammogram status: Completed 05/09/2021. Repeat every year  Bone Density status: n/a  Lung Cancer Screening: (Low Dose CT Chest recommended if Age 87-80 years, 30 pack-year currently smoking OR have quit w/in 15years.) does not qualify.   Lung  Cancer Screening Referral: no  Additional Screening:  Hepatitis C Screening: does qualify; Completed 02/21/2018  Vision Screening: Recommended annual ophthalmology exams for early detection of glaucoma and other disorders of the eye. Is the patient up to date with their annual eye exam?  Yes  Who is the provider or what is the name of the office in  which the patient attends annual eye exams? [facility] If pt is not established with a provider, would they like to be referred to a provider to establish care? No .   Dental Screening: Recommended annual dental exams for proper oral hygiene  Community Resource Referral / Chronic Care Management: CRR required this visit?  No   CCM required this visit?  No      Plan:     I have personally reviewed and noted the following in the patient's chart:   Medical and social history Use of alcohol, tobacco or illicit drugs  Current medications and supplements including opioid prescriptions. Patient is not currently taking opioid prescriptions. Functional ability and status Nutritional status Physical activity Advanced directives List of other physicians Hospitalizations, surgeries, and ER visits in previous 12 months Vitals Screenings to include cognitive, depression, and falls Referrals and appointments  In addition, I have reviewed and discussed with patient certain preventive protocols, quality metrics, and best practice recommendations. A written personalized care plan for preventive services as well as general preventive health recommendations were provided to patient.     Barb Merino, LPN   1/61/0960   Nurse Notes: none

## 2022-07-23 ENCOUNTER — Other Ambulatory Visit: Payer: Self-pay | Admitting: Medical

## 2022-07-23 ENCOUNTER — Encounter: Payer: Self-pay | Admitting: Gastroenterology

## 2022-08-06 ENCOUNTER — Ambulatory Visit: Payer: 59 | Admitting: Family Medicine

## 2022-08-19 DIAGNOSIS — R809 Proteinuria, unspecified: Secondary | ICD-10-CM | POA: Diagnosis not present

## 2022-08-19 DIAGNOSIS — I129 Hypertensive chronic kidney disease with stage 1 through stage 4 chronic kidney disease, or unspecified chronic kidney disease: Secondary | ICD-10-CM | POA: Diagnosis not present

## 2022-08-19 DIAGNOSIS — N181 Chronic kidney disease, stage 1: Secondary | ICD-10-CM | POA: Diagnosis not present

## 2022-08-19 DIAGNOSIS — E1129 Type 2 diabetes mellitus with other diabetic kidney complication: Secondary | ICD-10-CM | POA: Diagnosis not present

## 2022-08-19 DIAGNOSIS — E1122 Type 2 diabetes mellitus with diabetic chronic kidney disease: Secondary | ICD-10-CM | POA: Diagnosis not present

## 2022-08-19 DIAGNOSIS — K219 Gastro-esophageal reflux disease without esophagitis: Secondary | ICD-10-CM | POA: Diagnosis not present

## 2022-08-20 LAB — LAB REPORT - SCANNED
Albumin, Urine POC: 10.3
EGFR: 77
Microalb Creat Ratio: 51

## 2022-08-22 ENCOUNTER — Other Ambulatory Visit: Payer: Self-pay | Admitting: Medical

## 2022-08-22 DIAGNOSIS — R809 Proteinuria, unspecified: Secondary | ICD-10-CM

## 2022-08-27 NOTE — Progress Notes (Signed)
Chief Complaint  Patient presents with   Consult    Headaches and dizziness over the last few months. She loses her balance sometimes, has not had any falls though. Dizziness Tuesday was just when she was sitting, after drinking two bottles of water she did feel better. She does drink at least 64oz of water daily. She states she has been having a lot of anxiety and wonders if this could anything to do with dizziness. Took meclizine Tues and Wed and sudafed yesterday and the headache is not like it has been. Still some dizziness.    OTHER    Needs a note for work. She states she does not want to go back. She has not been there since July 17, 2022. Tried to pick up a shift last Wed and had a full blown panic attack.     6/4 Tues she went to her granddaughter's graduation.  Hadn't eaten anything that morning except an apple. She went to their house afterwards. She was sitting, felt dizzy. She felt lightheaded, drank water and felt better. Later in the day she had recurrent LH.  She saw nephrologist last week.  Losartan dose was decreased to 12.5 mg (has a large supply and has started cutting the tablets). BP was low at that visit, and feeling dizzy.   She monitors BP at home.  Yesterday was 130/87 (wrist monitor). This monitor hasn't been checked for accuracy. Unsure if any improvement in the dizziness with cutting the losartan dose. She took a sudafed PE yesterday, and that seemed to help with her dizziness.  The BP above was taken PRIOR to taking the decongestant. She took the sudafed today as well.  She hasn't had any vertigo recently (just severe spell years ago).  She is complaining of headaches.  She reports having issues with allergies, deviated septum. Her headache currently is at her R temple. She has occasional sinus pain/pressure. She gets pain at R>L temples.  She grinds her teeth, uses a bite guard at night. She has been taking 24 hour allergy med (cetirizine), needs decongestant  periodically (phenylephrine).  She also takes a dollar tree sinus pain and congestion.   Saw ENT 2017 and again 2021.  She saw PA Louise in 2017 who told her about the deviated Septum, recommended the flonase and zyrtec. She went back and saw an MD in 2021, didn't find that visit helpful.   She hasn't been to work since 4/25. She is on disability for anxiety. She is asking for a note to be out of work for a couple of weeks.  She sees [employer], Theatre manager. Saw her (virtually) a week and a half ago. 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  4/25 something stressful occurred (62 yo almost eloped, felt mentally exhausted trying to get him back without picking him up, using the techniques they are supposed to, to get him to make that decision on his own).  Has had panic attacks.  They are calling her asking for her availability for work, and she doesn't feel like she can go back now. She has other stressors, including car trouble, Mee-maw passed away, recent move 07-03-2022).  PMH, PSH, SH reviewed  Outpatient Encounter Medications as of 08/28/2022  Medication Sig Note   buPROPion (WELLBUTRIN XL) 150 MG 24 hr tablet Take 150 mg by mouth every morning.    Cholecalciferol (VITAMIN D) 50 MCG (2000 UT) CAPS Take 1 capsule (2,000 Units total) by mouth daily.  clonazePAM (KLONOPIN) 0.5 MG tablet Take 0.5 mg by mouth daily as needed. 08/28/2022: As needed, did take one last night   fluticasone (FLONASE) 50 MCG/ACT nasal spray SPRAY 2 SPRAYS INTO EACH NOSTRIL EVERY DAY    gabapentin (NEURONTIN) 100 MG capsule Take 100 mg by mouth 3 (three) times daily. 08/28/2022: Taking 2 tablets once daily   Glucose Blood (BLOOD GLUCOSE TEST STRIPS) STRP Test once a day. Pt has a onetouch verio flex meter dx e11.9    Lancets (ONETOUCH ULTRASOFT) lancets Test once a day. Dx E11.9 pt has onetouch verio flex meter    losartan (COZAAR) 25 MG tablet TAKE 1 TABLET (25 MG TOTAL) BY MOUTH DAILY.    meclizine  (ANTIVERT) 25 MG tablet Take 25 mg by mouth 3 (three) times daily as needed for dizziness. 08/28/2022: Took one yesterday   montelukast (SINGULAIR) 10 MG tablet Take 1 tablet (10 mg total) by mouth at bedtime. 08/28/2022: Took one Tuesday night   Multiple Vitamin (MULTIVITAMIN) tablet Take 1 tablet daily by mouth.    omeprazole (PRILOSEC) 40 MG capsule TAKE 1 CAPSULE BY MOUTH TWICE A DAY    ondansetron (ZOFRAN) 4 MG tablet Take 1 tablet (4 mg total) by mouth every 8 (eight) hours as needed for nausea or vomiting. 08/28/2022: Took one yesterday   phenylephrine (SUDAFED PE) 10 MG TABS tablet Take 10 mg by mouth every 4 (four) hours as needed. 08/28/2022: Took one yesterday   rosuvastatin (CRESTOR) 20 MG tablet TAKE 1 TABLET BY MOUTH EVERY DAY    Semaglutide, 1 MG/DOSE, (OZEMPIC, 1 MG/DOSE,) 4 MG/3ML SOPN Inject 1 mg into the skin once a week. 08/28/2022: Takes on Sunday, but did Wed this week   venlafaxine XR (EFFEXOR-XR) 150 MG 24 hr capsule Take 150 mg by mouth daily with breakfast.    albuterol (VENTOLIN HFA) 108 (90 Base) MCG/ACT inhaler Inhale 2 puffs into the lungs every 6 (six) hours as needed for wheezing. (Patient not taking: Reported on 08/28/2022) 08/28/2022: As needed, rarely   aspirin 81 MG tablet Take 81 mg by mouth daily. (Patient not taking: Reported on 07/22/2022) 08/28/2022: Ran out   diclofenac Sodium (VOLTAREN) 1 % GEL APPLY 4 G TOPICALLY 4 (FOUR) TIMES DAILY. (Patient not taking: Reported on 08/28/2022) 08/28/2022: As needed   hyoscyamine (LEVBID) 0.375 MG 12 hr tablet TAKE 1 TABLET (0.375 MG TOTAL) BY MOUTH EVERY 12 (TWELVE) HOURS AS NEEDED. (Patient not taking: Reported on 08/28/2022) 08/28/2022: As needed   Melatonin 5 MG CAPS Take 5 mg by mouth at bedtime as needed (sleep).  (Patient not taking: Reported on 08/28/2022) 08/28/2022: As needed, took one Tuesday night   zolpidem (AMBIEN) 10 MG tablet Take 10 mg by mouth at bedtime as needed for sleep. (Patient not taking: Reported on 08/28/2022) 08/28/2022: As  needed   [DISCONTINUED] budesonide-formoterol (SYMBICORT) 160-4.5 MCG/ACT inhaler Inhale 2 puffs into the lungs 2 (two) times daily. (Patient not taking: Reported on 07/22/2022)    [DISCONTINUED] Cyanocobalamin (B-12 PO) Take by mouth. (Patient not taking: Reported on 04/03/2022)    [DISCONTINUED] nitrofurantoin, macrocrystal-monohydrate, (MACROBID) 100 MG capsule Take 1 capsule (100 mg total) by mouth 2 (two) times daily. (Patient not taking: Reported on 07/22/2022)    [DISCONTINUED] tirzepatide (MOUNJARO) 7.5 MG/0.5ML Pen INJECT 7.5 MG SUBCUTANEOUSLY WEEKLY (Patient not taking: Reported on 07/22/2022)    No facility-administered encounter medications on file as of 08/28/2022.   Allergies  Allergen Reactions   Percocet [Oxycodone-Acetaminophen] Itching   Lamictal [Lamotrigine] Rash   ROS:  no fever, chills, cough, shortness of breath, CP, edema.  +headaches and allergies per HPI. +anxiety per HPI.  +dizziness, lightheadedness, per HPI.  No vertigo.   See HPI   PHYSICAL EXAM:  BP (!) 110/90 (BP Location: Right Arm, Cuff Size: Normal)   Pulse 100   Temp 98.2 F (36.8 C) (Tympanic)   Ht 5\' 7"  (1.702 m)   Wt 210 lb 12.8 oz (95.6 kg)   LMP 06/23/2014   BMI 33.02 kg/m   Orthostatic Vitals for the past 48 hrs (Last 6 readings):  Patient Position BP Pulse BP Location BP Method Cuff Size Patient Position (if appropriate)  08/28/22 1143 Sitting (!) 110/90 100 Right Arm -- Normal --  08/28/22 1155 -- 110/70 100 Right Arm -- Normal Lying  08/28/22 1158 -- (!) 110/90 100 Right Arm Manual Normal Standing    Pleasant, well-appearing female in no distress HEENT: conjunctiva and sclera are clear, EOMI, fundi benign. TM's--not well visualized, due to partial occlusion by cerumen, R>L.  Nasal mucosa is mild-mod edematous.  Septum deviated to the R, with narrowed airway. No erythema or purulence Tender at R>L temporalis muscles.  Nontender over sinuses. Neck: no lymphadenopathy or mass Heart: regular  rate and rhythm Lungs: clear bilaterally Abdomen: soft, nontender Neuro: alert and oriented. Cranial nerves intact. Normal strength, sensation, DTR's, finger to nose.  Normal gait. Psych: anxious. Full range of affect. Normal eye contact, speech, hygiene and grooming  ASSESSMENT/PLAN:  Dizziness - lightheaded; ARB dose recently decreased. Normal neuro exam; monitor BP, stay well hydrated.  No recent vertigo  Right temporal headache - related to anxiety/grinding, and gum-chewing. Due to CKD, no NSAIDs. tylenol, ice vs heat prn. Avoid clenching/gum chewing  Deviated septum - continue flonase, zyrtec, decongestant prn (if BP ok), sinus rinses prn. Disc potential indications for treatment/sx (not needed now)  Allergic rhinitis, unspecified seasonality, unspecified trigger - cont zyrtec, flonase, sudafed prn (if BP okay). Discussed nasal saline (spray vs sinus rinses prn)  Anxiety and depression - Not adequately controlled, and reason she can't RTW. She is on disability for anxiety and has psych--time off needs to come from psych  Shane's patient, long overdue for med check. Had AWV with nurse in April. Many gaps in care.   Last A1c was 10/2021, lipids 05/2021, foot exam 05/2021, chem panel a year ago.  She was advised to schedule a med check (and/or CPE) with Vincenza Hews in the near future.  I spent 46 minutes dedicated to the care of this patient, including pre-visit review of records, face to face time, post-visit ordering of testing and documentation.   Bring your blood pressure monitor to your next office visit so that we can verify the accuracy/technique, to know whether your home readings are trustworthy or not.  Continue the flonase daily, as we discussed (gentle sniffs, 2 sprays into each nostril daily). Continue antihistamine (cetirizine). I encourage you to resume sinus rinses, and/or regular use of saline spray You had some wax in the ears, not blocking.  If you have decreased hearing or  pain, you can return for an ear lavage.  You can try using Debrox drops to soften the wax.  Please talk to your psychiatrist regarding your stress and notes for work. Your headaches are partly muscular, likely related to your stress (and grinding, clenching, gum-chewing). Heat/ice can help as needed.  The lightheadedness can be related to dehydration and lower blood pressures related to your medication.  Hopefully those will improve as your dose of losartan  was cut.  Be sure to stay well hydrated.

## 2022-08-28 ENCOUNTER — Encounter: Payer: Self-pay | Admitting: Family Medicine

## 2022-08-28 ENCOUNTER — Ambulatory Visit (INDEPENDENT_AMBULATORY_CARE_PROVIDER_SITE_OTHER): Payer: 59 | Admitting: Family Medicine

## 2022-08-28 VITALS — BP 110/90 | HR 100 | Temp 98.2°F | Ht 67.0 in | Wt 210.8 lb

## 2022-08-28 DIAGNOSIS — J342 Deviated nasal septum: Secondary | ICD-10-CM

## 2022-08-28 DIAGNOSIS — F32A Depression, unspecified: Secondary | ICD-10-CM | POA: Diagnosis not present

## 2022-08-28 DIAGNOSIS — F419 Anxiety disorder, unspecified: Secondary | ICD-10-CM

## 2022-08-28 DIAGNOSIS — R42 Dizziness and giddiness: Secondary | ICD-10-CM | POA: Diagnosis not present

## 2022-08-28 DIAGNOSIS — J309 Allergic rhinitis, unspecified: Secondary | ICD-10-CM | POA: Diagnosis not present

## 2022-08-28 DIAGNOSIS — R519 Headache, unspecified: Secondary | ICD-10-CM | POA: Diagnosis not present

## 2022-08-28 NOTE — Patient Instructions (Signed)
  Bring your blood pressure monitor to your next office visit so that we can verify the accuracy/technique, to know whether your home readings are trustworthy or not.  Continue the flonase daily, as we discussed (gentle sniffs, 2 sprays into each nostril daily). Continue antihistamine (cetirizine). I encourage you to resume sinus rinses, and/or regular use of saline spray You had some wax in the ears, not blocking.  If you have decreased hearing or pain, you can return for an ear lavage.  You can try using Debrox drops to soften the wax.  Please talk to your psychiatrist regarding your stress and notes for work. Your headaches are partly muscular, likely related to your stress (and grinding, clenching, gum-chewing). Heat/ice can help as needed.  The lightheadedness can be related to dehydration and lower blood pressures related to your medication.  Hopefully those will improve as your dose of losartan was cut.  Be sure to stay well hydrated.

## 2022-08-29 ENCOUNTER — Encounter: Payer: Self-pay | Admitting: Gastroenterology

## 2022-08-29 ENCOUNTER — Telehealth: Payer: 59 | Admitting: Medical

## 2022-09-12 ENCOUNTER — Other Ambulatory Visit: Payer: Self-pay | Admitting: Medical

## 2022-09-14 ENCOUNTER — Other Ambulatory Visit: Payer: Self-pay | Admitting: Medical

## 2022-09-16 ENCOUNTER — Ambulatory Visit (INDEPENDENT_AMBULATORY_CARE_PROVIDER_SITE_OTHER): Payer: 59 | Admitting: Medical

## 2022-09-16 VITALS — BP 130/80 | HR 106 | Ht 67.0 in | Wt 208.2 lb

## 2022-09-16 DIAGNOSIS — L989 Disorder of the skin and subcutaneous tissue, unspecified: Secondary | ICD-10-CM

## 2022-09-16 DIAGNOSIS — N181 Chronic kidney disease, stage 1: Secondary | ICD-10-CM

## 2022-09-16 DIAGNOSIS — R809 Proteinuria, unspecified: Secondary | ICD-10-CM | POA: Diagnosis not present

## 2022-09-16 DIAGNOSIS — E1169 Type 2 diabetes mellitus with other specified complication: Secondary | ICD-10-CM

## 2022-09-16 DIAGNOSIS — M7989 Other specified soft tissue disorders: Secondary | ICD-10-CM | POA: Diagnosis not present

## 2022-09-16 DIAGNOSIS — T148XXA Other injury of unspecified body region, initial encounter: Secondary | ICD-10-CM

## 2022-09-16 DIAGNOSIS — Z8616 Personal history of COVID-19: Secondary | ICD-10-CM | POA: Diagnosis not present

## 2022-09-16 DIAGNOSIS — F419 Anxiety disorder, unspecified: Secondary | ICD-10-CM

## 2022-09-16 DIAGNOSIS — E785 Hyperlipidemia, unspecified: Secondary | ICD-10-CM

## 2022-09-16 DIAGNOSIS — F32A Depression, unspecified: Secondary | ICD-10-CM

## 2022-09-16 DIAGNOSIS — Z2820 Immunization not carried out because of patient decision for unspecified reason: Secondary | ICD-10-CM | POA: Insufficient documentation

## 2022-09-16 DIAGNOSIS — E1165 Type 2 diabetes mellitus with hyperglycemia: Secondary | ICD-10-CM

## 2022-09-16 LAB — RENAL FUNCTION PANEL

## 2022-09-16 LAB — CBC WITH DIFFERENTIAL/PLATELET
Hematocrit: 39.3 % (ref 34.0–46.6)
MCH: 25 pg — ABNORMAL LOW (ref 26.6–33.0)
MCHC: 33.3 g/dL (ref 31.5–35.7)
MCV: 75 fL — ABNORMAL LOW (ref 79–97)
Monocytes Absolute: 0.2 10*3/uL (ref 0.1–0.9)
RBC: 5.23 x10E6/uL (ref 3.77–5.28)

## 2022-09-16 LAB — LIPID PANEL

## 2022-09-16 LAB — HEPATIC FUNCTION PANEL

## 2022-09-16 LAB — SEDIMENTATION RATE

## 2022-09-16 NOTE — Addendum Note (Signed)
Addended by: Jac Canavan on: 09/16/2022 12:23 PM   Modules accepted: Orders

## 2022-09-16 NOTE — Progress Notes (Addendum)
Subjective:  [name] is a 62 y.o. female who presents for Chief Complaint  Patient presents with   Diabetes    Fasting med check. Has been bruising easily. She is waking up and has bruises in random places. Last Sept when she had covid she had right calf and it still has not gone away.      Here for fasting chronic issue follow up.  Patient Care Team: Helen Cuff, Kermit Balo, PA-C as PCP - General (Family Medicine) Estanislado Emms, MD as Consulting Physician (Nephrology) Verner Chol, [facility] as Pharmacist (Pharmacist) Sees dentist Dr. Claudette Head, GI Alanda Slim, optometry Dr. Manus Rudd, general surgery Dr. Willodean Rosenthal, og/gyn Dr. Vallery Sa, nephrology, [facility] Dr. Leone Payor, psychiatry  Diabetes-compliant with Ozempic 1 mg weekly.  Checks glucose some, not daily, but no in past few weeks.   Few weeks ago glucose was ok.    She does note polyuria and polydipsia.   Having some blurred vision.  No foot lesions.  On gabapentin for leg/feet neuropathy.  Hyperlipidemia-compliant with Crestor 20 mg daily, but lately forgetting aspirin 81 mg daily  CKD/Microalbuminuria-compliant with losartan 25 mg daily.  Just saw [facility] in May.  Her losartan dose was lowered due to some low BP readings.  Everything was stable per patient, advised once yearly f/u.  History of anxiety and depression-compliant with recently added Wellbutrin XL 150 mg daily,also on Effexor150 mg XR daily, clonazepam as needed. Sees psychiatry.  Having some bruising lately unexplained.  One was on right inner thigh above knee, one was left chest, one antecubital region in recent weeks.  Has been having calf pain since having covid in September 2023.  Hurts sometimes to fully stretch out the right calf compared to left.   No swelling though.  She notes skin lesion of left thigh for a few years slight bigger now.  No other aggravating or relieving factors.    No other  c/o.  Past Medical History:  Diagnosis Date   Abdominal pain    due to fibroids   Anxiety    Arthritis    hips and knees   Asthma    stress induced asthma   Chest pain 06/2019   Chlamydia    Chronic kidney disease    ckd stage 1 per 5-26-201 dr Lawson Fiscal [facility] note   Depression    DM type 2 (diabetes mellitus, type 2) (HCC) dx jan 2021   Fibroid    uterine   GERD (gastroesophageal reflux disease)    Hepatic steatosis 12/05/2019   Hyperlipidemia    IBS (irritable bowel syndrome)    Liver nodule 1999   no follow up needed was due to stress   Morbid obesity (HCC)    Neuropathy    both fingers and toes at times   Obesity    Protein in urine 03/2019   Trichomonas    Tubular adenoma of colon 01/2012   Current Outpatient Medications on File Prior to Visit  Medication Sig Dispense Refill   b complex vitamins capsule Take 1 capsule by mouth daily.     buPROPion (WELLBUTRIN XL) 150 MG 24 hr tablet Take 150 mg by mouth every morning.     Cholecalciferol (VITAMIN D) 50 MCG (2000 UT) CAPS Take 1 capsule (2,000 Units total) by mouth daily. 90 capsule 3   fluticasone (FLONASE) 50 MCG/ACT nasal spray SPRAY 2 SPRAYS INTO EACH NOSTRIL EVERY DAY 48 mL 0   gabapentin (NEURONTIN) 100 MG  capsule Take 100 mg by mouth 3 (three) times daily.     Glucose Blood (BLOOD GLUCOSE TEST STRIPS) STRP Test once a day. Pt has a onetouch verio flex meter dx e11.9 100 strip 1   Lancets (ONETOUCH ULTRASOFT) lancets Test once a day. Dx E11.9 pt has onetouch verio flex meter 100 each 1   losartan (COZAAR) 25 MG tablet TAKE 1 TABLET (25 MG TOTAL) BY MOUTH DAILY. 30 tablet 1   Melatonin 5 MG CAPS Take 5 mg by mouth at bedtime as needed (sleep).     omeprazole (PRILOSEC) 40 MG capsule TAKE 1 CAPSULE BY MOUTH TWICE A DAY 180 capsule 1   ondansetron (ZOFRAN) 4 MG tablet Take 1 tablet (4 mg total) by mouth every 8 (eight) hours as needed for nausea or vomiting. 30 tablet 0   rosuvastatin (CRESTOR) 20 MG tablet TAKE 1  TABLET BY MOUTH EVERY DAY 30 tablet 1   Semaglutide, 1 MG/DOSE, (OZEMPIC, 1 MG/DOSE,) 4 MG/3ML SOPN Inject 1 mg into the skin once a week. 3 mL 2   venlafaxine XR (EFFEXOR-XR) 150 MG 24 hr capsule Take 150 mg by mouth daily with breakfast.     albuterol (VENTOLIN HFA) 108 (90 Base) MCG/ACT inhaler Inhale 2 puffs into the lungs every 6 (six) hours as needed for wheezing. (Patient not taking: Reported on 08/28/2022) 18 each 2   aspirin 81 MG tablet Take 81 mg by mouth daily. (Patient not taking: Reported on 07/22/2022)     clonazePAM (KLONOPIN) 0.5 MG tablet Take 0.5 mg by mouth daily as needed. (Patient not taking: Reported on 09/16/2022)     diclofenac Sodium (VOLTAREN) 1 % GEL APPLY 4 G TOPICALLY 4 (FOUR) TIMES DAILY. (Patient not taking: Reported on 08/28/2022) 200 g 1   hyoscyamine (LEVBID) 0.375 MG 12 hr tablet TAKE 1 TABLET (0.375 MG TOTAL) BY MOUTH EVERY 12 (TWELVE) HOURS AS NEEDED. (Patient not taking: Reported on 08/28/2022) 180 tablet 1   meclizine (ANTIVERT) 25 MG tablet Take 25 mg by mouth 3 (three) times daily as needed for dizziness. (Patient not taking: Reported on 09/16/2022)     montelukast (SINGULAIR) 10 MG tablet Take 1 tablet (10 mg total) by mouth at bedtime. (Patient not taking: Reported on 09/16/2022) 90 tablet 3   phenylephrine (SUDAFED PE) 10 MG TABS tablet Take 10 mg by mouth every 4 (four) hours as needed. (Patient not taking: Reported on 09/16/2022)     zolpidem (AMBIEN) 10 MG tablet Take 10 mg by mouth at bedtime as needed for sleep. (Patient not taking: Reported on 08/28/2022)     No current facility-administered medications on file prior to visit.     The following portions of the patient's history were reviewed and updated as appropriate: allergies, current medications, past family history, past medical history, past social history, past surgical history and problem list.  ROS Otherwise as in subjective above     Objective: BP 130/80   Pulse (!) 106   Ht 5\' 7"  (1.702 m)    Wt 208 lb 3.2 oz (94.4 kg)   LMP 06/23/2014   SpO2 98%   BMI 32.61 kg/m   Wt Readings from Last 3 Encounters:  09/16/22 208 lb 3.2 oz (94.4 kg)  08/28/22 210 lb 12.8 oz (95.6 kg)  07/22/22 212 lb (96.2 kg)   BP Readings from Last 3 Encounters:  09/16/22 130/80  08/28/22 (!) 110/90  07/22/22 130/80   General appearance: alert, no distress, well developed, well nourished HEENT: normocephalic, sclerae  anicteric, conjunctiva pink and moist, TMs pearly, nares patent, no discharge or erythema, pharynx normal Oral cavity: MMM, no lesions Neck: supple, no lymphadenopathy, no thyromegaly, no masses, no bruits, no JVD Heart: Slightly tachycardic, otherwise RRR, normal S1, S2, no murmurs Lungs: CTA bilaterally, no wheezes, rhonchi, or rales Skin: left upper medial thigh with fleshy skin color lesion with approx 1.19mm diameter base and round fleshy pedunculated appendage.   Faint light purpleish bruising on the right medial thigh distal, otherwise no skin abnormality MSK: There is subtle asymmetry with greater diameter of the right Mid left calf but no palpable cord, negative Homans, no frank edema, otherwise legs nontender with normal range of motion Pulses: 2+ radial pulses, 2+ pedal pulses, normal cap refill Ext: no edema  Diabetic Foot Exam - Simple   Simple Foot Form Diabetic Foot exam was performed with the following findings: Yes 09/16/2022 10:37 AM  Visual Inspection No deformities, no ulcerations, no other skin breakdown bilaterally: Yes Sensation Testing See comments: Yes Pulse Check Posterior Tibialis and Dorsalis pulse intact bilaterally: Yes Comments Slightly decreased monofilament sensation of bilat great toes, otherwise normal strength and sensation      Assessment: Encounter Diagnoses  Name Primary?   Type 2 diabetes mellitus with hyperglycemia, without long-term current use of insulin (HCC) Yes   Hyperlipidemia associated with type 2 diabetes mellitus (HCC)     History of COVID-19    Bruising    CKD (chronic kidney disease) stage 1, GFR 90 ml/min or greater    Anxiety and depression    Microalbuminuria    Vaccine refused by patient    Right leg swelling    Skin lesion      Plan: Diabetes-updated labs today, continue Ozempic weekly injection, needs to be checking sugars more frequently, continue yearly eye doctor visit, routine follow-up.  Hyperlipidemia-continue statin and add back aspirin daily 81 mg.  Labs today  Bruising-additional labs as below.  Discussed possible causes  CKD-we will request most recent nephrology notes.  Updated labs as below.  Anxiety depression-managed by psychiatry  Counseled on vaccine recommendations including shingles vaccine but she declines today.  Right leg swelling, history of COVID-referral for ultrasound of lower extremity  Skin lesion - referral for excision  [name] was seen today for diabetes.  Diagnoses and all orders for this visit:  Type 2 diabetes mellitus with hyperglycemia, without long-term current use of insulin (HCC) -     Hemoglobin A1c -     Renal Function Panel -     Hepatic function panel  Hyperlipidemia associated with type 2 diabetes mellitus (HCC) -     Lipid panel  History of COVID-19 -     VAS [name] LOWER EXTREMITY VENOUS (DVT); Future  Bruising -     TSH -     Sedimentation rate -     Hepatic function panel -     ANA  CKD (chronic kidney disease) stage 1, GFR 90 ml/min or greater -     CBC with Differential/Platelet -     Renal Function Panel -     Hepatic function panel  Anxiety and depression  Microalbuminuria -     Renal Function Panel  Vaccine refused by patient  Right leg swelling -     VAS [name] LOWER EXTREMITY VENOUS (DVT); Future  Skin lesion -     Ambulatory referral to Plastic Surgery    Follow up: pending labs

## 2022-09-17 ENCOUNTER — Other Ambulatory Visit: Payer: Self-pay | Admitting: Medical

## 2022-09-17 LAB — ANA: Anti Nuclear Antibody (ANA): NEGATIVE

## 2022-09-17 LAB — CBC WITH DIFFERENTIAL/PLATELET
Basophils Absolute: 0 10*3/uL (ref 0.0–0.2)
Basos: 1 %
EOS (ABSOLUTE): 0.1 10*3/uL (ref 0.0–0.4)
Eos: 2 %
Hemoglobin: 13.1 g/dL (ref 11.1–15.9)
Immature Grans (Abs): 0 10*3/uL (ref 0.0–0.1)
Immature Granulocytes: 0 %
Lymphocytes Absolute: 2.4 10*3/uL (ref 0.7–3.1)
Lymphs: 50 %
Monocytes: 4 %
Neutrophils Absolute: 2.1 10*3/uL (ref 1.4–7.0)
Neutrophils: 43 %
Platelets: 218 10*3/uL (ref 150–450)
RDW: 13.1 % (ref 11.7–15.4)
WBC: 4.9 10*3/uL (ref 3.4–10.8)

## 2022-09-17 LAB — RENAL FUNCTION PANEL
BUN/Creatinine Ratio: 9 — ABNORMAL LOW (ref 12–28)
BUN: 7 mg/dL — ABNORMAL LOW (ref 8–27)
CO2: 21 mmol/L (ref 20–29)
Creatinine, Ser: 0.82 mg/dL (ref 0.57–1.00)
Phosphorus: 4.3 mg/dL (ref 3.0–4.3)
Potassium: 4.3 mmol/L (ref 3.5–5.2)
Sodium: 141 mmol/L (ref 134–144)
eGFR: 81 mL/min/{1.73_m2} (ref >59)

## 2022-09-17 LAB — LIPID PANEL
HDL: 61 mg/dL (ref >39)
LDL Chol Calc (NIH): 89 mg/dL (ref 0–99)
Triglycerides: 57 mg/dL (ref 0–149)

## 2022-09-17 LAB — HEPATIC FUNCTION PANEL
ALT: 11 IU/L (ref 0–32)
AST: 20 IU/L (ref 0–40)
Alkaline Phosphatase: 84 IU/L (ref 44–121)
Bilirubin Total: <0.2 mg/dL (ref 0.0–1.2)
Bilirubin, Direct: <0.1 mg/dL (ref 0.00–0.40)

## 2022-09-17 LAB — HEMOGLOBIN A1C
Est. average glucose Bld gHb Est-mCnc: 128 mg/dL
Hgb A1c MFr Bld: 6.1 % — ABNORMAL HIGH (ref 4.8–5.6)

## 2022-09-17 LAB — TSH: TSH: 1.69 u[IU]/mL (ref 0.450–4.500)

## 2022-09-17 NOTE — Progress Notes (Signed)
Results sent through MyChart

## 2022-09-18 NOTE — Progress Notes (Signed)
Results sent through MyChart

## 2022-09-22 HISTORY — PX: COLONOSCOPY: SHX174

## 2022-09-26 ENCOUNTER — Ambulatory Visit (AMBULATORY_SURGERY_CENTER): Payer: 59

## 2022-09-26 VITALS — Ht 67.0 in | Wt 209.0 lb

## 2022-09-26 DIAGNOSIS — Z86010 Personal history of colonic polyps: Secondary | ICD-10-CM

## 2022-09-26 DIAGNOSIS — G479 Sleep disorder, unspecified: Secondary | ICD-10-CM | POA: Insufficient documentation

## 2022-09-26 DIAGNOSIS — M26629 Arthralgia of temporomandibular joint, unspecified side: Secondary | ICD-10-CM | POA: Insufficient documentation

## 2022-09-26 MED ORDER — NA SULFATE-K SULFATE-MG SULF 17.5-3.13-1.6 GM/177ML PO SOLN
1.0000 | Freq: Once | ORAL | 0 refills | Status: AC
Start: 2022-09-26 — End: 2022-09-26

## 2022-09-26 NOTE — Progress Notes (Signed)
No egg or soy allergy known to patient  No issues known to pt with past sedation with any surgeries or procedures Patient denies ever being told they had issues or difficulty with intubation  No FH of Malignant Hyperthermia Pt is not on diet pills Pt is not on  home 02  Pt is not on blood thinners  Pt denies issues with constipation  No A fib or A flutter Have any cardiac testing pending--no  LOA: independent  Prep: suprep   Patient's chart reviewed by John Nulty CNRA prior to previsit and patient appropriate for the [facility].  Previsit completed and red dot placed by patient's name on their procedure day (on provider's schedule).     PV competed with patient. Prep instructions sent via mychart and home address. Goodrx coupon for [facility] provided to use for price reduction if needed.   

## 2022-09-28 ENCOUNTER — Other Ambulatory Visit: Payer: Self-pay | Admitting: Medical

## 2022-09-29 ENCOUNTER — Ambulatory Visit (HOSPITAL_COMMUNITY)
Admission: RE | Admit: 2022-09-29 | Discharge: 2022-09-29 | Disposition: A | Discharge status: Home / Self Care | Payer: 59 | Source: Ambulatory Visit | Attending: Medical | Admitting: Medical

## 2022-09-29 DIAGNOSIS — M7989 Other specified soft tissue disorders: Secondary | ICD-10-CM | POA: Insufficient documentation

## 2022-09-29 DIAGNOSIS — Z8616 Personal history of COVID-19: Secondary | ICD-10-CM | POA: Diagnosis not present

## 2022-09-29 NOTE — Progress Notes (Signed)
Results sent through MyChart

## 2022-10-14 ENCOUNTER — Ambulatory Visit (INDEPENDENT_AMBULATORY_CARE_PROVIDER_SITE_OTHER): Payer: 59 | Admitting: Plastic Surgery

## 2022-10-14 ENCOUNTER — Encounter: Payer: Self-pay | Admitting: Plastic Surgery

## 2022-10-14 VITALS — BP 127/81 | HR 109 | Ht 67.0 in | Wt 209.8 lb

## 2022-10-14 DIAGNOSIS — R2242 Localized swelling, mass and lump, left lower limb: Secondary | ICD-10-CM | POA: Insufficient documentation

## 2022-10-14 DIAGNOSIS — F319 Bipolar disorder, unspecified: Secondary | ICD-10-CM

## 2022-10-14 NOTE — Progress Notes (Signed)
Patient ID: [name], female    DOB: Feb 03, 1961, 62 y.o.   MRN: [id number]   Chief Complaint  Patient presents with   Consult    The patient is a 62 year old female here for evaluation of a mass on her left inner thigh.  Patient says it has been there for a number of years.  It seems to be getting larger.  It does not hurt.  She does not have any similar lesions.  Her past medical history does include diabetes and the rest as listed below.  She has not had this operated on before.  It is approximately 1 x 3 cm pedunculated flesh-colored lesion that looks like a lipoma.    Review of Systems  Constitutional: Negative.   HENT: Negative.    Eyes: Negative.   Respiratory: Negative.    Cardiovascular: Negative.   Gastrointestinal: Negative.   Endocrine: Negative.   Genitourinary: Negative.   Musculoskeletal: Negative.     Past Medical History:  Diagnosis Date   Abdominal pain    due to fibroids   Anxiety    Arthritis    hips and knees   Asthma    stress induced asthma   Chest pain 06/2019   Chlamydia    Chronic kidney disease    ckd stage 1 per 5-26-201 dr Lawson Fiscal [facility] note   Depression    DM type 2 (diabetes mellitus, type 2) (HCC) dx jan 2021   Fibroid    uterine   GERD (gastroesophageal reflux disease)    Hepatic steatosis 12/05/2019   Hyperlipidemia    IBS (irritable bowel syndrome)    Liver nodule 1999   no follow up needed was due to stress   Morbid obesity (HCC)    Neuropathy    both fingers and toes at times   Obesity    Protein in urine 03/2019   Trichomonas    Tubular adenoma of colon 01/2012    Past Surgical History:  Procedure Laterality Date   colonscopy  2017   COLPOSCOPY  1990   results were normal   CYSTOSCOPY  02/28/2020   Procedure: CYSTOSCOPY;  Surgeon: Willodean Rosenthal, MD;  Location: [facility];  Service: Gynecology;;   DILITATION & CURRETTAGE/HYSTROSCOPY WITH NOVASURE ABLATION N/A 08/23/2019    Procedure: DILATATION & CURETTAGE/DIAGNOSTIC HYSTEROSCOPY;  Surgeon: Willodean Rosenthal, MD;  Location: [facility];  Service: Gynecology;  Laterality: N/A;   IR GENERIC HISTORICAL  03/21/2016   IR EMBO TUMOR ORGAN ISCHEMIA INFARCT INC GUIDE ROADMAPPING 03/21/2016 Oley Balm, MD [facility]   IR GENERIC HISTORICAL  03/21/2016   IR US GUIDE VASC ACCESS RIGHT 03/21/2016 Oley Balm, MD [facility]   IR GENERIC HISTORICAL  03/21/2016   IR ANGIOGRAM SELECTIVE EACH ADDITIONAL VESSEL 03/21/2016 Oley Balm, MD [facility]   IR GENERIC HISTORICAL  03/21/2016   IR ANGIOGRAM SELECTIVE EACH ADDITIONAL VESSEL 03/21/2016 Oley Balm, MD [facility]   IR GENERIC HISTORICAL  03/21/2016   IR ANGIOGRAM PELVIS SELECTIVE OR SUPRASELECTIVE 03/21/2016 Oley Balm, MD [facility]   IR GENERIC HISTORICAL  03/21/2016   IR ANGIOGRAM PELVIS SELECTIVE OR SUPRASELECTIVE 03/21/2016 Oley Balm, MD [facility]   IR GENERIC HISTORICAL  02/27/2016   IR RADIOLOGIST EVAL & MGMT 02/27/2016 Oley Balm, MD [facility]   IR GENERIC HISTORICAL  04/03/2016   IR RADIOLOGIST EVAL & MGMT 04/03/2016 Oley Balm, MD [facility]   IR RADIOLOGIST EVAL & MGMT  08/26/2016  labial cyst removal     ROBOTIC ASSISTED TOTAL HYSTERECTOMY Bilateral 02/28/2020   Procedure: XI ROBOTIC ASSISTED TOTAL HYSTERECTOMY AND BILATERAL SALPINGECTOMY;  Surgeon: Willodean Rosenthal, MD;  Location: [facility];  Service: Gynecology;  Laterality: Bilateral;   TUBAL LIGATION     20 years ago   UPPER GASTROINTESTINAL ENDOSCOPY        Current Outpatient Medications:    albuterol (VENTOLIN HFA) 108 (90 Base) MCG/ACT inhaler, Inhale 2 puffs into the lungs every 6 (six) hours as needed for wheezing., Disp: 18 each, Rfl: 2   aspirin 81 MG tablet, Take 81 mg by mouth daily., Disp: , Rfl:    b complex vitamins capsule, Take 1 capsule by mouth daily., Disp: , Rfl:    buPROPion  (WELLBUTRIN XL) 150 MG 24 hr tablet, Take 150 mg by mouth every morning., Disp: , Rfl:    Cholecalciferol (VITAMIN D) 50 MCG (2000 UT) CAPS, Take 1 capsule (2,000 Units total) by mouth daily., Disp: 90 capsule, Rfl: 3   clonazePAM (KLONOPIN) 0.5 MG tablet, Take 0.5 mg by mouth daily as needed., Disp: , Rfl:    diclofenac Sodium (VOLTAREN) 1 % GEL, APPLY 4 G TOPICALLY 4 (FOUR) TIMES DAILY., Disp: 200 g, Rfl: 1   fluticasone (FLONASE) 50 MCG/ACT nasal spray, SPRAY 2 SPRAYS INTO EACH NOSTRIL EVERY DAY, Disp: 48 mL, Rfl: 0   gabapentin (NEURONTIN) 100 MG capsule, Take 100 mg by mouth 3 (three) times daily., Disp: , Rfl:    Glucose Blood (BLOOD GLUCOSE TEST STRIPS) STRP, Test once a day. Pt has a onetouch verio flex meter dx e11.9, Disp: 100 strip, Rfl: 1   hyoscyamine (LEVBID) 0.375 MG 12 hr tablet, TAKE 1 TABLET (0.375 MG TOTAL) BY MOUTH EVERY 12 (TWELVE) HOURS AS NEEDED., Disp: 180 tablet, Rfl: 1   Lancets (ONETOUCH ULTRASOFT) lancets, Test once a day. Dx E11.9 pt has onetouch verio flex meter, Disp: 100 each, Rfl: 1   losartan (COZAAR) 25 MG tablet, TAKE 1 TABLET (25 MG TOTAL) BY MOUTH DAILY., Disp: 30 tablet, Rfl: 1   meclizine (ANTIVERT) 25 MG tablet, Take 25 mg by mouth 3 (three) times daily as needed for dizziness., Disp: , Rfl:    Melatonin 5 MG CAPS, Take 5 mg by mouth at bedtime as needed (sleep)., Disp: , Rfl:    montelukast (SINGULAIR) 10 MG tablet, Take 1 tablet (10 mg total) by mouth at bedtime., Disp: 90 tablet, Rfl: 3   omeprazole (PRILOSEC) 40 MG capsule, TAKE 1 CAPSULE (40 MG TOTAL) BY MOUTH DAILY., Disp: 90 capsule, Rfl: 1   ondansetron (ZOFRAN) 4 MG tablet, Take 1 tablet (4 mg total) by mouth every 8 (eight) hours as needed for nausea or vomiting., Disp: 30 tablet, Rfl: 0   phenylephrine (SUDAFED PE) 10 MG TABS tablet, Take 10 mg by mouth every 4 (four) hours as needed., Disp: , Rfl:    rosuvastatin (CRESTOR) 20 MG tablet, TAKE 1 TABLET BY MOUTH EVERY DAY, Disp: 90 tablet, Rfl: 0    Semaglutide, 1 MG/DOSE, (OZEMPIC, 1 MG/DOSE,) 4 MG/3ML SOPN, Inject 1 mg into the skin once a week., Disp: 3 mL, Rfl: 2   venlafaxine XR (EFFEXOR-XR) 150 MG 24 hr capsule, Take 150 mg by mouth daily with breakfast., Disp: , Rfl:    venlafaxine XR (EFFEXOR-XR) 75 MG 24 hr capsule, Take 75 mg by mouth every evening., Disp: , Rfl:    zolpidem (AMBIEN) 10 MG tablet, Take 10 mg by mouth at bedtime as needed  for sleep., Disp: , Rfl:    Lancets MISC, Test once a day. Dx E11.9 pt has onetouch verio flex meter, Disp: , Rfl:    Objective:   Vitals:   10/14/22 1431  BP: 127/81  Pulse: (!) 109  SpO2: 97%    Physical Exam Vitals and nursing note reviewed.  Constitutional:      Appearance: Normal appearance.  HENT:     Head: Normocephalic and atraumatic.  Cardiovascular:     Rate and Rhythm: Normal rate.     Pulses: Normal pulses.  Pulmonary:     Effort: Pulmonary effort is normal.  Neurological:     Mental Status: She is alert and oriented to person, place, and time.  Psychiatric:        Mood and Affect: Mood normal.        Behavior: Behavior normal.        Thought Content: Thought content normal.        Judgment: Judgment normal.     Assessment & Plan:  Bipolar affective disorder, remission status unspecified (HCC)  Mass of thigh, left  Plan for excision of mass.  She will have a scar that was explained to her.  We will send this to pathology.  Pictures were obtained of the patient and placed in the chart with the patient's or guardian's permission.   Alena Bills Kortland Nichols, DO

## 2022-10-17 ENCOUNTER — Other Ambulatory Visit: Payer: Self-pay | Admitting: Medical

## 2022-10-20 ENCOUNTER — Encounter: Payer: Self-pay | Admitting: Gastroenterology

## 2022-10-20 ENCOUNTER — Ambulatory Visit (AMBULATORY_SURGERY_CENTER): Payer: 59 | Admitting: Gastroenterology

## 2022-10-20 VITALS — BP 134/95 | HR 75 | Temp 97.5°F | Resp 14 | Ht 67.0 in | Wt 209.0 lb

## 2022-10-20 DIAGNOSIS — D123 Benign neoplasm of transverse colon: Secondary | ICD-10-CM | POA: Diagnosis not present

## 2022-10-20 DIAGNOSIS — D122 Benign neoplasm of ascending colon: Secondary | ICD-10-CM

## 2022-10-20 DIAGNOSIS — E119 Type 2 diabetes mellitus without complications: Secondary | ICD-10-CM | POA: Diagnosis not present

## 2022-10-20 DIAGNOSIS — Z86010 Personal history of colonic polyps: Secondary | ICD-10-CM

## 2022-10-20 DIAGNOSIS — F32A Depression, unspecified: Secondary | ICD-10-CM | POA: Diagnosis not present

## 2022-10-20 DIAGNOSIS — D124 Benign neoplasm of descending colon: Secondary | ICD-10-CM | POA: Diagnosis not present

## 2022-10-20 DIAGNOSIS — Z09 Encounter for follow-up examination after completed treatment for conditions other than malignant neoplasm: Secondary | ICD-10-CM | POA: Diagnosis not present

## 2022-10-20 MED ORDER — SODIUM CHLORIDE 0.9 % IV SOLN
500.0000 mL | Freq: Once | INTRAVENOUS | Status: DC
Start: 2022-10-20 — End: 2022-10-20

## 2022-10-20 NOTE — Progress Notes (Signed)
Vss nad trans to pacu 

## 2022-10-20 NOTE — Op Note (Signed)
[facility] Patient Name: [name] Procedure Date: 10/20/2022 11:49 AM MRN: [id number] Endoscopist: Meryl Dare , MD, [phone] Age: 62 Referring MD:  Date of Birth: June 28, 1960 Gender: Female Account #: [id number] Procedure:                Colonoscopy Indications:              Surveillance: Personal history of adenomatous                            polyps on last colonoscopy 5 years ago Medicines:                Monitored Anesthesia Care Procedure:                Pre-Anesthesia Assessment:                           - Prior to the procedure, a History and Physical                            was performed, and patient medications and                            allergies were reviewed. The patient's tolerance of                            previous anesthesia was also reviewed. The risks                            and benefits of the procedure and the sedation                            options and risks were discussed with the patient.                            All questions were answered, and informed consent                            was obtained. Prior Anticoagulants: The patient has                            taken no anticoagulant or antiplatelet agents. ASA                            Grade Assessment: III - A patient with severe                            systemic disease. After reviewing the risks and                            benefits, the patient was deemed in satisfactory                            condition to undergo the procedure.  After obtaining informed consent, the colonoscope                            was passed under direct vision. Throughout the                            procedure, the patient's blood pressure, pulse, and                            oxygen saturations were monitored continuously. The                            Olympus Scope [id number] was introduced through the                            anus and  advanced to the the cecum, identified by                            appendiceal orifice and ileocecal valve. The                            ileocecal valve, appendiceal orifice, and rectum                            were photographed. The quality of the bowel                            preparation was adequate after significant lavage,                            suction. The colonoscopy was performed without                            difficulty. The patient tolerated the procedure                            well. Scope In: 11:55:10 AM Scope Out: 12:11:27 PM Scope Withdrawal Time: 0 hours 13 minutes 43 seconds  Total Procedure Duration: 0 hours 16 minutes 17 seconds  Findings:                 The perianal and digital rectal examinations were                            normal.                           Four sessile polyps were found in the descending                            colon, transverse colon and ascending colon. The                            polyps were 4 to 7 mm in size. These polyps were  removed with a cold snare. Resection and retrieval                            were complete.                           A few small-mouthed diverticula were found in the                            left colon. There was no evidence of diverticular                            bleeding.                           External hemorrhoids were found during                            retroflexion. The hemorrhoids were small.                           The exam was otherwise without abnormality on                            direct and retroflexion views. Complications:            No immediate complications. Estimated blood loss:                            None. Estimated Blood Loss:     Estimated blood loss: none. Impression:               - Four 4 to 7 mm polyps in the descending colon, in                            the transverse colon and in the ascending colon,                             removed with a cold snare. Resected and retrieved.                           - Mild left colon diverticulosis.                           - External hemorrhoids.                           - The examination was otherwise normal on direct                            and retroflexion views. Recommendation:           - Repeat colonoscopy after studies are complete for                            surveillance based on pathology results with a more  extensive bowel prep.                           - Patient has a contact number available for                            emergencies. The signs and symptoms of potential                            delayed complications were discussed with the                            patient. Return to normal activities tomorrow.                            Written discharge instructions were provided to the                            patient.                           - High fiber diet.                           - Continue present medications.                           - Await pathology results. Meryl Dare, MD 10/20/2022 12:16:43 PM This report has been signed electronically.

## 2022-10-20 NOTE — Progress Notes (Signed)
Called to room to assist during endoscopic procedure.  Patient ID and intended procedure confirmed with present staff. Received instructions for my participation in the procedure from the performing physician.  

## 2022-10-20 NOTE — Patient Instructions (Addendum)
4 polyps removed and sent to pathology Resume previous medications.  Await results for final recommendations.   Handouts on findings given to patient.  (Diverticulosis, hemorrhoids and polyps)                           - Repeat colonoscopy after studies are complete for                            surveillance based on pathology results with a more                            extensive bowel prep.                           - Patient has a contact number available for                            emergencies. The signs and symptoms of potential                            delayed complications were discussed with the                            patient. Return to normal activities tomorrow.                            Written discharge instructions were provided to the                            patient.                           - High fiber diet.                           YOU HAD AN ENDOSCOPIC PROCEDURE TODAY AT THE [facility]:   Refer to the procedure report that was given to you for any specific questions about what was found during the examination.  If the procedure report does not answer your questions, please call your gastroenterologist to clarify.  If you requested that your care partner not be given the details of your procedure findings, then the procedure report has been included in a sealed envelope for you to review at your convenience later.  YOU SHOULD EXPECT: Some feelings of bloating in the abdomen. Passage of more gas than usual.  Walking can help get rid of the air that was put into your GI tract during the procedure and reduce the bloating. If you had a lower endoscopy (such as a colonoscopy or flexible sigmoidoscopy) you may notice spotting of blood in your stool or on the toilet paper. If you underwent a bowel prep for your procedure, you may not have a normal bowel movement for a few days.  Please Note:  You might notice some irritation and congestion in your nose or  some drainage.  This is from the oxygen used during your procedure.  There is no need for concern and it should clear up in a day or so.  SYMPTOMS TO REPORT IMMEDIATELY:  Following lower endoscopy (colonoscopy or flexible sigmoidoscopy):  Excessive amounts of blood in the stool  Significant tenderness or worsening of abdominal pains  Swelling of the abdomen that is new, acute  Fever of 100F or higher   For urgent or emergent issues, a gastroenterologist can be reached at any hour by calling [phone]. Do not use MyChart messaging for urgent concerns.    DIET:  We do recommend a small meal at first, but then you may proceed to your regular diet.  Drink plenty of fluids but you should avoid alcoholic beverages for 24 hours.  ACTIVITY:  You should plan to take it easy for the rest of today and you should NOT DRIVE or use heavy machinery until tomorrow (because of the sedation medicines used during the test).    FOLLOW UP: Our staff will call the number listed on your records the next business day following your procedure.  We will call around 7:15- 8:00 am to check on you and address any questions or concerns that you may have regarding the information given to you following your procedure. If we do not reach you, we will leave a message.     If any biopsies were taken you will be contacted by phone or by letter within the next 1-3 weeks.  Please call us at [phone] if you have not heard about the biopsies in 3 weeks.    SIGNATURES/CONFIDENTIALITY: You and/or your care partner have signed paperwork which will be entered into your electronic medical record.  These signatures attest to the fact that that the information above on your After Visit Summary has been reviewed and is understood.  Full responsibility of the confidentiality of this discharge information lies with you and/or your care-partner.
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
## 2022-10-21 ENCOUNTER — Other Ambulatory Visit: Payer: Self-pay | Admitting: Medical

## 2022-10-21 ENCOUNTER — Telehealth: Payer: Self-pay

## 2022-10-21 NOTE — Telephone Encounter (Signed)
Post procedure follow up call, no answer 

## 2022-10-31 ENCOUNTER — Encounter: Payer: Self-pay | Admitting: Gastroenterology

## 2022-11-03 ENCOUNTER — Other Ambulatory Visit: Payer: Self-pay | Admitting: Medical

## 2022-11-03 NOTE — Telephone Encounter (Signed)
Will discuss at her upcoming appt next week

## 2022-11-10 ENCOUNTER — Telehealth: Payer: Self-pay | Admitting: Medical

## 2022-11-10 ENCOUNTER — Other Ambulatory Visit: Payer: Self-pay | Admitting: Medical

## 2022-11-10 MED ORDER — MECLIZINE HCL 25 MG PO TABS: 25.0000 mg | ORAL_TABLET | Freq: Two times a day (BID) | ORAL | 1 refills | Status: AC | PRN

## 2022-11-10 NOTE — Telephone Encounter (Signed)
Pt left voicemail that she needs refill on meclizine

## 2022-11-10 NOTE — Telephone Encounter (Signed)
Is this okay to refill? 

## 2022-11-12 ENCOUNTER — Encounter: Payer: Self-pay | Admitting: Medical

## 2022-11-12 ENCOUNTER — Ambulatory Visit (INDEPENDENT_AMBULATORY_CARE_PROVIDER_SITE_OTHER): Payer: 59 | Admitting: Medical

## 2022-11-12 VITALS — BP 124/78 | HR 98 | Ht 67.5 in | Wt 215.6 lb

## 2022-11-12 DIAGNOSIS — Z7185 Encounter for immunization safety counseling: Secondary | ICD-10-CM

## 2022-11-12 DIAGNOSIS — F419 Anxiety disorder, unspecified: Secondary | ICD-10-CM

## 2022-11-12 DIAGNOSIS — E1165 Type 2 diabetes mellitus with hyperglycemia: Secondary | ICD-10-CM

## 2022-11-12 DIAGNOSIS — R110 Nausea: Secondary | ICD-10-CM

## 2022-11-12 DIAGNOSIS — E785 Hyperlipidemia, unspecified: Secondary | ICD-10-CM | POA: Diagnosis not present

## 2022-11-12 DIAGNOSIS — K760 Fatty (change of) liver, not elsewhere classified: Secondary | ICD-10-CM

## 2022-11-12 DIAGNOSIS — K580 Irritable bowel syndrome with diarrhea: Secondary | ICD-10-CM | POA: Diagnosis not present

## 2022-11-12 DIAGNOSIS — J4520 Mild intermittent asthma, uncomplicated: Secondary | ICD-10-CM | POA: Diagnosis not present

## 2022-11-12 DIAGNOSIS — E1169 Type 2 diabetes mellitus with other specified complication: Secondary | ICD-10-CM

## 2022-11-12 DIAGNOSIS — Z1231 Encounter for screening mammogram for malignant neoplasm of breast: Secondary | ICD-10-CM

## 2022-11-12 DIAGNOSIS — E559 Vitamin D deficiency, unspecified: Secondary | ICD-10-CM

## 2022-11-12 DIAGNOSIS — N181 Chronic kidney disease, stage 1: Secondary | ICD-10-CM

## 2022-11-12 DIAGNOSIS — G4700 Insomnia, unspecified: Secondary | ICD-10-CM

## 2022-11-12 DIAGNOSIS — R911 Solitary pulmonary nodule: Secondary | ICD-10-CM | POA: Diagnosis not present

## 2022-11-12 DIAGNOSIS — F32A Depression, unspecified: Secondary | ICD-10-CM

## 2022-11-12 NOTE — Progress Notes (Signed)
Subjective:   HPI  [name] is a 62 y.o. female who presents for Chief Complaint  Patient presents with   Annual Exam    Nonfasting cpe, had coffee with sugar in it    Patient Care Team: Siani Utke, Kermit Balo, PA-C as PCP - General (Family Medicine) Estanislado Emms, MD as Consulting Physician (Nephrology) Verner Chol, [facility] (Inactive) as Pharmacist (Pharmacist) Sees dentist Dr. Claudette Head, GI Alanda Slim, optometry Dr. Manus Rudd, general surgery Dr. Willodean Rosenthal, og/gyn Dr. Vallery Sa, nephrology, [facility] Dr. Leone Payor, psychiatry   Concerns: Here for well visit today.  Diabetes-she has been on several different medicines before and most recently was on Mounjaro and Ozempic to help with sugar control and weight loss.  Mounjaro did not seem to be helping plus the needle hurt.  Ozempic is helping her weight more but she is having gastric side effects including cramping and constipation and nausea.  She is considering stopping Ozempic  She just had a colonoscopy recently with Dr. Russella Dar  She has questions about cholesterol.  She had recent labs screening at a different clinic including lipid profile.  Her APO was high and she has questions about this.  She does not have the lab results with her.  Been using magnesium supplement over-the-counter, this seems to be helping joint pain.  She does not use it every day but intermittently  Status post hysterectomy  Compliant with other medicines as usual.  Sees psychiatry regularly.   Reviewed their medical, surgical, family, social, medication, and allergy history and updated chart as appropriate.  Past Medical History:  Diagnosis Date   Anxiety    Arthritis    hips and knees   Asthma    stress induced asthma   Chest pain 06/2019   Chronic kidney disease    ckd stage 1 per [id number] dr Lawson Fiscal [facility] note   Depression    DM type 2 (diabetes mellitus, type 2) (HCC) dx jan 2021    Fibroid    uterine   GERD (gastroesophageal reflux disease)    Hepatic steatosis 12/05/2019   Hyperlipidemia    IBS (irritable bowel syndrome)    Liver nodule 1999   no follow up needed was due to stress   Morbid obesity (HCC)    Neuropathy    both fingers and toes at times   Obesity    Protein in urine 03/2019   Tubular adenoma of colon 01/2012    Family History  Problem Relation Age of Onset   Diabetes Father    Diabetes Mother    Hypertension Mother    Deep vein thrombosis Mother    Cancer - Other Mother        peritoneal cancer   Hiatal hernia Mother    Colon cancer Maternal Grandmother    Colon polyps Maternal Grandmother    Hypertension Sister    Deep vein thrombosis Sister    Diabetes Sister    Hiatal hernia Sister    Rectal cancer Neg Hx    Stomach cancer Neg Hx    Pancreatic cancer Neg Hx    Kidney disease Neg Hx    Liver disease Neg Hx      Current Outpatient Medications:    albuterol (VENTOLIN HFA) 108 (90 Base) MCG/ACT inhaler, Inhale 2 puffs into the lungs every 6 (six) hours as needed for wheezing., Disp: 18 each, Rfl: 2   aspirin 81 MG tablet, Take 81 mg by mouth daily., Disp: ,  Rfl:    b complex vitamins capsule, Take 1 capsule by mouth daily., Disp: , Rfl:    buPROPion (WELLBUTRIN XL) 150 MG 24 hr tablet, Take 150 mg by mouth every morning., Disp: , Rfl:    Cholecalciferol (VITAMIN D) 50 MCG (2000 UT) CAPS, Take 1 capsule (2,000 Units total) by mouth daily., Disp: 90 capsule, Rfl: 3   clonazePAM (KLONOPIN) 0.5 MG tablet, Take 0.5 mg by mouth daily as needed., Disp: , Rfl:    diclofenac Sodium (VOLTAREN) 1 % GEL, APPLY 4 G TOPICALLY 4 (FOUR) TIMES DAILY., Disp: 200 g, Rfl: 1   fluticasone (FLONASE) 50 MCG/ACT nasal spray, SPRAY 2 SPRAYS INTO EACH NOSTRIL EVERY DAY, Disp: 48 mL, Rfl: 0   gabapentin (NEURONTIN) 100 MG capsule, Take 100 mg by mouth 3 (three) times daily., Disp: , Rfl:    hyoscyamine (LEVBID) 0.375 MG 12 hr tablet, TAKE 1 TABLET (0.375 MG  TOTAL) BY MOUTH EVERY 12 (TWELVE) HOURS AS NEEDED., Disp: 180 tablet, Rfl: 1   losartan (COZAAR) 25 MG tablet, TAKE 1 TABLET (25 MG TOTAL) BY MOUTH DAILY., Disp: 90 tablet, Rfl: 1   magnesium gluconate (MAGONATE) 500 MG tablet, Take 500 mg by mouth 2 (two) times daily., Disp: , Rfl:    meclizine (ANTIVERT) 25 MG tablet, Take 1 tablet (25 mg total) by mouth 2 (two) times daily as needed for dizziness., Disp: 30 tablet, Rfl: 1   Melatonin 5 MG CAPS, Take 5 mg by mouth at bedtime as needed (sleep)., Disp: , Rfl:    montelukast (SINGULAIR) 10 MG tablet, Take 1 tablet (10 mg total) by mouth at bedtime., Disp: 90 tablet, Rfl: 3   omeprazole (PRILOSEC) 40 MG capsule, TAKE 1 CAPSULE (40 MG TOTAL) BY MOUTH DAILY., Disp: 90 capsule, Rfl: 1   ondansetron (ZOFRAN) 4 MG tablet, Take 1 tablet (4 mg total) by mouth every 8 (eight) hours as needed for nausea or vomiting., Disp: 30 tablet, Rfl: 0   phenylephrine (SUDAFED PE) 10 MG TABS tablet, Take 10 mg by mouth every 4 (four) hours as needed., Disp: , Rfl:    rosuvastatin (CRESTOR) 20 MG tablet, TAKE 1 TABLET BY MOUTH EVERY DAY, Disp: 90 tablet, Rfl: 0   Semaglutide, 1 MG/DOSE, (OZEMPIC, 1 MG/DOSE,) 4 MG/3ML SOPN, INJECT 1MG  INTO THE SKIN ONCE A WEEK, Disp: 3 mL, Rfl: 2   venlafaxine XR (EFFEXOR-XR) 75 MG 24 hr capsule, Take 75 mg by mouth in the morning and at bedtime., Disp: , Rfl:    zolpidem (AMBIEN) 10 MG tablet, Take 10 mg by mouth at bedtime as needed for sleep., Disp: , Rfl:    Glucose Blood (BLOOD GLUCOSE TEST STRIPS) STRP, Test once a day. Pt has a onetouch verio flex meter dx e11.9, Disp: 100 strip, Rfl: 1   Lancets (ONETOUCH ULTRASOFT) lancets, Test once a day. Dx E11.9 pt has onetouch verio flex meter, Disp: 100 each, Rfl: 1   Lancets MISC, Test once a day. Dx E11.9 pt has onetouch verio flex meter, Disp: , Rfl:   Allergies  Allergen Reactions   Percocet [Oxycodone-Acetaminophen] Itching   Lamictal [Lamotrigine] Rash   Review of Systems   Constitutional:  Negative for chills, fever, malaise/fatigue and weight loss.  HENT:  Negative for congestion, ear pain, hearing loss, sore throat and tinnitus.   Eyes:  Negative for blurred vision, pain and redness.  Respiratory:  Negative for cough, hemoptysis and shortness of breath.   Cardiovascular:  Negative for chest pain, palpitations, orthopnea, claudication and  leg swelling.  Gastrointestinal:  Positive for abdominal pain. Negative for blood in stool, constipation, diarrhea, nausea and vomiting.  Genitourinary:  Negative for dysuria, flank pain, frequency, hematuria and urgency.  Musculoskeletal:  Negative for falls, joint pain and myalgias.  Skin:  Negative for itching and rash.  Neurological:  Negative for dizziness, tingling, speech change, weakness and headaches.  Endo/Heme/Allergies:  Negative for polydipsia. Does not bruise/bleed easily.  Psychiatric/Behavioral:  Negative for depression and memory loss. The patient is not nervous/anxious and does not have insomnia.          07/22/2022   12:14 PM 01/03/2022   10:14 AM 10/23/2021    4:01 PM 07/12/2021   11:57 AM 06/19/2021   11:05 AM  Depression screen PHQ 2/9  Decreased Interest 0 3 0 0 0  Down, Depressed, Hopeless 0 2 0 0 0  PHQ - 2 Score 0 5 0 0 0  Altered sleeping 3 2  3    Tired, decreased energy 0 3  1   Change in appetite 0 1  0   Feeling bad or failure about yourself  0 1  0   Trouble concentrating 0 3  0   Moving slowly or fidgety/restless 0 0  0   Suicidal thoughts 0 0  0   PHQ-9 Score 3 15  4    Difficult doing work/chores Not difficult at all Very difficult  Not difficult at all        Objective:  BP 124/78   Pulse 98   Ht 5' 7.5" (1.715 m)   Wt 215 lb 9.6 oz (97.8 kg)   LMP 06/23/2014   BMI 33.27 kg/m   Wt Readings from Last 3 Encounters:  11/12/22 215 lb 9.6 oz (97.8 kg)  10/20/22 209 lb (94.8 kg)  10/14/22 209 lb 12.8 oz (95.2 kg)    General appearance: alert, no distress, WD/WN, African  American female Skin: unremarkable HEENT: normocephalic, conjunctiva/corneas normal, sclerae anicteric, PERRLA, EOMi, nares patent, no discharge or erythema, pharynx normal Oral cavity: MMM, tongue normal, teeth in good repair Neck: supple, no lymphadenopathy, no thyromegaly, no masses, normal ROM, no bruits Chest: non tender, normal shape and expansion Heart: RRR, normal S1, S2, no murmurs Lungs: CTA bilaterally, no wheezes, rhonchi, or rales Abdomen: +bs, soft, mild generalized tendnerss, otherwise non distended, no masses, no hepatomegaly, no splenomegaly, no bruits Back: non tender, normal ROM, no scoliosis Musculoskeletal: upper extremities non tender, no obvious deformity, normal ROM throughout, lower extremities non tender, no obvious deformity, normal ROM throughout Extremities: no edema, no cyanosis, no clubbing Pulses: 2+ symmetric, upper and lower extremities, normal cap refill Neurological: alert, oriented x 3, CN2-12 intact, strength normal upper extremities and lower extremities, sensation normal throughout, DTRs 2+ throughout, no cerebellar signs, gait normal Psychiatric: normal affect, behavior normal, pleasant  Breast/gyn/rectal - deferred to gynecology     Assessment and Plan :   Encounter Diagnoses  Name Primary?   Pulmonary nodule Yes   Encounter for screening mammogram for malignant neoplasm of breast    CKD (chronic kidney disease) stage 1, GFR 90 ml/min or greater    Hyperlipidemia associated with type 2 diabetes mellitus (HCC)    Vitamin D deficiency    Vaccine counseling    Type 2 diabetes mellitus with hyperglycemia, without long-term current use of insulin (HCC)    Mild intermittent asthma without complication    Irritable bowel syndrome with diarrhea    Insomnia, unspecified type    Hepatic steatosis  Chronic nausea    Anxiety and depression      This visit was a preventative care visit, also known as wellness visit or routine physical.   Topics  typically include healthy lifestyle, diet, exercise, preventative care, vaccinations, sick and well care, proper use of emergency dept and after hours care, as well as other concerns.     Recommendations: Continue to return yearly for your annual wellness and preventative care visits.  This gives [name] a chance to discuss healthy lifestyle, exercise, vaccinations, review your chart record, and perform screenings where appropriate.  I recommend you see your eye doctor yearly for routine vision care.  I recommend you see your dentist yearly for routine dental care including hygiene visits twice yearly.  See your gynecologist yearly for routine gynecological care.    Vaccination recommendations were reviewed Immunization History  Administered Date(s) Administered   Hepb-cpg 07/24/2021, 10/23/2021   Influenza Whole 02/06/2009   Influenza,inj,Quad PF,6+ Mos 01/28/2017, 02/02/2018   PFIZER(Purple Top)SARS-COV-2 Vaccination 07/07/2019, 07/28/2019   Td 08/01/2005   Tdap 07/29/2021    I recommend you see pharmacy soon to get your tetanus vaccine updated.  Also recommend the shingles vaccine through your pharmacy  You can get your pneumococcal 20 vaccine here.  Let me know if you want to do this.   Screening for cancer: Colon cancer screening: I reviewed your colonoscopy on file that is up to date from 2019.  Recent colonoscopy from July 2024 reviewed, polyps present, repeat in 3 years.  Breast cancer screening: You should perform a self breast exam monthly.   We reviewed recommendations for regular mammograms and breast cancer screening.  Cervical cancer screening: You are status post hysterectomy    Skin cancer screening: Check your skin regularly for new changes, growing lesions, or other lesions of concern Come in for evaluation if you have skin lesions of concern.  Lung cancer screening: If you have a greater than 20 pack year history of tobacco use, then you may qualify for  lung cancer screening with a chest CT scan.   Please call your insurance company to inquire about coverage for this test.  We currently don't have screenings for other cancers besides breast, cervical, colon, and lung cancers.  If you have a strong family history of cancer or have other cancer screening concerns, please let me know.    Bone health: Get at least 150 minutes of aerobic exercise weekly Get weight bearing exercise at least once weekly Bone density test:  A bone density test is an imaging test that uses a type of X-ray to measure the amount of calcium and other minerals in your bones. The test may be used to diagnose or screen you for a condition that causes weak or thin bones (osteoporosis), predict your risk for a broken bone (fracture), or determine how well your osteoporosis treatment is working. The bone density test is recommended for females 65 and older, or females or males <65 if certain risk factors such as thyroid disease, long term use of steroids such as for asthma or rheumatological issues, vitamin D deficiency, estrogen deficiency, family history of osteoporosis, self or family history of fragility fracture in first degree relative.  Your May 2022 bone density test was normal    Heart health: Get at least 150 minutes of aerobic exercise weekly Limit alcohol It is important to maintain a healthy blood pressure and healthy cholesterol numbers  Heart disease screening: Screening for heart disease includes screening for blood pressure, fasting  lipids, glucose/diabetes screening, BMI height to weight ratio, reviewed of smoking status, physical activity, and diet.    Goals include blood pressure 120/80 or less, maintaining a healthy lipid/cholesterol profile, preventing diabetes or keeping diabetes numbers under good control, not smoking or using tobacco products, exercising most days per week or at least 150 minutes per week of exercise, and eating healthy variety of  fruits and vegetables, healthy oils, and avoiding unhealthy food choices like fried food, fast food, high sugar and high cholesterol foods.    Other tests may possibly include EKG test, CT coronary calcium score, echocardiogram, exercise treadmill stress test.     Medical care options: I recommend you continue to seek care here first for routine care.  We try really hard to have available appointments Monday through Friday daytime hours for sick visits, acute visits, and physicals.  Urgent care should be used for after hours and weekends for significant issues that cannot wait till the next day.  The emergency department should be used for significant potentially life-threatening emergencies.  The emergency department is expensive, can often have long wait times for less significant concerns, so try to utilize primary care, urgent care, or telemedicine when possible to avoid unnecessary trips to the emergency department.  Virtual visits and telemedicine have been introduced since the pandemic started in 2020, and can be convenient ways to receive medical care.  We offer virtual appointments as well to assist you in a variety of options to seek medical care.   Advanced Directives: I recommend you consider completing a Health Care Power of Attorney and Living Will.   These documents respect your wishes and help alleviate burdens on your loved ones if you were to become terminally ill or be in a position to need those documents enforced.    You can complete Advanced Directives yourself, have them notarized, then have copies made for our office, for you and for anybody you feel should have them in safe keeping.  Or, you can have an attorney prepare these documents.   If you haven't updated your Last Will and Testament in a while, it may be worthwhile having an attorney prepare these documents together and save on some costs.      Separate significant issues discussed: History of pulmonary nodule,  stable on last 2 years of CT, updated CT chest nodule follow-up ordered today  Diabetes-we discussed possibly trying Rybelsus instead for the benefit of weight loss and diabetes and heart disease benefit.  She wants to stay on Ozempic a little bit longer.  Mounjaro did not help as much with weight loss.  We discussed lipids, her recent lipid profile that looked great but I do not have a copy of her lipid profile she had done elsewhere.  Continue Crestor 20 mg daily.  Discussed some of her concerns regarding the lipid profile test, counseled on diet and exercise  Continue follow-up with psychiatry  Insomnia-uses Ambien or melatonin intermittently, not every single day  Asthma-no recent concerns, continue albuterol as needed  Hypertension-continue losartan 25 mg daily.  Vitamin D deficiency-continue vitamin D supplement  I reviewed her extensive labs she had done June 2024  Graham was seen today for annual exam.  Diagnoses and all orders for this visit:  Pulmonary nodule -     CT CHEST NODULE FOLLOW UP LOW DOSE W/O; Future  Encounter for screening mammogram for malignant neoplasm of breast -     MM 3D SCREENING MAMMOGRAM BILATERAL BREAST  CKD (chronic kidney  disease) stage 1, GFR 90 ml/min or greater  Hyperlipidemia associated with type 2 diabetes mellitus (HCC)  Vitamin D deficiency  Vaccine counseling  Type 2 diabetes mellitus with hyperglycemia, without long-term current use of insulin (HCC)  Mild intermittent asthma without complication  Irritable bowel syndrome with diarrhea  Insomnia, unspecified type  Hepatic steatosis  Chronic nausea  Anxiety and depression    Follow-up pending labs, yearly for physical

## 2022-12-12 ENCOUNTER — Other Ambulatory Visit: Payer: Self-pay | Admitting: Medical

## 2022-12-20 ENCOUNTER — Other Ambulatory Visit: Payer: Self-pay | Admitting: Physician Assistant

## 2022-12-29 ENCOUNTER — Other Ambulatory Visit: Payer: Self-pay | Admitting: Medical

## 2022-12-29 MED ORDER — ONDANSETRON HCL 4 MG PO TABS: 4.0000 mg | ORAL_TABLET | Freq: Three times a day (TID) | ORAL | 0 refills | Status: DC | PRN

## 2023-01-09 ENCOUNTER — Other Ambulatory Visit: Payer: Self-pay | Admitting: Medical

## 2023-01-09 ENCOUNTER — Ambulatory Visit: Payer: 59 | Admitting: Plastic Surgery

## 2023-01-12 ENCOUNTER — Telehealth: Payer: Self-pay | Admitting: Medical

## 2023-01-12 NOTE — Telephone Encounter (Signed)
Pt left message that she would also like a refill on Phenergan to [facility]

## 2023-01-13 ENCOUNTER — Other Ambulatory Visit: Payer: Self-pay | Admitting: Medical

## 2023-01-13 MED ORDER — PROMETHAZINE HCL 25 MG PO TABS: 25.0000 mg | ORAL_TABLET | Freq: Four times a day (QID) | ORAL | 0 refills | Status: DC | PRN

## 2023-01-16 ENCOUNTER — Ambulatory Visit: Payer: 59 | Admitting: Plastic Surgery

## 2023-01-16 ENCOUNTER — Ambulatory Visit: Payer: 59 | Admitting: Student

## 2023-01-16 NOTE — Telephone Encounter (Signed)
done

## 2023-02-09 ENCOUNTER — Telehealth: Payer: Self-pay | Admitting: Medical

## 2023-02-09 DIAGNOSIS — R911 Solitary pulmonary nodule: Secondary | ICD-10-CM

## 2023-02-09 NOTE — Telephone Encounter (Signed)
Pt was notified  and will call [facility] to schedule

## 2023-02-09 NOTE — Telephone Encounter (Signed)
Pt called and states she never got a call about the imaging referral you put in back in August and it looks like it is now closed. Could you please send in another referral and she asks for a phone call when this is done and when it is time to schedule please.

## 2023-03-02 ENCOUNTER — Other Ambulatory Visit: Payer: Self-pay | Admitting: Medical

## 2023-03-02 ENCOUNTER — Telehealth: Payer: Self-pay | Admitting: Medical

## 2023-03-02 DIAGNOSIS — R911 Solitary pulmonary nodule: Secondary | ICD-10-CM

## 2023-03-02 NOTE — Telephone Encounter (Signed)
Pt is NOT a smoker, no where in chart does it show she does. I believe it needs to be CT chest wo Contrast for a follow-up.  The CT scan that was ordered is for smokers

## 2023-03-02 NOTE — Telephone Encounter (Signed)
Pt called about order to [facility] for CT,   [facility] will not let her schedule, states order is for a smoker and not the correct order

## 2023-03-06 ENCOUNTER — Other Ambulatory Visit: Payer: Self-pay | Admitting: Medical

## 2023-03-06 ENCOUNTER — Ambulatory Visit
Admission: RE | Admit: 2023-03-06 | Discharge: 2023-03-06 | Disposition: A | Discharge status: Home / Self Care | Payer: 59 | Source: Ambulatory Visit | Attending: Medical | Admitting: Medical

## 2023-03-06 DIAGNOSIS — R918 Other nonspecific abnormal finding of lung field: Secondary | ICD-10-CM | POA: Diagnosis not present

## 2023-03-06 DIAGNOSIS — R911 Solitary pulmonary nodule: Secondary | ICD-10-CM

## 2023-03-06 MED ORDER — IOPAMIDOL (ISOVUE-300) INJECTION 61%
75.0000 mL | Freq: Once | INTRAVENOUS | Status: AC | PRN
  Administered 2023-03-06: 75 mL via INTRAVENOUS

## 2023-03-10 ENCOUNTER — Ambulatory Visit: Payer: 59 | Admitting: Medical

## 2023-03-10 VITALS — BP 120/70 | HR 100 | Temp 97.9°F | Wt 219.0 lb

## 2023-03-10 DIAGNOSIS — R270 Ataxia, unspecified: Secondary | ICD-10-CM

## 2023-03-10 DIAGNOSIS — R110 Nausea: Secondary | ICD-10-CM

## 2023-03-10 DIAGNOSIS — R140 Abdominal distension (gaseous): Secondary | ICD-10-CM | POA: Diagnosis not present

## 2023-03-10 DIAGNOSIS — R142 Eructation: Secondary | ICD-10-CM

## 2023-03-10 DIAGNOSIS — R0789 Other chest pain: Secondary | ICD-10-CM | POA: Diagnosis not present

## 2023-03-10 DIAGNOSIS — R519 Headache, unspecified: Secondary | ICD-10-CM

## 2023-03-10 DIAGNOSIS — E1165 Type 2 diabetes mellitus with hyperglycemia: Secondary | ICD-10-CM | POA: Diagnosis not present

## 2023-03-10 DIAGNOSIS — R350 Frequency of micturition: Secondary | ICD-10-CM

## 2023-03-10 DIAGNOSIS — R6881 Early satiety: Secondary | ICD-10-CM | POA: Diagnosis not present

## 2023-03-10 LAB — POCT URINALYSIS DIP (PROADVANTAGE DEVICE)
Bilirubin, UA: NEGATIVE
Blood, UA: NEGATIVE
Glucose, UA: NEGATIVE mg/dL
Ketones, POC UA: NEGATIVE mg/dL
Nitrite, UA: NEGATIVE
Protein Ur, POC: NEGATIVE mg/dL
Specific Gravity, Urine: 1.02
Urobilinogen, Ur: NEGATIVE
pH, UA: 6 (ref 5.0–8.0)

## 2023-03-10 NOTE — Progress Notes (Signed)
Subjective:  [name] is a 62 y.o. female who presents for Chief Complaint  Patient presents with   right side pain    Constant pain on right side x 2-3 months. Causes nausea and indigestion. Can not drink regular coffee or pain is severe, no fiber. Has hx of IBS. Hx of side pain due to nodules on Liver from the 90's. Pt has noticed low blood sugar today 66     Here for chronic pains in right side since 1990s, hx/o nodules on liver.   Has gotten use to pain there, but lately has severe pain.  Has been using alternating ibuprofen and acetaminophen.   Pain moves, sometimes deep in RUQ, sometimes posterior, sometimes more inferior or superior than right flank.   Pain a little better this morning  Has some urinary frequency but no burning, no blood in urine, no odor in urine.  In the last few days she has been eating more fiber and thinks that could have bloated her.  She has a history of IBS.  Has 4-5 bowel movements in the morning at home typically.  The less frequent bowel movements are late.  Uses Zofran rarely, has lots of nausea.  No black or bloody stool.  Belches a lot which this has been ongoing since childhood.  She notes ongoing prominent headaches daily.  Sometimes feels off balance.  No confusion, no slurred speech.  No incontinence.  She did see an eye doctor recently and has had some vision changes, has some new cataracts  Hx/o IBS, normally 4-5 BM in morning, less frequent of late.    Has been using probiotics.  No other aggravating or relieving factors.    No other c/o.  Past Medical History:  Diagnosis Date   Anxiety    Arthritis    hips and knees   Asthma    stress induced asthma   Chest pain 06/2019   Chronic kidney disease    ckd stage 1 per 5-26-201 dr Lawson Fiscal foster ov note   Depression    DM type 2 (diabetes mellitus, type 2) (HCC) dx jan 2021   Fibroid    uterine   GERD (gastroesophageal reflux disease)    Hepatic steatosis 12/05/2019    Hyperlipidemia    IBS (irritable bowel syndrome)    Liver nodule 1999   no follow up needed was due to stress   Morbid obesity (HCC)    Neuropathy    both fingers and toes at times   Obesity    Protein in urine 03/2019   Tubular adenoma of colon 01/2012   Current Outpatient Medications on File Prior to Visit  Medication Sig Dispense Refill   albuterol (VENTOLIN HFA) 108 (90 Base) MCG/ACT inhaler Inhale 2 puffs into the lungs every 6 (six) hours as needed for wheezing. 18 each 2   aspirin 81 MG tablet Take 81 mg by mouth daily.     b complex vitamins capsule Take 1 capsule by mouth daily.     buPROPion (WELLBUTRIN XL) 150 MG 24 hr tablet Take 150 mg by mouth every morning.     Cholecalciferol (VITAMIN D) 50 MCG (2000 UT) CAPS Take 1 capsule (2,000 Units total) by mouth daily. 90 capsule 3   clonazePAM (KLONOPIN) 0.5 MG tablet Take 0.5 mg by mouth daily as needed.     diclofenac Sodium (VOLTAREN) 1 % GEL APPLY 4 G TOPICALLY 4 (FOUR) TIMES DAILY. 200 g 1   fluticasone (FLONASE) 50 MCG/ACT nasal spray  SPRAY 2 SPRAYS INTO EACH NOSTRIL EVERY DAY 48 mL 0   gabapentin (NEURONTIN) 100 MG capsule Take 100 mg by mouth 3 (three) times daily.     hyoscyamine (LEVBID) 0.375 MG 12 hr tablet TAKE 1 TABLET (0.375 MG TOTAL) BY MOUTH EVERY 12 (TWELVE) HOURS AS NEEDED. 180 tablet 1   losartan (COZAAR) 25 MG tablet TAKE 1 TABLET (25 MG TOTAL) BY MOUTH DAILY. 90 tablet 1   magnesium gluconate (MAGONATE) 500 MG tablet Take 500 mg by mouth 2 (two) times daily.     meclizine (ANTIVERT) 25 MG tablet Take 1 tablet (25 mg total) by mouth 2 (two) times daily as needed for dizziness. 30 tablet 1   Melatonin 5 MG CAPS Take 5 mg by mouth at bedtime as needed (sleep).     montelukast (SINGULAIR) 10 MG tablet Take 1 tablet (10 mg total) by mouth at bedtime. 90 tablet 3   omeprazole (PRILOSEC) 40 MG capsule TAKE 1 CAPSULE BY MOUTH TWICE A DAY 180 capsule 1   ondansetron (ZOFRAN) 4 MG tablet Take 1 tablet (4 mg total) by  mouth every 8 (eight) hours as needed for nausea or vomiting. 30 tablet 0   promethazine (PHENERGAN) 25 MG tablet Take 1 tablet (25 mg total) by mouth every 6 (six) hours as needed for nausea or vomiting. 1/2-1 tablet po prn q6 hours 12 tablet 0   rosuvastatin (CRESTOR) 20 MG tablet TAKE 1 TABLET BY MOUTH EVERY DAY 90 tablet 1   Semaglutide, 1 MG/DOSE, (OZEMPIC, 1 MG/DOSE,) 4 MG/3ML SOPN INJECT 1 MG INTO THE SKIN ONE TIME PER WEEK 3 mL 2   venlafaxine XR (EFFEXOR-XR) 75 MG 24 hr capsule Take 75 mg by mouth in the morning and at bedtime.     zolpidem (AMBIEN) 10 MG tablet Take 10 mg by mouth at bedtime as needed for sleep.     Glucose Blood (BLOOD GLUCOSE TEST STRIPS) STRP Test once a day. Pt has a onetouch verio flex meter dx e11.9 100 strip 1   Lancets (ONETOUCH ULTRASOFT) lancets Test once a day. Dx E11.9 pt has onetouch verio flex meter 100 each 1   No current facility-administered medications on file prior to visit.   Past Surgical History:  Procedure Laterality Date   COLONOSCOPY  09/2022   polyps, Dr. Russella Dar, repeat 3 years   colonscopy  2017   COLPOSCOPY  1990   results were normal   CYSTOSCOPY  02/28/2020   Procedure: CYSTOSCOPY;  Surgeon: Willodean Rosenthal, MD;  Location: [facility];  Service: Gynecology;;   DILITATION & CURRETTAGE/HYSTROSCOPY WITH NOVASURE ABLATION N/A 08/23/2019   Procedure: DILATATION & CURETTAGE/DIAGNOSTIC HYSTEROSCOPY;  Surgeon: Willodean Rosenthal, MD;  Location: [facility];  Service: Gynecology;  Laterality: N/A;   IR GENERIC HISTORICAL  03/21/2016   IR EMBO TUMOR ORGAN ISCHEMIA INFARCT INC GUIDE ROADMAPPING 03/21/2016 Oley Balm, MD [facility]   IR GENERIC HISTORICAL  03/21/2016   IR US GUIDE VASC ACCESS RIGHT 03/21/2016 Oley Balm, MD [facility]   IR GENERIC HISTORICAL  03/21/2016   IR ANGIOGRAM SELECTIVE EACH ADDITIONAL VESSEL 03/21/2016 Oley Balm, MD [facility]   IR GENERIC HISTORICAL   03/21/2016   IR ANGIOGRAM SELECTIVE EACH ADDITIONAL VESSEL 03/21/2016 Oley Balm, MD [facility]   IR GENERIC HISTORICAL  03/21/2016   IR ANGIOGRAM PELVIS SELECTIVE OR SUPRASELECTIVE 03/21/2016 Oley Balm, MD [facility]   IR GENERIC HISTORICAL  03/21/2016   IR ANGIOGRAM PELVIS SELECTIVE OR SUPRASELECTIVE 03/21/2016 Oley Balm,  MD [facility]   IR GENERIC HISTORICAL  02/27/2016   IR RADIOLOGIST EVAL & MGMT 02/27/2016 Oley Balm, MD [facility]   IR GENERIC HISTORICAL  04/03/2016   IR RADIOLOGIST EVAL & MGMT 04/03/2016 Oley Balm, MD [facility]   IR RADIOLOGIST EVAL & MGMT  08/26/2016   labial cyst removal     ROBOTIC ASSISTED TOTAL HYSTERECTOMY Bilateral 02/28/2020   Procedure: XI ROBOTIC ASSISTED TOTAL HYSTERECTOMY AND BILATERAL SALPINGECTOMY;  Surgeon: Willodean Rosenthal, MD;  Location: [facility];  Service: Gynecology;  Laterality: Bilateral;   TUBAL LIGATION     20 years ago   UPPER GASTROINTESTINAL ENDOSCOPY      The following portions of the patient's history were reviewed and updated as appropriate: allergies, current medications, past family history, past medical history, past social history, past surgical history and problem list.  ROS Otherwise as in subjective above    Objective: BP 120/70   Pulse 100   Temp 97.9 F (36.6 C)   Wt 219 lb (99.3 kg)   LMP 06/23/2014   BMI 33.79 kg/m   General appearance: alert, no distress, well developed, well nourished No obvious chest wall tenderness, normal inspiration expiration HEENT: normocephalic, sclerae anicteric, conjunctiva pink and moist, TMs pearly, nares patent, no discharge or erythema, pharynx normal Oral cavity: MMM, no lesions Neck: supple, no lymphadenopathy, no thyromegaly, no masses Heart: RRR, normal S1, S2, no murmurs Lungs: CTA bilaterally, no wheezes, rhonchi, or rales Abdomen: +bs, soft, non tender, non distended, no masses, no hepatomegaly, no  splenomegaly Pulses: 2+ radial pulses, 2+ pedal pulses, normal cap refill Ext: no edema Skin: No obvious rash Belching quite a bit    Assessment: Encounter Diagnoses  Name Primary?   Urine frequency Yes   Nonintractable headache, unspecified chronicity pattern, unspecified headache type    Nausea    Belching    Early satiety    Bloating    Chest discomfort    Ataxia    Type 2 diabetes mellitus with hyperglycemia, without long-term current use of insulin (HCC)      Plan: She has numerous symptoms, different concerns  Given the side pain, chest discomfort, this could be related to bloating, could be a gallbladder issue, no sign of shingles.  Consider HIDA scan.  Labs today.  She just had a CT chest follow-up.  Results have not been reported yet  Consider head imaging given the ataxia, confusion  Diabetes-for now continue Ozempic, or consider different medication as Ozempic can worsen bloating and belching  May need to consume less fiber in general since this may be putting her  Continue PPI, Zofran as needed  Consider follow-up with gastroenterology  [name] was seen today for right side pain.  Diagnoses and all orders for this visit:  Urine frequency -     POCT Urinalysis DIP (Proadvantage Device)  Nonintractable headache, unspecified chronicity pattern, unspecified headache type  Nausea -     Comprehensive metabolic panel -     CBC  Belching -     Comprehensive metabolic panel -     CBC  Early satiety -     Comprehensive metabolic panel -     CBC  Bloating -     Comprehensive metabolic panel -     CBC  Chest discomfort -     Comprehensive metabolic panel -     CBC  Ataxia  Type 2 diabetes mellitus with hyperglycemia, without long-term current use of insulin (HCC) -  Comprehensive metabolic panel -     Hemoglobin A1c    Follow up: pending labs

## 2023-03-11 ENCOUNTER — Ambulatory Visit
Admission: RE | Admit: 2023-03-11 | Discharge: 2023-03-11 | Disposition: A | Discharge status: Home / Self Care | Payer: 59 | Source: Ambulatory Visit | Attending: Medical | Admitting: Medical

## 2023-03-11 DIAGNOSIS — Z1231 Encounter for screening mammogram for malignant neoplasm of breast: Secondary | ICD-10-CM | POA: Diagnosis not present

## 2023-03-11 LAB — COMPREHENSIVE METABOLIC PANEL
ALT: 12 IU/L (ref 0–32)
AST: 16 IU/L (ref 0–40)
Albumin: 4.5 g/dL (ref 3.9–4.9)
Alkaline Phosphatase: 92 IU/L (ref 44–121)
BUN/Creatinine Ratio: 12 (ref 12–28)
BUN: 10 mg/dL (ref 8–27)
CO2: 24 mmol/L (ref 20–29)
Calcium: 9.6 mg/dL (ref 8.7–10.3)
Chloride: 103 mmol/L (ref 96–106)
Creatinine, Ser: 0.83 mg/dL (ref 0.57–1.00)
Globulin, Total: 2.7 g/dL (ref 1.5–4.5)
Glucose: 112 mg/dL — ABNORMAL HIGH (ref 70–99)
Potassium: 4.3 mmol/L (ref 3.5–5.2)
Sodium: 144 mmol/L (ref 134–144)
Total Protein: 7.2 g/dL (ref 6.0–8.5)
eGFR: 80 mL/min/{1.73_m2} (ref >59)

## 2023-03-11 LAB — CBC
Hematocrit: 37.9 % (ref 34.0–46.6)
Hemoglobin: 12.3 g/dL (ref 11.1–15.9)
MCH: 25.1 pg — ABNORMAL LOW (ref 26.6–33.0)
MCHC: 32.5 g/dL (ref 31.5–35.7)
MCV: 77 fL — ABNORMAL LOW (ref 79–97)
Platelets: 249 10*3/uL (ref 150–450)
RBC: 4.9 x10E6/uL (ref 3.77–5.28)
RDW: 13.3 % (ref 11.7–15.4)
WBC: 6.8 10*3/uL (ref 3.4–10.8)

## 2023-03-11 LAB — HEMOGLOBIN A1C
Est. average glucose Bld gHb Est-mCnc: 131 mg/dL
Hgb A1c MFr Bld: 6.2 % — ABNORMAL HIGH (ref 4.8–5.6)

## 2023-03-12 NOTE — Progress Notes (Signed)
Labs overall okay.  Blood sugar 112 but liver kidney and electrolytes normal, blood counts okay, diabetes marker stable at 6.2%.  You had a lot of symptoms recent including pain, urinary frequency, nausea, headache, off-balance sensation, chronic belching and numerous bowel movements per day.  Recommendations: I recommend for the time being to stop Ozempic and eat less fiber since you are having symptoms of bloating and symptoms in general  We are still waiting on the recent scan you had.  If your nausea and bloating and pain does not improve in the next 2 weeks after stopping Ozempic we may need to move forward with a HIDA scan to check your gallbladder.  I would recommend you call and schedule a follow-up with gastroenterology  I do recommend you take a probiotic daily if you are not doing so such as IBgard or Align over-the-counter  Given the headaches and feeling of off balance, I can refer you to neurology or set up for a head scan and MRI brain.  Let me know which you would rather do for further evaluation  I recommend only doing 5 mg Ambien or half tablet at night instead of 10 mg in case this is contributing to some of the balance symptoms    Continue rest of medicines as usual

## 2023-03-14 ENCOUNTER — Other Ambulatory Visit: Payer: Self-pay | Admitting: Medical

## 2023-03-14 DIAGNOSIS — R270 Ataxia, unspecified: Secondary | ICD-10-CM

## 2023-03-14 DIAGNOSIS — R110 Nausea: Secondary | ICD-10-CM

## 2023-03-20 NOTE — Progress Notes (Signed)
Results sent through MyChart

## 2023-03-22 ENCOUNTER — Other Ambulatory Visit: Payer: Self-pay | Admitting: Medical

## 2023-03-23 ENCOUNTER — Other Ambulatory Visit: Payer: Self-pay | Admitting: Medical

## 2023-03-23 MED ORDER — OZEMPIC (2 MG/DOSE) 8 MG/3ML ~~LOC~~ SOPN: 2.0000 mg | PEN_INJECTOR | SUBCUTANEOUS | 2 refills | Status: AC

## 2023-03-23 NOTE — Telephone Encounter (Signed)
Pt stopped ozempic for 2 weeks to see if the pain subsided. It has calmed down but she wants to start back on it. So send in 1mg  for a month then she can up to 2mg  after a month

## 2023-03-24 ENCOUNTER — Other Ambulatory Visit: Payer: Self-pay | Admitting: Medical

## 2023-03-24 ENCOUNTER — Ambulatory Visit: Payer: 59 | Admitting: Plastic Surgery

## 2023-03-24 MED ORDER — OZEMPIC (1 MG/DOSE) 4 MG/3ML ~~LOC~~ SOPN: 1.0000 mg | PEN_INJECTOR | SUBCUTANEOUS | 0 refills | Status: DC

## 2023-03-30 ENCOUNTER — Other Ambulatory Visit: Payer: Self-pay | Admitting: Medical

## 2023-04-19 ENCOUNTER — Other Ambulatory Visit: Payer: Self-pay | Admitting: Medical

## 2023-04-20 ENCOUNTER — Ambulatory Visit
Admission: RE | Admit: 2023-04-20 | Discharge: 2023-04-20 | Disposition: A | Discharge status: Home / Self Care | Payer: 59 | Source: Ambulatory Visit | Attending: Medical | Admitting: Medical

## 2023-04-20 DIAGNOSIS — R42 Dizziness and giddiness: Secondary | ICD-10-CM | POA: Diagnosis not present

## 2023-04-20 DIAGNOSIS — R110 Nausea: Secondary | ICD-10-CM

## 2023-04-20 DIAGNOSIS — R270 Ataxia, unspecified: Secondary | ICD-10-CM

## 2023-04-20 DIAGNOSIS — R519 Headache, unspecified: Secondary | ICD-10-CM | POA: Diagnosis not present

## 2023-04-20 NOTE — Telephone Encounter (Signed)
Pt was only suppose to do 1mg  for a month

## 2023-05-11 ENCOUNTER — Ambulatory Visit (INDEPENDENT_AMBULATORY_CARE_PROVIDER_SITE_OTHER): Payer: 59 | Admitting: Medical

## 2023-05-11 VITALS — BP 124/82 | HR 106 | Wt 224.6 lb

## 2023-05-11 DIAGNOSIS — R519 Headache, unspecified: Secondary | ICD-10-CM | POA: Diagnosis not present

## 2023-05-11 DIAGNOSIS — F419 Anxiety disorder, unspecified: Secondary | ICD-10-CM | POA: Diagnosis not present

## 2023-05-11 DIAGNOSIS — R110 Nausea: Secondary | ICD-10-CM | POA: Diagnosis not present

## 2023-05-11 DIAGNOSIS — R911 Solitary pulmonary nodule: Secondary | ICD-10-CM

## 2023-05-11 DIAGNOSIS — F32A Depression, unspecified: Secondary | ICD-10-CM

## 2023-05-11 DIAGNOSIS — M5432 Sciatica, left side: Secondary | ICD-10-CM

## 2023-05-11 MED ORDER — PREDNISONE 20 MG PO TABS: ORAL_TABLET | ORAL | 0 refills | Status: DC

## 2023-05-11 MED ORDER — MONTELUKAST SODIUM 10 MG PO TABS
10.0000 mg | ORAL_TABLET | Freq: Every day | ORAL | 2 refills | Status: DC
Start: 2023-05-11 — End: 2023-06-02

## 2023-05-11 MED ORDER — LOSARTAN POTASSIUM 25 MG PO TABS: 25.0000 mg | ORAL_TABLET | Freq: Every day | ORAL | 2 refills | Status: DC

## 2023-05-11 NOTE — Progress Notes (Signed)
Subjective:  [name] is a 63 y.o. female who presents for Chief Complaint  Patient presents with   Follow-up    Follow-up on MRI brain and Sciatica pain     Here for concerns  Been dealing with sciatica the last week.  Has pain going down her left buttock and leg.  Never had sciatica before.  Is a little bit better today in the last few days after using some ibuprofen but still not back to 100%.  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  She is also here to follow-up on recent MRI of the brain and other imaging recently.  She occasionally still gets a sinus pressure wonders about sinus infection.  She has trouble with sleep and uses a couple different things for sleep.  She sees psychiatry as well for mood.  Clonazepam has been a life saver for her.  It really helps her to calm her nerves.  Cannot work without it.  No other aggravating or relieving factors.    No other c/o.  Past Medical History:  Diagnosis Date   Anxiety    Arthritis    hips and knees   Asthma    stress induced asthma   Chest pain 06/2019   Chronic kidney disease    ckd stage 1 per 5-26-201 dr Lawson Fiscal foster ov note   Depression    DM type 2 (diabetes mellitus, type 2) (HCC) dx jan 2021   Fibroid    uterine   GERD (gastroesophageal reflux disease)    Hepatic steatosis 12/05/2019   Hyperlipidemia    IBS (irritable bowel syndrome)    Liver nodule 1999   no follow up needed was due to stress   Morbid obesity (HCC)    Neuropathy    both fingers and toes at times   Obesity    Protein in urine 03/2019   Tubular adenoma of colon 01/2012   Current Outpatient Medications on File Prior to Visit  Medication Sig Dispense Refill   albuterol (VENTOLIN HFA) 108 (90 Base) MCG/ACT inhaler Inhale 2 puffs into the lungs every 6 (six) hours as needed for wheezing. 18 each 2   aspirin 81 MG tablet Take 81 mg by mouth daily.     b complex  vitamins capsule Take 1 capsule by mouth daily.     Cholecalciferol (VITAMIN D) 50 MCG (2000 UT) CAPS Take 1 capsule (2,000 Units total) by mouth daily. 90 capsule 3   clonazePAM (KLONOPIN) 0.5 MG tablet Take 0.5 mg by mouth daily as needed.     diclofenac Sodium (VOLTAREN) 1 % GEL APPLY 4 G TOPICALLY 4 (FOUR) TIMES DAILY. 200 g 1   fluticasone (FLONASE) 50 MCG/ACT nasal spray SPRAY 2 SPRAYS INTO EACH NOSTRIL EVERY DAY 48 mL 0   gabapentin (NEURONTIN) 100 MG capsule Take 100 mg by mouth 3 (three) times daily.     losartan (COZAAR) 25 MG tablet TAKE 1 TABLET (25 MG TOTAL) BY MOUTH DAILY. 90 tablet 1   magnesium gluconate (MAGONATE) 500 MG tablet Take 500 mg by mouth 2 (two) times daily.     meclizine (ANTIVERT) 25 MG tablet Take 1 tablet (25 mg total) by mouth 2 (two) times daily as needed for dizziness. 30 tablet 1   montelukast (SINGULAIR) 10 MG tablet Take 1 tablet (10 mg total) by mouth at bedtime. 90 tablet 3   omeprazole (PRILOSEC) 40 MG  capsule TAKE 1 CAPSULE BY MOUTH TWICE A DAY 180 capsule 1   ondansetron (ZOFRAN) 4 MG tablet TAKE 1 TABLET BY MOUTH EVERY 8 HOURS AS NEEDED FOR NAUSEA AND VOMITING 30 tablet 0   promethazine (PHENERGAN) 25 MG tablet Take 1 tablet (25 mg total) by mouth every 6 (six) hours as needed for nausea or vomiting. 1/2-1 tablet po prn q6 hours 12 tablet 0   rosuvastatin (CRESTOR) 20 MG tablet TAKE 1 TABLET BY MOUTH EVERY DAY 90 tablet 1   venlafaxine XR (EFFEXOR-XR) 75 MG 24 hr capsule Take 75 mg by mouth in the morning and at bedtime.     Glucose Blood (BLOOD GLUCOSE TEST STRIPS) STRP Test once a day. Pt has a onetouch verio flex meter dx e11.9 100 strip 1   hyoscyamine (LEVBID) 0.375 MG 12 hr tablet TAKE 1 TABLET (0.375 MG TOTAL) BY MOUTH EVERY 12 (TWELVE) HOURS AS NEEDED. 180 tablet 1   Lancets (ONETOUCH ULTRASOFT) lancets Test once a day. Dx E11.9 pt has onetouch verio flex meter 100 each 1   No current facility-administered medications on file prior to visit.      The following portions of the patient's history were reviewed and updated as appropriate: allergies, current medications, past family history, past medical history, past social history, past surgical history and problem list.  ROS Otherwise as in subjective above    Objective: BP 124/82   Pulse (!) 106   Wt 224 lb 9.6 oz (101.9 kg)   LMP 06/23/2014   BMI 34.66 kg/m   Wt Readings from Last 3 Encounters:  05/11/23 224 lb 9.6 oz (101.9 kg)  03/10/23 219 lb (99.3 kg)  11/12/22 215 lb 9.6 oz (97.8 kg)    General appearance: alert, no distress, well developed, well nourished Lumbar spine nontender, she is walking a little bit with a lip of the left leg, range of motion about 80% normal  Legs nontender but has some mild pain with straight leg raise on the left at 40 degrees  abdomen: +bs, soft, non tender, non distended, no masses, no hepatomegaly, no splenomegaly Pulses: 2+ radial pulses, 2+ pedal pulses, normal cap refill Ext: no edema   Assessment: Encounter Diagnoses  Name Primary?   Sciatica of left side Yes   Anxiety and depression    Chronic nausea    Pulmonary nodule    Nonintractable headache, unspecified chronicity pattern, unspecified headache type      Plan: Sciatica-advise she do ibuprofen tonight but tomorrow morning start prednisone for the next 3 days and hold off on the ibuprofen.  Advised stretching, use pillows under her legs at night, can use heat.  If not much improved over the next 3 days and let me know.  Consider massage therapy or chiropractic visit  I reviewed her recent scans  It was aortic atherosclerosis mild on CT scan from 03/06/2023.  Pulmonary nodules are stable and no additional CT scan follow-up was advised  She is compliant with statin  Given recent headache and dizziness that she now thinks it was more related to her Ambien so she has discontinued Ambien.  MRI brain reviewed without any worrisome findings.  This test was done on  04/20/2023  Many of her medicines are as needed and some of her medicines overlap with other such as Zofran and Phenergan.  She only uses Phenergan for worse nausea.  We discussed cutting out melatonin and Ambien.  Limit hydroxyzine.  We discussed other medicines that she takes that could  be sedating  Follow-up with psychiatry as usual  Continue rest of medicines as usual  Mellissa was seen today for follow-up.  Diagnoses and all orders for this visit:  Sciatica of left side  Anxiety and depression  Chronic nausea  Pulmonary nodule  Nonintractable headache, unspecified chronicity pattern, unspecified headache type  Other orders -     predniSONE (DELTASONE) 20 MG tablet; 3 tablets today, 2 tablets tomorrow, 1 tablet the third day    Follow up: As needed

## 2023-05-12 ENCOUNTER — Encounter: Payer: Self-pay | Admitting: Internal Medicine

## 2023-06-02 ENCOUNTER — Ambulatory Visit (INDEPENDENT_AMBULATORY_CARE_PROVIDER_SITE_OTHER): Payer: 59 | Admitting: Plastic Surgery

## 2023-06-02 ENCOUNTER — Other Ambulatory Visit (HOSPITAL_COMMUNITY)
Admission: RE | Admit: 2023-06-02 | Discharge: 2023-06-02 | Disposition: A | Discharge status: Home / Self Care | Source: Ambulatory Visit | Attending: Plastic Surgery | Admitting: Plastic Surgery

## 2023-06-02 VITALS — BP 134/88 | HR 106

## 2023-06-02 DIAGNOSIS — D1720 Benign lipomatous neoplasm of skin and subcutaneous tissue of unspecified limb: Secondary | ICD-10-CM | POA: Insufficient documentation

## 2023-06-02 DIAGNOSIS — D1724 Benign lipomatous neoplasm of skin and subcutaneous tissue of left leg: Secondary | ICD-10-CM

## 2023-06-02 NOTE — Progress Notes (Signed)
 Procedure Note  Preoperative Dx: mass of left thigh  Postoperative Dx: Same  Procedure: Excision of mass left thigh 3 x 4 cm cm  Anesthesia: Lidocaine 1% with 1:100,000 epinephrine  Description of Procedure: Risks and complications were explained to the patient.  Consent was confirmed and the patient understands the risks and benefits.  The potential complications and alternatives were explained and the patient consents.  The patient expressed understanding the option of not having the procedure and the risks of a scar.  Time out was called and all information was confirmed to be correct.    The area was prepped and drapped.  Lidocaine 1% with epinephrine was injected in the subcutaneous area.  After waiting several minutes for the local to take affect a #15 blade was used to incise the skin over the area.  The entire lesion was released from the surrounding tissue and removed.  The skin edges were reapproximated with 5-0 Monocryl subcuticular running closure.  A dressing was applied.  The patient was given instructions on how to care for the area and a follow up appointment.  [name] tolerated the procedure well and there were no complications. The specimen was sent to pathology.

## 2023-06-04 LAB — SURGICAL PATHOLOGY

## 2023-06-12 ENCOUNTER — Encounter: Payer: Self-pay | Admitting: Surgical

## 2023-06-12 ENCOUNTER — Ambulatory Visit: Admitting: Surgical

## 2023-06-12 VITALS — BP 135/81 | HR 97 | Ht 67.0 in | Wt 225.2 lb

## 2023-06-12 DIAGNOSIS — R2242 Localized swelling, mass and lump, left lower limb: Secondary | ICD-10-CM

## 2023-06-12 DIAGNOSIS — D1720 Benign lipomatous neoplasm of skin and subcutaneous tissue of unspecified limb: Secondary | ICD-10-CM

## 2023-06-12 NOTE — Progress Notes (Signed)
 63 year old female here for follow-up after excision of left inner thigh mass.  Pathology showed lipoma.  Patient is doing well she is not having any issues.  She is here for suture removal.  Steri-Strip was removed, 2 Monocryl knots were noted.  These were clipped.  She tolerated this well without any issue.  Incision is healing well, no signs of infection or concern on exam.  No surrounding redness.  A/P:  Recommend starting scar cream in about 1 to 2 weeks, discussed various scar cream recommendations.  Discussed use of sunscreen if the area is exposed to the sun to prevent hyperpigmentation. Avoid strenuous activities or deep bending to prevent increased tension on the incision.  Recommend following up as needed, call with questions or concerns.

## 2023-07-21 ENCOUNTER — Other Ambulatory Visit: Payer: Self-pay | Admitting: Physician Assistant

## 2023-07-21 NOTE — Telephone Encounter (Signed)
 Needs an appointment.

## 2023-07-28 ENCOUNTER — Ambulatory Visit: Payer: 59

## 2023-07-28 VITALS — BP 138/78 | HR 108 | Temp 98.1°F | Ht 67.0 in | Wt 228.8 lb

## 2023-07-28 DIAGNOSIS — Z0000 Encounter for general adult medical examination without abnormal findings: Secondary | ICD-10-CM

## 2023-07-28 NOTE — Patient Instructions (Signed)
 Ms. Blaydes , Thank you for taking time to come for your Medicare Wellness Visit. I appreciate your ongoing commitment to your health goals. Please review the following plan we discussed and let me know if I can assist you in the future.   Referrals/Orders/Follow-Ups/Clinician Recommendations: none  This is a list of the screening recommended for you and due dates:  Health Maintenance  Topic Date Due   Pneumococcal Vaccination (1 of 2 - PCV) Never done   Zoster (Shingles) Vaccine (1 of 2) Never done   Eye exam for diabetics  01/11/2023   Yearly kidney health urinalysis for diabetes  08/20/2023   Hemoglobin A1C  09/08/2023   Complete foot exam   09/16/2023   Flu Shot  10/23/2023   Yearly kidney function blood test for diabetes  03/09/2024   Medicare Annual Wellness Visit  07/27/2024   Mammogram  03/10/2025   Colon Cancer Screening  10/19/2025   DTaP/Tdap/Td vaccine (3 - Td or Tdap) 07/30/2031   Hepatitis C Screening  Completed   HIV Screening  Completed   HPV Vaccine  Aged Out   Meningitis B Vaccine  Aged Out   COVID-19 Vaccine  Discontinued    Advanced directives: (ACP Link)Information on Advanced Care Planning can be found at [employer]  Secretary of State Advance Health Care Directives Advance Health Care Directives. [email or web address]   Next Medicare Annual Wellness Visit scheduled for next year: Yes  Have you seen your provider in the last 6 months (3 months if uncontrolled diabetes)? Yes, has appointment 11/17/2023  insert Preventive Care attachment Insert FALL PREVENTION attachment if needed

## 2023-07-28 NOTE — Progress Notes (Signed)
 Subjective:   [name] is a 63 y.o. who presents for a Medicare Wellness preventive visit.  Visit Complete: In person    Persons Participating in Visit: Patient.  AWV Questionnaire: No: Patient Medicare AWV questionnaire was not completed prior to this visit.  Cardiac Risk Factors include: advanced age (>16men, >3 women);diabetes mellitus;dyslipidemia     Objective:    Today's Vitals   07/28/23 1132 07/28/23 1133  BP: 138/78   Pulse: (!) 108   Temp: 98.1 F (36.7 C)   TempSrc: Oral   SpO2: 96%   Weight: 228 lb 12.8 oz (103.8 kg)   Height: 5\' 7"  (1.702 m)   PainSc:  7    Body mass index is 35.84 kg/m.     07/28/2023   11:44 AM 07/22/2022   12:13 PM 07/12/2021   11:55 AM 02/15/2021    3:02 PM 06/27/2020    3:33 PM 02/28/2020    6:26 AM 02/20/2020   11:14 AM  Advanced Directives  Does Patient Have a Medical Advance Directive? No No No No No No No  Would patient like information on creating a medical advance directive? No - Patient declined No - Patient declined No - Patient declined   No - Patient declined No - Patient declined    Current Medications (verified) Outpatient Encounter Medications as of 07/28/2023  Medication Sig   albuterol  (VENTOLIN  HFA) 108 (90 Base) MCG/ACT inhaler Inhale 2 puffs into the lungs every 6 (six) hours as needed for wheezing.   aspirin  81 MG tablet Take 81 mg by mouth daily.   b complex vitamins capsule Take 1 capsule by mouth daily.   Cholecalciferol (VITAMIN D ) 50 MCG (2000 UT) CAPS Take 1 capsule (2,000 Units total) by mouth daily.   clonazePAM  (KLONOPIN ) 0.5 MG tablet Take 0.5 mg by mouth daily as needed.   diclofenac  Sodium (VOLTAREN ) 1 % GEL APPLY 4 G TOPICALLY 4 (FOUR) TIMES DAILY.   fluticasone  (FLONASE ) 50 MCG/ACT nasal spray SPRAY 2 SPRAYS INTO EACH NOSTRIL EVERY DAY   gabapentin  (NEURONTIN ) 100 MG capsule Take 100 mg by mouth 3 (three) times daily.   Glucose Blood (BLOOD GLUCOSE TEST STRIPS) STRP Test once a day. Pt  has a onetouch verio flex meter dx e11.9   hyoscyamine  (LEVBID) 0.375 MG 12 hr tablet TAKE 1 TABLET (0.375 MG TOTAL) BY MOUTH EVERY 12 (TWELVE) HOURS AS NEEDED.   Lancets (ONETOUCH ULTRASOFT) lancets Test once a day. Dx E11.9 pt has onetouch verio flex meter   losartan  (COZAAR ) 25 MG tablet Take 1 tablet (25 mg total) by mouth daily.   magnesium  gluconate (MAGONATE) 500 MG tablet Take 500 mg by mouth 2 (two) times daily.   meclizine  (ANTIVERT ) 25 MG tablet Take 1 tablet (25 mg total) by mouth 2 (two) times daily as needed for dizziness.   omeprazole  (PRILOSEC) 40 MG capsule TAKE 1 CAPSULE BY MOUTH TWICE A DAY   ondansetron  (ZOFRAN ) 4 MG tablet TAKE 1 TABLET BY MOUTH EVERY 8 HOURS AS NEEDED FOR NAUSEA AND VOMITING   promethazine  (PHENERGAN ) 25 MG tablet Take 1 tablet (25 mg total) by mouth every 6 (six) hours as needed for nausea or vomiting. 1/2-1 tablet po prn q6 hours   rosuvastatin  (CRESTOR ) 20 MG tablet TAKE 1 TABLET BY MOUTH EVERY DAY   venlafaxine  XR (EFFEXOR -XR) 75 MG 24 hr capsule Take 75 mg by mouth in the morning and at bedtime.   No facility-administered encounter medications on file as of 07/28/2023.  Allergies (verified) Percocet [oxycodone -acetaminophen ] and Lamictal [lamotrigine]   History: Past Medical History:  Diagnosis Date   Anxiety    Arthritis    hips and knees   Asthma    stress induced asthma   Chest pain 06/2019   Chronic kidney disease    ckd stage 1 per 5-26-201 dr Avanell Bob [facility] ov note   Depression    DM type 2 (diabetes mellitus, type 2) (HCC) dx jan 2021   Fibroid    uterine   GERD (gastroesophageal reflux disease)    Hepatic steatosis 12/05/2019   Hyperlipidemia    IBS (irritable bowel syndrome)    Liver nodule 1999   no follow up needed was due to stress   Morbid obesity (HCC)    Neuropathy    both fingers and toes at times   Obesity    Protein in urine 03/2019   Tubular adenoma of colon 01/2012   Past Surgical History:  Procedure  Laterality Date   COLONOSCOPY  09/2022   polyps, Dr. Sandrea Cruel, repeat 3 years   colonscopy  2017   COLPOSCOPY  1990   results were normal   CYSTOSCOPY  02/28/2020   Procedure: CYSTOSCOPY;  Surgeon: Lenord Radon, MD;  Location: [facility] [place];  Service: Gynecology;;   DILITATION & CURRETTAGE/HYSTROSCOPY WITH NOVASURE ABLATION N/A 08/23/2019   Procedure: DILATATION & CURETTAGE/DIAGNOSTIC HYSTEROSCOPY;  Surgeon: Lenord Radon, MD;  Location: [facility] [place];  Service: Gynecology;  Laterality: N/A;   IR GENERIC HISTORICAL  03/21/2016   IR EMBO TUMOR ORGAN ISCHEMIA INFARCT INC GUIDE ROADMAPPING 03/21/2016 Marland Silvas, MD [facility]   IR GENERIC HISTORICAL  03/21/2016   IR US  GUIDE VASC ACCESS RIGHT 03/21/2016 Marland Silvas, MD [facility]   IR GENERIC HISTORICAL  03/21/2016   IR ANGIOGRAM SELECTIVE EACH ADDITIONAL VESSEL 03/21/2016 Marland Silvas, MD [facility]   IR GENERIC HISTORICAL  03/21/2016   IR ANGIOGRAM SELECTIVE EACH ADDITIONAL VESSEL 03/21/2016 Marland Silvas, MD [facility]   IR GENERIC HISTORICAL  03/21/2016   IR ANGIOGRAM PELVIS SELECTIVE OR SUPRASELECTIVE 03/21/2016 Marland Silvas, MD [facility]   IR GENERIC HISTORICAL  03/21/2016   IR ANGIOGRAM PELVIS SELECTIVE OR SUPRASELECTIVE 03/21/2016 Marland Silvas, MD [facility]   IR GENERIC HISTORICAL  02/27/2016   IR RADIOLOGIST EVAL & MGMT 02/27/2016 Marland Silvas, MD [facility]   IR GENERIC HISTORICAL  04/03/2016   IR RADIOLOGIST EVAL & MGMT 04/03/2016 Marland Silvas, MD [facility]   IR RADIOLOGIST EVAL & MGMT  08/26/2016   labial cyst removal     ROBOTIC ASSISTED TOTAL HYSTERECTOMY Bilateral 02/28/2020   Procedure: XI ROBOTIC ASSISTED TOTAL HYSTERECTOMY AND BILATERAL SALPINGECTOMY;  Surgeon: Lenord Radon, MD;  Location: [facility] [place];  Service: Gynecology;  Laterality: Bilateral;   TUBAL LIGATION     20 years ago   UPPER  GASTROINTESTINAL ENDOSCOPY     Family History  Problem Relation Age of Onset   Diabetes Father    Diabetes Mother    Hypertension Mother    Deep vein thrombosis Mother    Cancer - Other Mother        peritoneal cancer   Hiatal hernia Mother    Colon cancer Maternal Grandmother    Colon polyps Maternal Grandmother    Hypertension Sister    Deep vein thrombosis Sister    Diabetes Sister    Hiatal hernia Sister    Rectal cancer Neg Hx    Stomach cancer Neg Hx  Pancreatic cancer Neg Hx    Kidney disease Neg Hx    Liver disease Neg Hx    Social History   Socioeconomic History   Marital status: Divorced    Spouse name: Not on file   Number of children: 2   Years of education: Not on file   Highest education level: Some college, no degree  Occupational History   Not on file  Tobacco Use   Smoking status: Never   Smokeless tobacco: Never  Vaping Use   Vaping status: Never Used  Substance and Sexual Activity   Alcohol use: Not Currently    Comment: occ, not weekly   Drug use: No   Sexual activity: Yes  Other Topics Concern   Not on file  Social History Narrative   Lives alone.  Unemployed.  On disability for anxiety.   Son and daughter and 3 sisters live in [place].  Walking for exercise.   10/2022.   Social Drivers of Corporate investment banker Strain: Low Risk  (07/28/2023)   Overall Financial Resource Strain (CARDIA)    Difficulty of Paying Living Expenses: Not hard at all  Recent Concern: Financial Resource Strain - Medium Risk (05/10/2023)   Overall Financial Resource Strain (CARDIA)    Difficulty of Paying Living Expenses: Somewhat hard  Food Insecurity: No Food Insecurity (07/28/2023)   Hunger Vital Sign    Worried About Running Out of Food in the Last Year: Never true    Ran Out of Food in the Last Year: Never true  Transportation Needs: No Transportation Needs (07/28/2023)   PRAPARE - Administrator, Civil Service (Medical): No    Lack of  Transportation (Non-Medical): No  Physical Activity: Inactive (07/28/2023)   Exercise Vital Sign    Days of Exercise per Week: 0 days    Minutes of Exercise per Session: 0 min  Stress: Stress Concern Present (07/28/2023)   [employer] of Occupational Health - Occupational Stress Questionnaire    Feeling of Stress : Rather much  Social Connections: Moderately Isolated (07/28/2023)   Social Connection and Isolation Panel [NHANES]    Frequency of Communication with Friends and Family: Three times a week    Frequency of Social Gatherings with Friends and Family: Once a week    Attends Religious Services: More than 4 times per year    Active Member of [employer] or Organizations: No    Attends Engineer, structural: Never    Marital Status: Divorced    Tobacco Counseling Counseling given: Not Answered    Clinical Intake:  Pre-visit preparation completed: Yes  Pain : 0-10 Pain Score: 7  Pain Type: Chronic pain Pain Location: Knee Pain Orientation: Right Pain Descriptors / Indicators: Aching Pain Onset: More than a month ago Pain Frequency: Constant     Nutritional Status: BMI > 30  Obese Nutritional Risks: Nausea/ vomitting/ diarrhea (nausea on Sunday) Diabetes: Yes CBG done?: No Did pt. bring in CBG monitor from home?: No  Lab Results  Component Value Date   HGBA1C 6.2 (H) 03/10/2023   HGBA1C 6.1 (H) 09/16/2022   HGBA1C 6.1 (A) 10/23/2021     How often do you need to have someone help you when you read instructions, pamphlets, or other written materials from your doctor or pharmacy?: 1 - Never  Interpreter Needed?: No  Information entered by :: NAllen LPN   Activities of Daily Living     07/28/2023   11:35 AM  In your present state  of health, do you have any difficulty performing the following activities:  Hearing? 1  Comment trouble hearing right ear  Vision? 0  Difficulty concentrating or making decisions? 1  Comment sometimes indecisive  Walking or  climbing stairs? 1  Comment due to knee  Dressing or bathing? 0  Doing errands, shopping? 0  Preparing Food and eating ? N  Using the Toilet? N  In the past six months, have you accidently leaked urine? N  Do you have problems with loss of bowel control? N  Managing your Medications? N  Managing your Finances? N  Housekeeping or managing your Housekeeping? N    Patient Care Team: [name], Christiane Cowing, PA-C as PCP - General (Family Medicine) Nan Aver, MD as Consulting Physician (Nephrology) Alver Austin, [facility] (Inactive) as Pharmacist (Pharmacist)  Indicate any recent Medical Services you may have received from other than [facility] providers in the past year (date may be approximate).     Assessment:   This is a routine wellness examination for Alizae.  Hearing/Vision screen Hearing Screening - Comments:: Slight decrease in right ear Vision Screening - Comments:: Regular eye exams, [facility]   Goals Addressed             This Visit's Progress    Patient Stated       07/28/2023, wants to eat healthier and start exercising more       Depression Screen     07/28/2023   11:46 AM 07/22/2022   12:14 PM 01/03/2022   10:14 AM 10/23/2021    4:01 PM 07/12/2021   11:57 AM 06/19/2021   11:05 AM 04/15/2021    3:15 PM  PHQ 2/9 Scores  PHQ - 2 Score 0 0 5 0 0 0 1  PHQ- 9 Score 6 3 15  4  8     Fall Risk     07/28/2023   11:45 AM 07/22/2022   12:14 PM 10/23/2021    4:01 PM 07/12/2021   11:56 AM 06/19/2021   11:05 AM  Fall Risk   Falls in the past year? 0 0 1 0 0  Comment    fell off curb twice   Number falls in past yr: 0 0 0 1 0  Injury with Fall? 0 0 0 0 0  Risk for fall due to : Medication side effect Medication side effect Impaired balance/gait Medication side effect No Fall Risks  Follow up Falls prevention discussed;Falls evaluation completed Falls prevention discussed;Education provided;Falls evaluation completed Falls evaluation completed Falls evaluation  completed;Education provided;Falls prevention discussed Falls evaluation completed    MEDICARE RISK AT HOME:  Medicare Risk at Home Any stairs in or around the home?: Yes (has a ramp) If so, are there any without handrails?: No Home free of loose throw rugs in walkways, pet beds, electrical cords, etc?: Yes Adequate lighting in your home to reduce risk of falls?: Yes Life alert?: No Use of a cane, walker or w/c?: No Grab bars in the bathroom?: Yes Shower chair or bench in shower?: Yes Elevated toilet seat or a handicapped toilet?: Yes  TIMED UP AND GO:  Was the test performed?  Yes  Length of time to ambulate 10 feet: 5 sec Gait slow and steady without use of assistive device  Cognitive Function: 6CIT completed        07/28/2023   11:48 AM 07/22/2022   12:19 PM 07/12/2021   12:02 PM  6CIT Screen  What Year? 0 points 0 points 0 points  What month? 0 points 0 points 0 points  What time? 0 points 0 points 0 points  Count back from 20 0 points 0 points 0 points  Months in reverse 0 points 0 points 0 points  Repeat phrase 0 points 2 points 2 points  Total Score 0 points 2 points 2 points    Immunizations Immunization History  Administered Date(s) Administered   Hepb-cpg 07/24/2021, 10/23/2021   Influenza Whole 02/06/2009   Influenza,inj,Quad PF,6+ Mos 01/28/2017, 02/02/2018   PFIZER(Purple Top)SARS-COV-2 Vaccination 07/07/2019, 07/28/2019   Td 08/01/2005   Tdap 07/29/2021    Screening Tests Health Maintenance  Topic Date Due   Pneumococcal Vaccine 89-49 Years old (1 of 2 - PCV) Never done   Zoster Vaccines- Shingrix (1 of 2) Never done   OPHTHALMOLOGY EXAM  01/11/2023   Diabetic kidney evaluation - Urine ACR  08/20/2023   HEMOGLOBIN A1C  09/08/2023   FOOT EXAM  09/16/2023   INFLUENZA VACCINE  10/23/2023   Diabetic kidney evaluation - eGFR measurement  03/09/2024   Medicare Annual Wellness (AWV)  07/27/2024   MAMMOGRAM  03/10/2025   Colonoscopy  10/19/2025    DTaP/Tdap/Td (3 - Td or Tdap) 07/30/2031   Hepatitis C Screening  Completed   HIV Screening  Completed   HPV VACCINES  Aged Out   Meningococcal B Vaccine  Aged Out   COVID-19 Vaccine  Discontinued    Health Maintenance  Health Maintenance Due  Topic Date Due   Pneumococcal Vaccine 32-52 Years old (1 of 2 - PCV) Never done   Zoster Vaccines- Shingrix (1 of 2) Never done   OPHTHALMOLOGY EXAM  01/11/2023   Diabetic kidney evaluation - Urine ACR  08/20/2023   Health Maintenance Items Addressed: Declines vaccines  Additional Screening:  Vision Screening: Recommended annual ophthalmology exams for early detection of glaucoma and other disorders of the eye.  Dental Screening: Recommended annual dental exams for proper oral hygiene  Community Resource Referral / Chronic Care Management: CRR required this visit?  No   CCM required this visit?  No     Plan:     I have personally reviewed and noted the following in the patient's chart:   Medical and social history Use of alcohol, tobacco or illicit drugs  Current medications and supplements including opioid prescriptions. Patient is not currently taking opioid prescriptions. Functional ability and status Nutritional status Physical activity Advanced directives List of other physicians Hospitalizations, surgeries, and ER visits in previous 12 months Vitals Screenings to include cognitive, depression, and falls Referrals and appointments  In addition, I have reviewed and discussed with patient certain preventive protocols, quality metrics, and best practice recommendations. A written personalized care plan for preventive services as well as general preventive health recommendations were provided to patient.     Areatha Beecham, LPN   09/29/2954   After Visit Summary: (In Person-Printed) AVS printed and given to the patient  Notes: Nothing significant to report at this time.

## 2023-07-31 ENCOUNTER — Ambulatory Visit: Payer: Self-pay

## 2023-07-31 NOTE — Telephone Encounter (Signed)
 Copied from CRM [id number]. Topic: Clinical - Red Word Triage >> Jul 31, 2023 11:23 AM Carlatta H wrote: [facility] that prompted transfer to Nurse Triage: Right knee swelling and pain//Patient stated pain is severe and shoots up leg//   Chief Complaint: Knee pain Symptoms: Right knee pain and swelling Frequency: Constant  Pertinent Negatives: Patient denies chest pain or shortness of breath  Disposition: [] ED /[] Urgent Care (no appt availability in office) / [x] Appointment(In office/virtual)/ []  [place] [facility]/ [] Home Care/ [] Refused Recommended Disposition /[] [place] [facility]/ []  Follow-up with PCP Additional Notes: Patient reports a dull ache to her right knee for the last 4-5 days. She states her pain has been constant and radiates up her thigh and down her leg. She denies any known injury, chest pain, or shortness of breath. Appointment made for the patient on Tuesday. Patient instructed to call back for new or worsening symptoms. Patient verbalized understanding and agreement with this plan.    Reason for Disposition  [1] MODERATE pain (e.g., interferes with normal activities, limping) AND [2] present > 3 days  Answer Assessment - Initial Assessment Questions 1. LOCATION and RADIATION: "Where is the pain located?"      Back of right knee knee radiating up thigh and down leg  2. QUALITY: "What does the pain feel like?"  (e.g., sharp, dull, aching, burning)     Dull ache 3. SEVERITY: "How bad is the pain?" "What does it keep you from doing?"   (Scale 1-10; or mild, moderate, severe)   -  MILD (1-3): doesn't interfere with normal activities    -  MODERATE (4-7): interferes with normal activities (e.g., work or school) or awakens from sleep, limping    -  SEVERE (8-10): excruciating pain, unable to do any normal activities, unable to walk     8/10 4. ONSET: "When did the pain start?" "Does it come and go, or is it there all the time?"     4-5 days ago  5. RECURRENT: "Have you  had this pain before?" If Yes, ask: "When, and what happened then?"     Yes 6. SETTING: "Has there been any recent work, exercise or other activity that involved that part of the body?"      No 7. AGGRAVATING FACTORS: "What makes the knee pain worse?" (e.g., walking, climbing stairs, running)     Walking  8. ASSOCIATED SYMPTOMS: "Is there any swelling or redness of the knee?"     Some swelling 9. OTHER SYMPTOMS: "Do you have any other symptoms?" (e.g., chest pain, difficulty breathing, fever, calf pain)     No  Protocols used: Knee Pain-A-AH

## 2023-08-04 ENCOUNTER — Ambulatory Visit (INDEPENDENT_AMBULATORY_CARE_PROVIDER_SITE_OTHER): Admitting: Medical

## 2023-08-04 ENCOUNTER — Ambulatory Visit
Admission: RE | Admit: 2023-08-04 | Discharge: 2023-08-04 | Disposition: A | Discharge status: Home / Self Care | Source: Ambulatory Visit | Attending: Medical | Admitting: Medical

## 2023-08-04 VITALS — BP 122/90 | HR 102 | Wt 231.4 lb

## 2023-08-04 DIAGNOSIS — M1711 Unilateral primary osteoarthritis, right knee: Secondary | ICD-10-CM | POA: Diagnosis not present

## 2023-08-04 DIAGNOSIS — M25561 Pain in right knee: Secondary | ICD-10-CM

## 2023-08-04 DIAGNOSIS — G8929 Other chronic pain: Secondary | ICD-10-CM

## 2023-08-04 DIAGNOSIS — M25511 Pain in right shoulder: Secondary | ICD-10-CM | POA: Diagnosis not present

## 2023-08-04 DIAGNOSIS — T148XXA Other injury of unspecified body region, initial encounter: Secondary | ICD-10-CM

## 2023-08-04 DIAGNOSIS — M25461 Effusion, right knee: Secondary | ICD-10-CM

## 2023-08-04 NOTE — Patient Instructions (Addendum)
 Your exam suggests strain of the adductor muscles and possible MCL sprain.   Use over the counter Ibuporfen 200mg , 3 tablets twice or 3 times daily this week  Referral placed to physical therapy    What to Know  The MCL is a strong tissue inside your knee. It's one of the ligaments that connects the top of the shin bone (tibia) to the bottom of the thigh bone (femur). Your MCL is located along your inner knee and stops your knee from moving too far inward. It helps to keep your knee stable. An MCL sprain is a stretch or tear in the MCL. What are the causes? A hard, direct hit to the outside of your knee. Your knee falling inward when you run, jump, pivot, or change directions quickly, also called cutting. Repeatedly overstretching the MCL. What increases the risk? Playing contact sports, such as wrestling or football. Playing sports that involve sudden movements of cutting, twisting, or turning. These include hockey, skiing, and soccer. Having weak hip and core muscles. What are the signs or symptoms? A feeling like a pop or a snap in your knee. Hearing a noise, like a pop or a snap. Pain on the inside of the knee. Swelling in the knee. Bruising around the knee. Tenderness. Feeling unstable when you stand, like your knee will give way. Trouble walking on uneven surfaces. How is this diagnosed? An MCL sprain may be diagnosed based on: Your medical history. A physical exam. Imaging tests, such as an X-ray, ultrasound, or MRI. How is this treated? Treatment for an MCL sprain depends on how bad the injury is. Treatment may include: Keeping weight off the knee until swelling and pain improve. Icing the knee. Taking medicines such as ibuprofen . This helps to reduce pain and swelling. Using a knee brace, elastic sleeve, or crutches while the injury heals. Using a knee brace when doing athletic activities. Doing rehab exercises, also called physical therapy. Surgery. This may be  needed only if: Your MCL tore all the way through. Your knee is unstable. Your knee is not getting better with other treatments. Follow these instructions at home: If you have a brace or sleeve that can be taken off: Wear the brace or sleeve as told. Take the brace or sleeve off only if your health care provider says you can. Check the skin around it every day. Tell your provider if you see problems. Loosen the brace or remove the sleeve if your toes tingle, are numb, or turn cold and blue. Keep the brace or sleeve clean. If the brace or sleeve isn't waterproof: Do not let it get wet. Cover it when you take a bath or shower. Use a cover that doesn't let any water in. Managing pain, stiffness, and swelling  Use ice or an ice pack as told. If you have a brace or sleeve that you can take off, remove it only as told. Place a towel between your skin and the ice or between your brace and the ice. Leave the ice on for 20 minutes, 2-3 times a day. If your skin turns red, take off the ice right away to prevent skin damage. The risk of damage is higher if you can't feel pain, heat, or cold. Move your foot and toes often to reduce stiffness and swelling. Raise your knee above the level of your heart while you're sitting or lying down. Use pillows as needed. Activity Ask when it's safe to drive if you have a brace or sleeve  on your leg. Do not stand or walk on your injured leg until you're told it's OK. Use crutches. Ask what things are safe for you to do at home. Ask when you can go back to work or school. Exercise as told. General instructions Take your medicines only as told. Do not smoke, vape, or use nicotine or tobacco. Keep all follow-up visits. Your provider will check your healing. How is this prevented? Warm up and stretch before being active. Cool down and stretch after being active. Give your body time to rest. Stay fit. Keep your body strong and flexible. Contact a health care  provider if: Your symptoms do not improve. Your symptoms get worse. This information is not intended to replace advice given to you by your health care provider. Make sure you discuss any questions you have with your health care provider. Document Revised: 08/18/2022 Document Reviewed: 08/18/2022 Elsevier Patient Education  2024 [employer]     Medial Collateral Ligament Sprain, Phase I Rehab Ask your health care provider which exercises are safe for you. Do exercises exactly as told by your provider and adjust them as told. It's normal to feel mild stretching, pulling, tightness, or discomfort as you do these exercises. Stop right away if you feel sudden pain or your pain gets worse. Do not begin these exercises until told by your provider. Stretching and range-of-motion exercises These exercises warm up your muscles and joints and improve the movement and flexibility of your knee. These exercises also help relieve pain. Knee flexion, passive  Start this exercise in one of these positions: Lying on the floor in front of an open doorway with your left / right heel and foot lightly touching higher up on the wall. Lying on the floor with both feet on the wall. Without using any effort (passive), allow gravity to let your foot slide down the wall slowly (flexion) until you feel a gentle stretch in the front of your left / right knee. Hold this stretch for 8 seconds. Return your leg to the starting position, using your healthy leg to do the work or to help if needed. Repeat 3 times. Complete this exercise 2 times a day. Knee flexion, active  Lie on your back with both legs straight. If this causes back discomfort, bend your healthy knee so your foot is flat on the floor. With your own effort (active), slowly slide your left / right heel back toward your butt (flexion). Stop when you feel a gentle stretch in the front of your knee or thigh. Hold this stretch for 8 seconds. Return your leg  to the starting position, using your healthy leg to do the work or to help if needed. Repeat 3 times. Complete this exercise 2 times a day.  Knee extension, sitting  Sit with your left / right heel propped up on a chair, a coffee table, or a footstool. Do not have anything under your knee to support it. Allow your leg muscles to relax, letting gravity straighten out your knee (extension). Do not let your knee roll inward. You should feel a stretch behind your left / right knee. Hold this stretch for 8 seconds. Return your leg to the starting position, using your healthy leg to do the work or to help if needed. Repeat 3 times. Complete this exercise 2 times a day.  Strengthening exercises These exercises build strength and endurance in your knee. Endurance is the ability to use your muscles for a long time, even after they  normally get tired. Isometric exercises involve squeezing your muscles without moving your knee joint. Quadriceps, isometric  Lie on your back with your left / right leg extended and your other knee bent. If told by your provider, put a rolled towel or small pillow under your left / right knee. Slowly tense the muscles in the front or top of your left / right thigh (quadriceps). You should see your kneecap slide up toward your hip or see increased dimpling just above the knee. This motion will push the back of your knee toward the floor. Hold this stretch for 8 seconds. Return your leg to the starting position, using your healthy leg to do the work or to help if needed. Repeat 3 times. Complete this exercise 2 times a day. Hamstring, isometric  Lie on your back on a firm surface. Bend your left / right knee about __________ degrees. You can prop your knee on a pillow if needed. Dig your left / right heel down and back into the surface as if you're trying to pull your heel toward your butt. Tighten the muscles in the back of your thighs (hamstring) to dig as hard as you can  without increasing any pain. Hold this stretch for 8 seconds. Return your leg to the starting position, using your healthy leg to do the work or to help if needed. Repeat 3 times. Complete this exercise 2 times a day.  This information is not intended to replace advice given to you by your health care provider. Make sure you discuss any questions you have with your health care provider. Document Revised: 06/12/2022 Document Reviewed: 06/12/2022 Elsevier Patient Education  2024 [employer].

## 2023-08-04 NOTE — Progress Notes (Signed)
 Subjective:  [name] is a 63 y.o. female who presents for Chief Complaint  Patient presents with   Acute Visit    Right knee pain X 1.5 wks, no injury that she is aware of. Her knee is swollen. She has applied heat and ice it does help but the pain comes back and radiates up the leftside of leg to groin/hip area and down to the foot. She has also been wearing a knee brace       Here for right knee pain x 1.5 weeks.  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  she does have to get in and out of the [place] multiple times a day.  She notes last week after having pain and swelling particularly in the medial side of the knee.  No specific injury or trauma or fall.  She has tried Salonpas, Biofreeze, ice, elevation, over-the-counter analgesics.  No real improvement so far  She also has chronic right shoulder pain for months but no recent change in that issue.  No other aggravating or relieving factors.    No other c/o.  The following portions of the patient's history were reviewed and updated as appropriate: allergies, current medications, past family history, past medical history, past social history, past surgical history and problem list.  ROS Otherwise as in subjective above  Objective: BP (!) 122/90   Pulse (!) 102   Wt 231 lb 6.4 oz (105 kg)   LMP 06/23/2014   SpO2 96%   BMI 36.24 kg/m   General appearance: alert, no distress, well developed, well nourished MSK tender over the right medial thigh, right medial knee over the MCL, and she has pain with abduction and flexion of the right thigh.  There is slight puffiness of the anterior knee.  Otherwise knee and leg nontender with relatively normal range of motion.  No bruising or discoloration. Rest of legs unremarkable Legs neurovascularly intact Pulses: 2+ radial pulses, 2+ pedal pulses, normal cap refill Ext: no edema     Assessment: Encounter Diagnoses  Name Primary?   Right knee pain, unspecified  chronicity Yes   Pain and swelling of right knee    Chronic right shoulder pain    Muscle strain      Plan: We discussed using rest, alternating ice and heat therapy, knee sleeve, and can continue over-the-counter NSAID twice daily for the next 5 to 7 days, can use the topical Biofreeze as needed.  We discussed some stretches and isometric exercises she can do at home but also we will go ahead and refer to physical therapy.  She requested any x-ray.  I reviewed the back of her 2021 knee x-ray as well.  Order placed today for updated x-ray.  Symptoms suggest more of a adductor muscle and groin muscle strain as well as MCL ligament sprain so I doubt any type of bony fracture  We will also have her do some PT related to her chronic shoulder pain  Julicia was seen today for acute visit.  Diagnoses and all orders for this visit:  Right knee pain, unspecified chronicity -     Ambulatory referral to Physical Therapy -     DG Knee Complete 4 Views Right; Future  Pain and swelling of right knee -     Ambulatory referral to Physical Therapy -     DG Knee Complete 4 Views Right; Future  Chronic right shoulder pain -     Ambulatory referral to Physical  Therapy  Muscle strain -     Ambulatory referral to Physical Therapy -     DG Knee Complete 4 Views Right; Future    Follow up: pending xray and PT referral

## 2023-08-07 DIAGNOSIS — S83411A Sprain of medial collateral ligament of right knee, initial encounter: Secondary | ICD-10-CM | POA: Diagnosis not present

## 2023-08-11 ENCOUNTER — Ambulatory Visit: Payer: Self-pay | Admitting: Medical

## 2023-08-11 ENCOUNTER — Other Ambulatory Visit: Payer: Self-pay | Admitting: Medical

## 2023-08-11 DIAGNOSIS — M2392 Unspecified internal derangement of left knee: Secondary | ICD-10-CM

## 2023-08-11 DIAGNOSIS — M25461 Effusion, right knee: Secondary | ICD-10-CM

## 2023-08-11 DIAGNOSIS — M25562 Pain in left knee: Secondary | ICD-10-CM

## 2023-08-11 DIAGNOSIS — M5416 Radiculopathy, lumbar region: Secondary | ICD-10-CM | POA: Diagnosis not present

## 2023-08-11 DIAGNOSIS — M6281 Muscle weakness (generalized): Secondary | ICD-10-CM | POA: Diagnosis not present

## 2023-08-11 DIAGNOSIS — S76211D Strain of adductor muscle, fascia and tendon of right thigh, subsequent encounter: Secondary | ICD-10-CM | POA: Diagnosis not present

## 2023-08-11 MED ORDER — TRAMADOL HCL 50 MG PO TABS: 50.0000 mg | ORAL_TABLET | Freq: Four times a day (QID) | ORAL | 0 refills | Status: DC | PRN

## 2023-08-11 MED ORDER — DICLOFENAC SODIUM 1 % EX GEL: 4.0000 g | Freq: Four times a day (QID) | CUTANEOUS | 0 refills | Status: DC

## 2023-08-11 NOTE — Progress Notes (Signed)
 Results sent through MyChart

## 2023-08-12 DIAGNOSIS — N181 Chronic kidney disease, stage 1: Secondary | ICD-10-CM | POA: Diagnosis not present

## 2023-08-14 ENCOUNTER — Ambulatory Visit: Payer: Self-pay

## 2023-08-14 NOTE — Telephone Encounter (Signed)
 Copied from CRM [id number]. Topic: Clinical - Red Word Triage >> Aug 14, 2023  4:29 PM Kevelyn M wrote: Red Word that prompted transfer to Nurse Triage: Patient injured her knee on May 5th and saw provider already. Provider told her to take ibuprofen . Her knee and ankle are swollen.  Chief Complaint: right knee and ankle pain and swelling  Symptoms: pain and swelling Frequency: constant when moving  Pertinent Negatives: Patient denies fever, numbness, tingling Disposition: [] ED /[] Urgent Care (no appt availability in office) / [x] Appointment(In office/virtual)/ []  [facility]/ [] Home Care/ [] Refused Recommended Disposition /[]  Mobile Bus/ []  Follow-up with PCP Additional Notes: apt made for when she comes back from [place] .  Is asking for another medication like a steroid to be called in to this pharmacy below.  She will be back in town next week. [facility] [address] [place], [place] [address]  Reason for Disposition  [1] MODERATE pain (e.g., interferes with normal activities, limping) AND [2] present > 3 days  Answer Assessment - Initial Assessment Questions 1. LOCATION and RADIATION: "Where is the pain located?"      Right knee and ankle 2. QUALITY: "What does the pain feel like?"  (e.g., sharp, dull, aching, burning)     Aching, sharp 3. SEVERITY: "How bad is the pain?" "What does it keep you from doing?"   (Scale 1-10; or mild, moderate, severe)   -  MILD (1-3): doesn't interfere with normal activities    -  MODERATE (4-7): interferes with normal activities (e.g., work or school) or awakens from sleep, limping    -  SEVERE (8-10): excruciating pain, unable to do any normal activities, unable to walk     severe 4. ONSET: "When did the pain start?" "Does it come and go, or is it there all the time?"     Had injury on May 5th. 5. RECURRENT: "Have you had this pain before?" If Yes, ask: "When, and what happened then?"     Yes, but this is worse than  the injury 6. SETTING: "Has there been any recent work, exercise or other activity that involved that part of the body?"      na 7. AGGRAVATING FACTORS: "What makes the knee pain worse?" (e.g., walking, climbing stairs, running)     Standing and walking 8. ASSOCIATED SYMPTOMS: "Is there any swelling or redness of the knee?"     swelling 9. OTHER SYMPTOMS: "Do you have any other symptoms?" (e.g., chest pain, difficulty breathing, fever, calf pain)     no 10. PREGNANCY: "Is there any chance you are pregnant?" "When was your last menstrual period?"       na  Protocols used: Knee Pain-A-AH

## 2023-08-19 DIAGNOSIS — M5416 Radiculopathy, lumbar region: Secondary | ICD-10-CM | POA: Diagnosis not present

## 2023-08-19 DIAGNOSIS — M6281 Muscle weakness (generalized): Secondary | ICD-10-CM | POA: Diagnosis not present

## 2023-08-19 DIAGNOSIS — S76211D Strain of adductor muscle, fascia and tendon of right thigh, subsequent encounter: Secondary | ICD-10-CM | POA: Diagnosis not present

## 2023-08-19 NOTE — Telephone Encounter (Signed)
 Left message for pt to call me back

## 2023-08-19 NOTE — Telephone Encounter (Signed)
 Pt was advised to reach back out to [facility] ortho

## 2023-08-20 ENCOUNTER — Ambulatory Visit: Admitting: Medical

## 2023-08-20 DIAGNOSIS — E1122 Type 2 diabetes mellitus with diabetic chronic kidney disease: Secondary | ICD-10-CM | POA: Diagnosis not present

## 2023-08-20 DIAGNOSIS — N181 Chronic kidney disease, stage 1: Secondary | ICD-10-CM | POA: Diagnosis not present

## 2023-08-20 DIAGNOSIS — I129 Hypertensive chronic kidney disease with stage 1 through stage 4 chronic kidney disease, or unspecified chronic kidney disease: Secondary | ICD-10-CM | POA: Diagnosis not present

## 2023-08-20 DIAGNOSIS — R809 Proteinuria, unspecified: Secondary | ICD-10-CM | POA: Diagnosis not present

## 2023-08-25 DIAGNOSIS — M25561 Pain in right knee: Secondary | ICD-10-CM | POA: Diagnosis not present

## 2023-08-26 DIAGNOSIS — M25561 Pain in right knee: Secondary | ICD-10-CM | POA: Diagnosis not present

## 2023-09-01 DIAGNOSIS — M25561 Pain in right knee: Secondary | ICD-10-CM | POA: Diagnosis not present

## 2023-09-02 DIAGNOSIS — M25561 Pain in right knee: Secondary | ICD-10-CM | POA: Diagnosis not present

## 2023-09-09 ENCOUNTER — Other Ambulatory Visit: Payer: Self-pay | Admitting: Medical

## 2023-10-08 ENCOUNTER — Other Ambulatory Visit: Payer: Self-pay | Admitting: Medical

## 2023-10-08 DIAGNOSIS — J4520 Mild intermittent asthma, uncomplicated: Secondary | ICD-10-CM

## 2023-10-08 DIAGNOSIS — K582 Mixed irritable bowel syndrome: Secondary | ICD-10-CM

## 2023-10-15 ENCOUNTER — Encounter: Payer: Self-pay | Admitting: Medical

## 2023-10-15 ENCOUNTER — Ambulatory Visit: Admitting: Medical

## 2023-10-15 ENCOUNTER — Ambulatory Visit: Payer: Self-pay | Admitting: *Deleted

## 2023-10-15 VITALS — BP 142/80 | HR 112 | Ht 66.0 in | Wt 232.2 lb

## 2023-10-15 DIAGNOSIS — R1032 Left lower quadrant pain: Secondary | ICD-10-CM | POA: Diagnosis not present

## 2023-10-15 DIAGNOSIS — J45909 Unspecified asthma, uncomplicated: Secondary | ICD-10-CM | POA: Diagnosis not present

## 2023-10-15 DIAGNOSIS — R000 Tachycardia, unspecified: Secondary | ICD-10-CM

## 2023-10-15 LAB — POCT URINALYSIS DIP (PROADVANTAGE DEVICE)
Bilirubin, UA: NEGATIVE
Blood, UA: NEGATIVE
Glucose, UA: NEGATIVE mg/dL
Ketones, POC UA: NEGATIVE mg/dL
Nitrite, UA: NEGATIVE
Protein Ur, POC: 30 mg/dL — AB
Specific Gravity, Urine: 1.005
Urobilinogen, Ur: 0.2
pH, UA: 6 (ref 5.0–8.0)

## 2023-10-15 MED ORDER — AMOXICILLIN-POT CLAVULANATE 875-125 MG PO TABS
1.0000 | ORAL_TABLET | Freq: Two times a day (BID) | ORAL | 0 refills | Status: DC
Start: 2023-10-15 — End: 2023-11-17

## 2023-10-15 MED ORDER — ATENOLOL 25 MG PO TABS
25.0000 mg | ORAL_TABLET | Freq: Every day | ORAL | 1 refills | Status: DC
Start: 2023-10-15 — End: 2023-11-06

## 2023-10-15 NOTE — Progress Notes (Signed)
 Subjective:  [name] is a 63 y.o. female who presents for Chief Complaint  Patient presents with   other    SOB since last Tuesday, Fatigue same time, LLQ pelvic pain since last night     Here for concerns.  Had some loose stool 2 days ago, used some imodium,  then next day constipation and bloating.   Then last night had some significant pain in LLQ last night.  Felt like cyst but she no longer has ovaries. Wonders if its gas.   Last bowel movement today, was ok.   No recent blood in stool. Had some chills last night.   Has had some nausea.   Using Zofran  for nausea.  No fever.  Has some increased belching.  Doesn't usually stay constipated more than a day.  In past sometimes gets lower pain, uses Azo and pain goes away.  Been feeling pain in LLQ in past week.  No current dysuria, odor in urine, blood in urine.   No vaginal discharge.     Last Tuesday was having some SOB.  Has been using inhaler 2-3 times per day in past week.  Worse at night. Walking to car SOB.   No specific palpitations, no chest pain.  But feels some pressure.   Went to outdoor concert in past 2 weeks, was around someone smoking and she was exposed to a big puff of smoke.  Since then thinks this flared up her asthma.    She notes chronic fast heart rate at rest over 100.   No other aggravating or relieving factors.    No other c/o.  Past Medical History:  Diagnosis Date   Anxiety    Arthritis    hips and knees   Asthma    stress induced asthma   Chest pain 06/2019   Chronic kidney disease    ckd stage 1 per 5-26-201 dr katheryn foster ov note   Depression    DM type 2 (diabetes mellitus, type 2) (HCC) dx jan 2021   Fibroid    uterine   GERD (gastroesophageal reflux disease)    Hepatic steatosis 12/05/2019   Hyperlipidemia    IBS (irritable bowel syndrome)    Liver nodule 1999   no follow up needed was due to stress   Morbid obesity (HCC)    Neuropathy    both fingers and toes at times   Obesity     Protein in urine 03/2019   Tubular adenoma of colon 01/2012   Current Outpatient Medications on File Prior to Visit  Medication Sig Dispense Refill   albuterol  (VENTOLIN  HFA) 108 (90 Base) MCG/ACT inhaler INHALE 2 PUFFS INTO THE LUNGS EVERY 6 HOURS AS NEEDED FOR WHEEZE 18 each 2   aspirin  81 MG tablet Take 81 mg by mouth daily.     b complex vitamins capsule Take 1 capsule by mouth daily.     Cholecalciferol (VITAMIN D ) 50 MCG (2000 UT) CAPS Take 1 capsule (2,000 Units total) by mouth daily. 90 capsule 3   clonazePAM  (KLONOPIN ) 0.5 MG tablet Take 0.5 mg by mouth daily as needed.     fluticasone  (FLONASE ) 50 MCG/ACT nasal spray SPRAY 2 SPRAYS INTO EACH NOSTRIL EVERY DAY 48 mL 0   gabapentin  (NEURONTIN ) 100 MG capsule Take 100 mg by mouth 3 (three) times daily.     Glucose Blood (BLOOD GLUCOSE TEST STRIPS) STRP Test once a day. Pt has a onetouch verio flex meter dx e11.9 100 strip 1  Lancets (ONETOUCH ULTRASOFT) lancets Test once a day. Dx E11.9 pt has onetouch verio flex meter 100 each 1   losartan  (COZAAR ) 25 MG tablet Take 1 tablet (25 mg total) by mouth daily. 90 tablet 2   magnesium  gluconate (MAGONATE) 500 MG tablet Take 500 mg by mouth 2 (two) times daily.     meclizine  (ANTIVERT ) 25 MG tablet Take 1 tablet (25 mg total) by mouth 2 (two) times daily as needed for dizziness. 30 tablet 1   omeprazole  (PRILOSEC) 40 MG capsule TAKE 1 CAPSULE BY MOUTH TWICE A DAY 180 capsule 1   ondansetron  (ZOFRAN ) 4 MG tablet TAKE 1 TABLET BY MOUTH EVERY 8 HOURS AS NEEDED FOR NAUSEA AND VOMITING 30 tablet 0   promethazine  (PHENERGAN ) 25 MG tablet Take 1 tablet (25 mg total) by mouth every 6 (six) hours as needed for nausea or vomiting. 1/2-1 tablet po prn q6 hours 12 tablet 0   rosuvastatin  (CRESTOR ) 20 MG tablet TAKE 1 TABLET BY MOUTH EVERY DAY 90 tablet 1   venlafaxine  XR (EFFEXOR -XR) 75 MG 24 hr capsule Take 75 mg by mouth in the morning and at bedtime.     diclofenac  Sodium (VOLTAREN ) 1 % GEL Apply 4  g topically 4 (four) times daily. (Patient not taking: Reported on 10/15/2023) 200 g 0   hyoscyamine  (LEVBID ) 0.375 MG 12 hr tablet TAKE 1 TABLET (0.375 MG TOTAL) BY MOUTH EVERY 12 (TWELVE) HOURS AS NEEDED. (Patient not taking: Reported on 10/15/2023) 180 tablet 1   No current facility-administered medications on file prior to visit.   Past Surgical History:  Procedure Laterality Date   COLONOSCOPY  09/2022   polyps, Dr. Aneita, repeat 3 years   colonscopy  2017   COLPOSCOPY  1990   results were normal   CYSTOSCOPY  02/28/2020   Procedure: CYSTOSCOPY;  Surgeon: Corene Coy, MD;  [facility]: [facility];  Service: Gynecology;;   DILITATION & CURRETTAGE/HYSTROSCOPY WITH NOVASURE ABLATION N/A 08/23/2019   Procedure: DILATATION & CURETTAGE/DIAGNOSTIC HYSTEROSCOPY;  Surgeon: Corene Coy, MD;  Location: [facility];  Service: Gynecology;  Laterality: N/A;   IR GENERIC HISTORICAL  03/21/2016   IR EMBO TUMOR ORGAN ISCHEMIA INFARCT INC GUIDE ROADMAPPING 03/21/2016 Toribio Faes, MD [facility]   IR GENERIC HISTORICAL  03/21/2016   IR US  GUIDE VASC ACCESS RIGHT 03/21/2016 Toribio Faes, MD [facility]   IR GENERIC HISTORICAL  03/21/2016   IR ANGIOGRAM SELECTIVE EACH ADDITIONAL VESSEL 03/21/2016 Toribio Faes, MD [facility]   IR GENERIC HISTORICAL  03/21/2016   IR ANGIOGRAM SELECTIVE EACH ADDITIONAL VESSEL 03/21/2016 Toribio Faes, MD [facility]   IR GENERIC HISTORICAL  03/21/2016   IR ANGIOGRAM PELVIS SELECTIVE OR SUPRASELECTIVE 03/21/2016 Toribio Faes, MD [facility]   IR GENERIC HISTORICAL  03/21/2016   IR ANGIOGRAM PELVIS SELECTIVE OR SUPRASELECTIVE 03/21/2016 Toribio Faes, MD [facility]   IR GENERIC HISTORICAL  02/27/2016   IR RADIOLOGIST EVAL & MGMT 02/27/2016 Toribio Faes, MD [facility]   IR GENERIC HISTORICAL  04/03/2016   IR RADIOLOGIST EVAL & MGMT 04/03/2016 Toribio Faes, MD [facility]   IR  RADIOLOGIST EVAL & MGMT  08/26/2016   labial cyst removal     ROBOTIC ASSISTED TOTAL HYSTERECTOMY Bilateral 02/28/2020   Procedure: XI ROBOTIC ASSISTED TOTAL HYSTERECTOMY AND BILATERAL SALPINGECTOMY;  Surgeon: Corene Coy, MD;  Location: [facility];  Service: Gynecology;  Laterality: Bilateral;   TUBAL LIGATION     20 years ago   UPPER  GASTROINTESTINAL ENDOSCOPY      The following portions of the patient's history were reviewed and updated as appropriate: allergies, current medications, past family history, past medical history, past social history, past surgical history and problem list.  ROS Otherwise as in subjective above    Objective: BP (!) 142/80   Pulse (!) 112   Ht 5' 6 (1.676 m)   Wt 232 lb 3.2 oz (105.3 kg)   LMP 06/23/2014   SpO2 97%   BMI 37.48 kg/m   Wt Readings from Last 3 Encounters:  10/15/23 232 lb 3.2 oz (105.3 kg)  08/04/23 231 lb 6.4 oz (105 kg)  07/28/23 228 lb 12.8 oz (103.8 kg)   General appearance: alert, no distress, well developed, well nourished Neck: supple, no lymphadenopathy, no thyromegaly, no masses, no JVD Heart: tachycardiac otherwise RRR, normal S1, S2, no murmurs Lungs: CTA bilaterally, no wheezes, rhonchi, or rales Abdomen: +bs, soft, RUQ and LLQ tenderness, otherwise non tender, non distended, no masses, no hepatomegaly, no splenomegaly Pulses: 2+ radial pulses, 2+ pedal pulses, normal cap refill Ext: no edema    Assessment: Encounter Diagnoses  Name Primary?   LLQ pain Yes   Tachycardia    Uncomplicated asthma, unspecified asthma severity, unspecified whether persistent      Plan: Left lower quadrant pain-we discussed possible differential.  Of note last year she had a colonoscopy that did show some diverticula.  We discussed the possibility of diverticulitis.  Labs today.  She declines CT scan for now.  Advised no solid food intake for the next 24 to 48 hours.  Use clear fluids only.  I did  prescribe Augmentin .  If her symptoms worsen such as fever, worse pain, loose stool or diarrhea then start the Augmentin  antibiotic.  Can use the medicine she has at home for nausea , Zofran  or Phenergan .  If worse over the next few days, then go to the emergency department.  She has had elevated heart rate for years per her recollection.  I reviewed blood work she has had within the past year including blood counts, electrolytes, thyroid .  Begin atenolol  to lower pulse rate.  Discussed risk and benefits and proper use of medication.  She will monitor blood pressure and pulse at home.  Recheck in 2 weeks  Asthma-continue albuterol  as needed.  If any worse in the next few days then call back   Cyriah was seen today for other.  Diagnoses and all orders for this visit:  LLQ pain -     CBC with Differential/Platelet -     Comprehensive metabolic panel with GFR -     Cancel: Urinalysis, Routine w reflex microscopic -     POCT Urinalysis DIP (Proadvantage Device)  Tachycardia  Uncomplicated asthma, unspecified asthma severity, unspecified whether persistent  Other orders -     amoxicillin -clavulanate (AUGMENTIN ) 875-125 MG tablet; Take 1 tablet by mouth 2 (two) times daily. -     atenolol  (TENORMIN ) 25 MG tablet; Take 1 tablet (25 mg total) by mouth daily.   Follow up: pending labs

## 2023-10-15 NOTE — Telephone Encounter (Signed)
 Copied from CRM [id number]. Topic: Clinical - Red Word Triage >> Oct 15, 2023  1:43 PM [name] wrote: Red Word that prompted transfer to Nurse Triage: Patient is calling to report pain like cyst on ovaries. Patient has no ovaries no uterus. Pain started yesterday.   Other symptoms include shortness of breath starting last month with And fatigue. Using inhaler.   And right knee pain seen in office pain 08/04/23 seeing orthro. Injection last month with swelling. Reason for Disposition  [1] MODERATE longstanding difficulty breathing (e.g., speaks in phrases, SOB even at rest, pulse 100-120) AND [2] SAME as normal  Answer Assessment - Initial Assessment Questions 1. RESPIRATORY STATUS: Describe your breathing? (e.g., wheezing, shortness of breath, unable to speak, severe coughing)      I noticed last week I have no energy and I'm using my inhaler.   It did help.   One day I walked from parking lot to my building and I was short of breath.   I'm using it 2-3 times a day.     I called today because I was constipated and my stomach was bloated.   I put a heating pad on my stomach due to the pain in left lower groin area.   I'm in a lot of pain.   I'm having knee pain and swelling again.   I see ortho for that.    2. ONSET: When did this breathing problem begin?      Over the last month ago. 3. PATTERN Does the difficult breathing come and go, or has it been constant since it started?      With exertion 4. SEVERITY: How bad is your breathing? (e.g., mild, moderate, severe)      Short of breath with exertion 5. RECURRENT SYMPTOM: Have you had difficulty breathing before? If Yes, ask: When was the last time? and What happened that time?      In the past 6. CARDIAC HISTORY: Do you have any history of heart disease? (e.g., heart attack, angina, bypass surgery, angioplasty)      Not asked 7. LUNG HISTORY: Do you have any history of lung disease?  (e.g., pulmonary embolus, asthma,  emphysema)     I think I have asthma 8. CAUSE: What do you think is causing the breathing problem?      I'm not sure why I'm hurting in my pelvis and short of breath 9. OTHER SYMPTOMS: Do you have any other symptoms? (e.g., chest pain, cough, dizziness, fever, runny nose)     See aobve 10. O2 SATURATION MONITOR:  Do you use an oxygen saturation monitor (pulse oximeter) at home? If Yes, ask: What is your reading (oxygen level) today? What is your usual oxygen saturation reading? (e.g., 95%)       Not asked 11. PREGNANCY: Is there any chance you are pregnant? When was your last menstrual period?       Not asked 12. TRAVEL: Have you traveled out of the country in the last month? (e.g., travel history, exposures)       N/A  Protocols used: Breathing Difficulty-A-AH    FYI Only or Action Required?: FYI only for provider.  Patient was last seen in primary care on 08/04/2023 by Monica Alm RAMAN, PA-C.  Called Nurse Triage reporting Shortness of Breath. And LLQ pelvic pain.  Does not have a uterus or ovaries.   Fatigue.  Symptoms began about a month ago.  Interventions attempted: Nothing. Using her albuterol  inhaler 2-3 times a  day.  SOB with exertion   Symptoms are: gradually worsening.  Triage Disposition: See PCP When Office is Open (Within 3 Days)  Patient/caregiver understands and will follow disposition?: Yes

## 2023-10-16 ENCOUNTER — Ambulatory Visit: Payer: Self-pay | Admitting: Medical

## 2023-10-16 LAB — COMPREHENSIVE METABOLIC PANEL WITH GFR
ALT: 10 IU/L (ref 0–32)
AST: 14 IU/L (ref 0–40)
Albumin: 4.6 g/dL (ref 3.9–4.9)
Alkaline Phosphatase: 101 IU/L (ref 44–121)
BUN/Creatinine Ratio: 12 (ref 12–28)
BUN: 10 mg/dL (ref 8–27)
Bilirubin Total: 0.3 mg/dL (ref 0.0–1.2)
CO2: 22 mmol/L (ref 20–29)
Calcium: 9.6 mg/dL (ref 8.7–10.3)
Chloride: 100 mmol/L (ref 96–106)
Creatinine, Ser: 0.84 mg/dL (ref 0.57–1.00)
Globulin, Total: 3.1 g/dL (ref 1.5–4.5)
Glucose: 108 mg/dL — AB (ref 70–99)
Potassium: 3.8 mmol/L (ref 3.5–5.2)
Sodium: 141 mmol/L (ref 134–144)
Total Protein: 7.7 g/dL (ref 6.0–8.5)
eGFR: 79 mL/min/1.73 (ref >59)

## 2023-10-16 LAB — CBC WITH DIFFERENTIAL/PLATELET
Basophils Absolute: 0 x10E3/uL (ref 0.0–0.2)
Basos: 0 %
EOS (ABSOLUTE): 0.1 x10E3/uL (ref 0.0–0.4)
Eos: 1 %
Hematocrit: 39 % (ref 34.0–46.6)
Hemoglobin: 13 g/dL (ref 11.1–15.9)
Immature Grans (Abs): 0 x10E3/uL (ref 0.0–0.1)
Immature Granulocytes: 0 %
Lymphocytes Absolute: 3.2 x10E3/uL — ABNORMAL HIGH (ref 0.7–3.1)
Lymphs: 29 %
MCH: 25.5 pg — ABNORMAL LOW (ref 26.6–33.0)
MCHC: 33.3 g/dL (ref 31.5–35.7)
MCV: 77 fL — ABNORMAL LOW (ref 79–97)
Monocytes Absolute: 0.7 x10E3/uL (ref 0.1–0.9)
Monocytes: 7 %
Neutrophils Absolute: 6.9 x10E3/uL (ref 1.4–7.0)
Neutrophils: 63 %
Platelets: 266 x10E3/uL (ref 150–450)
RBC: 5.09 x10E6/uL (ref 3.77–5.28)
RDW: 13.6 % (ref 11.7–15.4)
WBC: 11 x10E3/uL — ABNORMAL HIGH (ref 3.4–10.8)

## 2023-10-16 NOTE — Progress Notes (Signed)
 Results sent through MyChart  Send a copy of my note and labs to [facility]

## 2023-10-21 ENCOUNTER — Other Ambulatory Visit: Payer: Self-pay | Admitting: Medical

## 2023-10-21 ENCOUNTER — Telehealth: Payer: Self-pay | Admitting: Internal Medicine

## 2023-10-21 DIAGNOSIS — R1032 Left lower quadrant pain: Secondary | ICD-10-CM

## 2023-10-21 NOTE — Telephone Encounter (Signed)
 Copied from CRM [id number]. Topic: Clinical - Lab/Test Results >> Oct 21, 2023  3:22 PM Carlatta H wrote: Reason for CRM: Please call the patient about moving forward with CT Scan//She is still having stomach discomfort, some shortness of breath and fatigue//

## 2023-10-21 NOTE — Telephone Encounter (Signed)
 Pt was notified.

## 2023-10-30 ENCOUNTER — Telehealth: Payer: Self-pay

## 2023-10-30 NOTE — Telephone Encounter (Signed)
 Copied from CRM [id number]. Topic: Clinical - Medical Advice >> Oct 30, 2023  1:42 PM Carlatta H wrote: Reason for CRM: Patient would like a medication call in for Yeast infection due to antibiotics//Please call to advise//Pharmacy in chart is correct

## 2023-11-02 ENCOUNTER — Other Ambulatory Visit

## 2023-11-02 ENCOUNTER — Other Ambulatory Visit: Payer: Self-pay | Admitting: Medical

## 2023-11-02 ENCOUNTER — Ambulatory Visit
Admission: RE | Admit: 2023-11-02 | Discharge: 2023-11-02 | Disposition: A | Discharge status: Home / Self Care | Source: Ambulatory Visit | Attending: Medical | Admitting: Medical

## 2023-11-02 DIAGNOSIS — R1032 Left lower quadrant pain: Secondary | ICD-10-CM

## 2023-11-02 MED ORDER — FLUCONAZOLE 100 MG PO TABS: 100.0000 mg | ORAL_TABLET | Freq: Every day | ORAL | 0 refills | Status: DC

## 2023-11-02 MED ORDER — IOPAMIDOL (ISOVUE-300) INJECTION 61%
100.0000 mL | Freq: Once | INTRAVENOUS | Status: AC | PRN
  Administered 2023-11-02 (×2): 100 mL via INTRAVENOUS

## 2023-11-03 ENCOUNTER — Ambulatory Visit: Payer: Self-pay | Admitting: Medical

## 2023-11-03 ENCOUNTER — Other Ambulatory Visit: Payer: Self-pay | Admitting: Medical

## 2023-11-03 NOTE — Progress Notes (Signed)
 Ct abdomen pelvis was normal.   Fortunately no mass or obvious abnormality Consider follow-up with gastroenterology

## 2023-11-04 DIAGNOSIS — M25561 Pain in right knee: Secondary | ICD-10-CM | POA: Diagnosis not present

## 2023-11-04 DIAGNOSIS — S83241A Other tear of medial meniscus, current injury, right knee, initial encounter: Secondary | ICD-10-CM | POA: Diagnosis not present

## 2023-11-05 ENCOUNTER — Telehealth: Payer: Self-pay | Admitting: Medical

## 2023-11-05 NOTE — Telephone Encounter (Signed)
 Pt scheduled for Monday 8/25  Please return form to front

## 2023-11-06 ENCOUNTER — Other Ambulatory Visit: Payer: Self-pay | Admitting: Medical

## 2023-11-09 ENCOUNTER — Encounter: Payer: Self-pay | Admitting: Medical

## 2023-11-09 ENCOUNTER — Ambulatory Visit (INDEPENDENT_AMBULATORY_CARE_PROVIDER_SITE_OTHER): Admitting: Medical

## 2023-11-09 VITALS — BP 146/92 | HR 107 | Ht 67.0 in | Wt 231.0 lb

## 2023-11-09 DIAGNOSIS — I10 Essential (primary) hypertension: Secondary | ICD-10-CM | POA: Diagnosis not present

## 2023-11-09 DIAGNOSIS — E1165 Type 2 diabetes mellitus with hyperglycemia: Secondary | ICD-10-CM

## 2023-11-09 DIAGNOSIS — Z01818 Encounter for other preprocedural examination: Secondary | ICD-10-CM

## 2023-11-09 DIAGNOSIS — R000 Tachycardia, unspecified: Secondary | ICD-10-CM | POA: Diagnosis not present

## 2023-11-09 DIAGNOSIS — J4520 Mild intermittent asthma, uncomplicated: Secondary | ICD-10-CM

## 2023-11-09 LAB — POCT GLYCOSYLATED HEMOGLOBIN (HGB A1C): Hemoglobin A1C: 6.7 % — AB (ref 4.0–5.6)

## 2023-11-09 NOTE — Progress Notes (Signed)
 Subjective:  [name] is a 63 y.o. female who presents for Chief Complaint  Patient presents with   Follow-up    Medical Clearance     Here for surgery preop.  She is planning to have right knee surgery soon with [facility]  I saw her recently 10/15/23 for diarrhea and abdominal issues.  At that visit her pulse and blood pressure was still elevated.  She was on losartan  so we added atenolol .  However she has not started the Atenolol  yet.    She denies chest pain, palpitations.  No edema.  No prior problems with surgery or anesthesia.  Diabetes- No current issues.  Diet controlled.  He is a non-smoker.  She checks her blood pressures occasionally.  Blood pressure has been little bit elevated.  She has chronic nausea and recent problems with diarrhea and abdominal discomfort.  Improved since stopping ozempic , but things some recent coffee she had at a restaurant didn't sit well on her stomach.  She has history of asthma-using albuterol  as needed.   No other aggravating or relieving factors.    No other c/o.  Past Medical History:  Diagnosis Date   Anxiety    Arthritis    hips and knees   Asthma    stress induced asthma   Chest pain 06/2019   Chronic kidney disease    ckd stage 1 per 5-26-201 dr katheryn foster ov note   Depression    DM type 2 (diabetes mellitus, type 2) (HCC) dx jan 2021   Fibroid    uterine   GERD (gastroesophageal reflux disease)    Hepatic steatosis 12/05/2019   Hyperlipidemia    IBS (irritable bowel syndrome)    Liver nodule 1999   no follow up needed was due to stress   Morbid obesity (HCC)    Neuropathy    both fingers and toes at times   Obesity    Protein in urine 03/2019   Tubular adenoma of colon 01/2012   Current Outpatient Medications on File Prior to Visit  Medication Sig Dispense Refill   albuterol  (VENTOLIN  HFA) 108 (90 Base) MCG/ACT inhaler INHALE 2 PUFFS INTO THE LUNGS EVERY 6 HOURS AS NEEDED FOR WHEEZE 18  each 2   aspirin  81 MG tablet Take 81 mg by mouth daily.     atenolol  (TENORMIN ) 25 MG tablet TAKE 1 TABLET (25 MG TOTAL) BY MOUTH DAILY. 90 tablet 0   b complex vitamins capsule Take 1 capsule by mouth daily.     Cholecalciferol (VITAMIN D ) 50 MCG (2000 UT) CAPS Take 1 capsule (2,000 Units total) by mouth daily. 90 capsule 3   clonazePAM  (KLONOPIN ) 0.5 MG tablet Take 0.5 mg by mouth daily as needed.     fluconazole  (DIFLUCAN ) 100 MG tablet Take 1 tablet (100 mg total) by mouth daily. 7 tablet 0   gabapentin  (NEURONTIN ) 100 MG capsule Take 100 mg by mouth 3 (three) times daily.     Glucose Blood (BLOOD GLUCOSE TEST STRIPS) STRP Test once a day. Pt has a onetouch verio flex meter dx e11.9 100 strip 1   losartan  (COZAAR ) 25 MG tablet Take 1 tablet (25 mg total) by mouth daily. 90 tablet 2   magnesium  gluconate (MAGONATE) 500 MG tablet Take 500 mg by mouth 2 (two) times daily.     meclizine  (ANTIVERT ) 25 MG tablet Take 1 tablet (25 mg total) by mouth 2 (two) times daily as needed for dizziness. 30 tablet 1   meloxicam (  MOBIC) 15 MG tablet 15 mg.     omeprazole  (PRILOSEC) 40 MG capsule TAKE 1 CAPSULE BY MOUTH TWICE A DAY 180 capsule 1   ondansetron  (ZOFRAN ) 4 MG tablet TAKE 1 TABLET BY MOUTH EVERY 8 HOURS AS NEEDED FOR NAUSEA AND VOMITING 30 tablet 0   promethazine  (PHENERGAN ) 25 MG tablet TAKE 1/2 OR 1 TABLET BY MOUTH EVERY 6 (SIX) HOURS AS NEEDED FOR NAUSEA OR VOMITING. 12 tablet 0   rosuvastatin  (CRESTOR ) 20 MG tablet TAKE 1 TABLET BY MOUTH EVERY DAY 90 tablet 1   venlafaxine  XR (EFFEXOR -XR) 75 MG 24 hr capsule Take 75 mg by mouth in the morning and at bedtime.     amoxicillin -clavulanate (AUGMENTIN ) 875-125 MG tablet Take 1 tablet by mouth 2 (two) times daily. (Patient not taking: Reported on 11/09/2023) 20 tablet 0   diclofenac  Sodium (VOLTAREN ) 1 % GEL Apply 4 g topically 4 (four) times daily. (Patient not taking: Reported on 11/09/2023) 200 g 0   fluticasone  (FLONASE ) 50 MCG/ACT nasal spray  SPRAY 2 SPRAYS INTO EACH NOSTRIL EVERY DAY (Patient not taking: Reported on 11/09/2023) 48 mL 0   hyoscyamine  (LEVBID ) 0.375 MG 12 hr tablet TAKE 1 TABLET (0.375 MG TOTAL) BY MOUTH EVERY 12 (TWELVE) HOURS AS NEEDED. (Patient not taking: Reported on 11/09/2023) 180 tablet 1   Lancets (ONETOUCH ULTRASOFT) lancets Test once a day. Dx E11.9 pt has onetouch verio flex meter 100 each 1   No current facility-administered medications on file prior to visit.     The following portions of the patient's history were reviewed and updated as appropriate: allergies, current medications, past family history, past medical history, past social history, past surgical history and problem list.  ROS Otherwise as in subjective above  Objective: BP (!) 146/92 (BP Location: Right Arm, Patient Position: Sitting)   Pulse (!) 107   Ht 5' 7 (1.702 m)   Wt 231 lb (104.8 kg)   LMP 06/23/2014   SpO2 98%   BMI 36.18 kg/m   General appearance: alert, no distress, well developed, well nourished Oral cavity: MMM, no lesions Neck: supple, no lymphadenopathy, no thyromegaly, no masses Heart: RRR, normal S1, S2, no murmurs Lungs: CTA bilaterally, no wheezes, rhonchi, or rales Abdomen: +bs, soft, non tender, non distended, no masses, no hepatomegaly, no splenomegaly Pulses: 2+ radial pulses, 2+ pedal pulses, normal cap refill Ext: no edema   EKG reviewed Unchanged from 2022 EKG     Assessment: Encounter Diagnoses  Name Primary?   Preop examination Yes   Type 2 diabetes mellitus with hyperglycemia, without long-term current use of insulin (HCC)    Mild intermittent asthma without complication    Essential hypertension, benign    Elevated pulse rate      Plan: Preop examination today.  I reviewed recent lab work in the computer from her last visit and other blood work from the last year  Asthma-continue albuterol  as needed.  Mild intermittent asthma.  Diabetes-diet controlled.  Hemoglobin A1c today 6.7  %.  Was on Ozempic  until recently she was having abdominal pain and diarrhea.  She will remain off medicine for the time being.  Hypertension-continue losartan  25 mg daily.  Last visit I added atenolol  but she has not started this yet.  I reiterated the need to start the atenolol .  Elevated pulse rate-she needs to start atenolol  as we discussed last visit.  Begin atenolol   I advise she check her blood sugars, check home pulse and blood pressure as well.  Asked  her to give me a call or come back in in 1 week to recheck blood pressure and pulse.  Surgery clearance will be dependent upon blood pressure and pulse at goal, blood pressure less than 130/80, pulse less than 100 resting  We discussed her EKG findings.  She has an abnormal EKG with prolonged QT and questionable LVH criteria.  But this is unchanged from prior EKG several years ago.  We discussed possibly seeing cardiology moving forward or getting a baseline echocardiogram.  This does not have to be before surgery clearance but we talked about considering this moving forward.  She will let me know how she wants to move forward  [name] was seen today for follow-up.  Diagnoses and all orders for this visit:  Preop examination -     EKG 12-Lead  Type 2 diabetes mellitus with hyperglycemia, without long-term current use of insulin (HCC) -     HgB A1c  Mild intermittent asthma without complication  Essential hypertension, benign  Elevated pulse rate   Follow up: 1 wk

## 2023-11-12 ENCOUNTER — Telehealth: Payer: Self-pay

## 2023-11-12 NOTE — Telephone Encounter (Unsigned)
 Copied from CRM #[id number]. Topic: Clinical - Medication Question >> Nov 12, 2023  3:23 PM Fonda T wrote: Reason for CRM: Received call from patient, states she has been possibly exposed to COVID, by a friend's daughter who tested positive for COVID. Most recent contact was on 11/10/23.  Patient states she has no current symptoms at present, other than achy legs, and some chills. Patient reports she has not taken COVID test.   Patient just seen in office on 11/09/23.   Patient declined to speak to triage. Patient inquiring if medication can be sent to pharmacy before being any other aggressive symptoms start.  Can be reached for follow up at [phone].  Preferred pharmacy:  [id number] [address], [place] - [address] AT [facility] [address] [place] [address] Phone: [phone] Fax: [phone]

## 2023-11-13 ENCOUNTER — Other Ambulatory Visit: Payer: Self-pay | Admitting: Medical

## 2023-11-13 MED ORDER — PAXLOVID (300/100) 20 X 150 MG & 10 X 100MG PO TBPK: 3.0000 | ORAL_TABLET | Freq: Two times a day (BID) | ORAL | 0 refills | Status: DC

## 2023-11-17 ENCOUNTER — Ambulatory Visit (INDEPENDENT_AMBULATORY_CARE_PROVIDER_SITE_OTHER): Payer: 59 | Admitting: Medical

## 2023-11-17 VITALS — BP 132/78 | HR 82 | Ht 67.25 in | Wt 234.0 lb

## 2023-11-17 DIAGNOSIS — Z111 Encounter for screening for respiratory tuberculosis: Secondary | ICD-10-CM

## 2023-11-17 DIAGNOSIS — I10 Essential (primary) hypertension: Secondary | ICD-10-CM | POA: Diagnosis not present

## 2023-11-17 DIAGNOSIS — R809 Proteinuria, unspecified: Secondary | ICD-10-CM | POA: Diagnosis not present

## 2023-11-17 DIAGNOSIS — K760 Fatty (change of) liver, not elsewhere classified: Secondary | ICD-10-CM

## 2023-11-17 DIAGNOSIS — Z0000 Encounter for general adult medical examination without abnormal findings: Secondary | ICD-10-CM

## 2023-11-17 DIAGNOSIS — K582 Mixed irritable bowel syndrome: Secondary | ICD-10-CM

## 2023-11-17 DIAGNOSIS — E1169 Type 2 diabetes mellitus with other specified complication: Secondary | ICD-10-CM

## 2023-11-17 DIAGNOSIS — E1165 Type 2 diabetes mellitus with hyperglycemia: Secondary | ICD-10-CM | POA: Diagnosis not present

## 2023-11-17 DIAGNOSIS — J4520 Mild intermittent asthma, uncomplicated: Secondary | ICD-10-CM | POA: Diagnosis not present

## 2023-11-17 DIAGNOSIS — E559 Vitamin D deficiency, unspecified: Secondary | ICD-10-CM | POA: Diagnosis not present

## 2023-11-17 DIAGNOSIS — K219 Gastro-esophageal reflux disease without esophagitis: Secondary | ICD-10-CM

## 2023-11-17 DIAGNOSIS — F319 Bipolar disorder, unspecified: Secondary | ICD-10-CM

## 2023-11-17 DIAGNOSIS — F419 Anxiety disorder, unspecified: Secondary | ICD-10-CM

## 2023-11-17 DIAGNOSIS — F32A Depression, unspecified: Secondary | ICD-10-CM

## 2023-11-17 DIAGNOSIS — Z7185 Encounter for immunization safety counseling: Secondary | ICD-10-CM

## 2023-11-17 DIAGNOSIS — E785 Hyperlipidemia, unspecified: Secondary | ICD-10-CM | POA: Diagnosis not present

## 2023-11-17 MED ORDER — FLUTICASONE PROPIONATE 50 MCG/ACT NA SUSP: NASAL | 1 refills | Status: AC

## 2023-11-17 MED ORDER — ONDANSETRON HCL 4 MG PO TABS
ORAL_TABLET | ORAL | 0 refills | Status: AC
Start: 2023-11-17 — End: ?

## 2023-11-17 MED ORDER — HYOSCYAMINE SULFATE ER 0.375 MG PO TB12: 0.3750 mg | ORAL_TABLET | Freq: Two times a day (BID) | ORAL | 1 refills | Status: AC | PRN

## 2023-11-17 NOTE — Progress Notes (Signed)
 Subjective:   HPI  [name] is a 63 y.o. female who presents for Chief Complaint  Patient presents with   Annual Exam    Fasting cpe,     Patient Care Team: Kashtyn Jankowski, Alm RAMAN, PA-C as PCP - General (Family Medicine) Jerrye Katheryn BROCKS, MD as Consulting Physician (Nephrology) Liane Sharyne MATSU, [facility] (Inactive) as Pharmacist (Pharmacist) Sees dentist Dr. Gwendlyn Buddy, GI Leverne Satchel, optometry Dr. Donnice Lima, general surgery Dr. Elveria Mungo, og/gyn Dr. Katheryn Jerrye, nephrology, [facility] Dr. Camelia Mountain, psychiatry   Concerns: Here for well visit.  She is having right knee surgery very soon.  Needs surgery clearance.  Blood pressure has been looking better since last visit.  She reports compliance with her medication  Reviewed their medical, surgical, family, social, medication, and allergy history and updated chart as appropriate.  Past Medical History:  Diagnosis Date   Anxiety    Arthritis    hips and knees   Asthma    stress induced asthma   Chest pain 06/2019   Chronic kidney disease    ckd stage 1 per [id number] dr katheryn foster ov note   Depression    DM type 2 (diabetes mellitus, type 2) (HCC) dx jan 2021   Fibroid    uterine   GERD (gastroesophageal reflux disease)    Hepatic steatosis 12/05/2019   Hyperlipidemia    IBS (irritable bowel syndrome)    Liver nodule 1999   no follow up needed was due to stress   Morbid obesity (HCC)    Neuropathy    both fingers and toes at times   Obesity    Protein in urine 03/2019   Tubular adenoma of colon 01/2012    Family History  Problem Relation Age of Onset   Diabetes Father    Diabetes Mother    Hypertension Mother    Deep vein thrombosis Mother    Cancer - Other Mother        peritoneal cancer   Hiatal hernia Mother    Colon cancer Maternal Grandmother    Colon polyps Maternal Grandmother    Hypertension Sister    Deep vein thrombosis Sister    Diabetes Sister     Hiatal hernia Sister    Rectal cancer Neg Hx    Stomach cancer Neg Hx    Pancreatic cancer Neg Hx    Kidney disease Neg Hx    Liver disease Neg Hx      Current Outpatient Medications:    albuterol  (VENTOLIN  HFA) 108 (90 Base) MCG/ACT inhaler, INHALE 2 PUFFS INTO THE LUNGS EVERY 6 HOURS AS NEEDED FOR WHEEZE, Disp: 18 each, Rfl: 2   aspirin  81 MG tablet, Take 81 mg by mouth daily., Disp: , Rfl:    atenolol  (TENORMIN ) 25 MG tablet, TAKE 1 TABLET (25 MG TOTAL) BY MOUTH DAILY., Disp: 90 tablet, Rfl: 0   b complex vitamins capsule, Take 1 capsule by mouth daily., Disp: , Rfl:    Cholecalciferol (VITAMIN D ) 50 MCG (2000 UT) CAPS, Take 1 capsule (2,000 Units total) by mouth daily., Disp: 90 capsule, Rfl: 3   clonazePAM  (KLONOPIN ) 0.5 MG tablet, Take 0.5 mg by mouth daily as needed., Disp: , Rfl:    gabapentin  (NEURONTIN ) 100 MG capsule, Take 100 mg by mouth 3 (three) times daily., Disp: , Rfl:    losartan  (COZAAR ) 25 MG tablet, Take 1 tablet (25 mg total) by mouth daily., Disp: 90 tablet, Rfl: 2   magnesium  gluconate (MAGONATE) 500  MG tablet, Take 500 mg by mouth 2 (two) times daily., Disp: , Rfl:    meclizine  (ANTIVERT ) 25 MG tablet, Take 1 tablet (25 mg total) by mouth 2 (two) times daily as needed for dizziness., Disp: 30 tablet, Rfl: 1   meloxicam (MOBIC) 15 MG tablet, 15 mg., Disp: , Rfl:    omeprazole  (PRILOSEC) 40 MG capsule, TAKE 1 CAPSULE BY MOUTH TWICE A DAY, Disp: 180 capsule, Rfl: 1   promethazine  (PHENERGAN ) 25 MG tablet, TAKE 1/2 OR 1 TABLET BY MOUTH EVERY 6 (SIX) HOURS AS NEEDED FOR NAUSEA OR VOMITING., Disp: 12 tablet, Rfl: 0   rosuvastatin  (CRESTOR ) 20 MG tablet, TAKE 1 TABLET BY MOUTH EVERY DAY, Disp: 90 tablet, Rfl: 1   venlafaxine  XR (EFFEXOR -XR) 75 MG 24 hr capsule, Take 75 mg by mouth in the morning and at bedtime., Disp: , Rfl:    fluticasone  (FLONASE ) 50 MCG/ACT nasal spray, SPRAY 2 SPRAYS INTO EACH NOSTRIL EVERY DAY, Disp: 48 mL, Rfl: 1   Glucose Blood (BLOOD GLUCOSE TEST  STRIPS) STRP, Test once a day. Pt has a onetouch verio flex meter dx e11.9, Disp: 100 strip, Rfl: 1   hyoscyamine  (LEVBID ) 0.375 MG 12 hr tablet, Take 1 tablet (0.375 mg total) by mouth every 12 (twelve) hours as needed., Disp: 180 tablet, Rfl: 1   Lancets (ONETOUCH ULTRASOFT) lancets, Test once a day. Dx E11.9 pt has onetouch verio flex meter, Disp: 100 each, Rfl: 1   ondansetron  (ZOFRAN ) 4 MG tablet, TAKE 1 TABLET BY MOUTH EVERY 8 HOURS AS NEEDED FOR NAUSEA AND VOMITING, Disp: 30 tablet, Rfl: 0  Allergies  Allergen Reactions   Percocet [Oxycodone -Acetaminophen ] Itching   Lamictal [Lamotrigine] Rash    Review of Systems  Constitutional:  Negative for chills, fever, malaise/fatigue and weight loss.  HENT:  Negative for congestion, ear pain, hearing loss, sore throat and tinnitus.   Eyes:  Negative for blurred vision, pain and redness.  Respiratory:  Negative for cough, hemoptysis and shortness of breath.   Cardiovascular:  Negative for chest pain, palpitations, orthopnea, claudication and leg swelling.  Gastrointestinal:  Negative for abdominal pain, blood in stool, constipation, diarrhea, nausea and vomiting.  Genitourinary:  Negative for dysuria, flank pain, frequency, hematuria and urgency.  Musculoskeletal:  Positive for joint pain. Negative for falls and myalgias.  Skin:  Negative for itching and rash.  Neurological:  Negative for dizziness, tingling, speech change, weakness and headaches.  Endo/Heme/Allergies:  Negative for polydipsia. Does not bruise/bleed easily.  Psychiatric/Behavioral:  Negative for depression and memory loss. The patient is not nervous/anxious and does not have insomnia.         07/28/2023   11:46 AM 07/22/2022   12:14 PM 01/03/2022   10:14 AM 10/23/2021    4:01 PM 07/12/2021   11:57 AM  Depression screen PHQ 2/9  Decreased Interest 0 0 3 0 0  Down, Depressed, Hopeless 0 0 2 0 0  PHQ - 2 Score 0 0 5 0 0  Altered sleeping 2 3 2  3   Tired, decreased energy 3  0 3  1  Change in appetite 0 0 1  0  Feeling bad or failure about yourself  0 0 1  0  Trouble concentrating 1 0 3  0  Moving slowly or fidgety/restless 0 0 0  0  Suicidal thoughts 0 0 0  0  PHQ-9 Score 6 3 15  4   Difficult doing work/chores Somewhat difficult Not difficult at all Very difficult  Not difficult  at all       Objective:  BP 132/78   Pulse 82   Ht 5' 7.25 (1.708 m)   Wt 234 lb (106.1 kg)   LMP 06/23/2014   SpO2 99%   BMI 36.38 kg/m   Wt Readings from Last 3 Encounters:  11/17/23 234 lb (106.1 kg)  11/09/23 231 lb (104.8 kg)  10/15/23 232 lb 3.2 oz (105.3 kg)   BP Readings from Last 3 Encounters:  11/17/23 132/78  11/09/23 (!) 146/92  10/15/23 (!) 142/80    General appearance: alert, no distress, WD/WN, African American female Skin: unremarkable HEENT: normocephalic, conjunctiva/corneas normal, sclerae anicteric, PERRLA, EOMi, nares patent, no discharge or erythema, pharynx normal Oral cavity: MMM, tongue normal, teeth in good repair Neck: supple, no lymphadenopathy, no thyromegaly, no masses, normal ROM, no bruits Chest: non tender, normal shape and expansion Heart: RRR, normal S1, S2, no murmurs Lungs: CTA bilaterally, no wheezes, rhonchi, or rales Abdomen: +bs, soft, non tender, non distended, no masses, no hepatomegaly, no splenomegaly, no bruits Back: non tender, normal ROM, no scoliosis Musculoskeletal: right knee tender, bony arthritis changes noted, otherwise upper extremities non tender, no obvious deformity, normal ROM throughout, lower extremities non tender, no obvious deformity, normal ROM throughout Extremities: no edema, no cyanosis, no clubbing Pulses: 2+ symmetric, upper and lower extremities, normal cap refill Neurological: alert, oriented x 3, CN2-12 intact, strength normal upper extremities and lower extremities, sensation normal throughout, DTRs 2+ throughout, no cerebellar signs, gait normal Psychiatric: normal affect, behavior normal,  pleasant  Breast/gyn/rectal - deferred to gynecology     Assessment and Plan :   Encounter Diagnoses  Name Primary?   Encounter for health maintenance examination in adult Yes   Irritable bowel syndrome with both constipation and diarrhea    Hyperlipidemia associated with type 2 diabetes mellitus (HCC)    Microalbuminuria    Type 2 diabetes mellitus with hyperglycemia, without long-term current use of insulin (HCC)    Anxiety and depression    Bipolar affective disorder, remission status unspecified (HCC)    Essential hypertension, benign    Gastroesophageal reflux disease, unspecified whether esophagitis present    Hepatic steatosis    Mild intermittent asthma without complication    Vaccine counseling    Vitamin D  deficiency     This visit was a preventative care visit, also known as wellness visit or routine physical.   Topics typically include healthy lifestyle, diet, exercise, preventative care, vaccinations, sick and well care, proper use of emergency dept and after hours care, as well as other concerns.     Recommendations: Continue to return yearly for your annual wellness and preventative care visits.  This gives us  a chance to discuss healthy lifestyle, exercise, vaccinations, review your chart record, and perform screenings where appropriate.  I recommend you see your eye doctor yearly for routine vision care.  I recommend you see your dentist yearly for routine dental care including hygiene visits twice yearly.  See your gynecologist yearly for routine gynecological care.    Vaccination recommendations were reviewed Immunization History  Administered Date(s) Administered   Hepb-cpg 07/24/2021, 10/23/2021   Influenza Whole 02/06/2009   Influenza,inj,Quad PF,6+ Mos 01/28/2017, 02/02/2018   PFIZER(Purple Top)SARS-COV-2 Vaccination 07/07/2019, 07/28/2019   Td 08/01/2005   Tdap 07/29/2021    I recommend the following vaccines: Shingrix vaccine Prevnar 20  pneumonia vaccine Yearly flu shot   Screening for cancer: Colon cancer screening: I reviewed your colonoscopy on file that is up to date from  2024, repeat 5 years, tubular adenoma, Dr. Aneita  Breast cancer screening: You should perform a self breast exam monthly.   We reviewed recommendations for regular mammograms and breast cancer screening.  Cervical cancer screening: You are status post hysterectomy    Skin cancer screening: Check your skin regularly for new changes, growing lesions, or other lesions of concern Come in for evaluation if you have skin lesions of concern.  Lung cancer screening: If you have a greater than 20 pack year history of tobacco use, then you may qualify for lung cancer screening with a chest CT scan.   Please call your insurance company to inquire about coverage for this test.  We currently don't have screenings for other cancers besides breast, cervical, colon, and lung cancers.  If you have a strong family history of cancer or have other cancer screening concerns, please let me know.    Bone health: Get at least 150 minutes of aerobic exercise weekly Get weight bearing exercise at least once weekly Bone density test:  A bone density test is an imaging test that uses a type of X-ray to measure the amount of calcium  and other minerals in your bones. The test may be used to diagnose or screen you for a condition that causes weak or thin bones (osteoporosis), predict your risk for a broken bone (fracture), or determine how well your osteoporosis treatment is working. The bone density test is recommended for females 65 and older, or females or males <65 if certain risk factors such as thyroid  disease, long term use of steroids such as for asthma or rheumatological issues, vitamin D  deficiency, estrogen deficiency, family history of osteoporosis, self or family history of fragility fracture in first degree relative.  Your May 2022 bone density test was  normal    Heart health: Get at least 150 minutes of aerobic exercise weekly Limit alcohol It is important to maintain a healthy blood pressure and healthy cholesterol numbers  Heart disease screening: Screening for heart disease includes screening for blood pressure, fasting lipids, glucose/diabetes screening, BMI height to weight ratio, reviewed of smoking status, physical activity, and diet.    Goals include blood pressure 120/80 or less, maintaining a healthy lipid/cholesterol profile, preventing diabetes or keeping diabetes numbers under good control, not smoking or using tobacco products, exercising most days per week or at least 150 minutes per week of exercise, and eating healthy variety of fruits and vegetables, healthy oils, and avoiding unhealthy food choices like fried food, fast food, high sugar and high cholesterol foods.    Other tests may possibly include EKG test, CT coronary calcium  score, echocardiogram, exercise treadmill stress test.   CT coronary test 07/2019 with score of zero.  EKG 10/2023 reviewed    Medical care options: I recommend you continue to seek care here first for routine care.  We try really hard to have available appointments Monday through Friday daytime hours for sick visits, acute visits, and physicals.  Urgent care should be used for after hours and weekends for significant issues that cannot wait till the next day.  The emergency department should be used for significant potentially life-threatening emergencies.  The emergency department is expensive, can often have long wait times for less significant concerns, so try to utilize primary care, urgent care, or telemedicine when possible to avoid unnecessary trips to the emergency department.  Virtual visits and telemedicine have been introduced since the pandemic started in 2020, and can be convenient ways to receive medical care.  We offer virtual appointments as well to assist you in a variety of options  to seek medical care.   Advanced Directives: I recommend you consider completing a Health Care Power of Attorney and Living Will.   These documents respect your wishes and help alleviate burdens on your loved ones if you were to become terminally ill or be in a position to need those documents enforced.    You can complete Advanced Directives yourself, have them notarized, then have copies made for our office, for you and for anybody you feel should have them in safe keeping.  Or, you can have an attorney prepare these documents.   If you haven't updated your Last Will and Testament in a while, it may be worthwhile having an attorney prepare these documents together and save on some costs.      Separate significant issues discussed: Blood pressure much improved from last week.  Continue current medications atenolol  25 mg daily and losartan  25 mg daily  I reviewed recent blood work as she had elsewhere.  CBC from 11/04/2023 showed white blood cells improved from previous lab.  Hemoglobin and platelets okay.  Recent hemoglobin A1c 11/09/2023 6.7%  Continue rest of medications as usual.  I reviewed her recent blood work.  No recent problems with asthma.  No prior anesthesia issues.  After reviewing chart she is cleared for surgery.  Form will [facility] to [facility]  Keeanna was seen today for annual exam.  Diagnoses and all orders for this visit:  Encounter for health maintenance examination in adult -     Lipid panel -     Magnesium  -     Microalbumin/Creatinine Ratio, Urine  Irritable bowel syndrome with both constipation and diarrhea -     hyoscyamine  (LEVBID ) 0.375 MG 12 hr tablet; Take 1 tablet (0.375 mg total) by mouth every 12 (twelve) hours as needed.  Hyperlipidemia associated with type 2 diabetes mellitus (HCC) -     Lipid panel  Microalbuminuria  Type 2 diabetes mellitus with hyperglycemia, without long-term current use of insulin (HCC) -      Microalbumin/Creatinine Ratio, Urine  Anxiety and depression  Bipolar affective disorder, remission status unspecified (HCC)  Essential hypertension, benign  Gastroesophageal reflux disease, unspecified whether esophagitis present  Hepatic steatosis  Mild intermittent asthma without complication  Vaccine counseling  Vitamin D  deficiency  Other orders -     fluticasone  (FLONASE ) 50 MCG/ACT nasal spray; SPRAY 2 SPRAYS INTO EACH NOSTRIL EVERY DAY -     ondansetron  (ZOFRAN ) 4 MG tablet; TAKE 1 TABLET BY MOUTH EVERY 8 HOURS AS NEEDED FOR NAUSEA AND VOMITING    Follow-up pending labs, yearly for physical

## 2023-11-18 ENCOUNTER — Other Ambulatory Visit: Payer: Self-pay | Admitting: Medical

## 2023-11-18 ENCOUNTER — Ambulatory Visit: Payer: Self-pay | Admitting: Medical

## 2023-11-18 LAB — MICROALBUMIN / CREATININE URINE RATIO
Creatinine, Urine: 163.9 mg/dL
Microalb/Creat Ratio: 63 mg/g{creat} — AB (ref 0–29)
Microalbumin, Urine: 103.6 ug/mL

## 2023-11-18 LAB — LIPID PANEL
Chol/HDL Ratio: 3.2 ratio (ref 0.0–4.4)
Cholesterol, Total: 194 mg/dL (ref 100–199)
HDL: 61 mg/dL (ref >39)
LDL Chol Calc (NIH): 113 mg/dL — ABNORMAL HIGH (ref 0–99)
Triglycerides: 115 mg/dL (ref 0–149)
VLDL Cholesterol Cal: 20 mg/dL (ref 5–40)

## 2023-11-18 LAB — MAGNESIUM: Magnesium: 1.7 mg/dL (ref 1.6–2.3)

## 2023-11-18 NOTE — Progress Notes (Signed)
 Results to MyChart

## 2023-11-20 LAB — QUANTIFERON-TB GOLD PLUS
QuantiFERON Mitogen Value: >10 [IU]/mL
QuantiFERON Nil Value: 0.06 [IU]/mL
QuantiFERON TB1 Ag Value: 0.13 [IU]/mL
QuantiFERON TB2 Ag Value: 0.11 [IU]/mL

## 2023-11-22 ENCOUNTER — Other Ambulatory Visit: Payer: Self-pay | Admitting: Medical

## 2023-11-22 MED ORDER — LOSARTAN POTASSIUM 25 MG PO TABS: 25.0000 mg | ORAL_TABLET | Freq: Every day | ORAL | 2 refills | Status: AC

## 2023-11-22 NOTE — Progress Notes (Signed)
 Results thru my chart

## 2023-11-26 DIAGNOSIS — G8918 Other acute postprocedural pain: Secondary | ICD-10-CM | POA: Diagnosis not present

## 2023-11-26 DIAGNOSIS — S83271A Complex tear of lateral meniscus, current injury, right knee, initial encounter: Secondary | ICD-10-CM | POA: Diagnosis not present

## 2023-11-26 DIAGNOSIS — S83241A Other tear of medial meniscus, current injury, right knee, initial encounter: Secondary | ICD-10-CM | POA: Diagnosis not present

## 2023-11-26 DIAGNOSIS — S83231A Complex tear of medial meniscus, current injury, right knee, initial encounter: Secondary | ICD-10-CM | POA: Diagnosis not present

## 2023-11-26 DIAGNOSIS — M1711 Unilateral primary osteoarthritis, right knee: Secondary | ICD-10-CM | POA: Diagnosis not present

## 2023-11-26 DIAGNOSIS — S83281A Other tear of lateral meniscus, current injury, right knee, initial encounter: Secondary | ICD-10-CM | POA: Diagnosis not present

## 2023-11-26 DIAGNOSIS — Y999 Unspecified external cause status: Secondary | ICD-10-CM | POA: Diagnosis not present

## 2023-11-26 DIAGNOSIS — X58XXXA Exposure to other specified factors, initial encounter: Secondary | ICD-10-CM | POA: Diagnosis not present

## 2023-11-26 DIAGNOSIS — M94261 Chondromalacia, right knee: Secondary | ICD-10-CM | POA: Diagnosis not present

## 2023-11-30 DIAGNOSIS — S83231D Complex tear of medial meniscus, current injury, right knee, subsequent encounter: Secondary | ICD-10-CM | POA: Diagnosis not present

## 2023-12-07 DIAGNOSIS — S83231D Complex tear of medial meniscus, current injury, right knee, subsequent encounter: Secondary | ICD-10-CM | POA: Diagnosis not present

## 2023-12-09 DIAGNOSIS — S83231D Complex tear of medial meniscus, current injury, right knee, subsequent encounter: Secondary | ICD-10-CM | POA: Diagnosis not present

## 2023-12-10 ENCOUNTER — Encounter: Payer: Self-pay | Admitting: Orthopedic Surgery

## 2023-12-21 ENCOUNTER — Encounter: Admitting: Obstetrics and Gynecology

## 2023-12-31 NOTE — Progress Notes (Signed)
 63 y.o. [id number] female s/p RATLH, BSO (2021, fibroids) here for new patient exam. Divorced. Disability for bipolar disorder, GAD, panic attacks. 2 children, 4 grandchildren. Fam hx notable for DVT and maternal peritoneal cancer.  Patient's last menstrual period was 06/23/2014.   She reports doing well. Notes VMS and often feel emotional. In trauma therapy weekly. She was having for vaginal itching but stopped exfoliating and symptoms resolved. Stopped wegovy  80yr ago and had gained her weight back She had Right knee surgery - 11/26/2023.  Urine sample provided: no  Last mammogram: 03/11/23 density b, birads 1 neg PAP: 01/2018  Colon cancer screening: 2024  DXA: 07/2020 normal  Sexually active: no  Exercise: Yes, sometimes walking  Tobacco Use: No   Flowsheet Row Office Visit from 01/04/2024 in [facility]  PHQ-2 Total Score 0    GYN HISTORY: RATLH, fibroids, 2021  OB History  Gravida Para Term Preterm AB Living  4 2 2  0 2 2  SAB IAB Ectopic Multiple Live Births  2 0   2    # Outcome Date GA Lbr Len/2nd Weight Sex Type Anes PTL Lv  4 Term 74 [redacted]w[redacted]d   F Vag-Spont None N LIV  3 Term 66 [redacted]w[redacted]d   M Vag-Spont EPI N LIV  2 SAB           1 SAB            Past Medical History:  Diagnosis Date   Anxiety    Arthritis    hips and knees   Asthma    stress induced asthma   Chest pain 06/2019   Chronic kidney disease    ckd stage 1 per 5-26-201 dr katheryn foster ov note   Depression    DM type 2 (diabetes mellitus, type 2) (HCC) dx jan 2021   Fibroid    uterine   GERD (gastroesophageal reflux disease)    Hepatic steatosis 12/05/2019   Hyperlipidemia    IBS (irritable bowel syndrome)    Liver nodule 1999   no follow up needed was due to stress   Morbid obesity (HCC)    Neuropathy    both fingers and toes at times   Obesity    Protein in urine 03/2019   Tubular adenoma of colon 01/2012   Past Surgical History:  Procedure Laterality  Date   COLONOSCOPY  09/2022   polyps, Dr. Aneita, repeat 3 years   colonscopy  2017   COLPOSCOPY  1990   results were normal   CYSTOSCOPY  02/28/2020   Procedure: CYSTOSCOPY;  Surgeon: Corene Coy, MD;  Location: [facility] [place];  Service: Gynecology;;   DILITATION & CURRETTAGE/HYSTROSCOPY WITH NOVASURE ABLATION N/A 08/23/2019   Procedure: DILATATION & CURETTAGE/DIAGNOSTIC HYSTEROSCOPY;  Surgeon: Corene Coy, MD;  Location: [facility];  Service: Gynecology;  Laterality: N/A;   IR GENERIC HISTORICAL  03/21/2016   IR EMBO TUMOR ORGAN ISCHEMIA INFARCT INC GUIDE ROADMAPPING 03/21/2016 Toribio Faes, MD [facility]   IR GENERIC HISTORICAL  03/21/2016   IR US  GUIDE VASC ACCESS RIGHT 03/21/2016 Toribio Faes, MD [facility]   IR GENERIC HISTORICAL  03/21/2016   IR ANGIOGRAM SELECTIVE EACH ADDITIONAL VESSEL 03/21/2016 Toribio Faes, MD [facility]   IR GENERIC HISTORICAL  03/21/2016   IR [facility] SELECTIVE EACH ADDITIONAL VESSEL 03/21/2016 Toribio Faes, MD [facility]   IR GENERIC HISTORICAL  03/21/2016   IR ANGIOGRAM PELVIS SELECTIVE OR SUPRASELECTIVE 03/21/2016 Toribio Faes, MD [facility]  IR GENERIC HISTORICAL  03/21/2016   IR ANGIOGRAM PELVIS SELECTIVE OR SUPRASELECTIVE 03/21/2016 Toribio Faes, MD [facility]   IR GENERIC HISTORICAL  02/27/2016   IR RADIOLOGIST EVAL & MGMT 02/27/2016 Toribio Faes, MD [facility]   IR GENERIC HISTORICAL  04/03/2016   IR RADIOLOGIST EVAL & MGMT 04/03/2016 Toribio Faes, MD [facility]   IR RADIOLOGIST EVAL & MGMT  08/26/2016   labial cyst removal     ROBOTIC ASSISTED TOTAL HYSTERECTOMY Bilateral 02/28/2020   Procedure: XI ROBOTIC ASSISTED TOTAL HYSTERECTOMY AND BILATERAL SALPINGECTOMY;  Surgeon: Corene Coy, MD;  Location: [facility] [place];  Service: Gynecology;  Laterality: Bilateral;   TUBAL LIGATION     20 years ago   UPPER GASTROINTESTINAL  ENDOSCOPY     Current Outpatient Medications on File Prior to Visit  Medication Sig Dispense Refill   albuterol  (VENTOLIN  HFA) 108 (90 Base) MCG/ACT inhaler INHALE 2 PUFFS INTO THE LUNGS EVERY 6 HOURS AS NEEDED FOR WHEEZE 18 each 2   aspirin  81 MG tablet Take 81 mg by mouth daily.     atenolol  (TENORMIN ) 25 MG tablet TAKE 1 TABLET (25 MG TOTAL) BY MOUTH DAILY. 90 tablet 0   b complex vitamins capsule Take 1 capsule by mouth daily.     Cholecalciferol (VITAMIN D ) 50 MCG (2000 UT) CAPS Take 1 capsule (2,000 Units total) by mouth daily. 90 capsule 3   clonazePAM  (KLONOPIN ) 0.5 MG tablet Take 0.5 mg by mouth daily as needed.     fluticasone  (FLONASE ) 50 MCG/ACT nasal spray SPRAY 2 SPRAYS INTO EACH NOSTRIL EVERY DAY 48 mL 1   gabapentin  (NEURONTIN ) 100 MG capsule Take 100 mg by mouth 3 (three) times daily.     Glucose Blood (BLOOD GLUCOSE TEST STRIPS) STRP Test once a day. Pt has a onetouch verio flex meter dx e11.9 100 strip 1   hyoscyamine  (LEVBID ) 0.375 MG 12 hr tablet Take 1 tablet (0.375 mg total) by mouth every 12 (twelve) hours as needed. 180 tablet 1   Lancets (ONETOUCH ULTRASOFT) lancets Test once a day. Dx E11.9 pt has onetouch verio flex meter 100 each 1   losartan  (COZAAR ) 25 MG tablet Take 1 tablet (25 mg total) by mouth daily. 90 tablet 2   magnesium  gluconate (MAGONATE) 500 MG tablet Take 500 mg by mouth 2 (two) times daily.     meclizine  (ANTIVERT ) 25 MG tablet Take 1 tablet (25 mg total) by mouth 2 (two) times daily as needed for dizziness. 30 tablet 1   meloxicam (MOBIC) 15 MG tablet 15 mg.     omeprazole  (PRILOSEC) 40 MG capsule TAKE 1 CAPSULE BY MOUTH TWICE A DAY 180 capsule 1   ondansetron  (ZOFRAN ) 4 MG tablet TAKE 1 TABLET BY MOUTH EVERY 8 HOURS AS NEEDED FOR NAUSEA AND VOMITING 30 tablet 0   promethazine  (PHENERGAN ) 25 MG tablet TAKE 1/2 OR 1 TABLET BY MOUTH EVERY 6 (SIX) HOURS AS NEEDED FOR NAUSEA OR VOMITING. 12 tablet 0   rosuvastatin  (CRESTOR ) 20 MG tablet TAKE 1 TABLET BY  MOUTH EVERY DAY 90 tablet 1   traMADol  (ULTRAM ) 50 MG tablet Take 50 mg by mouth every 6 (six) hours as needed.     venlafaxine  XR (EFFEXOR -XR) 75 MG 24 hr capsule Take 75 mg by mouth in the morning and at bedtime.     No current facility-administered medications on file prior to visit.   Allergies  Allergen Reactions   Percocet [Oxycodone -Acetaminophen ] Itching   Lamictal [Lamotrigine] Rash  Family History  Problem Relation Age of Onset   Diabetes Mother    Hypertension Mother    Deep vein thrombosis Mother    Cancer - Other Mother        peritoneal cancer   Hiatal hernia Mother    Diabetes Father    Chronic Renal Failure Father    Hypertension Sister    Deep vein thrombosis Sister    Diabetes Sister    Hiatal hernia Sister    Colon cancer Maternal Grandmother    Colon polyps Maternal Grandmother    Rectal cancer Neg Hx    Stomach cancer Neg Hx    Pancreatic cancer Neg Hx    Kidney disease Neg Hx    Liver disease Neg Hx       PE Today's Vitals   01/04/24 1059  BP: 124/80  Pulse: 98  Temp: 98.4 F (36.9 C)  TempSrc: Oral  SpO2: 99%  Weight: 229 lb (103.9 kg)  Height: 5' 7.5 (1.715 m)   Body mass index is 35.34 kg/m.  Physical Exam Vitals reviewed. Exam conducted with a chaperone present.  Constitutional:      General: She is not in acute distress.    Appearance: Normal appearance.  HENT:     Head: Normocephalic and atraumatic.     Nose: Nose normal.  Eyes:     Extraocular Movements: Extraocular movements intact.     Conjunctiva/sclera: Conjunctivae normal.  Neck:     Thyroid : No thyroid  mass, thyromegaly or thyroid  tenderness.  Pulmonary:     Effort: Pulmonary effort is normal.  Chest:     Chest wall: No mass or tenderness.  Breasts:    Right: Normal. No swelling, mass, nipple discharge or tenderness.     Left: Normal. No swelling, mass, nipple discharge or tenderness.  Abdominal:     General: There is no distension.     Palpations: Abdomen is  soft.     Tenderness: There is no abdominal tenderness.  Genitourinary:    General: Normal vulva.     Exam position: Lithotomy position.     Urethra: No prolapse.     Vagina: Normal. No vaginal discharge or bleeding.     Cervix: No lesion.     Adnexa: Right adnexa normal and left adnexa normal.     Comments: Cervix and uterus absent Musculoskeletal:        General: Normal range of motion.     Cervical back: Normal range of motion.  Lymphadenopathy:     Upper Body:     Right upper body: No axillary adenopathy.     Left upper body: No axillary adenopathy.     Lower Body: No right inguinal adenopathy. No left inguinal adenopathy.  Skin:    General: Skin is warm and dry.  Neurological:     General: No focal deficit present.     Mental Status: She is alert.  Psychiatric:        Mood and Affect: Mood normal.        Behavior: Behavior normal.      Assessment and Plan:        Well woman exam with routine gynecological exam Assessment & Plan: Cervical cancer screening no longer indicated Encouraged annual mammogram screening Colonoscopy UTD DXA UTD Labs and immunizations with her primary Encouraged safe sexual practices as indicated Encouraged healthy lifestyle practices with diet and exercise For patients under 50-70yo, I recommend 1200mg  calcium  daily and 600IU of vitamin D  daily.    History  of hysterectomy  Negative depression screening  Continue in therapy   Vera LULLA Pa, MD

## 2024-01-04 ENCOUNTER — Ambulatory Visit: Admitting: Obstetrics and Gynecology

## 2024-01-04 ENCOUNTER — Encounter: Payer: Self-pay | Admitting: Obstetrics and Gynecology

## 2024-01-04 VITALS — BP 124/80 | HR 98 | Temp 98.4°F | Ht 67.5 in | Wt 229.0 lb

## 2024-01-04 DIAGNOSIS — Z90710 Acquired absence of both cervix and uterus: Secondary | ICD-10-CM | POA: Diagnosis not present

## 2024-01-04 DIAGNOSIS — Z01419 Encounter for gynecological examination (general) (routine) without abnormal findings: Secondary | ICD-10-CM | POA: Insufficient documentation

## 2024-01-04 DIAGNOSIS — Z1331 Encounter for screening for depression: Secondary | ICD-10-CM | POA: Diagnosis not present

## 2024-01-04 NOTE — Patient Instructions (Signed)
 For patients under 50-63yo, I recommend 1200mg  calcium  daily and 600IU of vitamin D daily. For patients over 63yo, I recommend 1200mg  calcium  daily and 800IU of vitamin D daily.  Health Maintenance, Female Adopting a healthy lifestyle and getting preventive care are important in promoting health and wellness. Ask your health care provider about: The right schedule for you to have regular tests and exams. Things you can do on your own to prevent diseases and keep yourself healthy. What should I know about diet, weight, and exercise? Eat a healthy diet  Eat a diet that includes plenty of vegetables, fruits, low-fat dairy products, and lean protein. Do not eat a lot of foods that are high in solid fats, added sugars, or sodium. Maintain a healthy weight Body mass index (BMI) is used to identify weight problems. It estimates body fat based on height and weight. Your health care provider can help determine your BMI and help you achieve or maintain a healthy weight. Get regular exercise Get regular exercise. This is one of the most important things you can do for your health. Most adults should: Exercise for at least 150 minutes each week. The exercise should increase your heart rate and make you sweat (moderate-intensity exercise). Do strengthening exercises at least twice a week. This is in addition to the moderate-intensity exercise. Spend less time sitting. Even light physical activity can be beneficial. Watch cholesterol and blood lipids Have your blood tested for lipids and cholesterol at 63 years of age, then have this test every 5 years. Have your cholesterol levels checked more often if: Your lipid or cholesterol levels are high. You are older than 63 years of age. You are at high risk for heart disease. What should I know about cancer screening? Depending on your health history and family history, you may need to have cancer screening at various ages. This may include screening  for: Breast cancer. Cervical cancer. Colorectal cancer. Skin cancer. Lung cancer. What should I know about heart disease, diabetes, and high blood pressure? Blood pressure and heart disease High blood pressure causes heart disease and increases the risk of stroke. This is more likely to develop in people who have high blood pressure readings or are overweight. Have your blood pressure checked: Every 3-5 years if you are 25-57 years of age. Every year if you are 24 years old or older. Diabetes Have regular diabetes screenings. This checks your fasting blood sugar level. Have the screening done: Once every three years after age 62 if you are at a normal weight and have a low risk for diabetes. More often and at a younger age if you are overweight or have a high risk for diabetes. What should I know about preventing infection? Hepatitis B If you have a higher risk for hepatitis B, you should be screened for this virus. Talk with your health care provider to find out if you are at risk for hepatitis B infection. Hepatitis C Testing is recommended for: Everyone born from [birth year] through 1965. Anyone with known risk factors for hepatitis C. Sexually transmitted infections (STIs) Get screened for STIs, including gonorrhea and chlamydia, if: You are sexually active and are younger than 63 years of age. You are older than 63 years of age and your health care provider tells you that you are at risk for this type of infection. Your sexual activity has changed since you were last screened, and you are at increased risk for chlamydia or gonorrhea. Ask your health care provider if  you are at risk. Ask your health care provider about whether you are at high risk for HIV. Your health care provider may recommend a prescription medicine to help prevent HIV infection. If you choose to take medicine to prevent HIV, you should first get tested for HIV. You should then be tested every 3 months for as long as you  are taking the medicine. Osteoporosis and menopause Osteoporosis is a disease in which the bones lose minerals and strength with aging. This can result in bone fractures. If you are 72 years old or older, or if you are at risk for osteoporosis and fractures, ask your health care provider if you should: Be screened for bone loss. Take a calcium  or vitamin D supplement to lower your risk of fractures. Be given hormone replacement therapy (HRT) to treat symptoms of menopause. Follow these instructions at home: Alcohol use Do not drink alcohol if: Your health care provider tells you not to drink. You are pregnant, may be pregnant, or are planning to become pregnant. If you drink alcohol: Limit how much you have to: 0-1 drink a day. Know how much alcohol is in your drink. In the [place] one drink equals one 12 oz bottle of beer (355 mL), one 5 oz glass of wine (148 mL), or one 1 oz glass of hard liquor (44 mL). Lifestyle Do not use any products that contain nicotine or tobacco. These products include cigarettes, chewing tobacco, and vaping devices, such as e-cigarettes. If you need help quitting, ask your health care provider. Do not use street drugs. Do not share needles. Ask your health care provider for help if you need support or information about quitting drugs. General instructions Schedule regular health, dental, and eye exams. Stay current with your vaccines. Tell your health care provider if: You often feel depressed. You have ever been abused or do not feel safe at home. Summary Adopting a healthy lifestyle and getting preventive care are important in promoting health and wellness. Follow your health care provider's instructions about healthy diet, exercising, and getting tested or screened for diseases. Follow your health care provider's instructions on monitoring your cholesterol and blood pressure. This information is not intended to replace advice given to you by your health  care provider. Make sure you discuss any questions you have with your health care provider. Document Revised: 07/30/2020 Document Reviewed: 07/30/2020 Elsevier Patient Education  2024 [employer].

## 2024-01-04 NOTE — Assessment & Plan Note (Signed)
 Cervical cancer screening no longer indicated. Encouraged annual mammogram screening Colonoscopy UTD DXA UTD Labs and immunizations with her primary Encouraged safe sexual practices as indicated Encouraged healthy lifestyle practices with diet and exercise For patients under 50-63yo, I recommend 1200mg  calcium daily and 600IU of vitamin D  daily.

## 2024-02-08 ENCOUNTER — Other Ambulatory Visit: Payer: Self-pay | Admitting: Medical

## 2024-03-08 ENCOUNTER — Other Ambulatory Visit: Payer: Self-pay | Admitting: Medical

## 2024-03-11 ENCOUNTER — Other Ambulatory Visit: Payer: Self-pay | Admitting: Medical

## 2024-03-11 DIAGNOSIS — Z1231 Encounter for screening mammogram for malignant neoplasm of breast: Secondary | ICD-10-CM

## 2024-03-21 ENCOUNTER — Ambulatory Visit
Admission: RE | Admit: 2024-03-21 | Discharge: 2024-03-21 | Disposition: A | Source: Ambulatory Visit | Attending: Medical | Admitting: Medical

## 2024-03-21 DIAGNOSIS — Z1231 Encounter for screening mammogram for malignant neoplasm of breast: Secondary | ICD-10-CM

## 2024-03-31 ENCOUNTER — Other Ambulatory Visit: Payer: Self-pay | Admitting: Internal Medicine

## 2024-03-31 ENCOUNTER — Ambulatory Visit: Payer: Self-pay | Admitting: Medical

## 2024-03-31 DIAGNOSIS — Z111 Encounter for screening for respiratory tuberculosis: Secondary | ICD-10-CM

## 2024-04-01 ENCOUNTER — Other Ambulatory Visit

## 2024-04-01 DIAGNOSIS — Z111 Encounter for screening for respiratory tuberculosis: Secondary | ICD-10-CM

## 2024-04-05 ENCOUNTER — Ambulatory Visit: Payer: Self-pay | Admitting: Medical

## 2024-04-05 LAB — QUANTIFERON-TB GOLD PLUS
QuantiFERON Mitogen Value: >10 [IU]/mL
QuantiFERON Nil Value: 0.04 [IU]/mL
QuantiFERON TB1 Ag Value: 0.11 [IU]/mL
QuantiFERON TB2 Ag Value: 0.14 [IU]/mL

## 2024-04-05 NOTE — Progress Notes (Signed)
 Tuberculosis screen is normal

## 2024-08-02 ENCOUNTER — Ambulatory Visit: Payer: Self-pay

## 2024-11-23 ENCOUNTER — Encounter: Payer: Self-pay | Admitting: Medical
